# Patient Record
Sex: Female | Born: 1955
Health system: Southern US, Community
[De-identification: ages and names within clinical notes are randomized; demographics above are authoritative.]

## PROBLEM LIST (undated history)

## (undated) DIAGNOSIS — L409 Psoriasis, unspecified: Secondary | ICD-10-CM

## (undated) DIAGNOSIS — R319 Hematuria, unspecified: Secondary | ICD-10-CM

## (undated) DIAGNOSIS — I10 Essential (primary) hypertension: Secondary | ICD-10-CM

## (undated) DIAGNOSIS — I4891 Unspecified atrial fibrillation: Secondary | ICD-10-CM

## (undated) DIAGNOSIS — E669 Obesity, unspecified: Secondary | ICD-10-CM

## (undated) DIAGNOSIS — I499 Cardiac arrhythmia, unspecified: Secondary | ICD-10-CM

## (undated) DIAGNOSIS — C9590 Leukemia, unspecified not having achieved remission: Secondary | ICD-10-CM

## (undated) DIAGNOSIS — N649 Disorder of breast, unspecified: Secondary | ICD-10-CM

## (undated) DIAGNOSIS — E0590 Thyrotoxicosis, unspecified without thyrotoxic crisis or storm: Secondary | ICD-10-CM

## (undated) DIAGNOSIS — C50919 Malignant neoplasm of unspecified site of unspecified female breast: Secondary | ICD-10-CM

## (undated) DIAGNOSIS — R350 Frequency of micturition: Secondary | ICD-10-CM

## (undated) DIAGNOSIS — N950 Postmenopausal bleeding: Secondary | ICD-10-CM

## (undated) HISTORY — DX: Frequency of micturition: R35.0

## (undated) HISTORY — DX: Essential (primary) hypertension: I10

## (undated) HISTORY — DX: Disorder of breast, unspecified: N64.9

## (undated) HISTORY — DX: Malignant neoplasm of unspecified site of unspecified female breast: C50.919

## (undated) HISTORY — DX: Hematuria, unspecified: R31.9

## (undated) HISTORY — DX: Postmenopausal bleeding: N95.0

## (undated) HISTORY — DX: Obesity, unspecified: E66.9

---

## 2005-03-05 DIAGNOSIS — C50919 Malignant neoplasm of unspecified site of unspecified female breast: Secondary | ICD-10-CM

## 2005-03-05 HISTORY — DX: Malignant neoplasm of unspecified site of unspecified female breast: C50.919

## 2006-02-06 ENCOUNTER — Encounter: Admission: RE | Admit: 2006-02-06 | Discharge: 2006-02-06 | Payer: Self-pay | Admitting: General Surgery

## 2006-02-06 ENCOUNTER — Encounter (INDEPENDENT_AMBULATORY_CARE_PROVIDER_SITE_OTHER): Payer: Self-pay | Admitting: Diagnostic Radiology

## 2006-02-06 ENCOUNTER — Encounter (INDEPENDENT_AMBULATORY_CARE_PROVIDER_SITE_OTHER): Payer: Self-pay | Admitting: *Deleted

## 2006-02-13 ENCOUNTER — Ambulatory Visit (HOSPITAL_COMMUNITY): Admission: RE | Admit: 2006-02-13 | Discharge: 2006-02-13 | Payer: Self-pay | Admitting: General Surgery

## 2006-02-20 ENCOUNTER — Encounter (INDEPENDENT_AMBULATORY_CARE_PROVIDER_SITE_OTHER): Payer: Self-pay | Admitting: Specialist

## 2006-02-20 ENCOUNTER — Ambulatory Visit (HOSPITAL_COMMUNITY): Admission: RE | Admit: 2006-02-20 | Discharge: 2006-02-20 | Payer: Self-pay | Admitting: General Surgery

## 2006-02-20 HISTORY — PX: BREAST SURGERY: SHX581

## 2006-03-12 ENCOUNTER — Ambulatory Visit (HOSPITAL_COMMUNITY): Payer: Self-pay | Admitting: Oncology

## 2006-04-03 ENCOUNTER — Encounter: Admission: RE | Admit: 2006-04-03 | Discharge: 2006-04-03 | Payer: Self-pay | Admitting: Radiation Oncology

## 2006-04-05 ENCOUNTER — Ambulatory Visit: Admission: RE | Admit: 2006-04-05 | Discharge: 2006-07-04 | Payer: Self-pay | Admitting: Radiation Oncology

## 2006-06-18 ENCOUNTER — Ambulatory Visit (HOSPITAL_COMMUNITY): Payer: Self-pay | Admitting: Oncology

## 2006-11-27 ENCOUNTER — Other Ambulatory Visit: Admission: RE | Admit: 2006-11-27 | Discharge: 2006-11-27 | Payer: Self-pay | Admitting: Obstetrics and Gynecology

## 2007-01-13 ENCOUNTER — Encounter: Admission: RE | Admit: 2007-01-13 | Discharge: 2007-01-13 | Payer: Self-pay | Admitting: Family Medicine

## 2007-01-20 ENCOUNTER — Ambulatory Visit (HOSPITAL_COMMUNITY): Payer: Self-pay | Admitting: Oncology

## 2008-01-07 ENCOUNTER — Other Ambulatory Visit: Admission: RE | Admit: 2008-01-07 | Discharge: 2008-01-07 | Payer: Self-pay | Admitting: Obstetrics and Gynecology

## 2008-01-15 ENCOUNTER — Encounter: Admission: RE | Admit: 2008-01-15 | Discharge: 2008-01-15 | Payer: Self-pay | Admitting: Family Medicine

## 2008-02-09 ENCOUNTER — Ambulatory Visit (HOSPITAL_COMMUNITY): Payer: Self-pay | Admitting: Oncology

## 2009-01-21 ENCOUNTER — Encounter: Admission: RE | Admit: 2009-01-21 | Discharge: 2009-01-21 | Payer: Self-pay | Admitting: Family Medicine

## 2009-02-08 ENCOUNTER — Ambulatory Visit (HOSPITAL_COMMUNITY): Payer: Self-pay | Admitting: Oncology

## 2009-02-11 ENCOUNTER — Other Ambulatory Visit: Admission: RE | Admit: 2009-02-11 | Discharge: 2009-02-11 | Payer: Self-pay | Admitting: Obstetrics and Gynecology

## 2010-01-27 ENCOUNTER — Encounter: Admission: RE | Admit: 2010-01-27 | Discharge: 2010-01-27 | Payer: Self-pay | Admitting: Family Medicine

## 2010-02-17 ENCOUNTER — Encounter (HOSPITAL_COMMUNITY): Admission: RE | Admit: 2010-02-17 | Payer: Self-pay | Admitting: Oncology

## 2010-02-17 ENCOUNTER — Ambulatory Visit (HOSPITAL_COMMUNITY): Payer: Self-pay | Admitting: Oncology

## 2010-03-31 ENCOUNTER — Other Ambulatory Visit (HOSPITAL_COMMUNITY)
Admission: RE | Admit: 2010-03-31 | Payer: BC Managed Care – PPO | Source: Ambulatory Visit | Admitting: Obstetrics and Gynecology

## 2010-03-31 ENCOUNTER — Other Ambulatory Visit: Payer: Self-pay | Admitting: Adult Health

## 2010-04-07 ENCOUNTER — Other Ambulatory Visit (HOSPITAL_COMMUNITY)
Admission: RE | Admit: 2010-04-07 | Discharge: 2010-04-07 | Disposition: A | Discharge status: Home / Self Care | Payer: BC Managed Care – PPO | Source: Ambulatory Visit | Attending: Obstetrics and Gynecology | Admitting: Obstetrics and Gynecology

## 2010-04-07 DIAGNOSIS — Z01419 Encounter for gynecological examination (general) (routine) without abnormal findings: Secondary | ICD-10-CM | POA: Insufficient documentation

## 2010-07-21 NOTE — Op Note (Signed)
Kaitlyn Hull, Kaitlyn Hull                 ACCOUNT NO.:  [id number]   MEDICAL RECORD NO.:  [id number]          PATIENT TYPE:  AMB   LOCATION:  DAY                           FACILITY:  [facility]   PHYSICIAN:  Dalia Heading, M.D.  DATE OF BIRTH:  17-Jan-1956   DATE OF PROCEDURE:  02/20/2006  DATE OF DISCHARGE:                               OPERATIVE REPORT   PREOPERATIVE DIAGNOSIS:  Left breast carcinoma, ductal carcinoma in  situ.   POSTOPERATIVE DIAGNOSIS:  Left breast carcinoma, ductal carcinoma in  situ.   PROCEDURE:  Left partial mastectomy after needle localization, sentinel  lymph node biopsy, left breast injection.   SURGEON:  Dr. Franky Macho   ANESTHESIA:  General endotracheal.   INDICATIONS:  The patient is a 55 year old white female who has biopsy-  proven left breast carcinoma in the upper, inner quadrant left breast.  A single titanium clip was left to mark the site of the malignancy.  It  was not palpable on examination.  The patient comes to the operating  room for a left partial mastectomy, sentinel lymph node biopsy, possible  axillary dissection.  Risks and benefits of procedure including  bleeding, infection, pain, possibility of needing to re-excise the area  were fully explained to the patient, gave informed consent.   PROCEDURE NOTE:  The patient was placed in supine position.  After  induction of general endotracheal anesthesia, 5 mL of methylene blue was  instilled into the left breast around where the previous needle  localization had been performed.  It was massaged in place for  approximately 5 minutes.  The left breast, axilla were then prepped and  draped in the usual sterile technique with Betadine.  Surgical site  confirmation was performed.   A NeoProbe was used to locate the sentinel lymph node in the left  axilla.  Skin counts were at 450.  An incision was made over this  region.  The in vivo count was in the 400.  An ex vivo count was 1300  with a  blue node present.  Basin count was 8.  This lymph node was  inspected by the pathologist and touch prep was negative for malignancy.  Any bleeding was controlled using small clips.  The subcutaneous layer  was reapproximated using a 4-0 Vicryl interrupted suture.  Skin was  closed using a 4-0 Vicryl subcuticular suture.  0.5% Sensorcaine was  instilled in the surrounding wound.  Dermabond was then applied.   Next, a left partial mastectomy was performed.  An incision was made in  the area of the needle localization wire.  This was taken down to the  mass and a large portion of the upper, inner quadrant was excised.  The  specimen was sent for specimen radiography.  The titanium clip as well  as tumor was noted to be within the specimen removed.  This was then  sent to pathology for further examination.  Any bleeding was controlled  using Bovie electrocautery.  The subcutaneous layer was reapproximated  using a 4-0 Vicryl interrupted suture.  Skin was closed using a  4-0  Vicryl subcuticular suture.  0.5% Sensorcaine was instilled in the  surrounding wound.  Dermabond was then applied.   All tape and needle counts correct at the end of the procedures.  The  patient was extubated in the operating room and went back to recovery  room awake in stable condition.  Complications none.   SPECIMEN:  1. Sentinel lymph node.  2. Left breast tissue.   BLOOD LOSS:  Less than 50 mL      Dalia Heading, M.D.  Electronically Signed     MAJ/MEDQ  D:  02/20/2006  T:  02/20/2006  Job:  [id number]   cc:   Tilda Burrow, M.D.  Fax: [phone]   Wyvonnia Lora  Fax: [phone]

## 2010-07-21 NOTE — H&P (Signed)
NAME:  Kaitlyn Hull, Kaitlyn Hull                 ACCOUNT NO.:  [id number]   MEDICAL RECORD NO.:  [id number]          PATIENT TYPE:  AMB   LOCATION:  DAY                           FACILITY:  [facility]   PHYSICIAN:  Dalia Heading, M.D.  DATE OF BIRTH:  1955-11-05   DATE OF ADMISSION:  DATE OF DISCHARGE:  LH                              HISTORY & PHYSICAL   CHIEF COMPLAINT:  Ductal carcinoma in situ of left breast.   HISTORY OF PRESENT ILLNESS:  The patient is a 55 year old white female  status post multiple breast biopsies in the past who was found to have  microcalcifications on routine mammography.  A biopsy of this reveals  ductal carcinoma in situ.  MRI of the breast reveals a 1.6 x 2.4 x 3 cm  in the upper, inner quadrant.  There is no history of nipple discharge  or family history of breast carcinoma.   PAST MEDICAL HISTORY:  1. Hypertension.  2. Non-insulin-dependent diabetes mellitus.   PAST SURGICAL HISTORY:  1. C-section.  2. Multiple breast biopsies.   CURRENT MEDICATIONS:  1. Blood pressure pill.  2. Diabetic pill.   ALLERGIES:  NO KNOWN DRUG ALLERGIES.   REVIEW OF SYSTEMS:  Noncontributory.   PHYSICAL EXAMINATION:  GENERAL:  The patient is a well-developed, well-  nourished white female in no acute distress.  NECK:  Supple without lymphadenopathy.  LUNGS:  Clear to auscultation with equal breath sounds bilaterally.  HEART:  Examination reveals regular rate and rhythm without history, S4,  murmurs.  BREASTS:  Right breast examination reveals no dominant mass, nipple  discharge, dimpling.  The axilla is negative for palpable nodes.  Left  breast examination reveals no dominant mass, nipple discharge, dimpling.  The axilla is negative for palpable nodes.   IMPRESSION:  Ductal carcinoma in situ, left breast.   PLAN:  The patient is scheduled for a left partial mastectomy after  needle localization, sentinel lymph node biopsy, possible axillary  dissection on February 20, 2006.   Risks and benefits of the procedure  including bleeding, infection, arm swelling, and the possibility of  needing re-excision of the breast were fully explained to the patient,  gave informed consent.      Dalia Heading, M.D.  Electronically Signed     MAJ/MEDQ  D:  02/14/2006  T:  02/15/2006  Job:  [id number]   cc:   Short Stay at [facility]   Tilda Burrow, M.D.  Fax: [phone]   Wyvonnia Lora  Fax: [phone]

## 2011-01-18 ENCOUNTER — Other Ambulatory Visit: Payer: Self-pay | Admitting: Family Medicine

## 2011-01-18 DIAGNOSIS — Z9889 Other specified postprocedural states: Secondary | ICD-10-CM

## 2011-02-02 ENCOUNTER — Ambulatory Visit
Admission: RE | Admit: 2011-02-02 | Discharge: 2011-02-02 | Disposition: A | Discharge status: Home / Self Care | Payer: BC Managed Care – PPO | Source: Ambulatory Visit | Attending: Family Medicine | Admitting: Family Medicine

## 2011-02-02 DIAGNOSIS — Z9889 Other specified postprocedural states: Secondary | ICD-10-CM

## 2011-04-03 ENCOUNTER — Other Ambulatory Visit: Payer: Self-pay | Admitting: Adult Health

## 2011-04-03 ENCOUNTER — Other Ambulatory Visit (HOSPITAL_COMMUNITY)
Admission: RE | Admit: 2011-04-03 | Discharge: 2011-04-03 | Disposition: A | Discharge status: Home / Self Care | Payer: 59 | Source: Ambulatory Visit | Attending: Obstetrics and Gynecology | Admitting: Obstetrics and Gynecology

## 2011-04-03 DIAGNOSIS — Z01419 Encounter for gynecological examination (general) (routine) without abnormal findings: Secondary | ICD-10-CM | POA: Insufficient documentation

## 2011-10-02 ENCOUNTER — Emergency Department (HOSPITAL_COMMUNITY)
Admission: EM | Admit: 2011-10-02 | Discharge: 2011-10-02 | Disposition: A | Discharge status: Home / Self Care | Payer: 59 | Attending: Emergency Medicine | Admitting: Emergency Medicine

## 2011-10-02 ENCOUNTER — Emergency Department (HOSPITAL_COMMUNITY): Payer: 59

## 2011-10-02 ENCOUNTER — Encounter (HOSPITAL_COMMUNITY): Payer: Self-pay

## 2011-10-02 DIAGNOSIS — R10811 Right upper quadrant abdominal tenderness: Secondary | ICD-10-CM | POA: Insufficient documentation

## 2011-10-02 DIAGNOSIS — E119 Type 2 diabetes mellitus without complications: Secondary | ICD-10-CM | POA: Insufficient documentation

## 2011-10-02 DIAGNOSIS — R109 Unspecified abdominal pain: Secondary | ICD-10-CM | POA: Insufficient documentation

## 2011-10-02 LAB — URINALYSIS, ROUTINE W REFLEX MICROSCOPIC
Glucose, UA: NEGATIVE mg/dL
Leukocytes, UA: NEGATIVE
Protein, ur: NEGATIVE mg/dL
Specific Gravity, Urine: 1.01 (ref 1.005–1.030)
pH: 7 (ref 5.0–8.0)

## 2011-10-02 LAB — COMPREHENSIVE METABOLIC PANEL
ALT: 30 U/L (ref 0–35)
AST: 44 U/L — ABNORMAL HIGH (ref 0–37)
CO2: 26 mEq/L (ref 19–32)
Chloride: 102 mEq/L (ref 96–112)
Creatinine, Ser: 0.8 mg/dL (ref 0.50–1.10)
GFR calc non Af Amer: 82 mL/min — ABNORMAL LOW (ref >90)
Sodium: 139 mEq/L (ref 135–145)
Total Bilirubin: 0.9 mg/dL (ref 0.3–1.2)

## 2011-10-02 LAB — CBC WITH DIFFERENTIAL/PLATELET
Basophils Absolute: 0 10*3/uL (ref 0.0–0.1)
HCT: 38.6 % (ref 36.0–46.0)
Lymphocytes Relative: 20 % (ref 12–46)
Monocytes Absolute: 0.7 10*3/uL (ref 0.1–1.0)
Neutro Abs: 6.9 10*3/uL (ref 1.7–7.7)
RBC: 4.26 MIL/uL (ref 3.87–5.11)
RDW: 14 % (ref 11.5–15.5)
WBC: 9.7 10*3/uL (ref 4.0–10.5)

## 2011-10-02 LAB — PREGNANCY, URINE: Preg Test, Ur: NEGATIVE

## 2011-10-02 MED ORDER — ONDANSETRON HCL 4 MG/2ML IJ SOLN
4.0000 mg | Freq: Once | INTRAMUSCULAR | Status: AC
  Administered 2011-10-02: 4 mg via INTRAVENOUS
  Filled 2011-10-02: qty 2

## 2011-10-02 MED ORDER — HYDROMORPHONE HCL PF 1 MG/ML IJ SOLN
1.0000 mg | Freq: Once | INTRAMUSCULAR | Status: AC
  Administered 2011-10-02: 1 mg via INTRAVENOUS
  Filled 2011-10-02: qty 1

## 2011-10-02 MED ORDER — OXYCODONE-ACETAMINOPHEN 5-325 MG PO TABS: 1.0000 | ORAL_TABLET | ORAL | Status: AC | PRN

## 2011-10-02 NOTE — ED Notes (Signed)
Patient transported to Ultrasound 

## 2011-10-02 NOTE — ED Provider Notes (Signed)
History  This chart was scribed for Joya Gaskins, MD by Bennett Scrape. This patient was seen in room [facility]08/[facility]08 and the patient's care was started at 10:53AM.  CSN: [id number]  Arrival date & time 10/02/11  1034   First MD Initiated Contact with Patient 10/02/11 1053      Chief Complaint  Patient presents with  . Abdominal Pain     Patient is a 56 y.o. female presenting with abdominal pain. The history is provided by the patient. No language interpreter was used.  Abdominal Pain The primary symptoms of the illness include abdominal pain. The primary symptoms of the illness do not include fever, shortness of breath, nausea, vomiting, diarrhea, dysuria or vaginal bleeding. The current episode started 13 to 24 hours ago. The onset of the illness was sudden. The problem has been gradually worsening.  The abdominal pain is located in the RUQ. The abdominal pain does not radiate.  Symptoms associated with the illness do not include chills or back pain. Significant associated medical issues do not include GERD, inflammatory bowel disease or diverticulitis.     Kaitlyn Hull is a 56 y.o. female who presents to the Emergency Department complaining of 24 hours of sudden onset, gradually worsening, constant RUQ abdominal pain described as sharp. The pain is non-radiating and she denies having any modifying factors. Pt denies having prior episodes of similar symptoms.She denies difficulty urinating, nausea, emesis, fevers, vaginal bleeding, weakness and numbness as associated symptoms. She denies having menstrual cycles due to having completed menapause. She has a h/o DM. She denies smoking and alcohol use.   Past Medical History  Diagnosis Date  . Diabetes mellitus     Type II    Past Surgical History  Procedure Date  . Breast surgery-lumpectomy       left side     Family History  Problem Relation Age of Onset  . Heart failure Father   . Cancer Sister     History  Substance  Use Topics  . Smoking status: Never Smoker   . Smokeless tobacco: Never Used  . Alcohol Use: No      Review of Systems  Constitutional: Negative for fever and chills.  Respiratory: Negative for cough and shortness of breath.   Gastrointestinal: Positive for abdominal pain. Negative for nausea, vomiting and diarrhea.  Genitourinary: Negative for dysuria, vaginal bleeding and difficulty urinating.  Musculoskeletal: Negative for back pain.  Neurological: Negative for weakness and numbness.  All other systems reviewed and are negative.    Allergies  Review of patient's allergies indicates no known allergies.  Home Medications   Current Outpatient Rx  Name Route Sig Dispense Refill  . ASPIRIN EC 81 MG PO TBEC Oral Take 81 mg by mouth at bedtime.    Marland Kitchen METFORMIN HCL ER 500 MG PO TB24 Oral Take 500 mg by mouth 2 (two) times daily.    Marland Kitchen PRAVASTATIN SODIUM 40 MG PO TABS Oral Take 40 mg by mouth at bedtime.    . TRIAMCINOLONE ACETONIDE 0.1 % EX CREA Topical Apply 1 application topically 2 (two) times daily as needed. For Psoriasis    . TRIAMTERENE-HCTZ 37.5-25 MG PO TABS Oral Take 1 tablet by mouth every morning.      Triage Vitals: BP 130/73  Pulse 106  Temp 98.3 F (36.8 C) (Oral)  Resp 18  SpO2 94%  Physical Exam  Nursing note and vitals reviewed.  CONSTITUTIONAL: Well developed/well nourished HEAD AND FACE: Normocephalic/atraumatic EYES: EOMI/PERRL ENMT: Mucous  membranes moist NECK: supple no meningeal signs SPINE:entire spine nontender CV: S1/S2 noted, no murmurs/rubs/gallops noted LUNGS: Lungs are clear to auscultation bilaterally, no apparent distress ABDOMEN: soft, RUQ tenderness, no rebound or guarding.  She is obese GU:no cva tenderness NEURO: Pt is awake/alert, moves all extremitiesx4 EXTREMITIES: pulses normal, full ROM SKIN: warm, color normal PSYCH: no abnormalities of mood noted  ED Course  Procedures   DIAGNOSTIC STUDIES: Oxygen Saturation is 94% on  room air, adequate by my interpretation.    COORDINATION OF CARE: 11:13AM-Discussed treatment plan which includes urinalysis and blood work with pt at bedside and pt agreed to plan.   2:08 PM Pt with continued RUQ tenderness, US imaging ordered  3:18 PM Pt improved.  She is taking PO.  Vitals improved.   Her pain appeared only in RUQ.  At this time, suspicion for acute appendicitis is low.  I doubt acute gynecologic emergency.  However, I advised to return if her symptoms should worsen including:  The pain does not go away or becomes severe, particularly over the next 8-12 hours.  A temperature above 100.78F develops.  Repeated vomiting occurs (multiple episodes).  The pain becomes localized to portions of the abdomen. The right side could possibly be appendicitis.   Patient agreeable with this plan.  She is nontoxic/well appearing.   Advised need for f/u MRI/CT scan of the liver abnormality, pt agreeable with this plan and she has PCP who can monitor this as outpatient    MDM  Nursing notes including past medical history and social history reviewed and considered in documentation Labs/vital reviewed and considered     Date: 10/02/2011  Rate: 93  Rhythm: normal sinus rhythm  QRS Axis: normal  Intervals: normal  ST/T Wave abnormalities: nonspecific ST changes  Conduction Disutrbances:none      I personally performed the services described in this documentation, which was scribed in my presence. The recorded information has been reviewed and considered.       Joya Gaskins, MD 10/02/11 [phone]

## 2011-10-02 NOTE — ED Notes (Signed)
Discharge instructions reviewed.

## 2011-10-02 NOTE — ED Notes (Signed)
Pt c/o later right sided abd pain x 1 day. States pain is localized. Pt says she couldn't sleep well last night and that the pain is getting progressively worse. Pt denies difficulty urinating. Last BM this am normal.

## 2012-01-07 ENCOUNTER — Other Ambulatory Visit: Payer: Self-pay | Admitting: Family Medicine

## 2012-01-07 DIAGNOSIS — Z853 Personal history of malignant neoplasm of breast: Secondary | ICD-10-CM

## 2012-01-07 DIAGNOSIS — Z9889 Other specified postprocedural states: Secondary | ICD-10-CM

## 2012-02-08 ENCOUNTER — Ambulatory Visit
Admission: RE | Admit: 2012-02-08 | Discharge: 2012-02-08 | Disposition: A | Discharge status: Home / Self Care | Payer: 59 | Source: Ambulatory Visit | Attending: Family Medicine | Admitting: Family Medicine

## 2012-02-08 DIAGNOSIS — Z853 Personal history of malignant neoplasm of breast: Secondary | ICD-10-CM

## 2012-02-08 DIAGNOSIS — Z9889 Other specified postprocedural states: Secondary | ICD-10-CM

## 2012-07-22 ENCOUNTER — Telehealth: Payer: Self-pay | Admitting: Adult Health

## 2012-07-22 NOTE — Telephone Encounter (Signed)
Has had bleeding, will see Thursday as scheduled

## 2012-07-22 NOTE — Telephone Encounter (Signed)
Pt states stopped having periods x 7 years ago due to radiation for breast cancer, started bleeding heavy at times x 2 weeks ago. Pt has an appointment in July for physical. Appointment made for pt to see Cyril Mourning, NP on Thursday at 2:45.

## 2012-07-23 ENCOUNTER — Ambulatory Visit: Payer: Self-pay | Admitting: Adult Health

## 2012-07-23 ENCOUNTER — Encounter: Payer: Self-pay | Admitting: *Deleted

## 2012-07-24 ENCOUNTER — Encounter: Payer: Self-pay | Admitting: Obstetrics and Gynecology

## 2012-07-24 ENCOUNTER — Other Ambulatory Visit: Payer: Self-pay | Admitting: Adult Health

## 2012-07-24 ENCOUNTER — Ambulatory Visit (INDEPENDENT_AMBULATORY_CARE_PROVIDER_SITE_OTHER): Payer: 59

## 2012-07-24 ENCOUNTER — Encounter: Payer: Self-pay | Admitting: Obstetrics & Gynecology

## 2012-07-24 ENCOUNTER — Encounter: Payer: Self-pay | Admitting: Adult Health

## 2012-07-24 ENCOUNTER — Ambulatory Visit (INDEPENDENT_AMBULATORY_CARE_PROVIDER_SITE_OTHER): Payer: 59 | Admitting: Adult Health

## 2012-07-24 VITALS — BP 140/80 | Ht 62.0 in | Wt 324.0 lb

## 2012-07-24 DIAGNOSIS — Z853 Personal history of malignant neoplasm of breast: Secondary | ICD-10-CM

## 2012-07-24 DIAGNOSIS — N950 Postmenopausal bleeding: Secondary | ICD-10-CM

## 2012-07-24 DIAGNOSIS — E669 Obesity, unspecified: Secondary | ICD-10-CM

## 2012-07-24 DIAGNOSIS — E119 Type 2 diabetes mellitus without complications: Secondary | ICD-10-CM

## 2012-07-24 DIAGNOSIS — I10 Essential (primary) hypertension: Secondary | ICD-10-CM | POA: Insufficient documentation

## 2012-07-24 HISTORY — DX: Postmenopausal bleeding: N95.0

## 2012-07-24 NOTE — Patient Instructions (Addendum)
Review endo bx handout Return 5 days for endo bx

## 2012-07-24 NOTE — Progress Notes (Signed)
Subjective:     Patient ID: Kaitlyn Hull, female   DOB: 1955/10/01, 57 y.o.   MRN: [id number]  HPI Kaitlyn Hull is a 57 year old white female in complaining of vaginal bleeding x 2 weeks, heavier since Tuesday. She has a history of breast cancer.  Review of Systems Positives as in HPI Reviewed past medical,surgical, social and family history. Reviewed medications and allergies.     Objective:   Physical Exam Blood pressure 140/80, height 5\' 2"  (1.575 m), weight 324 lb (146.965 kg).   Skin warm and dry.Pelvic: external genitalia is normal in appearance, vagina: + blood, cervix:smooth and bulbous, uterus: normal size, shape and contour, non tender, no masses felt, adnexa: no masses or tenderness noted. Difficult exam secondary to abdominal girth. Will get pelvic US today, and it showed 16.102mm thickened endometrium and fibroids. Will get endo bx asap. Assessment:      PMB with thickened endometrium History of breast cancer Obesity Diabetes Hypertension     Plan:      Endometrial biopsy 5/27 at 4 pm with Dr. Despina Hidden Review endo bx handout and call with any questions

## 2012-07-29 ENCOUNTER — Other Ambulatory Visit: Payer: 59 | Admitting: Obstetrics & Gynecology

## 2012-07-30 ENCOUNTER — Other Ambulatory Visit: Payer: Self-pay | Admitting: Obstetrics & Gynecology

## 2012-07-30 ENCOUNTER — Ambulatory Visit (INDEPENDENT_AMBULATORY_CARE_PROVIDER_SITE_OTHER): Payer: 59 | Admitting: Obstetrics & Gynecology

## 2012-07-30 ENCOUNTER — Encounter: Payer: Self-pay | Admitting: Obstetrics & Gynecology

## 2012-07-30 VITALS — BP 110/80 | Ht 62.0 in | Wt 324.0 lb

## 2012-07-30 DIAGNOSIS — N950 Postmenopausal bleeding: Secondary | ICD-10-CM

## 2012-07-30 DIAGNOSIS — Z853 Personal history of malignant neoplasm of breast: Secondary | ICD-10-CM

## 2012-07-30 DIAGNOSIS — R9389 Abnormal findings on diagnostic imaging of other specified body structures: Secondary | ICD-10-CM

## 2012-07-30 DIAGNOSIS — D259 Leiomyoma of uterus, unspecified: Secondary | ICD-10-CM

## 2012-07-30 DIAGNOSIS — C541 Malignant neoplasm of endometrium: Secondary | ICD-10-CM

## 2012-07-30 NOTE — Progress Notes (Signed)
Patient ID: Kaitlyn Hull, female   DOB: [birth year], 56 y.o.   MRN: [id number] Patient with no vaginal bleeding for several years then began. Sonogram measure endometrium at 16.1 mm.  Here for endometrial biopsy tody and patient aware of the indications. If endometrial cancer is found no formal uterine curettage needed but if not will need hysteroscopy and uterine curettage for more thorough endometrial evluation given the endometrial measurement.  Patient aware of the clinical situation and indications as listed bove.  Endometrial biopsy performed using 3mm pipelle without difficulty.  Tissue sent to pathology.  Follow up 1 week.

## 2012-08-06 ENCOUNTER — Encounter: Payer: Self-pay | Admitting: Obstetrics & Gynecology

## 2012-08-06 ENCOUNTER — Ambulatory Visit (INDEPENDENT_AMBULATORY_CARE_PROVIDER_SITE_OTHER): Payer: 59 | Admitting: Obstetrics & Gynecology

## 2012-08-06 VITALS — BP 104/78 | Wt 323.0 lb

## 2012-08-06 DIAGNOSIS — N950 Postmenopausal bleeding: Secondary | ICD-10-CM

## 2012-08-06 DIAGNOSIS — D259 Leiomyoma of uterus, unspecified: Secondary | ICD-10-CM

## 2012-08-06 NOTE — Progress Notes (Signed)
Patient ID: Kaitlyn Hull, female   DOB: 11/27/1955, 58 y.o.   MRN: [id number] Endometrial biopsy is negative.  Her ES is 16 mm which will require a formal hysteroscopic evaluation with uterine curettage  Will plan for that on 6.11.2014  [name] H

## 2012-08-07 ENCOUNTER — Encounter (HOSPITAL_COMMUNITY): Payer: Self-pay | Admitting: Pharmacy Technician

## 2012-08-08 ENCOUNTER — Encounter (HOSPITAL_COMMUNITY): Payer: Self-pay

## 2012-08-08 ENCOUNTER — Ambulatory Visit: Payer: 59 | Admitting: Obstetrics & Gynecology

## 2012-08-08 ENCOUNTER — Encounter (HOSPITAL_COMMUNITY)
Admission: RE | Admit: 2012-08-08 | Discharge: 2012-08-08 | Disposition: A | Discharge status: Home / Self Care | Payer: 59 | Source: Ambulatory Visit | Attending: Obstetrics & Gynecology | Admitting: Obstetrics & Gynecology

## 2012-08-08 HISTORY — DX: Psoriasis, unspecified: L40.9

## 2012-08-08 LAB — COMPREHENSIVE METABOLIC PANEL
ALT: 25 U/L (ref 0–35)
BUN: 12 mg/dL (ref 6–23)
CO2: 26 mEq/L (ref 19–32)
Calcium: 9.6 mg/dL (ref 8.4–10.5)
Creatinine, Ser: 0.92 mg/dL (ref 0.50–1.10)
GFR calc Af Amer: 80 mL/min — ABNORMAL LOW (ref >90)
GFR calc non Af Amer: 69 mL/min — ABNORMAL LOW (ref >90)
Glucose, Bld: 126 mg/dL — ABNORMAL HIGH (ref 70–99)
Sodium: 140 mEq/L (ref 135–145)

## 2012-08-08 LAB — CBC
HCT: 39.1 % (ref 36.0–46.0)
Hemoglobin: 12.8 g/dL (ref 12.0–15.0)
MCH: 29.9 pg (ref 26.0–34.0)
MCHC: 32.7 g/dL (ref 30.0–36.0)
MCV: 91.4 fL (ref 78.0–100.0)
RBC: 4.28 MIL/uL (ref 3.87–5.11)

## 2012-08-08 NOTE — Patient Instructions (Addendum)
Kaitlyn Hull  08/08/2012   Your procedure is scheduled on:  Wednesday, 08/13/12  [facility] to [facility] [place] [facility].  Call this number if you have problems the morning of surgery: [phone]   Remember:   Do not eat food or drink liquids after midnight.   Take these medicines the morning of surgery with A SIP OF WATER: lisinopril   Do not wear jewelry, make-up or nail polish.  Do not wear lotions, powders, or perfumes. You may wear deodorant.  Do not shave 48 hours prior to surgery. Men may shave face and neck.  Do not bring valuables to the hospital.  [facility] is not responsible                   for any belongings or valuables.  Contacts, dentures or bridgework may not be worn into surgery.  Leave suitcase in the car. After surgery it may be brought to your room.  For patients admitted to the hospital, checkout time is 11:00 AM the day of  discharge.   Patients discharged the day of surgery will not be allowed to drive  home.  Name and phone number of your driver: family  Special Instructions: Shower using CHG 2 nights before surgery and the night before surgery.  If you shower the day of surgery use CHG.  Use special wash - you have one bottle of CHG for all showers.  You should use approximately 1/3 of the bottle for each shower.   Please read over the following fact sheets that you were given: Pain Booklet, MRSA Information, Surgical Site Infection Prevention, Anesthesia Post-op Instructions and Care and Recovery After Surgery   PATIENT INSTRUCTIONS POST-ANESTHESIA  IMMEDIATELY FOLLOWING SURGERY:  Do not drive or operate machinery for the first twenty four hours after surgery.  Do not make any important decisions for twenty four hours after surgery or while taking narcotic pain medications or sedatives.  If you develop intractable nausea and vomiting or a severe headache please notify your doctor immediately.  FOLLOW-UP:  Please make an appointment with your surgeon as instructed.  You do not need to follow up with anesthesia unless specifically instructed to do so.  WOUND CARE INSTRUCTIONS (if applicable):  Keep a dry clean dressing on the anesthesia/puncture wound site if there is drainage.  Once the wound has quit draining you may leave it open to air.  Generally you should leave the bandage intact for twenty four hours unless there is drainage.  If the epidural site drains for more than 36-48 hours please call the anesthesia department.  QUESTIONS?:  Please feel free to call your physician or the hospital operator if you have any questions, and they will be happy to assist you.      Dilation and Curettage or Vacuum Curettage Care After These instructions give you information on caring for yourself after your procedure. Your doctor may also give you more specific instructions. Call your doctor if you have any problems or questions after your procedure. HOME CARE  Do not drive for 24 hours.  Return to normal activities in 1 week.  Take your temperature 2 times a day for 4 days. Write it down. Tell your doctor if you have a fever.  Do not stand for a long time.  Do not lift, push, or pull anything over 10 pounds (4.5 kilograms).  Limit stair climbing to once or twice a day.  Rest often.  Continue with your usual diet.  Drink  enough fluids to keep your pee (urine) clear or pale yellow.  If you have dry, hard poop (constipation), you may:  Take a medicine to help you go poop (laxative) as told by your doctor.  Add fruit and bran to your diet.  Take showers, not baths, for as long as told by your doctor.  Do not swim or use a hot tub until your doctor says it is okay.  Have someone with you for 2 to 4 days after the procedure.  Do not douche, use tampons, or have sex (intercourse) until your doctor says it is okay.  Only take medicines as told by your doctor. Do not take aspirin. It can cause bleeding.  Keep all doctor visits. GET HELP RIGHT AWAY  IF:   You start to bleed more than a regular period.  You have a fever.  You have chest pain.  You have trouble breathing.  You feel dizzy or feel like passing out (fainting).  You pass out.  You have pain in the tops of your shoulders.  You have vaginal bleeding with or without clumps (clots) of blood.  You have cramps or pain not helped by medicine.  You have belly (abdominal) pain.  You have a bad smelling fluid coming from your vagina.  You get a rash.  You have problems with any medicine. MAKE SURE YOU:   Understand these instructions.  Will watch your condition.  Will get help right away if you are not doing well or get worse. Document Released: 11/29/2007 Document Revised: 05/14/2011 Document Reviewed: 11/29/2007 [facility] Patient Information 2014 [place], [place]. Dilation and Curettage or Vacuum Curettage Dilation and curettage (D&C) and vacuum curettage are minor procedures. A D&C involves stretching (dilation) the cervix and scraping (curettage) the inside lining of the womb (uterus). During a D&C, tissue is gently scraped from the inside lining of the uterus. During a vacuum curettage, the lining and tissue in the uterus are removed with the use of gentle suction. Curettage may be performed for diagnostic or therapeutic purposes. As a diagnostic procedure, curettage is performed for the purpose of examining tissues from the uterus. Tissue examination may help determine causes or treatment options for symptoms. A diagnostic curettage may be performed for the following symptoms:  Irregular bleeding in the uterus.  Bleeding with the development of clots.  Spotting between menstrual periods.  Prolonged menstrual periods.  Bleeding after menopause.  No menstrual period (amenorrhea).  A change in size and shape of the uterus. A therapeutic curettage is performed to remove tissue, blood, or a contraceptive device. Therapeutic curettage may be performed for the  following conditions:   Removal of an IUD (intrauterine device).  Removal of retained placenta after giving birth. Retained placenta can cause bleeding severe enough to require transfusions or an infection.  Abortion.  Miscarriage.  Removal of polyps inside the uterus.  Removal of uncommon types of fibroids (noncancerous lumps). LET YOUR CAREGIVER KNOW ABOUT:   Allergies to food or medicine.  Medicines taken, including vitamins, herbs, eyedrops, over-the-counter medicines, and creams.  Use of steroids (by mouth or creams).  Previous problems with anesthetics or numbing medicines.  History of bleeding problems or blood clots.  Previous surgery.  Other health problems, including diabetes and kidney problems.  Possibility of pregnancy, if this applies. RISKS AND COMPLICATIONS   Excessive bleeding.  Infection of the uterus.  Damage to the cervix.  Development of scar tissue (adhesions) inside the uterus, later causing abnormal amounts of menstrual bleeding.  Complications from the general anesthetic, if a general anesthetic is used.  Putting a hole (perforation) in the uterus. This is rare. BEFORE THE PROCEDURE   Eat and drink before the procedure only as directed by your caregiver.  Arrange for someone to take you home. PROCEDURE   This procedure may be done in a hospital, outpatient clinic, or caregiver's office.  You may be given a general anesthetic or a local anesthetic in and around the cervix.  You will lie on your back with your legs in stirrups.  There are two ways in which your cervix can be softened and dilated. These include:  Taking a medicine.  Having thin rods (laminaria) inserted into your cervix.  A curved tool (curette) will scrape cells from the inside lining of the uterus and will then be removed. This procedure usually takes about 15 to 30 minutes. AFTER THE PROCEDURE   You will rest in the recovery area until you are stable and are  ready to go home.  You will need to have someone take you home.  You may feel sick to your stomach (nauseous) or throw up (vomit) if you had general anesthesia.  You may have a sore throat if a tube was placed in your throat during general anesthesia.  You may have light cramping and bleeding for 2 days to 2 weeks after the procedure.  Your uterus needs to make a new lining after the procedure. This may make your next period late. Document Released: 02/19/2005 Document Revised: 05/14/2011 Document Reviewed: 09/17/2008 [facility] Patient Information 2014 [place], [place].

## 2012-08-08 NOTE — Progress Notes (Signed)
08/08/12 1351  OBSTRUCTIVE SLEEP APNEA  Have you ever been diagnosed with sleep apnea through a sleep study? No  Do you snore loudly (loud enough to be heard through closed doors)?  0  Do you often feel tired, fatigued, or sleepy during the daytime? 0  Has anyone observed you stop breathing during your sleep? 0  Do you have, or are you being treated for high blood pressure? 1  BMI more than 35 kg/m2? 1  Age over 56 years old? 1  Neck circumference greater than 40 cm/18 inches? 1  Gender: 0  Obstructive Sleep Apnea Score 4  Score 4 or greater  Results sent to PCP

## 2012-08-13 ENCOUNTER — Ambulatory Visit (HOSPITAL_COMMUNITY)
Admission: RE | Admit: 2012-08-13 | Discharge: 2012-08-13 | Disposition: A | Discharge status: Home / Self Care | Payer: 59 | Source: Ambulatory Visit | Attending: Obstetrics & Gynecology | Admitting: Obstetrics & Gynecology

## 2012-08-13 ENCOUNTER — Ambulatory Visit (HOSPITAL_COMMUNITY): Payer: 59 | Admitting: Anesthesiology

## 2012-08-13 ENCOUNTER — Encounter (HOSPITAL_COMMUNITY)
Admission: RE | Disposition: A | Discharge status: Home / Self Care | Payer: Self-pay | Source: Ambulatory Visit | Attending: Obstetrics & Gynecology

## 2012-08-13 ENCOUNTER — Encounter (HOSPITAL_COMMUNITY): Payer: Self-pay | Admitting: *Deleted

## 2012-08-13 ENCOUNTER — Encounter (HOSPITAL_COMMUNITY): Payer: Self-pay | Admitting: Anesthesiology

## 2012-08-13 DIAGNOSIS — R9389 Abnormal findings on diagnostic imaging of other specified body structures: Secondary | ICD-10-CM

## 2012-08-13 DIAGNOSIS — E119 Type 2 diabetes mellitus without complications: Secondary | ICD-10-CM | POA: Insufficient documentation

## 2012-08-13 DIAGNOSIS — I10 Essential (primary) hypertension: Secondary | ICD-10-CM | POA: Insufficient documentation

## 2012-08-13 DIAGNOSIS — Z6841 Body Mass Index (BMI) 40.0 and over, adult: Secondary | ICD-10-CM | POA: Insufficient documentation

## 2012-08-13 DIAGNOSIS — N950 Postmenopausal bleeding: Secondary | ICD-10-CM | POA: Insufficient documentation

## 2012-08-13 DIAGNOSIS — Z01812 Encounter for preprocedural laboratory examination: Secondary | ICD-10-CM | POA: Insufficient documentation

## 2012-08-13 HISTORY — PX: HYSTEROSCOPY WITH D & C: SHX1775

## 2012-08-13 LAB — GLUCOSE, CAPILLARY
Glucose-Capillary: 142 mg/dL — ABNORMAL HIGH (ref 70–99)
Glucose-Capillary: 127 mg/dL — ABNORMAL HIGH (ref 70–99)

## 2012-08-13 SURGERY — DILATATION AND CURETTAGE /HYSTEROSCOPY
Anesthesia: General | Site: Vagina | Wound class: Clean Contaminated

## 2012-08-13 MED ORDER — ROCURONIUM BROMIDE 100 MG/10ML IV SOLN
INTRAVENOUS | Status: DC | PRN
  Administered 2012-08-13: 20 mg via INTRAVENOUS

## 2012-08-13 MED ORDER — FENTANYL CITRATE 0.05 MG/ML IJ SOLN
INTRAMUSCULAR | Status: AC
  Filled 2012-08-13: qty 2

## 2012-08-13 MED ORDER — SUCCINYLCHOLINE CHLORIDE 20 MG/ML IJ SOLN
INTRAMUSCULAR | Status: AC
  Filled 2012-08-13: qty 1

## 2012-08-13 MED ORDER — CEFAZOLIN SODIUM 1-5 GM-% IV SOLN
INTRAVENOUS | Status: AC
  Filled 2012-08-13: qty 50

## 2012-08-13 MED ORDER — EPHEDRINE SULFATE 50 MG/ML IJ SOLN
INTRAMUSCULAR | Status: DC | PRN
  Administered 2012-08-13: 10 mg via INTRAVENOUS

## 2012-08-13 MED ORDER — GLYCOPYRROLATE 0.2 MG/ML IJ SOLN
INTRAMUSCULAR | Status: AC
  Filled 2012-08-13: qty 1

## 2012-08-13 MED ORDER — ONDANSETRON HCL 4 MG/2ML IJ SOLN: 4.0000 mg | Freq: Once | INTRAMUSCULAR | Status: DC | PRN

## 2012-08-13 MED ORDER — LIDOCAINE HCL 1 % IJ SOLN
INTRAMUSCULAR | Status: DC | PRN
  Administered 2012-08-13: 50 mg via INTRADERMAL

## 2012-08-13 MED ORDER — 0.9 % SODIUM CHLORIDE (POUR BTL) OPTIME
TOPICAL | Status: DC | PRN
  Administered 2012-08-13: 1000 mL

## 2012-08-13 MED ORDER — GLYCOPYRROLATE 0.2 MG/ML IJ SOLN
0.2000 mg | Freq: Once | INTRAMUSCULAR | Status: AC
  Administered 2012-08-13: 0.2 mg via INTRAVENOUS

## 2012-08-13 MED ORDER — PROPOFOL 10 MG/ML IV EMUL
INTRAVENOUS | Status: AC
  Filled 2012-08-13: qty 20

## 2012-08-13 MED ORDER — ONDANSETRON HCL 4 MG/2ML IJ SOLN
4.0000 mg | Freq: Once | INTRAMUSCULAR | Status: AC
  Administered 2012-08-13: 4 mg via INTRAVENOUS

## 2012-08-13 MED ORDER — ONDANSETRON HCL 8 MG PO TABS: 8.0000 mg | ORAL_TABLET | Freq: Three times a day (TID) | ORAL | Status: DC | PRN

## 2012-08-13 MED ORDER — CEFAZOLIN SODIUM-DEXTROSE 2-3 GM-% IV SOLR: 2.0000 g | Freq: Once | INTRAVENOUS | Status: DC

## 2012-08-13 MED ORDER — KETOROLAC TROMETHAMINE 30 MG/ML IJ SOLN
INTRAMUSCULAR | Status: AC
  Filled 2012-08-13: qty 1

## 2012-08-13 MED ORDER — MIDAZOLAM HCL 2 MG/2ML IJ SOLN
1.0000 mg | INTRAMUSCULAR | Status: DC | PRN
Start: 2012-08-13 — End: 2012-08-13
  Administered 2012-08-13: 2 mg via INTRAVENOUS

## 2012-08-13 MED ORDER — KETOROLAC TROMETHAMINE 30 MG/ML IJ SOLN
30.0000 mg | Freq: Once | INTRAMUSCULAR | Status: AC
  Administered 2012-08-13: 30 mg via INTRAVENOUS

## 2012-08-13 MED ORDER — MIDAZOLAM HCL 2 MG/2ML IJ SOLN
INTRAMUSCULAR | Status: AC
  Filled 2012-08-13: qty 2

## 2012-08-13 MED ORDER — MIDAZOLAM HCL 5 MG/5ML IJ SOLN
INTRAMUSCULAR | Status: DC | PRN
  Administered 2012-08-13: 2 mg via INTRAVENOUS

## 2012-08-13 MED ORDER — DEXTROSE 5 % IV SOLN
3.0000 g | INTRAVENOUS | Status: DC | PRN
  Administered 2012-08-13: 3 g via INTRAVENOUS

## 2012-08-13 MED ORDER — CEFAZOLIN SODIUM-DEXTROSE 2-3 GM-% IV SOLR
INTRAVENOUS | Status: AC
  Filled 2012-08-13: qty 50

## 2012-08-13 MED ORDER — CEFAZOLIN SODIUM 1-5 GM-% IV SOLN: 1.0000 g | Freq: Once | INTRAVENOUS | Status: DC

## 2012-08-13 MED ORDER — SUCCINYLCHOLINE CHLORIDE 20 MG/ML IJ SOLN
INTRAMUSCULAR | Status: DC | PRN
  Administered 2012-08-13: 180 mg via INTRAVENOUS

## 2012-08-13 MED ORDER — GLYCOPYRROLATE 0.2 MG/ML IJ SOLN
INTRAMUSCULAR | Status: DC | PRN
  Administered 2012-08-13: 0.4 mg via INTRAVENOUS

## 2012-08-13 MED ORDER — FENTANYL CITRATE 0.05 MG/ML IJ SOLN
INTRAMUSCULAR | Status: DC | PRN
  Administered 2012-08-13 (×2): 50 ug via INTRAVENOUS

## 2012-08-13 MED ORDER — NEOSTIGMINE METHYLSULFATE 1 MG/ML IJ SOLN
INTRAMUSCULAR | Status: DC | PRN
  Administered 2012-08-13: 4 mg via INTRAVENOUS

## 2012-08-13 MED ORDER — LIDOCAINE HCL (PF) 1 % IJ SOLN
INTRAMUSCULAR | Status: AC
  Filled 2012-08-13: qty 5

## 2012-08-13 MED ORDER — HYDROCODONE-ACETAMINOPHEN 5-325 MG PO TABS: 1.0000 | ORAL_TABLET | Freq: Four times a day (QID) | ORAL | Status: DC | PRN

## 2012-08-13 MED ORDER — SODIUM CHLORIDE 0.9 % IR SOLN
Status: DC | PRN
  Administered 2012-08-13: 3000 mL

## 2012-08-13 MED ORDER — CEFAZOLIN SODIUM-DEXTROSE 2-3 GM-% IV SOLR: 2.0000 g | INTRAVENOUS | Status: DC

## 2012-08-13 MED ORDER — ONDANSETRON 4 MG PO TBDP
ORAL_TABLET | ORAL | Status: AC
  Filled 2012-08-13: qty 1

## 2012-08-13 MED ORDER — ONDANSETRON HCL 4 MG/2ML IJ SOLN
INTRAMUSCULAR | Status: AC
  Filled 2012-08-13: qty 2

## 2012-08-13 MED ORDER — FENTANYL CITRATE 0.05 MG/ML IJ SOLN: 25.0000 ug | INTRAMUSCULAR | Status: DC | PRN

## 2012-08-13 MED ORDER — LACTATED RINGERS IV SOLN
INTRAVENOUS | Status: DC
  Administered 2012-08-13: 1000 mL via INTRAVENOUS

## 2012-08-13 MED ORDER — PROPOFOL 10 MG/ML IV BOLUS
INTRAVENOUS | Status: DC | PRN
  Administered 2012-08-13: 150 mg via INTRAVENOUS

## 2012-08-13 SURGICAL SUPPLY — 26 items
BAG HAMPER (MISCELLANEOUS) ×2 IMPLANT
CLOTH BEACON ORANGE TIMEOUT ST (SAFETY) ×2 IMPLANT
COVER LIGHT HANDLE STERIS (MISCELLANEOUS) ×4 IMPLANT
FORMALIN 10 PREFIL 120ML (MISCELLANEOUS) ×2 IMPLANT
GAUZE SPONGE 4X4 16PLY XRAY LF (GAUZE/BANDAGES/DRESSINGS) ×2 IMPLANT
GLOVE BIOGEL PI IND STRL 7.0 (GLOVE) ×1 IMPLANT
GLOVE BIOGEL PI IND STRL 8 (GLOVE) ×1 IMPLANT
GLOVE BIOGEL PI INDICATOR 7.0 (GLOVE) ×1
GLOVE BIOGEL PI INDICATOR 8 (GLOVE) ×1
GLOVE ECLIPSE 6.5 STRL STRAW (GLOVE) ×2 IMPLANT
GLOVE ECLIPSE 8.0 STRL XLNG CF (GLOVE) ×2 IMPLANT
GOWN STRL REIN XL XLG (GOWN DISPOSABLE) ×6 IMPLANT
INST SET HYSTEROSCOPY (KITS) ×2 IMPLANT
IV NS IRRIG 3000ML ARTHROMATIC (IV SOLUTION) ×2 IMPLANT
KIT ROOM TURNOVER APOR (KITS) ×2 IMPLANT
MANIFOLD NEPTUNE II (INSTRUMENTS) ×2 IMPLANT
MANIFOLD NEPTUNE WASTE (CANNULA) ×2 IMPLANT
NS IRRIG 1000ML POUR BTL (IV SOLUTION) ×2 IMPLANT
PACK BASIC III (CUSTOM PROCEDURE TRAY) ×1
PACK SRG BSC III STRL LF ECLPS (CUSTOM PROCEDURE TRAY) ×1 IMPLANT
PAD ARMBOARD 7.5X6 YLW CONV (MISCELLANEOUS) ×2 IMPLANT
PAD TELFA 3X4 1S STER (GAUZE/BANDAGES/DRESSINGS) ×2 IMPLANT
SET IRRIG Y TYPE TUR BLADDER L (SET/KITS/TRAYS/PACK) ×2 IMPLANT
SHEET LAVH (DRAPES) ×2 IMPLANT
TOWEL OR 17X26 4PK STRL BLUE (TOWEL DISPOSABLE) IMPLANT
YANKAUER SUCT BULB TIP 10FT TU (MISCELLANEOUS) ×2 IMPLANT

## 2012-08-13 NOTE — Anesthesia Preprocedure Evaluation (Signed)
Anesthesia Evaluation  Patient identified by MRN, date of birth, ID band Patient awake    Reviewed: Allergy & Precautions, H&P , NPO status , Patient's Chart, lab work & pertinent test results  History of Anesthesia Complications Negative for: history of anesthetic complications  Airway Mallampati: II TM Distance: >3 FB Neck ROM: Full    Dental  (+) Teeth Intact   Pulmonary shortness of breath and with exertion,  breath sounds clear to auscultation        Cardiovascular hypertension, Rhythm:Regular Rate:Normal     Neuro/Psych    GI/Hepatic   Endo/Other  diabetes, Well Controlled, Type 2, Oral Hypoglycemic AgentsMorbid obesity  Renal/GU      Musculoskeletal   Abdominal   Peds  Hematology   Anesthesia Other Findings   Reproductive/Obstetrics                           Anesthesia Physical Anesthesia Plan  ASA: III  Anesthesia Plan: General   Post-op Pain Management:    Induction: Intravenous, Rapid sequence and Cricoid pressure planned  Airway Management Planned: Oral ETT  Additional Equipment:   Intra-op Plan:   Post-operative Plan: Extubation in OR  Informed Consent: I have reviewed the patients History and Physical, chart, labs and discussed the procedure including the risks, benefits and alternatives for the proposed anesthesia with the patient or authorized representative who has indicated his/her understanding and acceptance.     Plan Discussed with:   Anesthesia Plan Comments:         Anesthesia Quick Evaluation

## 2012-08-13 NOTE — Transfer of Care (Signed)
Immediate Anesthesia Transfer of Care Note  Patient: Kaitlyn Hull  Procedure(s) Performed: Procedure(s): DILATATION AND CURETTAGE /HYSTEROSCOPY (N/A)  Patient Location: PACU  Anesthesia Type:General  Level of Consciousness: awake, alert  and patient cooperative  Airway & Oxygen Therapy: Patient Spontanous Breathing and Patient connected to face mask oxygen  Post-op Assessment: Report given to PACU RN, Post -op Vital signs reviewed and stable and Patient moving all extremities  Post vital signs: Reviewed and stable  Complications: No apparent anesthesia complications

## 2012-08-13 NOTE — Anesthesia Procedure Notes (Signed)
Procedure Name: Intubation Date/Time: 08/13/2012 10:15 AM Performed by: Despina Hidden Pre-anesthesia Checklist: Emergency Drugs available, Suction available, Patient being monitored and Patient identified Patient Re-evaluated:Patient Re-evaluated prior to inductionOxygen Delivery Method: Circle system utilized Preoxygenation: Pre-oxygenation with 100% oxygen Intubation Type: IV induction, Cricoid Pressure applied and Rapid sequence Ventilation: Mask ventilation without difficulty and Oral airway inserted - appropriate to patient size Laryngoscope Size: 3 and Mac Grade View: Grade II Tube type: Oral Number of attempts: 1 Airway Equipment and Method: Stylet Placement Confirmation: ETT inserted through vocal cords under direct vision,  positive ETCO2 and breath sounds checked- equal and bilateral Secured at: 22 cm Tube secured with: Tape Dental Injury: Teeth and Oropharynx as per pre-operative assessment

## 2012-08-13 NOTE — Anesthesia Postprocedure Evaluation (Signed)
  Anesthesia Post-op Note  Patient: Kaitlyn Hull  Procedure(s) Performed: Procedure(s): DILATATION AND CURETTAGE /HYSTEROSCOPY (N/A)  Patient Location: PACU  Anesthesia Type:General  Level of Consciousness: awake, alert , oriented and patient cooperative  Airway and Oxygen Therapy: Patient Spontanous Breathing  Post-op Pain: none  Post-op Assessment: Post-op Vital signs reviewed, Patient's Cardiovascular Status Stable, Respiratory Function Stable, Patent Airway, No signs of Nausea or vomiting and Pain level controlled  Post-op Vital Signs: Reviewed and stable  Complications: No apparent anesthesia complications

## 2012-08-13 NOTE — H&P (Signed)
Preoperative History and Physical  Kaitlyn Hull is a 57 y.o. G1P1 with No LMP recorded. Patient is postmenopausal. admitted for a hysteroscopy and uterine curettage for post menopausal bleeding and 16 mm endometrial stripe.  Office biopsy was benign but with such a thickness needs more thorough evaluation.  PMH:    Past Medical History  Diagnosis Date  . Diabetes mellitus     Type II  . Hypertension   . Obesity   . PMB (postmenopausal bleeding) 07/24/2012    Had 16.1 mm endometrium on [place] will get endo biopsy  . Psoriasis   . Breast cancer     left    PSH:     Past Surgical History  Procedure Laterality Date  . Cesarean section  1995  . Breast surgery  02/20/06    left side     POb/GynH:      OB History   Grav Para Term Preterm Abortions TAB SAB Ect Mult Living   1 1              SH:   History  Substance Use Topics  . Smoking status: Former Smoker    Types: Cigarettes  . Smokeless tobacco: Never Used  . Alcohol Use: No    FH:    Family History  Problem Relation Age of Onset  . Heart failure Father   . CAD Father   . Cancer Sister   . Diabetes Sister   . Hypertension Other   . Alzheimer's disease Mother   . Diabetes Sister      Allergies: No Known Allergies  Medications:      Current facility-administered medications:ceFAZolin (ANCEF) IVPB 1 g/50 mL premix, 1 g, Intravenous, Once, Lazaro Arms, MD;  ceFAZolin (ANCEF) IVPB 2 g/50 mL premix, 2 g, Intravenous, Once, Lazaro Arms, MD;  glycopyrrolate (ROBINUL) injection 0.2 mg, 0.2 mg, Intravenous, Once, Laurene Footman, MD;  ketorolac (TORADOL) 30 MG/ML injection 30 mg, 30 mg, Intravenous, Once, Lazaro Arms, MD lactated ringers infusion, , Intravenous, Continuous, Laurene Footman, MD;  midazolam (VERSED) injection 1-2 mg, 1-2 mg, Intravenous, Q5 Min x 3 PRN, Laurene Footman, MD;  ondansetron [facility]) injection 4 mg, 4 mg, Intravenous, Once, Laurene Footman, MD  Review of Systems:   Review of Systems   Constitutional: Negative for fever, chills, weight loss, malaise/fatigue and diaphoresis.  HENT: Negative for hearing loss, ear pain, nosebleeds, congestion, sore throat, neck pain, tinnitus and ear discharge.   Eyes: Negative for blurred vision, double vision, photophobia, pain, discharge and redness.  Respiratory: Negative for cough, hemoptysis, sputum production, shortness of breath, wheezing and stridor.   Cardiovascular: Negative for chest pain, palpitations, orthopnea, claudication, leg swelling and PND.  Gastrointestinal: Positive for abdominal pain. Negative for heartburn, nausea, vomiting, diarrhea, constipation, blood in stool and melena.  Genitourinary: Negative for dysuria, urgency, frequency, hematuria and flank pain.  Musculoskeletal: Negative for myalgias, back pain, joint pain and falls.  Skin: Negative for itching and rash.  Neurological: Negative for dizziness, tingling, tremors, sensory change, speech change, focal weakness, seizures, loss of consciousness, weakness and headaches.  Endo/Heme/Allergies: Negative for environmental allergies and polydipsia. Does not bruise/bleed easily.  Psychiatric/Behavioral: Negative for depression, suicidal ideas, hallucinations, memory loss and substance abuse. The patient is not nervous/anxious and does not have insomnia.      PHYSICAL EXAM:  Blood pressure 99/51, pulse 78, temperature 97.9 F (36.6 C), temperature source Oral, resp. rate 16, weight 323 lb (146.512 kg), SpO2 96.00%.    Vitals  reviewed. Constitutional: She is oriented to person, place, and time. She appears well-developed and well-nourished.  HENT:  Head: Normocephalic and atraumatic.  Right Ear: External ear normal.  Left Ear: External ear normal.  Nose: Nose normal.  Mouth/Throat: Oropharynx is clear and moist.  Eyes: Conjunctivae and EOM are normal. Pupils are equal, round, and reactive to light. Right eye exhibits no discharge. Left eye exhibits no discharge.  No scleral icterus.  Neck: Normal range of motion. Neck supple. No tracheal deviation present. No thyromegaly present.  Cardiovascular: Normal rate, regular rhythm, normal heart sounds and intact distal pulses.  Exam reveals no gallop and no friction rub.   No murmur heard. Respiratory: Effort normal and breath sounds normal. No respiratory distress. She has no wheezes. She has no rales. She exhibits no tenderness.  GI: Soft. Bowel sounds are normal. She exhibits no distension and no mass. There is tenderness. There is no rebound and no guarding.  Genitourinary:       Vulva is normal without lesions Vagina is pink moist without discharge Cervix normal in appearance and pap is normal Uterus is normal size with 16 mm stripe width Adnexa is negative with normal sized ovaries by sonogram  Musculoskeletal: Normal range of motion. She exhibits no edema and no tenderness.  Neurological: She is alert and oriented to person, place, and time. She has normal reflexes. She displays normal reflexes. No cranial nerve deficit. She exhibits normal muscle tone. Coordination normal.  Skin: Skin is warm and dry. No rash noted. No erythema. No pallor.  Psychiatric: She has a normal mood and affect. Her behavior is normal. Judgment and thought content normal.    Labs: Results for orders placed during the hospital encounter of 08/13/12 (from the past 336 hour(s))  GLUCOSE, CAPILLARY   Collection Time    08/13/12  8:56 AM      Result Value Range   Glucose-Capillary 142 (*) 70 - 99 mg/dL  Results for orders placed during the hospital encounter of 08/08/12 (from the past 336 hour(s))  SURGICAL PCR SCREEN   Collection Time    08/08/12  2:17 PM      Result Value Range   MRSA, PCR NEGATIVE  NEGATIVE   Staphylococcus aureus NEGATIVE  NEGATIVE  CBC   Collection Time    08/08/12  3:00 PM      Result Value Range   WBC 6.8  4.0 - 10.5 K/uL   RBC 4.28  3.87 - 5.11 MIL/uL   Hemoglobin 12.8  12.0 - 15.0 g/dL    HCT 16.1  09.6 - 04.5 %   MCV 91.4  78.0 - 100.0 fL   MCH 29.9  26.0 - 34.0 pg   MCHC 32.7  30.0 - 36.0 g/dL   RDW 40.9  81.1 - 91.4 %   Platelets 151  150 - 400 K/uL  COMPREHENSIVE METABOLIC PANEL   Collection Time    08/08/12  3:00 PM      Result Value Range   Sodium 140  135 - 145 mEq/L   Potassium 3.8  3.5 - 5.1 mEq/L   Chloride 102  96 - 112 mEq/L   CO2 26  19 - 32 mEq/L   Glucose, Bld 126 (*) 70 - 99 mg/dL   BUN 12  6 - 23 mg/dL   Creatinine, Ser 7.82  0.50 - 1.10 mg/dL   Calcium 9.6  8.4 - 95.6 mg/dL   Total Protein 7.4  6.0 - 8.3 g/dL   Albumin 3.4 (*)  3.5 - 5.2 g/dL   AST 40 (*) 0 - 37 U/L   ALT 25  0 - 35 U/L   Alkaline Phosphatase 121 (*) 39 - 117 U/L   Total Bilirubin 0.5  0.3 - 1.2 mg/dL   GFR calc non Af Amer 69 (*) >90 mL/min   GFR calc Af Amer 80 (*) >90 mL/min    EKG: Orders placed during the hospital encounter of 10/02/11  . ED EKG  . ED EKG  . EKG 12-LEAD  . EKG 12-LEAD  . EKG    Imaging Studies: US Transvaginal Non-ob  08/11/2012   Kaitlyn Hull is a 57 y.o. G1P1 No LMP recorded. Patient is postmenopausal. for a pelvic sonogram for post menopausal bleeding.  Uterus                      10.4 x 7.5 x 6.4 cm,   Endometrium          16.1 mm, symmetrical, homogenous  Right ovary             2,1 x 2.0 x 1.5 cm,   Left ovary                2.4 x 1.5 x 1.4 cm,   No free fluid or adnexal masses  Impression: Thickened endometrium in a post menopausal bleeding patient    EURE,LUTHER H 5.22.2014         Assessment: Post menopausal bleeding with pathologically thickened endometrial stripe Office biopsy benign Patient Active Problem List   Diagnosis Date Noted  . History of breast cancer 07/24/2012  . Diabetes 07/24/2012  . Hypertension 07/24/2012  . Obesity 07/24/2012  . PMB (postmenopausal bleeding) 07/24/2012    Plan: Hysteroscopy with uterine curettage for full endometrial evaluation   EURE,LUTHER H 08/13/2012 9:29 AM

## 2012-08-13 NOTE — Op Note (Signed)
Preoperative diagnosis: Post menopausal bleeding                                         Thickened endometrial stripe                                        Morbid obesity  Postoperative diagnoses: Same as above  Procedure: Hysteroscopy, uterine curettage   Surgeon: Despina Hidden MD   Anesthesia: general endotrachael   Findings: The endometrium was normal.   Description of operation: The patient was taken to the operating room and placed in the supine position. She underwent general anesthesia using the laryngeal mask airway. She was placed in the dorsal lithotomy position and prepped and draped in the usual sterile fashion. A Graves speculum was placed and the anterior cervical lip was grasped with a single-tooth tenaculum. The cervix was dilated serially to allow passage of the hysteroscope.  Diagnostic hysteroscopy was performed and was found to be normal. A vigorous uterine curettage was then performed and all tissue sent to pathology for evaluation.  The patient was awakened from anesthesia and taken to the recovery room in good stable condition all counts were correct. She received 3 g of Ancef and 30 mg of Toradol preoperatively. She will be discharged from the recovery room and followed up in the office in 1- 2 weeks.  Kaitlyn Hull H 08/13/2012 10:43 AM

## 2012-08-14 ENCOUNTER — Encounter (HOSPITAL_COMMUNITY): Payer: Self-pay | Admitting: Obstetrics & Gynecology

## 2012-08-20 ENCOUNTER — Ambulatory Visit (INDEPENDENT_AMBULATORY_CARE_PROVIDER_SITE_OTHER): Payer: 59 | Admitting: Obstetrics & Gynecology

## 2012-08-20 ENCOUNTER — Encounter: Payer: Self-pay | Admitting: Obstetrics & Gynecology

## 2012-08-20 VITALS — BP 110/80 | Wt 323.0 lb

## 2012-08-20 DIAGNOSIS — N950 Postmenopausal bleeding: Secondary | ICD-10-CM

## 2012-08-20 MED ORDER — PROGESTERONE MICRONIZED 200 MG PO CAPS: ORAL_CAPSULE | ORAL | Status: DC

## 2012-08-20 NOTE — Progress Notes (Signed)
Patient ID: Kaitlyn Hull, female   DOB: 1955-03-22, 57 y.o.   MRN: [id number] Kaitlyn Hull is an for a postoperative visit. She presented with postmenopausal bleeding, a history of breast cancer and a history of tamoxifen therapy although she No longer takes it, I performed a diagnostic hysteroscopy and uterine curettage and 08/13/2012. The surgery was uncomplicated and without problems Postoperatively she is done well without complaint she continues to have some bleeding  The pathology has returned as benign, proliferative and secretory changes consistent with ongoing exogenous estrogenic stimulation.  As a result I will place her room Prometrium 200 mg at night and followup ultrasound in 3 months and it that's normal due on a yearly basis

## 2012-09-26 ENCOUNTER — Other Ambulatory Visit: Payer: Self-pay | Admitting: Adult Health

## 2012-10-06 ENCOUNTER — Telehealth: Payer: Self-pay | Admitting: Obstetrics & Gynecology

## 2012-10-06 ENCOUNTER — Encounter: Payer: Self-pay | Admitting: Obstetrics & Gynecology

## 2012-10-06 ENCOUNTER — Ambulatory Visit (INDEPENDENT_AMBULATORY_CARE_PROVIDER_SITE_OTHER): Payer: 59 | Admitting: Obstetrics & Gynecology

## 2012-10-06 VITALS — BP 100/60 | Wt 321.0 lb

## 2012-10-06 DIAGNOSIS — N950 Postmenopausal bleeding: Secondary | ICD-10-CM

## 2012-10-06 DIAGNOSIS — N898 Other specified noninflammatory disorders of vagina: Secondary | ICD-10-CM

## 2012-10-06 DIAGNOSIS — N939 Abnormal uterine and vaginal bleeding, unspecified: Secondary | ICD-10-CM

## 2012-10-06 LAB — POCT HEMOGLOBIN: Hemoglobin: 12.2 g/dL (ref 12.2–16.2)

## 2012-10-06 MED ORDER — MEGESTROL ACETATE 40 MG PO TABS: ORAL_TABLET | ORAL | Status: DC

## 2012-10-06 NOTE — Patient Instructions (Signed)

## 2012-10-06 NOTE — Telephone Encounter (Signed)
Pt states had surgery D&C/hysteroscopy on 08/13/2012, now having bleeding with clots. Pt informed to be here today at 3:30 pm to see Dr. Despina Hidden.

## 2012-10-06 NOTE — Progress Notes (Signed)
Patient ID: Kaitlyn Hull, female   DOB: 08-14-1955, 57 y.o.   MRN: [id number] Unfortunately Loreda is not behaving Really her endometrium is not behaving Ooze and bleeding since July 11 taking the Prometrium We know from hysteroscopy and uterine curettage her endometrial pathology is negative, but does indeed show evidence of proliferative changes from exogenous estrogen production  As a result she needs endometrial suppression  Some switch her to Megace algorithm to try to suppress her bleeding Her hemoglobin today is 12.2 No pain  All see her back as scheduled in September

## 2012-11-20 ENCOUNTER — Ambulatory Visit (INDEPENDENT_AMBULATORY_CARE_PROVIDER_SITE_OTHER): Payer: 59

## 2012-11-20 ENCOUNTER — Encounter: Payer: Self-pay | Admitting: Obstetrics & Gynecology

## 2012-11-20 ENCOUNTER — Ambulatory Visit (INDEPENDENT_AMBULATORY_CARE_PROVIDER_SITE_OTHER): Payer: 59 | Admitting: Obstetrics & Gynecology

## 2012-11-20 VITALS — BP 114/80 | Ht 62.0 in | Wt 317.5 lb

## 2012-11-20 DIAGNOSIS — N950 Postmenopausal bleeding: Secondary | ICD-10-CM

## 2012-11-20 NOTE — Progress Notes (Signed)
Patient ID: Kaitlyn Hull, female   DOB: 1955-08-16, 57 y.o.   MRN: [id number] Kaitlyn Hull is in today after her sonogram  To recap, [name] presented the office in May complaining of vaginal bleeding She had a sonogram which showed a thickened endometrium of 16 mm Endometrial biopsy was benign so I proceeded with a hysteroscopy D&C Endometrial curettage was also benign with proliferative disorder endometrium  As a result conclusion issues making fashion from her peripheral fat which is causing endometrial stimulation Of note she has a history of breast cancer and is not on tamoxifen, she did take it for 3 years but stopped it approximately 2 years ago So there was not the problem that we sometimes it with tamoxifen  I placed her on Prometrium to provide endometrial suppression but she continued to bleed I then placed around megestrol which has stopped her bleeding completely I repeated a sonogram to just see what the reaction was to the Megace and indeed she has what appears to be decidual changes which is expected physiologic  As a result I would continue her on Megace but have her take it days 1 through 25 which should produce predictable bleeding and her off days She is instructed to expect this Approximately 6 months she now do another ultrasound after she has bled in order to evaluate her endometrial lining at that point  If she has any problems in the meantime she'll let in the

## 2013-01-14 ENCOUNTER — Other Ambulatory Visit: Payer: Self-pay | Admitting: Family Medicine

## 2013-01-14 DIAGNOSIS — Z9889 Other specified postprocedural states: Secondary | ICD-10-CM

## 2013-01-14 DIAGNOSIS — Z853 Personal history of malignant neoplasm of breast: Secondary | ICD-10-CM

## 2013-02-20 ENCOUNTER — Ambulatory Visit
Admission: RE | Admit: 2013-02-20 | Discharge: 2013-02-20 | Disposition: A | Discharge status: Home / Self Care | Payer: 59 | Source: Ambulatory Visit | Attending: Family Medicine | Admitting: Family Medicine

## 2013-02-20 DIAGNOSIS — Z853 Personal history of malignant neoplasm of breast: Secondary | ICD-10-CM

## 2013-02-20 DIAGNOSIS — Z9889 Other specified postprocedural states: Secondary | ICD-10-CM

## 2013-05-04 ENCOUNTER — Ambulatory Visit (INDEPENDENT_AMBULATORY_CARE_PROVIDER_SITE_OTHER): Payer: 59 | Admitting: Obstetrics & Gynecology

## 2013-05-04 ENCOUNTER — Encounter: Payer: Self-pay | Admitting: Obstetrics & Gynecology

## 2013-05-04 ENCOUNTER — Encounter (INDEPENDENT_AMBULATORY_CARE_PROVIDER_SITE_OTHER): Payer: Self-pay

## 2013-05-04 VITALS — BP 100/80 | Wt 302.0 lb

## 2013-05-04 DIAGNOSIS — N950 Postmenopausal bleeding: Secondary | ICD-10-CM

## 2013-05-10 NOTE — Progress Notes (Signed)
Patient ID: Kaitlyn Hull, female   DOB: Feb 04, 1956, 58 y.o.   MRN: [id number] Pt on chronic megestro therapy for endometrial suppression  Pathology benign but continued to bleed unless on suppression  As a result on chronic therapy  She is going to continue on megestrol but tak it cyclically and see if there is bleeding She will call if there is any problem with cyclical therapy  Past Medical History  Diagnosis Date  . Diabetes mellitus     Type II  . Hypertension   . Obesity   . PMB (postmenopausal bleeding) 07/24/2012    Had 16.1 mm endometrium on [place] will get endo biopsy  . Psoriasis   . Breast cancer     left    Past Surgical History  Procedure Laterality Date  . Cesarean section  1995  . Breast surgery  02/20/06    left side   . Hysteroscopy w/d&c N/A 08/13/2012    Procedure: DILATATION AND CURETTAGE /HYSTEROSCOPY;  Surgeon: Florian Buff, MD;  Location: [facility];  Service: Gynecology;  Laterality: N/A;    OB History   Grav Para Term Preterm Abortions TAB SAB Ect Mult Living   1 1              No Known Allergies  History   Social History  . Marital Status: Married    Spouse Name: N/A    Number of Children: N/A  . Years of Education: N/A   Social History Main Topics  . Smoking status: Former Smoker    Types: Cigarettes  . Smokeless tobacco: Never Used  . Alcohol Use: No  . Drug Use: No  . Sexual Activity: No   Other Topics Concern  . None   Social History Narrative  . None    Family History  Problem Relation Age of Onset  . Heart failure Father   . CAD Father   . Cancer Sister   . Diabetes Sister   . Hypertension Other   . Alzheimer's disease Mother   . Diabetes Sister

## 2013-05-28 ENCOUNTER — Telehealth: Payer: Self-pay | Admitting: Obstetrics & Gynecology

## 2013-05-28 NOTE — Telephone Encounter (Signed)
Pt here earlier in the month and was given a hormone pill to take a certain time in the month. Pt states that she has had bleeding for the 3-4 days and having cramping and clotting, today has been heavy and painful. Does she need to start her medication again?

## 2013-05-29 ENCOUNTER — Other Ambulatory Visit: Payer: Self-pay | Admitting: Obstetrics & Gynecology

## 2013-05-29 MED ORDER — MEGESTROL ACETATE 40 MG PO TABS: ORAL_TABLET | ORAL | Status: DC

## 2013-05-29 NOTE — Telephone Encounter (Signed)
Pt needs to go back unfortunately on chronic daily megace, cyclical is not seeming to work  I have e prescribed in her new megestrol regimen to follow

## 2013-06-01 NOTE — Telephone Encounter (Signed)
Pt aware that megace called into the pharmacy and should be ready for her to pick up.

## 2013-06-03 DIAGNOSIS — I4891 Unspecified atrial fibrillation: Secondary | ICD-10-CM

## 2013-06-03 DIAGNOSIS — E0590 Thyrotoxicosis, unspecified without thyrotoxic crisis or storm: Secondary | ICD-10-CM

## 2013-06-03 HISTORY — DX: Thyrotoxicosis, unspecified without thyrotoxic crisis or storm: E05.90

## 2013-06-03 HISTORY — DX: Unspecified atrial fibrillation: I48.91

## 2013-06-26 ENCOUNTER — Encounter (HOSPITAL_COMMUNITY): Payer: Self-pay | Admitting: Emergency Medicine

## 2013-06-26 ENCOUNTER — Inpatient Hospital Stay (HOSPITAL_COMMUNITY)
Admission: EM | Admit: 2013-06-26 | Discharge: 2013-07-02 | DRG: 309 | Disposition: A | Discharge status: Home / Self Care | Payer: 59 | Attending: Family Medicine | Admitting: Family Medicine

## 2013-06-26 ENCOUNTER — Emergency Department (HOSPITAL_COMMUNITY): Payer: 59

## 2013-06-26 DIAGNOSIS — Z23 Encounter for immunization: Secondary | ICD-10-CM

## 2013-06-26 DIAGNOSIS — I482 Chronic atrial fibrillation, unspecified: Secondary | ICD-10-CM | POA: Diagnosis present

## 2013-06-26 DIAGNOSIS — Z820 Family history of epilepsy and other diseases of the nervous system: Secondary | ICD-10-CM

## 2013-06-26 DIAGNOSIS — D649 Anemia, unspecified: Secondary | ICD-10-CM | POA: Diagnosis present

## 2013-06-26 DIAGNOSIS — Z6841 Body Mass Index (BMI) 40.0 and over, adult: Secondary | ICD-10-CM

## 2013-06-26 DIAGNOSIS — I959 Hypotension, unspecified: Secondary | ICD-10-CM

## 2013-06-26 DIAGNOSIS — I4891 Unspecified atrial fibrillation: Principal | ICD-10-CM

## 2013-06-26 DIAGNOSIS — E872 Acidosis, unspecified: Secondary | ICD-10-CM | POA: Diagnosis present

## 2013-06-26 DIAGNOSIS — Z853 Personal history of malignant neoplasm of breast: Secondary | ICD-10-CM

## 2013-06-26 DIAGNOSIS — E669 Obesity, unspecified: Secondary | ICD-10-CM

## 2013-06-26 DIAGNOSIS — E119 Type 2 diabetes mellitus without complications: Secondary | ICD-10-CM

## 2013-06-26 DIAGNOSIS — I10 Essential (primary) hypertension: Secondary | ICD-10-CM

## 2013-06-26 DIAGNOSIS — I498 Other specified cardiac arrhythmias: Secondary | ICD-10-CM | POA: Diagnosis present

## 2013-06-26 DIAGNOSIS — Z87891 Personal history of nicotine dependence: Secondary | ICD-10-CM

## 2013-06-26 DIAGNOSIS — Z833 Family history of diabetes mellitus: Secondary | ICD-10-CM

## 2013-06-26 DIAGNOSIS — Z8249 Family history of ischemic heart disease and other diseases of the circulatory system: Secondary | ICD-10-CM

## 2013-06-26 DIAGNOSIS — E0590 Thyrotoxicosis, unspecified without thyrotoxic crisis or storm: Secondary | ICD-10-CM

## 2013-06-26 DIAGNOSIS — E7800 Pure hypercholesterolemia, unspecified: Secondary | ICD-10-CM | POA: Diagnosis present

## 2013-06-26 HISTORY — DX: Thyrotoxicosis, unspecified without thyrotoxic crisis or storm: E05.90

## 2013-06-26 HISTORY — DX: Unspecified atrial fibrillation: I48.91

## 2013-06-26 LAB — CBC WITH DIFFERENTIAL/PLATELET
Basophils Absolute: 0 10*3/uL (ref 0.0–0.1)
Basophils Relative: 0 % (ref 0–1)
EOS PCT: 1 % (ref 0–5)
Eosinophils Absolute: 0.1 10*3/uL (ref 0.0–0.7)
HCT: 32.7 % — ABNORMAL LOW (ref 36.0–46.0)
HEMOGLOBIN: 11 g/dL — AB (ref 12.0–15.0)
LYMPHS ABS: 1.8 10*3/uL (ref 0.7–4.0)
LYMPHS PCT: 21 % (ref 12–46)
MCH: 30 pg (ref 26.0–34.0)
MCHC: 33.6 g/dL (ref 30.0–36.0)
MCV: 89.1 fL (ref 78.0–100.0)
MONOS PCT: 11 % (ref 3–12)
Monocytes Absolute: 1 10*3/uL (ref 0.1–1.0)
Neutro Abs: 6 10*3/uL (ref 1.7–7.7)
Neutrophils Relative %: 67 % (ref 43–77)
Platelets: 196 10*3/uL (ref 150–400)
RBC: 3.67 MIL/uL — AB (ref 3.87–5.11)
RDW: 12.9 % (ref 11.5–15.5)
WBC: 8.9 10*3/uL (ref 4.0–10.5)

## 2013-06-26 LAB — BASIC METABOLIC PANEL
BUN: 45 mg/dL — ABNORMAL HIGH (ref 6–23)
CALCIUM: 10.9 mg/dL — AB (ref 8.4–10.5)
CO2: 17 meq/L — AB (ref 19–32)
Chloride: 106 mEq/L (ref 96–112)
Creatinine, Ser: 1.06 mg/dL (ref 0.50–1.10)
GFR calc Af Amer: 67 mL/min — ABNORMAL LOW (ref >90)
GFR calc non Af Amer: 58 mL/min — ABNORMAL LOW (ref >90)
GLUCOSE: 153 mg/dL — AB (ref 70–99)
Potassium: 4.5 mEq/L (ref 3.7–5.3)
Sodium: 141 mEq/L (ref 137–147)

## 2013-06-26 LAB — GLUCOSE, CAPILLARY
GLUCOSE-CAPILLARY: 128 mg/dL — AB (ref 70–99)
Glucose-Capillary: 149 mg/dL — ABNORMAL HIGH (ref 70–99)

## 2013-06-26 LAB — TROPONIN I
Troponin I: <0.3 ng/mL (ref <0.30)
Troponin I: <0.3 ng/mL (ref <0.30)

## 2013-06-26 LAB — MAGNESIUM: Magnesium: 1.6 mg/dL (ref 1.5–2.5)

## 2013-06-26 LAB — MRSA PCR SCREENING: MRSA by PCR: NEGATIVE

## 2013-06-26 MED ORDER — DIGOXIN 125 MCG PO TABS
0.1250 mg | ORAL_TABLET | Freq: Every day | ORAL | Status: DC
  Administered 2013-06-27 – 2013-06-29 (×3): 0.125 mg via ORAL
  Filled 2013-06-26 (×3): qty 1

## 2013-06-26 MED ORDER — MAGNESIUM SULFATE 40 MG/ML IJ SOLN
2.0000 g | Freq: Once | INTRAMUSCULAR | Status: AC
  Administered 2013-06-26: 2 g via INTRAVENOUS
  Filled 2013-06-26: qty 50

## 2013-06-26 MED ORDER — MORPHINE SULFATE 2 MG/ML IJ SOLN: 1.0000 mg | INTRAMUSCULAR | Status: DC | PRN

## 2013-06-26 MED ORDER — SODIUM CHLORIDE 0.9 % IV SOLN
INTRAVENOUS | Status: DC
  Administered 2013-06-26: 12:00:00 via INTRAVENOUS

## 2013-06-26 MED ORDER — BISACODYL 10 MG RE SUPP: 10.0000 mg | Freq: Every day | RECTAL | Status: DC | PRN

## 2013-06-26 MED ORDER — ACETAMINOPHEN 325 MG PO TABS
650.0000 mg | ORAL_TABLET | Freq: Four times a day (QID) | ORAL | Status: DC | PRN
  Administered 2013-06-26 – 2013-06-28 (×2): 650 mg via ORAL
  Filled 2013-06-26 (×2): qty 2

## 2013-06-26 MED ORDER — HYDROCODONE-ACETAMINOPHEN 5-325 MG PO TABS: 1.0000 | ORAL_TABLET | ORAL | Status: DC | PRN

## 2013-06-26 MED ORDER — DIGOXIN 0.25 MG/ML IJ SOLN
0.5000 mg | Freq: Once | INTRAMUSCULAR | Status: AC
  Administered 2013-06-26: 0.5 mg via INTRAVENOUS
  Filled 2013-06-26: qty 2

## 2013-06-26 MED ORDER — SODIUM CHLORIDE 0.9 % IV BOLUS (SEPSIS)
1000.0000 mL | Freq: Once | INTRAVENOUS | Status: AC
  Administered 2013-06-26: 1000 mL via INTRAVENOUS

## 2013-06-26 MED ORDER — SODIUM CHLORIDE 0.9 % IV BOLUS (SEPSIS)
250.0000 mL | Freq: Once | INTRAVENOUS | Status: AC
  Administered 2013-06-26: 250 mL via INTRAVENOUS

## 2013-06-26 MED ORDER — DILTIAZEM HCL 100 MG IV SOLR
5.0000 mg/h | INTRAVENOUS | Status: DC
  Administered 2013-06-26: 5 mg/h via INTRAVENOUS
  Filled 2013-06-26: qty 100

## 2013-06-26 MED ORDER — DILTIAZEM LOAD VIA INFUSION
15.0000 mg | Freq: Once | INTRAVENOUS | Status: AC
  Administered 2013-06-26: 15 mg via INTRAVENOUS
  Filled 2013-06-26: qty 15

## 2013-06-26 MED ORDER — DIGOXIN 0.25 MG/ML IJ SOLN: 0.2500 mg | Freq: Four times a day (QID) | INTRAMUSCULAR | Status: DC

## 2013-06-26 MED ORDER — SODIUM CHLORIDE 0.9 % IV SOLN
INTRAVENOUS | Status: DC
Start: 2013-06-26 — End: 2013-07-01
  Administered 2013-06-29 (×2): via INTRAVENOUS

## 2013-06-26 MED ORDER — ONDANSETRON HCL 4 MG PO TABS: 4.0000 mg | ORAL_TABLET | Freq: Four times a day (QID) | ORAL | Status: DC | PRN

## 2013-06-26 MED ORDER — ONDANSETRON HCL 4 MG/2ML IJ SOLN: 4.0000 mg | Freq: Four times a day (QID) | INTRAMUSCULAR | Status: DC | PRN

## 2013-06-26 MED ORDER — DILTIAZEM HCL 25 MG/5ML IV SOLN
20.0000 mg | Freq: Once | INTRAVENOUS | Status: AC
  Administered 2013-06-26: 20 mg via INTRAVENOUS

## 2013-06-26 MED ORDER — DILTIAZEM HCL 100 MG IV SOLR
5.0000 mg/h | INTRAVENOUS | Status: DC
  Filled 2013-06-26: qty 100

## 2013-06-26 MED ORDER — SIMVASTATIN 10 MG PO TABS
5.0000 mg | ORAL_TABLET | Freq: Every day | ORAL | Status: DC
  Administered 2013-06-26 – 2013-07-01 (×6): 5 mg via ORAL
  Filled 2013-06-26: qty 1
  Filled 2013-06-26: qty 0.5
  Filled 2013-06-26: qty 1
  Filled 2013-06-26 (×7): qty 0.5
  Filled 2013-06-26: qty 1

## 2013-06-26 MED ORDER — APIXABAN 5 MG PO TABS
5.0000 mg | ORAL_TABLET | Freq: Two times a day (BID) | ORAL | Status: DC
Start: 2013-06-26 — End: 2013-07-02
  Administered 2013-06-26 – 2013-07-02 (×13): 5 mg via ORAL
  Filled 2013-06-26 (×17): qty 1

## 2013-06-26 MED ORDER — DIGOXIN 0.25 MG/ML IJ SOLN
0.2500 mg | INTRAMUSCULAR | Status: AC
  Administered 2013-06-26 (×2): 0.25 mg via INTRAVENOUS
  Filled 2013-06-26 (×2): qty 2

## 2013-06-26 MED ORDER — INSULIN ASPART 100 UNIT/ML ~~LOC~~ SOLN
0.0000 [IU] | Freq: Three times a day (TID) | SUBCUTANEOUS | Status: DC
  Administered 2013-06-26 – 2013-06-27 (×3): 2 [IU] via SUBCUTANEOUS
  Administered 2013-06-27: 3 [IU] via SUBCUTANEOUS
  Administered 2013-06-28 – 2013-06-29 (×2): 2 [IU] via SUBCUTANEOUS
  Administered 2013-06-29: 3 [IU] via SUBCUTANEOUS
  Administered 2013-06-30 – 2013-07-02 (×4): 2 [IU] via SUBCUTANEOUS

## 2013-06-26 MED ORDER — ALUM & MAG HYDROXIDE-SIMETH 200-200-20 MG/5ML PO SUSP
30.0000 mL | Freq: Four times a day (QID) | ORAL | Status: DC | PRN
Start: 2013-06-26 — End: 2013-07-02

## 2013-06-26 MED ORDER — DILTIAZEM HCL 25 MG/5ML IV SOLN
10.0000 mg | Freq: Once | INTRAVENOUS | Status: AC
  Administered 2013-06-26: 10 mg via INTRAVENOUS

## 2013-06-26 MED ORDER — AMIODARONE HCL IN DEXTROSE 360-4.14 MG/200ML-% IV SOLN
30.0000 mg/h | INTRAVENOUS | Status: DC
  Administered 2013-06-27 – 2013-06-29 (×6): 30 mg/h via INTRAVENOUS
  Filled 2013-06-26 (×6): qty 200

## 2013-06-26 MED ORDER — INSULIN ASPART 100 UNIT/ML ~~LOC~~ SOLN: 0.0000 [IU] | Freq: Every day | SUBCUTANEOUS | Status: DC

## 2013-06-26 MED ORDER — SENNA 8.6 MG PO TABS
1.0000 | ORAL_TABLET | Freq: Two times a day (BID) | ORAL | Status: DC
  Administered 2013-06-26 – 2013-07-02 (×12): 8.6 mg via ORAL
  Filled 2013-06-26 (×12): qty 1

## 2013-06-26 MED ORDER — PNEUMOCOCCAL VAC POLYVALENT 25 MCG/0.5ML IJ INJ
0.5000 mL | INJECTION | INTRAMUSCULAR | Status: AC
  Administered 2013-06-27: 0.5 mL via INTRAMUSCULAR
  Filled 2013-06-26: qty 0.5

## 2013-06-26 MED ORDER — DILTIAZEM HCL 100 MG IV SOLR
10.0000 mg/h | Freq: Once | INTRAVENOUS | Status: AC
  Filled 2013-06-26: qty 100

## 2013-06-26 MED ORDER — ACETAMINOPHEN 650 MG RE SUPP: 650.0000 mg | Freq: Four times a day (QID) | RECTAL | Status: DC | PRN

## 2013-06-26 MED ORDER — AMIODARONE LOAD VIA INFUSION
150.0000 mg | Freq: Once | INTRAVENOUS | Status: AC
  Administered 2013-06-26: 150 mg via INTRAVENOUS
  Filled 2013-06-26: qty 83.34

## 2013-06-26 MED ORDER — AMIODARONE HCL IN DEXTROSE 360-4.14 MG/200ML-% IV SOLN
60.0000 mg/h | INTRAVENOUS | Status: AC
  Administered 2013-06-26 (×2): 60 mg/h via INTRAVENOUS
  Filled 2013-06-26 (×2): qty 200

## 2013-06-26 MED ORDER — ENOXAPARIN SODIUM 40 MG/0.4ML ~~LOC~~ SOLN
40.0000 mg | SUBCUTANEOUS | Status: DC
  Administered 2013-06-26: 40 mg via SUBCUTANEOUS
  Filled 2013-06-26: qty 0.4

## 2013-06-26 NOTE — Consult Note (Signed)
Consulting cardiologist: Dr. Carlyle Dolly MD  Clinical Summary Ms. Biddy is a 58 y.o.female hx of DM, HTN, obesity admitted with chest pain. Describes a sharp pleuritic chest pain in mid chest that comes and goes since Wednesday, worst with deep breathing and position. No other associated symptoms. Denies any palpitations, no lightheadedness or dizziness.      In ER p 176 bp 120/69, found to be in afib. She has been bolused diltiazem and started on dilt gtt, heart rates remain elevated in the 150s.  Mg 1.6, K 4.5, trop neg. CXR no acute process.        No Known Allergies  Medications Scheduled Medications:     Infusions: . sodium chloride 75 mL/hr at 06/26/13 1151  . diltiazem (CARDIZEM) infusion 15 mg/hr (06/26/13 1254)  . diltiazem    . sodium chloride       PRN Medications:     Past Medical History  Diagnosis Date  . Diabetes mellitus     Type II  . Hypertension   . Obesity   . PMB (postmenopausal bleeding) 07/24/2012    Had 16.1 mm endometrium on [place] will get endo biopsy  . Psoriasis   . Breast cancer 2007    left    Past Surgical History  Procedure Laterality Date  . Cesarean section  1995  . Breast surgery  02/20/06    left side   . Hysteroscopy w/d&c N/A 08/13/2012    Procedure: DILATATION AND CURETTAGE /HYSTEROSCOPY;  Surgeon: Florian Buff, MD;  Location: [facility];  Service: Gynecology;  Laterality: N/A;    Family History  Problem Relation Age of Onset  . Heart failure Father   . CAD Father   . Cancer Sister   . Diabetes Sister   . Hypertension Other   . Alzheimer's disease Mother   . Diabetes Sister     Social History Ms. Rooks reports that she quit smoking about 10 years ago. Her smoking use included Cigarettes. She has a .01 pack-year smoking history. She has never used smokeless tobacco. Ms. Rief reports that she does not drink alcohol.  Review of Systems CONSTITUTIONAL: No weight loss, fever, chills, weakness or fatigue.    HEENT: Eyes: No visual loss, blurred vision, double vision or yellow sclerae. No hearing loss, sneezing, congestion, runny nose or sore throat.  SKIN: No rash or itching.  CARDIOVASCULAR: per HPI RESPIRATORY: No shortness of breath, cough or sputum.  GASTROINTESTINAL: No anorexia, nausea, vomiting or diarrhea. No abdominal pain or blood.  GENITOURINARY: no polyuria, no dysuria NEUROLOGICAL: No headache, dizziness, syncope, paralysis, ataxia, numbness or tingling in the extremities. No change in bowel or bladder control.  MUSCULOSKELETAL: No muscle, back pain, joint pain or stiffness.  HEMATOLOGIC: No anemia, bleeding or bruising.  LYMPHATICS: No enlarged nodes. No history of splenectomy.  PSYCHIATRIC: No history of depression or anxiety.      Physical Examination Blood pressure 97/56, pulse 161, temperature 97.8 F (36.6 C), temperature source Oral, resp. rate 31, height 5\' 2"  (1.575 m), weight 280 lb (127.007 kg), last menstrual period 09/02/2012, SpO2 98.00%. No intake or output data in the 24 hours ending 06/26/13 1318  HEENT: sclera clear, throat clear, no palpable adenoapthy  Cardiovascular: irreg, tachy 150, no m/r/g, no JVD  Respiratory: CTAB  GI: abdomen soft, NT, ND  MSK: trace bilateral edema  Neuro: no focal deficits  Psych: appropriate affect   Lab Results  Basic Metabolic Panel:  Recent Labs Lab 06/26/13 1144  NA 141  K 4.5  CL 106  CO2 17*  GLUCOSE 153*  BUN 45*  CREATININE 1.06  CALCIUM 10.9*  MG 1.6    Liver Function Tests: No results found for this basename: AST, ALT, ALKPHOS, BILITOT, PROT, ALBUMIN,  in the last 168 hours  CBC:  Recent Labs Lab 06/26/13 1144  WBC 8.9  NEUTROABS 6.0  HGB 11.0*  HCT 32.7*  MCV 89.1  PLT 196    Cardiac Enzymes:  Recent Labs Lab 06/26/13 1144  TROPONINI <0.30    BNP: No components found with this basename: POCBNP,    ECG Afib rate 180  Impression/Recommendations 1. New onset afib -  inadequate rate control despite dilt boluses and drip at 15, blood pressure remains stable - will load with IV digoxin, 0.5mg  IV x 1 now then 0.25mg  IV q 6 hrs x 2 more doses, then start oral maintenance 0.125 daily - if digoxin load does not work next step would be amiodarone IV load - her CHADS2Vasc score is 3 (HTN, DM, female) putting her at higher risk of stroke, discussed the risks and benefits of anticoag. Will start eliquis 5mg  bid.  - Mg is 1.6, will give 2g magnesium sulfate IV - added TSH to AM labs. Check echo tomorrow once rates better controlled.     Carlyle Dolly, M.D., F.A.C.C.

## 2013-06-26 NOTE — ED Notes (Signed)
Patient c/o mid-sternal chest pain that started 2 days ago. Per patient was sitting at desk at work when it started. Denies radiating pain, shortness of breath, n/v, or dizziness. Patient reports pain worse with deep breath.

## 2013-06-26 NOTE — Progress Notes (Signed)
UR chart review completed.  

## 2013-06-26 NOTE — ED Provider Notes (Signed)
CSN: [id number]     Arrival date & time 06/26/13  1108 History   First MD Initiated Contact with Patient 06/26/13 1135     Chief Complaint  Patient presents with  . Chest Pain      HPI Pt was seen at 1135. Per pt, c/o gradual onset and persistence of constant chest "pain" for the past 2 days. Pt describes the CP as "indigestion," and "sharp at times." Denies hx of same. Pt denies palpitations, no SOB/cough, no abd pain, no N/V/D, no back pain, no fevers, no calf/LE pain or unilateral swelling, no recent long travel.    Past Medical History  Diagnosis Date  . Diabetes mellitus     Type II  . Hypertension   . Obesity   . PMB (postmenopausal bleeding) 07/24/2012    Had 16.1 mm endometrium on [place] will get endo biopsy  . Psoriasis   . Breast cancer 2007    left   Past Surgical History  Procedure Laterality Date  . Cesarean section  1995  . Breast surgery  02/20/06    left side   . Hysteroscopy w/d&c N/A 08/13/2012    Procedure: DILATATION AND CURETTAGE /HYSTEROSCOPY;  Surgeon: Florian Buff, MD;  Location: [facility];  Service: Gynecology;  Laterality: N/A;   Family History  Problem Relation Age of Onset  . Heart failure Father   . CAD Father   . Cancer Sister   . Diabetes Sister   . Hypertension Other   . Alzheimer's disease Mother   . Diabetes Sister    History  Substance Use Topics  . Smoking status: Former Smoker -- 0.01 packs/day for 1 years    Types: Cigarettes    Quit date: 03/06/2003  . Smokeless tobacco: Never Used  . Alcohol Use: No   OB History   Grav Para Term Preterm Abortions TAB SAB Ect Mult Living   2 2 1       1      Review of Systems ROS: Statement: All systems negative except as marked or noted in the HPI; Constitutional: Negative for fever and chills. ; ; Eyes: Negative for eye pain, redness and discharge. ; ; ENMT: Negative for ear pain, hoarseness, nasal congestion, sinus pressure and sore throat. ; ; Cardiovascular: +CP. Negative for palpitations,  diaphoresis, dyspnea and peripheral edema. ; ; Respiratory: Negative for cough, wheezing and stridor. ; ; Gastrointestinal: Negative for nausea, vomiting, diarrhea, abdominal pain, blood in stool, hematemesis, jaundice and rectal bleeding. . ; ; Genitourinary: Negative for dysuria, flank pain and hematuria. ; ; Musculoskeletal: Negative for back pain and neck pain. Negative for swelling and trauma.; ; Skin: Negative for pruritus, rash, abrasions, blisters, bruising and skin lesion.; ; Neuro: Negative for headache, lightheadedness and neck stiffness. Negative for weakness, altered level of consciousness , altered mental status, extremity weakness, paresthesias, involuntary movement, seizure and syncope.      Allergies  Review of patient's allergies indicates no known allergies.  Home Medications   Prior to Admission medications   Medication Sig Start Date End Date Taking? Authorizing Provider  aspirin EC 81 MG tablet Take 81 mg by mouth daily.    Yes Historical Provider, MD  ibuprofen (ADVIL,MOTRIN) 200 MG tablet Take 600 mg by mouth every 6 (six) hours as needed for moderate pain.   Yes Historical Provider, MD  lisinopril-hydrochlorothiazide (PRINZIDE,ZESTORETIC) 20-12.5 MG per tablet Take 1 tablet by mouth daily.   Yes Historical Provider, MD  megestrol (MEGACE) 40 MG tablet Take 40  mg by mouth daily.   Yes Historical Provider, MD  metFORMIN (GLUCOPHAGE-XR) 500 MG 24 hr tablet Take 500 mg by mouth 2 (two) times daily.   Yes Historical Provider, MD  Multiple Vitamin (MULTIVITAMIN) tablet Take 1 tablet by mouth daily.   Yes Historical Provider, MD  pravastatin (PRAVACHOL) 40 MG tablet Take 40 mg by mouth at bedtime.   Yes Historical Provider, MD   BP 120/69  Pulse 176  Temp(Src) 97.8 F (36.6 C) (Oral)  Resp 23  Ht 5\' 2"  (1.575 m)  Wt 280 lb (127.007 kg)  BMI 51.20 kg/m2  SpO2 98%  LMP 09/02/2012 Physical Exam 1140: Physical examination:  Nursing notes reviewed; Vital signs and O2 SAT  reviewed;  Constitutional: Well developed, Well nourished, Well hydrated, In no acute distress; Head:  Normocephalic, atraumatic; Eyes: EOMI, PERRL, No scleral icterus; ENMT: Mouth and pharynx normal, Mucous membranes moist; Neck: Supple, Full range of motion, No lymphadenopathy; Cardiovascular: Irregular irregular rhythm and tachycardic rate, No gallop; Respiratory: Breath sounds clear & equal bilaterally, No rales, rhonchi, wheezes.  Speaking full sentences with ease, Normal respiratory effort/excursion; Chest: Nontender, Movement normal; Abdomen: Soft, Nontender, Nondistended, Normal bowel sounds; Genitourinary: No CVA tenderness; Extremities: Pulses normal, No tenderness, No edema, No calf edema or asymmetry.; Neuro: AA&Ox3, Major CN grossly intact.  Speech clear. No gross focal motor or sensory deficits in extremities.; Skin: Color normal, Warm, Dry.   ED Course  Procedures   1145:  Monitor with new afib/RVR, rates 170-190's. Will start IV cardizem bolus and gtt.  1254:  IV cardizem gtt being titrated up. Monitor continues afib/RVR, rates 140-150's. Will give 2nd bolus dose of IV cardizem. Pt continues to deny palpitations/SOB. States she is "ok." Mentating per baseline with her family at bedside.   1315:  IV cardizem titrated up to 15mg /hr. SBP will drop into high 90's/low 100's with increase back to 100-110's after judicious IVF bolus.  Will give a 3rd bolus dose of IV cardizem. Dx and testing d/w pt and family.  Questions answered.  Verb understanding, agreeable to admit.  T/C to [facility] Cards Dr. Harl Bowie, case discussed, including:  HPI, pertinent PM/SHx, VS/PE, dx testing, ED course and treatment:  Agrees with ED workup/treatment, ok with 3rd bolus of IV cardizem and recheck; if does not further decrease HR will likely need IV digoxin; Cards will consult. T/C to [facility] Dr. Jerilee Hoh, case discussed, including:  HPI, pertinent PM/SHx, VS/PE, dx testing, ED course and treatment, as well as d/w Cards MD:   Agreeable to admit, requests to write temporary orders, obtain stepdown bed to team 2.    EKG Interpretation None      MDM  MDM Reviewed: previous chart, nursing note and vitals Reviewed previous: labs and ECG Interpretation: labs, ECG and x-ray Total time providing critical care: 30-74 minutes. This excludes time spent performing separately reportable procedures and services. Consults: admitting MD    CRITICAL CARE Performed by: Alfonzo Feller Total critical care time: 33 Critical care time was exclusive of separately billable procedures and treating other patients. Critical care was necessary to treat or prevent imminent or life-threatening deterioration. Critical care was time spent personally by me on the following activities: development of treatment plan with patient and/or surrogate as well as nursing, discussions with consultants, evaluation of patient's response to treatment, examination of patient, obtaining history from patient or surrogate, ordering and performing treatments and interventions, ordering and review of laboratory studies, ordering and review of radiographic studies, pulse oximetry and re-evaluation  of patient's condition.    Date: 06/26/2013 on arrival  Rate: 182  Rhythm: atrial fibrillation  QRS Axis: normal  Intervals: normal  ST/T Wave abnormalities: nonspecific ST/T changes  Conduction Disutrbances:none  Narrative Interpretation:   Old EKG Reviewed: changes noted; new afib compared to previous EKG dated 10/03/2011.    Date: 06/26/2013 after IV cardizem bolus/gtt  Rate: 146  Rhythm: atrial fibrillation  QRS Axis: normal  Intervals: normal  ST/T Wave abnormalities: normal  Conduction Disutrbances:none  Narrative Interpretation:   Old EKG Reviewed: changes noted, rate slower than previous EKG completed today.        Results for orders placed during the hospital encounter of 06/26/13  CBC WITH DIFFERENTIAL      Result Value Ref Range    WBC 8.9  4.0 - 10.5 K/uL   RBC 3.67 (*) 3.87 - 5.11 MIL/uL   Hemoglobin 11.0 (*) 12.0 - 15.0 g/dL   HCT 32.7 (*) 36.0 - 46.0 %   MCV 89.1  78.0 - 100.0 fL   MCH 30.0  26.0 - 34.0 pg   MCHC 33.6  30.0 - 36.0 g/dL   RDW 12.9  11.5 - 15.5 %   Platelets 196  150 - 400 K/uL   Neutrophils Relative % 67  43 - 77 %   Neutro Abs 6.0  1.7 - 7.7 K/uL   Lymphocytes Relative 21  12 - 46 %   Lymphs Abs 1.8  0.7 - 4.0 K/uL   Monocytes Relative 11  3 - 12 %   Monocytes Absolute 1.0  0.1 - 1.0 K/uL   Eosinophils Relative 1  0 - 5 %   Eosinophils Absolute 0.1  0.0 - 0.7 K/uL   Basophils Relative 0  0 - 1 %   Basophils Absolute 0.0  0.0 - 0.1 K/uL  BASIC METABOLIC PANEL      Result Value Ref Range   Sodium 141  137 - 147 mEq/L   Potassium 4.5  3.7 - 5.3 mEq/L   Chloride 106  96 - 112 mEq/L   CO2 17 (*) 19 - 32 mEq/L   Glucose, Bld 153 (*) 70 - 99 mg/dL   BUN 45 (*) 6 - 23 mg/dL   Creatinine, Ser 1.06  0.50 - 1.10 mg/dL   Calcium 10.9 (*) 8.4 - 10.5 mg/dL   GFR calc non Af Amer 58 (*) >90 mL/min   GFR calc Af Amer 67 (*) >90 mL/min  TROPONIN I      Result Value Ref Range   Troponin I <0.30  <0.30 ng/mL  MAGNESIUM      Result Value Ref Range   Magnesium 1.6  1.5 - 2.5 mg/dL   Dg Chest Port 1 View 06/26/2013   CLINICAL DATA:  Chest pain  EXAM: PORTABLE CHEST - 1 VIEW  COMPARISON:  02/15/06  FINDINGS: The heart size and mediastinal contours are within normal limits. Both lungs are clear. The visualized skeletal structures are unremarkable.  IMPRESSION: No active disease.   Electronically Signed   By: Inez Catalina M.D.   On: 06/26/2013 11:52      Alfonzo Feller, DO 06/26/13 1649

## 2013-06-26 NOTE — Progress Notes (Signed)
Pt on cardizem gtt @ 30mL/ hr. HR continues to be in 120's to 140's. BP 80's/40's-50's. Jonette Eva, NP paged. New orders received to begin amiodarone gtt and dc cardizem. Will continue to monitor.

## 2013-06-26 NOTE — H&P (Signed)
[facility] History and Physical  Kaitlyn Hull XLK:[id number] DOB: 1955/08/02 DOA: 06/26/2013  Referring physician: Thurnell Hull PCP: Kaitlyn Lair, MD   Chief Complaint:   HPI: Kaitlyn Hull is a 58 y.o. female with a past medical history that includes diabetes, hypertension, obesity, breast cancer status post lumpectomy 2007 presents to the emergency department with the chief complaint 2 day history of chest pain. She reports that 2 days ago she developed sudden substernal chest pain while she was sitting at her desk at work. She describes the pain as sharp and somewhat worse with inspiration. She does indigestion so she took some times and believes that she got some relief. She denies palpitations shortness of breath diaphoresis nausea vomiting abdominal pain diarrhea. She denies dysuria hematuria frequency or urgency. She denies fever chills cough headache syncope or near-syncope. She denies orthopnea. He indicates that sitting up straight makes the pain better. This morning when it was obvious the pain was not getting better she came to the emergency department. Initial workup in the emergency department reveals atrial fibrillation with rapid ventricular response. Other vital signs are stable to slightly soft blood pressure, she is afebrile and not hypoxic. Lab work significant for a serum glucose of 153, BUN of 45, hemoglobin 11 otherwise unremarkable. Initial troponin negative. Chest x-ray reveals no cardiopulmonary process. In the emergency department she is given Cardizem bolus x5 with no improvement and a Cardizem continuous infusion was initiated. She was evaluated by cardiology in the emergency department and digoxin was ordered as well.    Review of Systems:  10 point review of systems completed all systems are negative except as indicated in the history of present illness  Past Medical History  Diagnosis Date  . Diabetes mellitus     Type II  . Hypertension   . Obesity   . PMB  (postmenopausal bleeding) 07/24/2012    Had 16.1 mm endometrium on [place] will get endo biopsy  . Psoriasis   . Breast cancer 2007    left   Past Surgical History  Procedure Laterality Date  . Cesarean section  1995  . Breast surgery  02/20/06    left side   . Hysteroscopy w/d&c N/A 08/13/2012    Procedure: DILATATION AND CURETTAGE /HYSTEROSCOPY;  Surgeon: Kaitlyn Buff, MD;  Location: [facility];  Service: Gynecology;  Laterality: N/A;   Social History:  reports that she quit smoking about 10 years ago. Her smoking use included Cigarettes. She has a .01 pack-year smoking history. She has never used smokeless tobacco. She reports that she does not drink alcohol or use illicit drugs.  No Known Allergies  Family History  Problem Relation Age of Onset  . Heart failure Father   . CAD Father   . Cancer Sister   . Diabetes Sister   . Hypertension Other   . Alzheimer's disease Mother   . Diabetes Sister      Prior to Admission medications   Medication Sig Start Date End Date Taking? Authorizing Provider  aspirin EC 81 MG tablet Take 81 mg by mouth daily.    Yes Historical Provider, MD  ibuprofen (ADVIL,MOTRIN) 200 MG tablet Take 600 mg by mouth every 6 (six) hours as needed for moderate pain.   Yes Historical Provider, MD  lisinopril-hydrochlorothiazide (PRINZIDE,ZESTORETIC) 20-12.5 MG per tablet Take 1 tablet by mouth daily.   Yes Historical Provider, MD  megestrol (MEGACE) 40 MG tablet Take 40 mg by mouth daily.   Yes Historical Provider, MD  metFORMIN (GLUCOPHAGE-XR) 500 MG 24 hr tablet Take 500 mg by mouth 2 (two) times daily.   Yes Historical Provider, MD  Multiple Vitamin (MULTIVITAMIN) tablet Take 1 tablet by mouth daily.   Yes Historical Provider, MD  pravastatin (PRAVACHOL) 40 MG tablet Take 40 mg by mouth at bedtime.   Yes Historical Provider, MD   Physical Exam: Filed Vitals:   06/26/13 1330  BP: 94/47  Pulse: 142  Temp:   Resp: 33    BP 94/47  Pulse 142  Temp(Src) 97.8 F  (36.6 C) (Oral)  Resp 33  Ht 5\' 2"  (1.575 m)  Wt 127.007 kg (280 lb)  BMI 51.20 kg/m2  SpO2 98%  LMP 09/02/2012  General:  Appears calm and comfortable Eyes: PERRL, normal lids, irises & conjunctiva ENT: grossly normal hearing, lips & tongue Neck: no LAD, masses or thyromegaly Cardiovascular: Irregularly irregular and tachycardic, I hear no murmur no gallop no rub. There is trace lower extremity edema bilaterally. Pedal pulses are present and palpable Respiratory: Slightly tachypneic, breath sounds are clear bilaterally I hear no wheeze no rhonchi no crackles Abdomen: Obese nondistended nontender to palpation. No mass organomegaly noted. Positive bowel sounds throughout Skin: no rash or induration seen on limited exam Musculoskeletal: grossly normal tone BUE/BLE Psychiatric: grossly normal mood and affect, speech fluent and appropriate Neurologic: grossly non-focal. Speech is clear facial symmetry           Labs on Admission:  Basic Metabolic Panel:  Recent Labs Lab 06/26/13 1144  NA 141  K 4.5  CL 106  CO2 17*  GLUCOSE 153*  BUN 45*  CREATININE 1.06  CALCIUM 10.9*  MG 1.6   Liver Function Tests: No results found for this basename: AST, ALT, ALKPHOS, BILITOT, PROT, ALBUMIN,  in the last 168 hours No results found for this basename: LIPASE, AMYLASE,  in the last 168 hours No results found for this basename: AMMONIA,  in the last 168 hours CBC:  Recent Labs Lab 06/26/13 1144  WBC 8.9  NEUTROABS 6.0  HGB 11.0*  HCT 32.7*  MCV 89.1  PLT 196   Cardiac Enzymes:  Recent Labs Lab 06/26/13 1144  TROPONINI <0.30    BNP (last 3 results) No results found for this basename: PROBNP,  in the last 8760 hours CBG: No results found for this basename: GLUCAP,  in the last 168 hours  Radiological Exams on Admission: Dg Chest Port 1 View  06/26/2013   CLINICAL DATA:  Chest pain  EXAM: PORTABLE CHEST - 1 VIEW  COMPARISON:  02/15/06  FINDINGS: The heart size and  mediastinal contours are within normal limits. Both lungs are clear. The visualized skeletal structures are unremarkable.  IMPRESSION: No active disease.   Electronically Signed   By: Kaitlyn Hull M.D.   On: 06/26/2013 11:52    EKG: Independently reviewed atrial fibrillation with rapid ventricular response  Assessment/Plan Principal Problem:   Atrial fibrillation with rapid ventricular response: New-onset. Will admit to ICU. Will continue Cardizem drip. Patient is evaluated by cardiology who ordered digoxin as well. CHAD@ score 3 per cards. maglevel 1.6. Magnesium supplement per cards.  Will obtain a TSH and a 2-D echo. We'll cycle cardiac enzymes. Will provide pain medicine as needed.  Active Problems: Diabetes: Patient on metformin only at home. Will provide a car modified diet. Will monitor CBGs and use sliding scale insulin for optimal control. Will obtain a hemoglobin A 1C.    Hypertension: Slightly soft in the emergency department. Home medications  include lisinopril and  HCTZ. Will hold these for now.    Obesity: BMI 51.3 nutritional consult    History of breast cancer: Lumpectomy 2007. She was on tamoxifen for 3 years but stopped it approximately 3 years ago. Of note chart review indicates that she has been on prometrium in the past 2 provide endometrial suppression do to bleeding.     Dr Harl Bowie cardiolgoy    Code Status: full Family Communication: daughter at bedside Disposition Plan: home when ready  Time spent: 71 minutes  [facility]  Patient seen and examined, database reviewed. Discussed with multiple family members at bedside. Agree with above note by Kaitlyn Carrel, NP. She presents with CP and was found to be in new-onset atrial fibrillation with RVR with rates in the 180s. Is currently on a cardizem drip. Has been evaluated by cardiology who has initiated a digoxin load as well. HR down to 130-140s currently. Has been started on eliquis given CHADS  of 3. Agree with ICU admission overnight. Cycle troponins, check TSH and 2D ECHO. Will continue to follow.  Domingo Mend, MD [facility] Pager: [phone]

## 2013-06-26 NOTE — Progress Notes (Signed)
Pt's BP consistently low. Paged M. Lynch; received order for fluid bolus. Continuing to monitor.

## 2013-06-27 LAB — BASIC METABOLIC PANEL
BUN: 41 mg/dL — ABNORMAL HIGH (ref 6–23)
CO2: 17 mEq/L — ABNORMAL LOW (ref 19–32)
Calcium: 9.1 mg/dL (ref 8.4–10.5)
Chloride: 110 mEq/L (ref 96–112)
Creatinine, Ser: 1.12 mg/dL — ABNORMAL HIGH (ref 0.50–1.10)
GFR calc Af Amer: 62 mL/min — ABNORMAL LOW (ref >90)
GFR, EST NON AFRICAN AMERICAN: 54 mL/min — AB (ref >90)
GLUCOSE: 137 mg/dL — AB (ref 70–99)
Potassium: 4.3 mEq/L (ref 3.7–5.3)
SODIUM: 141 meq/L (ref 137–147)

## 2013-06-27 LAB — GLUCOSE, CAPILLARY
GLUCOSE-CAPILLARY: 134 mg/dL — AB (ref 70–99)
GLUCOSE-CAPILLARY: 116 mg/dL — AB (ref 70–99)
Glucose-Capillary: 121 mg/dL — ABNORMAL HIGH (ref 70–99)
Glucose-Capillary: 152 mg/dL — ABNORMAL HIGH (ref 70–99)

## 2013-06-27 LAB — TSH: TSH: 0.011 u[IU]/mL — ABNORMAL LOW (ref 0.350–4.500)

## 2013-06-27 LAB — HEMOGLOBIN A1C
Hgb A1c MFr Bld: 6.3 % — ABNORMAL HIGH (ref <5.7)
MEAN PLASMA GLUCOSE: 134 mg/dL — AB (ref <117)

## 2013-06-27 LAB — CBC
HCT: 27.5 % — ABNORMAL LOW (ref 36.0–46.0)
Hemoglobin: 9.3 g/dL — ABNORMAL LOW (ref 12.0–15.0)
MCH: 30.3 pg (ref 26.0–34.0)
MCHC: 33.8 g/dL (ref 30.0–36.0)
MCV: 89.6 fL (ref 78.0–100.0)
PLATELETS: 144 10*3/uL — AB (ref 150–400)
RBC: 3.07 MIL/uL — ABNORMAL LOW (ref 3.87–5.11)
RDW: 13.2 % (ref 11.5–15.5)
WBC: 4.7 10*3/uL (ref 4.0–10.5)

## 2013-06-27 LAB — TROPONIN I: Troponin I: <0.3 ng/mL (ref <0.30)

## 2013-06-27 MED ORDER — METOPROLOL TARTRATE 25 MG PO TABS
12.5000 mg | ORAL_TABLET | Freq: Two times a day (BID) | ORAL | Status: DC
  Administered 2013-06-27 – 2013-06-28 (×3): 12.5 mg via ORAL
  Filled 2013-06-27 (×3): qty 1

## 2013-06-27 MED ORDER — SODIUM CHLORIDE 0.9 % IV BOLUS (SEPSIS)
1000.0000 mL | Freq: Once | INTRAVENOUS | Status: AC
  Administered 2013-06-27: 1000 mL via INTRAVENOUS

## 2013-06-27 NOTE — Progress Notes (Signed)
2D Echocardiogram has been performed.  Doyle Askew 06/27/2013, 8:55 AM

## 2013-06-27 NOTE — Progress Notes (Addendum)
[facility] PROGRESS NOTE  Kaitlyn Hull VWU:[id number] DOB: 14-Jan-1956 DOA: 06/26/2013 PCP: Deloria Lair, MD  Assessment/Plan: New Onset A Fib with RVR -Rate still elevated at 120-140. -Cardizem drip was discontinued given hypotension. -Amio drip started. -Also loaded on digoxin by cards 4/24. -BP now in the 110s. -Will start low dose PO metoprolol to assist with rate control, mindful of BP. -ECHO pending. -Troponins negative. -TSH pending. -On eliquis for stroke prevention.  DM II -Well controlled. -Continue current management.  Breast Cancer -Continue OP follow up with oncology as scheduled.  Code Status: Full Code Family Communication: Patient only  Disposition Plan: Home when ready. Keep in ICU today.   Consultants:  Cardiology   Antibiotics:  None   Subjective: Feels much better today. No CP/SOB/palpitations.  Objective: Filed Vitals:   06/27/13 0700 06/27/13 0730 06/27/13 0800 06/27/13 0900  BP: 96/51  81/56   Pulse: 100  108 145  Temp:  98 F (36.7 C)    TempSrc:  Oral    Resp: 21  15   Height:      Weight:      SpO2: 100%  100%     Intake/Output Summary (Last 24 hours) at 06/27/13 0920 Last data filed at 06/27/13 0800  Gross per 24 hour  Intake 1645.78 ml  Output      0 ml  Net 1645.78 ml   Filed Weights   06/26/13 1125 06/26/13 1453  Weight: 127.007 kg (280 lb) 129.2 kg (284 lb 13.4 oz)    Exam:   General:  AA Ox3  Cardiovascular: tachy, irregular  Respiratory: CTA B  Abdomen: S/NT/ND/+BS  Extremities: trace bilateral edema.   Neurologic:  Intact and non-focal.  Data Reviewed: Basic Metabolic Panel:  Recent Labs Lab 06/26/13 1144 06/27/13 0532  NA 141 141  K 4.5 4.3  CL 106 110  CO2 17* 17*  GLUCOSE 153* 137*  BUN 45* 41*  CREATININE 1.06 1.12*  CALCIUM 10.9* 9.1  MG 1.6  --    Liver Function Tests: No results found for this basename: AST, ALT, ALKPHOS, BILITOT, PROT, ALBUMIN,  in the last 168  hours No results found for this basename: LIPASE, AMYLASE,  in the last 168 hours No results found for this basename: AMMONIA,  in the last 168 hours CBC:  Recent Labs Lab 06/26/13 1144 06/27/13 0532  WBC 8.9 4.7  NEUTROABS 6.0  --   HGB 11.0* 9.3*  HCT 32.7* 27.5*  MCV 89.1 89.6  PLT 196 144*   Cardiac Enzymes:  Recent Labs Lab 06/26/13 1144 06/26/13 1755 06/26/13 2330 06/27/13 0532  TROPONINI <0.30 <0.30 <0.30 <0.30   BNP (last 3 results) No results found for this basename: PROBNP,  in the last 8760 hours CBG:  Recent Labs Lab 06/26/13 1632 06/26/13 2055 06/27/13 0732  GLUCAP 128* 149* 121*    Recent Results (from the past 240 hour(s))  MRSA PCR SCREENING     Status: None   Collection Time    06/26/13  3:00 PM      Result Value Ref Range Status   MRSA by PCR NEGATIVE  NEGATIVE Final   Comment:            The GeneXpert MRSA Assay (FDA     approved for NASAL specimens     only), is one component of a     comprehensive MRSA colonization     surveillance program. It is not     intended to diagnose MRSA  infection nor to guide or     monitor treatment for     MRSA infections.     Studies: Dg Chest Port 1 View  06/26/2013   CLINICAL DATA:  Chest pain  EXAM: PORTABLE CHEST - 1 VIEW  COMPARISON:  02/15/06  FINDINGS: The heart size and mediastinal contours are within normal limits. Both lungs are clear. The visualized skeletal structures are unremarkable.  IMPRESSION: No active disease.   Electronically Signed   By: Inez Catalina M.D.   On: 06/26/2013 11:52    Scheduled Meds: . apixaban  5 mg Oral BID  . digoxin  0.125 mg Oral Daily  . insulin aspart  0-15 Units Subcutaneous TID WC  . insulin aspart  0-5 Units Subcutaneous QHS  . senna  1 tablet Oral BID  . simvastatin  5 mg Oral q1800   Continuous Infusions: . sodium chloride 75 mL/hr at 06/26/13 1151  . sodium chloride 50 mL/hr at 06/26/13 2000  . amiodarone (NEXTERONE PREMIX) 360 mg/200 mL  dextrose 30 mg/hr (06/27/13 0859)  . diltiazem (CARDIZEM) infusion Stopped (06/26/13 2101)    Principal Problem:   Atrial fibrillation with rapid ventricular response Active Problems:   History of breast cancer   Diabetes   Hypertension   Obesity   New onset a-fib    Time spent: 35 minutes. Greater than 50% of this time was spent in direct contact with the patient coordinating care.    [facility] Pager [phone]  If 7PM-7AM, please contact night-coverage at [email or web address], password [facility] 06/27/2013, 9:20 AM  LOS: 1 day

## 2013-06-28 LAB — GLUCOSE, CAPILLARY
GLUCOSE-CAPILLARY: 115 mg/dL — AB (ref 70–99)
GLUCOSE-CAPILLARY: 111 mg/dL — AB (ref 70–99)
Glucose-Capillary: 150 mg/dL — ABNORMAL HIGH (ref 70–99)

## 2013-06-28 LAB — BASIC METABOLIC PANEL
BUN: 28 mg/dL — ABNORMAL HIGH (ref 6–23)
CALCIUM: 9.5 mg/dL (ref 8.4–10.5)
CO2: 18 mEq/L — ABNORMAL LOW (ref 19–32)
Chloride: 112 mEq/L (ref 96–112)
Creatinine, Ser: 0.82 mg/dL (ref 0.50–1.10)
GFR calc Af Amer: >90 mL/min (ref >90)
GFR, EST NON AFRICAN AMERICAN: 79 mL/min — AB (ref >90)
Glucose, Bld: 129 mg/dL — ABNORMAL HIGH (ref 70–99)
Potassium: 4.5 mEq/L (ref 3.7–5.3)
SODIUM: 143 meq/L (ref 137–147)

## 2013-06-28 LAB — CBC
HCT: 29.1 % — ABNORMAL LOW (ref 36.0–46.0)
HEMOGLOBIN: 9.7 g/dL — AB (ref 12.0–15.0)
MCH: 29.7 pg (ref 26.0–34.0)
MCHC: 33.3 g/dL (ref 30.0–36.0)
MCV: 89 fL (ref 78.0–100.0)
PLATELETS: 160 10*3/uL (ref 150–400)
RBC: 3.27 MIL/uL — ABNORMAL LOW (ref 3.87–5.11)
RDW: 13.1 % (ref 11.5–15.5)
WBC: 5.9 10*3/uL (ref 4.0–10.5)

## 2013-06-28 LAB — T4, FREE: Free T4: 4.18 ng/dL — ABNORMAL HIGH (ref 0.80–1.80)

## 2013-06-28 MED ORDER — METOPROLOL TARTRATE 25 MG PO TABS
25.0000 mg | ORAL_TABLET | Freq: Two times a day (BID) | ORAL | Status: DC
  Administered 2013-06-28 – 2013-06-30 (×4): 25 mg via ORAL
  Filled 2013-06-28 (×4): qty 1

## 2013-06-28 NOTE — Progress Notes (Signed)
[facility] PROGRESS NOTE  Kaitlyn Hull:[id number] DOB: 1955-04-25 DOA: 06/26/2013 PCP: Deloria Lair, MD  Assessment/Plan: New Onset A Fib with RVR -Rate still elevated at 110's-120's. -Cardizem drip was discontinued given hypotension. -Currently on amio gtt. -Also loaded on digoxin by cards 4/24. -BP stable in the 110s. -Started low dose PO metoprolol to assist with rate control. Will increase to 25mg  as tolerated -ECHO with mild MR, otherwise unremarkable -Troponins serially negative. -TSH 0.011 - will check free T4 -On eliquis for stroke prevention.  DM II -Well controlled. -Continue current management.  Breast Cancer -Continue OP follow up with oncology as scheduled.  Code Status: Full Code Family Communication: Patient only  Disposition Plan: Home when ready. Keep in ICU today.   Consultants:  Cardiology  Antibiotics:  None   Subjective: No complaints. No acute events overnight  Objective: Filed Vitals:   06/28/13 0400 06/28/13 0500 06/28/13 0700 06/28/13 0800  BP:   105/70 112/75  Pulse:   102 126  Temp: 97.8 F (36.6 C)     TempSrc: Oral     Resp:   22 20  Height:      Weight:  130.3 kg (287 lb 4.2 oz)    SpO2:   100% 100%    Intake/Output Summary (Last 24 hours) at 06/28/13 0831 Last data filed at 06/28/13 0700  Gross per 24 hour  Intake 1584.1 ml  Output      0 ml  Net 1584.1 ml   Filed Weights   06/26/13 1125 06/26/13 1453 06/28/13 0500  Weight: 127.007 kg (280 lb) 129.2 kg (284 lb 13.4 oz) 130.3 kg (287 lb 4.2 oz)    Exam:   General:  AA Ox3  Cardiovascular: tachy, irregular  Respiratory: CTA B  Abdomen: S/NT/ND/+BS  Extremities: trace bilateral edema.   Neurologic:  Intact and non-focal.  Data Reviewed: Basic Metabolic Panel:  Recent Labs Lab 06/26/13 1144 06/27/13 0532 06/28/13 0458  NA 141 141 143  K 4.5 4.3 4.5  CL 106 110 112  CO2 17* 17* 18*  GLUCOSE 153* 137* 129*  BUN 45* 41* 28*    CREATININE 1.06 1.12* 0.82  CALCIUM 10.9* 9.1 9.5  MG 1.6  --   --    Liver Function Tests: No results found for this basename: AST, ALT, ALKPHOS, BILITOT, PROT, ALBUMIN,  in the last 168 hours No results found for this basename: LIPASE, AMYLASE,  in the last 168 hours No results found for this basename: AMMONIA,  in the last 168 hours CBC:  Recent Labs Lab 06/26/13 1144 06/27/13 0532 06/28/13 0458  WBC 8.9 4.7 5.9  NEUTROABS 6.0  --   --   HGB 11.0* 9.3* 9.7*  HCT 32.7* 27.5* 29.1*  MCV 89.1 89.6 89.0  PLT 196 144* 160   Cardiac Enzymes:  Recent Labs Lab 06/26/13 1144 06/26/13 1755 06/26/13 2330 06/27/13 0532 06/27/13 1133  TROPONINI <0.30 <0.30 <0.30 <0.30 <0.30   BNP (last 3 results) No results found for this basename: PROBNP,  in the last 8760 hours CBG:  Recent Labs Lab 06/27/13 0732 06/27/13 1154 06/27/13 1642 06/27/13 2132 06/28/13 0734  GLUCAP 121* 152* 134* 116* 115*    Recent Results (from the past 240 hour(s))  MRSA PCR SCREENING     Status: None   Collection Time    06/26/13  3:00 PM      Result Value Ref Range Status   MRSA by PCR NEGATIVE  NEGATIVE Final   Comment:  The GeneXpert MRSA Assay (FDA     approved for NASAL specimens     only), is one component of a     comprehensive MRSA colonization     surveillance program. It is not     intended to diagnose MRSA     infection nor to guide or     monitor treatment for     MRSA infections.     Studies: Dg Chest Port 1 View  06/26/2013   CLINICAL DATA:  Chest pain  EXAM: PORTABLE CHEST - 1 VIEW  COMPARISON:  02/15/06  FINDINGS: The heart size and mediastinal contours are within normal limits. Both lungs are clear. The visualized skeletal structures are unremarkable.  IMPRESSION: No active disease.   Electronically Signed   By: Inez Catalina M.D.   On: 06/26/2013 11:52    Scheduled Meds: . apixaban  5 mg Oral BID  . digoxin  0.125 mg Oral Daily  . insulin aspart  0-15 Units  Subcutaneous TID WC  . insulin aspart  0-5 Units Subcutaneous QHS  . metoprolol tartrate  12.5 mg Oral BID  . senna  1 tablet Oral BID  . simvastatin  5 mg Oral q1800   Continuous Infusions: . sodium chloride 75 mL/hr at 06/26/13 1151  . sodium chloride 50 mL/hr at 06/26/13 2000  . amiodarone (NEXTERONE PREMIX) 360 mg/200 mL dextrose 30 mg/hr (06/27/13 2201)  . diltiazem (CARDIZEM) infusion Stopped (06/26/13 2101)    Principal Problem:   Atrial fibrillation with rapid ventricular response Active Problems:   History of breast cancer   Diabetes   Hypertension   Obesity   New onset a-fib    Time spent: 35 minutes. Greater than 50% of this time was spent in direct contact with the patient coordinating care.    [facility] Pager [phone]  If 7PM-7AM, please contact night-coverage at [email or web address], password [facility] 06/28/2013, 8:31 AM  LOS: 2 days

## 2013-06-29 DIAGNOSIS — E0590 Thyrotoxicosis, unspecified without thyrotoxic crisis or storm: Secondary | ICD-10-CM

## 2013-06-29 DIAGNOSIS — E669 Obesity, unspecified: Secondary | ICD-10-CM

## 2013-06-29 DIAGNOSIS — I959 Hypotension, unspecified: Secondary | ICD-10-CM

## 2013-06-29 LAB — GLUCOSE, CAPILLARY
GLUCOSE-CAPILLARY: 111 mg/dL — AB (ref 70–99)
GLUCOSE-CAPILLARY: 156 mg/dL — AB (ref 70–99)
GLUCOSE-CAPILLARY: 130 mg/dL — AB (ref 70–99)
Glucose-Capillary: 114 mg/dL — ABNORMAL HIGH (ref 70–99)
Glucose-Capillary: 126 mg/dL — ABNORMAL HIGH (ref 70–99)

## 2013-06-29 LAB — BASIC METABOLIC PANEL
BUN: 22 mg/dL (ref 6–23)
CHLORIDE: 111 meq/L (ref 96–112)
CO2: 16 mEq/L — ABNORMAL LOW (ref 19–32)
CREATININE: 0.81 mg/dL (ref 0.50–1.10)
Calcium: 9.4 mg/dL (ref 8.4–10.5)
GFR calc Af Amer: >90 mL/min (ref >90)
GFR calc non Af Amer: 80 mL/min — ABNORMAL LOW (ref >90)
GLUCOSE: 124 mg/dL — AB (ref 70–99)
Potassium: 4.3 mEq/L (ref 3.7–5.3)
Sodium: 143 mEq/L (ref 137–147)

## 2013-06-29 MED ORDER — METHIMAZOLE 5 MG PO TABS
5.0000 mg | ORAL_TABLET | Freq: Two times a day (BID) | ORAL | Status: DC
  Administered 2013-06-29 – 2013-07-02 (×7): 5 mg via ORAL
  Filled 2013-06-29 (×12): qty 1

## 2013-06-29 MED ORDER — DIGOXIN 125 MCG PO TABS
0.1250 mg | ORAL_TABLET | Freq: Once | ORAL | Status: AC
  Administered 2013-06-29: 0.125 mg via ORAL
  Filled 2013-06-29: qty 1

## 2013-06-29 MED ORDER — DIGOXIN 250 MCG PO TABS
0.2500 mg | ORAL_TABLET | Freq: Every day | ORAL | Status: DC
  Administered 2013-06-30 – 2013-07-02 (×3): 0.25 mg via ORAL
  Filled 2013-06-29 (×3): qty 1

## 2013-06-29 NOTE — Progress Notes (Signed)
[facility] PROGRESS NOTE  Kaitlyn Hull EXB:[id number] DOB: 21-Feb-1956 DOA: 06/26/2013 PCP: Deloria Lair, MD  Assessment/Plan: New Onset A Fib with RVR -Rate still elevated at 110's-120's. -Cardizem drip was discontinued given hypotension. -Currently on amio gtt. -Also loaded on digoxin by cards 4/24. Dig dose increased 4/27. -BP stable in the 110s. -Metoprolol was increased to 25 mg BID on 4/28. -ECHO with mild MR, otherwise unremarkable -Troponins serially negative. -TSH/free T4 c/w hyperthyroidism: see below for details. -On eliquis for stroke prevention. -Cards considering TEE/DCCV later this week if no improvement with rate controlling meds.  Hyperthyroidism -Low TSH with high free T4. -Will start methimazole 5 mg BID. -Will need eventual OP endocrine follow up.  DM II -Well controlled. -Continue current management.  Breast Cancer -Continue OP follow up with oncology as scheduled.  Code Status: Full Code Family Communication: Patient only  Disposition Plan: Home when ready. Keep in ICU today.   Consultants:  Cardiology  Antibiotics:  None   Subjective: No complaints. No acute events overnight  Objective: Filed Vitals:   06/29/13 0700 06/29/13 0731 06/29/13 0800 06/29/13 0900  BP: 128/50  130/54 104/44  Pulse:      Temp:  98.3 F (36.8 C)    TempSrc:  Oral    Resp: 23  25 28   Height:      Weight:      SpO2:        Intake/Output Summary (Last 24 hours) at 06/29/13 1002 Last data filed at 06/29/13 0947  Gross per 24 hour  Intake 1273.7 ml  Output      0 ml  Net 1273.7 ml   Filed Weights   06/26/13 1453 06/28/13 0500 06/29/13 0500  Weight: 129.2 kg (284 lb 13.4 oz) 130.3 kg (287 lb 4.2 oz) 131.1 kg (289 lb 0.4 oz)    Exam:   General:  AA Ox3  Cardiovascular: tachy, irregular  Respiratory: CTA B  Abdomen: S/NT/ND/+BS  Extremities: trace bilateral edema.   Neurologic:  Intact and non-focal.  Data Reviewed: Basic  Metabolic Panel:  Recent Labs Lab 06/26/13 1144 06/27/13 0532 06/28/13 0458 06/29/13 0437  NA 141 141 143 143  K 4.5 4.3 4.5 4.3  CL 106 110 112 111  CO2 17* 17* 18* 16*  GLUCOSE 153* 137* 129* 124*  BUN 45* 41* 28* 22  CREATININE 1.06 1.12* 0.82 0.81  CALCIUM 10.9* 9.1 9.5 9.4  MG 1.6  --   --   --    Liver Function Tests: No results found for this basename: AST, ALT, ALKPHOS, BILITOT, PROT, ALBUMIN,  in the last 168 hours No results found for this basename: LIPASE, AMYLASE,  in the last 168 hours No results found for this basename: AMMONIA,  in the last 168 hours CBC:  Recent Labs Lab 06/26/13 1144 06/27/13 0532 06/28/13 0458  WBC 8.9 4.7 5.9  NEUTROABS 6.0  --   --   HGB 11.0* 9.3* 9.7*  HCT 32.7* 27.5* 29.1*  MCV 89.1 89.6 89.0  PLT 196 144* 160   Cardiac Enzymes:  Recent Labs Lab 06/26/13 1144 06/26/13 1755 06/26/13 2330 06/27/13 0532 06/27/13 1133  TROPONINI <0.30 <0.30 <0.30 <0.30 <0.30   BNP (last 3 results) No results found for this basename: PROBNP,  in the last 8760 hours CBG:  Recent Labs Lab 06/28/13 0734 06/28/13 1145 06/28/13 1655 06/28/13 2213 06/29/13 0730  GLUCAP 115* 150* 111* 114* 111*    Recent Results (from the past 240 hour(s))  MRSA  PCR SCREENING     Status: None   Collection Time    06/26/13  3:00 PM      Result Value Ref Range Status   MRSA by PCR NEGATIVE  NEGATIVE Final   Comment:            The GeneXpert MRSA Assay (FDA     approved for NASAL specimens     only), is one component of a     comprehensive MRSA colonization     surveillance program. It is not     intended to diagnose MRSA     infection nor to guide or     monitor treatment for     MRSA infections.     Studies: No results found.  Scheduled Meds: . apixaban  5 mg Oral BID  . digoxin  0.125 mg Oral Once  . [START ON 06/30/2013] digoxin  0.25 mg Oral Daily  . insulin aspart  0-15 Units Subcutaneous TID WC  . insulin aspart  0-5 Units  Subcutaneous QHS  . methimazole  5 mg Oral BID  . metoprolol tartrate  25 mg Oral BID  . senna  1 tablet Oral BID  . simvastatin  5 mg Oral q1800   Continuous Infusions: . sodium chloride 75 mL/hr at 06/26/13 1151  . sodium chloride 50 mL/hr at 06/29/13 0331  . amiodarone (NEXTERONE PREMIX) 360 mg/200 mL dextrose 30 mg/hr (06/29/13 0942)    Principal Problem:   Atrial fibrillation with rapid ventricular response Active Problems:   History of breast cancer   Diabetes   Hypertension   Obesity   New onset a-fib   Hyperthyroidism    Time spent: 35 minutes. Greater than 50% of this time was spent in direct contact with the patient coordinating care.    [facility] Pager [phone]  If 7PM-7AM, please contact night-coverage at [email or web address], password [facility] 06/29/2013, 10:02 AM  LOS: 3 days

## 2013-06-29 NOTE — Progress Notes (Signed)
SUBJECTIVE: Pt denies any further pleuritic chest pain, and also denies shortness of breath and leg swelling.     Intake/Output Summary (Last 24 hours) at 06/29/13 0943 Last data filed at 06/28/13 1836  Gross per 24 hour  Intake  973.7 ml  Output      0 ml  Net  973.7 ml    Current Facility-Administered Medications  Medication Dose Route Frequency Provider Last Rate Last Dose  . 0.9 %  sodium chloride infusion   Intravenous Continuous Alfonzo Feller, DO 75 mL/hr at 06/26/13 1151    . 0.9 %  sodium chloride infusion   Intravenous Continuous Radene Gunning, NP 50 mL/hr at 06/29/13 0331    . acetaminophen (TYLENOL) tablet 650 mg  650 mg Oral Q6H PRN Radene Gunning, NP   650 mg at 06/28/13 0502   Or  . acetaminophen (TYLENOL) suppository 650 mg  650 mg Rectal Q6H PRN Radene Gunning, NP      . alum & mag hydroxide-simeth (MAALOX/MYLANTA) 200-200-20 MG/5ML suspension 30 mL  30 mL Oral Q6H PRN Radene Gunning, NP      . amiodarone (NEXTERONE PREMIX) 360 mg/200 mL dextrose IV infusion  30 mg/hr Intravenous Continuous Ritta Slot, NP 16.7 mL/hr at 06/29/13 0942 30 mg/hr at 06/29/13 0942  . apixaban (ELIQUIS) tablet 5 mg  5 mg Oral BID Arnoldo Lenis, MD   5 mg at 06/29/13 0900  . bisacodyl (DULCOLAX) suppository 10 mg  10 mg Rectal Daily PRN Radene Gunning, NP      . digoxin (LANOXIN) tablet 0.125 mg  0.125 mg Oral Daily Arnoldo Lenis, MD   0.125 mg at 06/29/13 0900  . diltiazem (CARDIZEM) 100 mg in dextrose 5 % 100 mL infusion  5-15 mg/hr Intravenous Titrated Radene Gunning, NP   15 mg/hr at 06/26/13 2000  . HYDROcodone-acetaminophen (NORCO/VICODIN) 5-325 MG per tablet 1-2 tablet  1-2 tablet Oral Q4H PRN Radene Gunning, NP      . insulin aspart (novoLOG) injection 0-15 Units  0-15 Units Subcutaneous TID WC Radene Gunning, NP   2 Units at 06/28/13 1149  . insulin aspart (novoLOG) injection 0-5 Units  0-5 Units Subcutaneous QHS Lezlie Octave Black, NP      . metoprolol tartrate  (LOPRESSOR) tablet 25 mg  25 mg Oral BID Donne Hazel, MD   25 mg at 06/29/13 0900  . morphine 2 MG/ML injection 1 mg  1 mg Intravenous Q2H PRN Radene Gunning, NP      . ondansetron [facility]) tablet 4 mg  4 mg Oral Q6H PRN Radene Gunning, NP       Or  . ondansetron [facility]) injection 4 mg  4 mg Intravenous Q6H PRN Radene Gunning, NP      . senna (SENOKOT) tablet 8.6 mg  1 tablet Oral BID Lezlie Octave Black, NP   8.6 mg at 06/29/13 0900  . simvastatin (ZOCOR) tablet 5 mg  5 mg Oral q1800 Lezlie Octave Black, NP   5 mg at 06/28/13 1749    Filed Vitals:   06/29/13 0700 06/29/13 0731 06/29/13 0800 06/29/13 0900  BP: 128/50  130/54 104/44  Pulse:      Temp:  98.3 F (36.8 C)    TempSrc:  Oral    Resp: 23  25 28   Height:      Weight:      SpO2:  PHYSICAL EXAM General: NAD, obese Neck: No JVD, no thyromegaly.  Lungs: Clear to auscultation bilaterally with normal respiratory effort. CV: Nondisplaced PMI.  Irregular rhythm, tachycardic, normal S1/S2, no S3, no murmur.  No pretibial edema.  No carotid bruit.  Normal pedal pulses.  Abdomen: Soft, nontender, obese.  Neurologic: Alert and oriented x 3.  Psych: Normal affect. Extremities: No clubbing or cyanosis.   TELEMETRY: Reviewed telemetry pt in rapid atrial fibrillation.  LABS: Basic Metabolic Panel:  Recent Labs  06/26/13 1144  06/28/13 0458 06/29/13 0437  NA 141  < > 143 143  K 4.5  < > 4.5 4.3  CL 106  < > 112 111  CO2 17*  < > 18* 16*  GLUCOSE 153*  < > 129* 124*  BUN 45*  < > 28* 22  CREATININE 1.06  < > 0.82 0.81  CALCIUM 10.9*  < > 9.5 9.4  MG 1.6  --   --   --   < > = values in this interval not displayed. Liver Function Tests: No results found for this basename: AST, ALT, ALKPHOS, BILITOT, PROT, ALBUMIN,  in the last 72 hours No results found for this basename: LIPASE, AMYLASE,  in the last 72 hours CBC:  Recent Labs  06/26/13 1144 06/27/13 0532 06/28/13 0458  WBC 8.9 4.7 5.9  NEUTROABS 6.0  --   --   HGB  11.0* 9.3* 9.7*  HCT 32.7* 27.5* 29.1*  MCV 89.1 89.6 89.0  PLT 196 144* 160   Cardiac Enzymes:  Recent Labs  06/26/13 2330 06/27/13 0532 06/27/13 1133  TROPONINI <0.30 <0.30 <0.30   BNP: No components found with this basename: POCBNP,  D-Dimer: No results found for this basename: DDIMER,  in the last 72 hours Hemoglobin A1C:  Recent Labs  06/26/13 1432  HGBA1C 6.3*   Fasting Lipid Panel: No results found for this basename: CHOL, HDL, LDLCALC, TRIG, CHOLHDL, LDLDIRECT,  in the last 72 hours Thyroid Function Tests:  Recent Labs  06/26/13 1433  TSH 0.011*   Anemia Panel: No results found for this basename: VITAMINB12, FOLATE, FERRITIN, TIBC, IRON, RETICCTPCT,  in the last 72 hours  RADIOLOGY: Dg Chest Port 1 View  06/26/2013   CLINICAL DATA:  Chest pain  EXAM: PORTABLE CHEST - 1 VIEW  COMPARISON:  02/15/06  FINDINGS: The heart size and mediastinal contours are within normal limits. Both lungs are clear. The visualized skeletal structures are unremarkable.  IMPRESSION: No active disease.   Electronically Signed   By: Inez Catalina M.D.   On: 06/26/2013 11:52      ASSESSMENT AND PLAN: 1. Atrial fibrillation with rapid ventricular response: Currently asymptomatic. SBP's have been fluctuating from low 80's to 130 mmHg. Normal LV systolic function. Hyperthyroidism likely contributing. HR still elevated but better controlled. Metoprolol recently increased to 25 mg bid. Currently on amiodarone infusion. Diltiazem infusion previously discontinued due to low BP. Tolerating Eliquis for anticoagulation. Will increase digoxin to 0.25 mg daily. If additional medication titration fails to adequately control HR, may need TEE/DCCV later this week. 2. Hyperthyroidism: May eventually need treatment, as this will serve as a continual precipitating factor for rapid atrial fibrillation.   Kate Sable, M.D., F.A.C.C.

## 2013-06-30 ENCOUNTER — Encounter (HOSPITAL_COMMUNITY): Payer: Self-pay | Admitting: Adult Health

## 2013-06-30 LAB — GLUCOSE, CAPILLARY
GLUCOSE-CAPILLARY: 122 mg/dL — AB (ref 70–99)
Glucose-Capillary: 127 mg/dL — ABNORMAL HIGH (ref 70–99)
Glucose-Capillary: 148 mg/dL — ABNORMAL HIGH (ref 70–99)
Glucose-Capillary: 89 mg/dL (ref 70–99)

## 2013-06-30 MED ORDER — DILTIAZEM HCL 30 MG PO TABS
30.0000 mg | ORAL_TABLET | Freq: Four times a day (QID) | ORAL | Status: DC
  Administered 2013-06-30 – 2013-07-01 (×4): 30 mg via ORAL
  Filled 2013-06-30 (×4): qty 1

## 2013-06-30 MED ORDER — METOPROLOL TARTRATE 50 MG PO TABS
50.0000 mg | ORAL_TABLET | Freq: Two times a day (BID) | ORAL | Status: DC
  Administered 2013-06-30 – 2013-07-02 (×4): 50 mg via ORAL
  Filled 2013-06-30 (×4): qty 1

## 2013-06-30 MED ORDER — METOPROLOL TARTRATE 25 MG PO TABS
25.0000 mg | ORAL_TABLET | Freq: Once | ORAL | Status: AC
  Administered 2013-06-30: 25 mg via ORAL
  Filled 2013-06-30: qty 1

## 2013-06-30 NOTE — Discharge Instructions (Signed)
Information on my medicine - ELIQUIS (apixaban)  This medication education was reviewed with me or my healthcare representative as part of my discharge preparation.  The pharmacist that spoke with me during my hospital stay was:  Lavonia Drafts Adalynn Corne, RPH  Why was Eliquis prescribed for you? Eliquis was prescribed for you to reduce the risk of a blood clot forming that can cause a stroke if you have a medical condition called atrial fibrillation (a type of irregular heartbeat).  What do You need to know about Eliquis ? Take your Eliquis TWICE DAILY - one tablet in the morning and one tablet in the evening with or without food. If you have difficulty swallowing the tablet whole please discuss with your pharmacist how to take the medication safely.  Take Eliquis exactly as prescribed by your doctor and DO NOT stop taking Eliquis without talking to the doctor who prescribed the medication.  Stopping may increase your risk of developing a stroke.  Refill your prescription before you run out.  After discharge, you should have regular check-up appointments with your healthcare provider that is prescribing your Eliquis.  In the future your dose may need to be changed if your kidney function or weight changes by a significant amount or as you get older.  What do you do if you miss a dose? If you miss a dose, take it as soon as you remember on the same day and resume taking twice daily.  Do not take more than one dose of ELIQUIS at the same time to make up a missed dose.  Important Safety Information A possible side effect of Eliquis is bleeding. You should call your healthcare provider right away if you experience any of the following:   Bleeding from an injury or your nose that does not stop.   Unusual colored urine (red or dark brown) or unusual colored stools (red or black).   Unusual bruising for unknown reasons.   A serious fall or if you hit your head (even if there is no  bleeding).  Some medicines may interact with Eliquis and might increase your risk of bleeding or clotting while on Eliquis. To help avoid this, consult your healthcare provider or pharmacist prior to using any new prescription or non-prescription medications, including herbals, vitamins, non-steroidal anti-inflammatory drugs (NSAIDs) and supplements.  This website has more information on Eliquis (apixaban): [email or web address].

## 2013-06-30 NOTE — Care Management Note (Unsigned)
    Page 1 of 1   07/02/2013     5:35:10 PM CARE MANAGEMENT NOTE 07/02/2013  Patient:  RENDI, MAPEL   Account Number:  [id number]  Date Initiated:  06/30/2013  Documentation initiated by:  Vladimir Creeks  Subjective/Objective Assessment:   Admitted with A Fib. Pt is from home with spouse, and will return home at D/C     Action/Plan:   Will follow for needs  Checked with  [place] for coverage for Eliquis, and cost is $200. Pt feels she can handle this, and  knows coumadin is cheaper, but  prefers not to go on coumadin- Eliquis 30 day supply coupon given   Anticipated DC Date:  07/02/2013   Anticipated DC Plan:  [place]  CM consult  Medication Assistance      Choice offered to / List presented to:             Status of service:  Completed, signed off Medicare Important Message given?   (If response is "NO", the following Medicare IM given date fields will be blank) Date Medicare IM given:   Date Additional Medicare IM given:    Discharge Disposition:  HOME/SELF CARE  Per UR Regulation:  Reviewed for med. necessity/level of care/duration of stay  If discussed at [place] of Stay Meetings, dates discussed:    Comments:  07/02/13 [place] RN/CM 06/30/13 1700 Vladimir Creeks RN/CM

## 2013-06-30 NOTE — Progress Notes (Addendum)
Consulting cardiologist: Bronson Ing MD Primary Cardiologist:   Subjective:    Tired but no complaints of pain or dyspnea. HR is still rapid and she can feel it.   Objective:   Temp:  [97.9 F (36.6 C)-98.4 F (36.9 C)] 98.2 F (36.8 C) (04/28 0400) Pulse Rate:  [37-163] 99 (04/28 0715) Resp:  [16-38] 25 (04/28 0200) BP: (79-145)/(44-130) 125/78 mmHg (04/28 0715) SpO2:  [98 %-100 %] 100 % (04/28 0715) Weight:  [293 lb 14 oz (133.3 kg)] 293 lb 14 oz (133.3 kg) (04/28 0500) Last BM Date: 06/29/13  Filed Weights   06/28/13 0500 06/29/13 0500 06/30/13 0500  Weight: 287 lb 4.2 oz (130.3 kg) 289 lb 0.4 oz (131.1 kg) 293 lb 14 oz (133.3 kg)    Intake/Output Summary (Last 24 hours) at 06/30/13 0816 Last data filed at 06/30/13 0600  Gross per 24 hour  Intake 1121.2 ml  Output      5 ml  Net 1116.2 ml    Telemetry: Atrial fibrillation with rates between 120-140 bpm.  Exam:  General: No acute distress.  HEENT: Conjunctiva and lids normal, oropharynx clear.  Lungs: Clear to auscultation, nonlabored.  Cardiac: No elevated JVP or bruits. IRRR-fast, no gallop or rub.   Abdomen: Normoactive bowel sounds, nontender, nondistended.  Extremities: No pitting edema, distal pulses full.  Neuropsychiatric: Alert and oriented x3, affect appropriate.   Lab Results:  Basic Metabolic Panel:  Recent Labs Lab 06/26/13 1144 06/27/13 0532 06/28/13 0458 06/29/13 0437  NA 141 141 143 143  K 4.5 4.3 4.5 4.3  CL 106 110 112 111  CO2 17* 17* 18* 16*  GLUCOSE 153* 137* 129* 124*  BUN 45* 41* 28* 22  CREATININE 1.06 1.12* 0.82 0.81  CALCIUM 10.9* 9.1 9.5 9.4  MG 1.6  --   --   --     CBC:  Recent Labs Lab 06/26/13 1144 06/27/13 0532 06/28/13 0458  WBC 8.9 4.7 5.9  HGB 11.0* 9.3* 9.7*  HCT 32.7* 27.5* 29.1*  MCV 89.1 89.6 89.0  PLT 196 144* 160    Cardiac Enzymes:  Recent Labs Lab 06/26/13 2330 06/27/13 0532 06/27/13 1133  TROPONINI <0.30 <0.30 <0.30    Echocardiogram: 06/27/2013 Left ventricle: The cavity size was normal. Systolic function was normal. The estimated ejection fraction was in the range of 55% to 60%. Wall motion was normal; there were no regional wall motion abnormalities. - Aortic valve: Valve area: 2.18cm^2 (Vmax). - Mitral valve: Mild regurgitation. Left atrium: The atrium was normal in size.   Medications:   Scheduled Medications: . apixaban  5 mg Oral BID  . digoxin  0.25 mg Oral Daily  . insulin aspart  0-15 Units Subcutaneous TID WC  . insulin aspart  0-5 Units Subcutaneous QHS  . methimazole  5 mg Oral BID  . metoprolol tartrate  25 mg Oral BID  . senna  1 tablet Oral BID  . simvastatin  5 mg Oral q1800    Infusions: . sodium chloride 75 mL/hr at 06/26/13 1151  . sodium chloride 50 mL/hr at 06/29/13 1554  . amiodarone (NEXTERONE PREMIX) 360 mg/200 mL dextrose 30 mg/hr (06/30/13 0600)    PRN Medications: acetaminophen, acetaminophen, alum & mag hydroxide-simeth, bisacodyl, HYDROcodone-acetaminophen, morphine injection, ondansetron (ZOFRAN) IV, ondansetron   Assessment and Plan:   1.Atrial fibrillation with RVR: Heart rate is not controlled. Continues on amiodarone at 30 mg/hr, digoxin was increased to 0.25 mg daily, metoprolol 25 mg BID. Echocardiogram demonstrates normal EF and  normal LA size. Consideration for DCCV is being discussed. Flecainide may also be a possibility. Tolerating apixaban without bleeding complications.   As OP, consideration for OSA evaluation  due to body habitus may be considered as this also contributes to atrial fib recurrence.  2. Hyperthyroidism: Now on tapazole po. Contributing to atrial fib.   3. Hypercholesterolemia: On statin  4. Diabetes: Followed by PTH   Phill Myron. Purcell Nails NP [facility] 06/30/2013, 8:16 AM  1. Atrial fibrillation with rapid ventricular response: Remains asymptomatic. SBP's low normal to normal. Normal LV systolic function.  Hyperthyroidism likely contributing, and has been started on methimazole. HR still elevated. Will increase metoprolol to 50 mg bid. Will d/c amiodarone infusion. As BP tolerates, will also give short-acting diltiazem 30 mg q 6 hrs. Tolerating Eliquis for anticoagulation. Continue digoxin to 0.25 mg daily. If additional medication titration fails to adequately control HR, may need TEE/DCCV, possibly 4/29. Will keep npo at midnight in the event this is required.  2. Hyperthyroidism: Now on methimazole. This is no doubt contributing to her rapid atrial fibrillation.  Kate Sable, M.D., F.A.C.C.

## 2013-06-30 NOTE — Progress Notes (Signed)
[facility] PROGRESS NOTE  Kaitlyn Hull NFA:[id number] DOB: 04-Aug-1955 DOA: 06/26/2013 PCP: Deloria Lair, MD  Assessment/Plan: New Onset A Fib with RVR -Rate still elevated at 120's-130's. -Appreciate cards recommendations: increase metoprolol to 50 mg BID, start cardizem PO, DC amio drip. -If meds fail to control rate, may require TEE/DCCV later this week. -ECHO with mild MR, otherwise unremarkable -Troponins serially negative. -TSH/free T4 c/w hyperthyroidism: see below for details. -On eliquis for stroke prevention.  Hyperthyroidism -Low TSH with high free T4. -Will start methimazole 5 mg BID. -Will need eventual OP endocrine follow up.  DM II -Well controlled. -Continue current management.  Breast Cancer -Continue OP follow up with oncology as scheduled.  Code Status: Full Code Family Communication: Patient only  Disposition Plan: Home when ready. Keep in SDU today.   Consultants:  Cardiology  Antibiotics:  None   Subjective: No complaints. No acute events overnight  Objective: Filed Vitals:   06/30/13 1100 06/30/13 1200 06/30/13 1300 06/30/13 1400  BP: 103/75 112/81 121/92 122/72  Pulse: 101  101 57  Temp:  97.7 F (36.5 C)    TempSrc:  Oral    Resp: 32 23 22 28   Height:      Weight:      SpO2: 100%  100% 100%    Intake/Output Summary (Last 24 hours) at 06/30/13 1448 Last data filed at 06/30/13 0600  Gross per 24 hour  Intake  821.2 ml  Output      4 ml  Net  817.2 ml   Filed Weights   06/28/13 0500 06/29/13 0500 06/30/13 0500  Weight: 130.3 kg (287 lb 4.2 oz) 131.1 kg (289 lb 0.4 oz) 133.3 kg (293 lb 14 oz)    Exam:   General:  AA Ox3  Cardiovascular: tachy, irregular  Respiratory: CTA B  Abdomen: S/NT/ND/+BS  Extremities: trace bilateral edema.   Neurologic:  Intact and non-focal.  Data Reviewed: Basic Metabolic Panel:  Recent Labs Lab 06/26/13 1144 06/27/13 0532 06/28/13 0458 06/29/13 0437  NA 141 141  143 143  K 4.5 4.3 4.5 4.3  CL 106 110 112 111  CO2 17* 17* 18* 16*  GLUCOSE 153* 137* 129* 124*  BUN 45* 41* 28* 22  CREATININE 1.06 1.12* 0.82 0.81  CALCIUM 10.9* 9.1 9.5 9.4  MG 1.6  --   --   --    Liver Function Tests: No results found for this basename: AST, ALT, ALKPHOS, BILITOT, PROT, ALBUMIN,  in the last 168 hours No results found for this basename: LIPASE, AMYLASE,  in the last 168 hours No results found for this basename: AMMONIA,  in the last 168 hours CBC:  Recent Labs Lab 06/26/13 1144 06/27/13 0532 06/28/13 0458  WBC 8.9 4.7 5.9  NEUTROABS 6.0  --   --   HGB 11.0* 9.3* 9.7*  HCT 32.7* 27.5* 29.1*  MCV 89.1 89.6 89.0  PLT 196 144* 160   Cardiac Enzymes:  Recent Labs Lab 06/26/13 1144 06/26/13 1755 06/26/13 2330 06/27/13 0532 06/27/13 1133  TROPONINI <0.30 <0.30 <0.30 <0.30 <0.30   BNP (last 3 results) No results found for this basename: PROBNP,  in the last 8760 hours CBG:  Recent Labs Lab 06/29/13 1131 06/29/13 1707 06/29/13 2148 06/30/13 0754 06/30/13 1143  GLUCAP 156* 126* 130* 122* 127*    Recent Results (from the past 240 hour(s))  MRSA PCR SCREENING     Status: None   Collection Time    06/26/13  3:00 PM  Result Value Ref Range Status   MRSA by PCR NEGATIVE  NEGATIVE Final   Comment:            The GeneXpert MRSA Assay (FDA     approved for NASAL specimens     only), is one component of a     comprehensive MRSA colonization     surveillance program. It is not     intended to diagnose MRSA     infection nor to guide or     monitor treatment for     MRSA infections.     Studies: No results found.  Scheduled Meds: . apixaban  5 mg Oral BID  . digoxin  0.25 mg Oral Daily  . diltiazem  30 mg Oral 4 times per day  . insulin aspart  0-15 Units Subcutaneous TID WC  . insulin aspart  0-5 Units Subcutaneous QHS  . methimazole  5 mg Oral BID  . metoprolol tartrate  50 mg Oral BID  . senna  1 tablet Oral BID  .  simvastatin  5 mg Oral q1800   Continuous Infusions: . sodium chloride 75 mL/hr at 06/26/13 1151  . sodium chloride 50 mL/hr at 06/29/13 1554    Principal Problem:   Atrial fibrillation with rapid ventricular response Active Problems:   History of breast cancer   Diabetes   Hypertension   Obesity   New onset a-fib   Hyperthyroidism    Time spent: 25 minutes. Greater than 50% of this time was spent in direct contact with the patient coordinating care.    [facility] Pager [phone]  If 7PM-7AM, please contact night-coverage at [email or web address], password [facility] 06/30/2013, 2:48 PM  LOS: 4 days

## 2013-07-01 LAB — GLUCOSE, CAPILLARY
GLUCOSE-CAPILLARY: 100 mg/dL — AB (ref 70–99)
GLUCOSE-CAPILLARY: 121 mg/dL — AB (ref 70–99)
Glucose-Capillary: 101 mg/dL — ABNORMAL HIGH (ref 70–99)
Glucose-Capillary: 111 mg/dL — ABNORMAL HIGH (ref 70–99)

## 2013-07-01 MED ORDER — DILTIAZEM HCL 60 MG PO TABS
60.0000 mg | ORAL_TABLET | Freq: Three times a day (TID) | ORAL | Status: DC
  Administered 2013-07-01 (×2): 60 mg via ORAL
  Filled 2013-07-01 (×3): qty 1

## 2013-07-01 NOTE — Plan of Care (Signed)
Problem: Food- and Nutrition-Related Knowledge Deficit (NB-1.1) Goal: Nutrition education Formal process to instruct or train a patient/client in a skill or to impart knowledge to help patients/clients voluntarily manage or modify food choices and eating behavior to maintain or improve health. Outcome: Adequate for Discharge  RD consulted for nutrition education regarding diabetes.     Lab Results  Component Value Date    HGBA1C 6.3* 06/26/2013   Pt reports she has type 2/ "borderline" diabetes. Hx of diabetes postitive in family with older sister; pt is unsure if parents or grandparents had DM. Hgb A1c ranges from 6.0-6.3. She reports she started making diet changes a few months ago after having a medical scare with her vision. She has lost approximately 30# intentionally over the past 6 months via eating grilled meats, increasing vegetables, and eating whole grains. She reports paying more attention to what she eats and is reading food labels in the store. She has stopped eating sugary snacks. She admits to being physically inactive and is not contemplating exercise any time soon. She is interested in learning more about meal planning.   RD provided "Carbohydrate Counting for People with Diabetes" and "Label Reading Tips for Diabetes" handout from the Academy of [employer] Dietetics. Discussed different food groups and their effects on blood sugar, emphasizing carbohydrate-containing foods. Provided list of carbohydrates and recommended serving sizes of common foods. Also discussed reading food labels and interpretation of data to assess nutritional quality of foods.   Discussed importance of controlled and consistent carbohydrate intake throughout the day. Provided examples of ways to balance meals/snacks and encouraged intake of high-fiber, whole grain complex carbohydrates. Teach back method used.  Expect good compliance.  Body mass index is 53.37 kg/(m^2). Pt meets criteria for extreme  obesity, class III based on current BMI.  Current diet order is carb modified, patient is consuming approximately 50-80% of meals at this time. Labs and medications reviewed. No further nutrition interventions warranted at this time. RD contact information provided. If additional nutrition issues arise, please re-consult RD.  Kaitlyn Hull A. Jimmye Norman, RD, LDN Pager: [phone]

## 2013-07-01 NOTE — Progress Notes (Signed)
SUBJECTIVE: Pt in good spirits this morning, and denies any complaints.     Intake/Output Summary (Last 24 hours) at 07/01/13 0910 Last data filed at 07/01/13 0600  Gross per 24 hour  Intake   3240 ml  Output      0 ml  Net   3240 ml    Current Facility-Administered Medications  Medication Dose Route Frequency Provider Last Rate Last Dose  . 0.9 %  sodium chloride infusion   Intravenous Continuous Alfonzo Feller, DO 75 mL/hr at 06/26/13 1151    . 0.9 %  sodium chloride infusion   Intravenous Continuous Radene Gunning, NP 50 mL/hr at 07/01/13 0600    . acetaminophen (TYLENOL) tablet 650 mg  650 mg Oral Q6H PRN Radene Gunning, NP   650 mg at 06/28/13 0502   Or  . acetaminophen (TYLENOL) suppository 650 mg  650 mg Rectal Q6H PRN Radene Gunning, NP      . alum & mag hydroxide-simeth (MAALOX/MYLANTA) 200-200-20 MG/5ML suspension 30 mL  30 mL Oral Q6H PRN Radene Gunning, NP      . apixaban (ELIQUIS) tablet 5 mg  5 mg Oral BID Arnoldo Lenis, MD   5 mg at 07/01/13 0900  . bisacodyl (DULCOLAX) suppository 10 mg  10 mg Rectal Daily PRN Radene Gunning, NP      . digoxin (LANOXIN) tablet 0.25 mg  0.25 mg Oral Daily Herminio Commons, MD   0.25 mg at 07/01/13 0900  . diltiazem (CARDIZEM) tablet 30 mg  30 mg Oral 4 times per day Herminio Commons, MD   30 mg at 07/01/13 0544  . HYDROcodone-acetaminophen (NORCO/VICODIN) 5-325 MG per tablet 1-2 tablet  1-2 tablet Oral Q4H PRN Radene Gunning, NP      . insulin aspart (novoLOG) injection 0-15 Units  0-15 Units Subcutaneous TID WC Radene Gunning, NP   2 Units at 06/30/13 1727  . insulin aspart (novoLOG) injection 0-5 Units  0-5 Units Subcutaneous QHS Lezlie Octave Black, NP      . methimazole (TAPAZOLE) tablet 5 mg  5 mg Oral BID Erline Hau, MD   5 mg at 06/30/13 2148  . metoprolol (LOPRESSOR) tablet 50 mg  50 mg Oral BID Herminio Commons, MD   50 mg at 07/01/13 0900  . morphine 2 MG/ML injection 1 mg  1 mg Intravenous Q2H PRN  Radene Gunning, NP      . ondansetron [facility]) tablet 4 mg  4 mg Oral Q6H PRN Radene Gunning, NP       Or  . ondansetron [facility]) injection 4 mg  4 mg Intravenous Q6H PRN Radene Gunning, NP      . senna (SENOKOT) tablet 8.6 mg  1 tablet Oral BID Lezlie Octave Black, NP   8.6 mg at 07/01/13 0900  . simvastatin (ZOCOR) tablet 5 mg  5 mg Oral q1800 Lezlie Octave Black, NP   5 mg at 06/30/13 1740    Filed Vitals:   07/01/13 0600 07/01/13 0700 07/01/13 0730 07/01/13 0800  BP: 93/64 109/59  97/56  Pulse: 93 81  80  Temp:   98.2 F (36.8 C)   TempSrc:   Oral   Resp: 21 23  22   Height:      Weight:      SpO2: 96% 98%  99%    PHYSICAL EXAM General: NAD Neck: No JVD, no thyromegaly.  Lungs:  Clear to auscultation bilaterally with normal respiratory effort. CV: Nondisplaced PMI.  Regular rate and rhythm, normal S1/S2, no S3/S4, no murmur.  No pretibial edema.  No carotid bruit.  Normal pedal pulses.  Abdomen: Soft, nontender, no hepatosplenomegaly, no distention.  Neurologic: Alert and oriented x 3.  Psych: Normal affect. Extremities: No clubbing or cyanosis.   TELEMETRY: Reviewed telemetry pt in sinus rhythm with PAC's.  LABS: Basic Metabolic Panel:  Recent Labs  06/29/13 0437  NA 143  K 4.3  CL 111  CO2 16*  GLUCOSE 124*  BUN 22  CREATININE 0.81  CALCIUM 9.4   Liver Function Tests: No results found for this basename: AST, ALT, ALKPHOS, BILITOT, PROT, ALBUMIN,  in the last 72 hours No results found for this basename: LIPASE, AMYLASE,  in the last 72 hours CBC: No results found for this basename: WBC, NEUTROABS, HGB, HCT, MCV, PLT,  in the last 72 hours Cardiac Enzymes: No results found for this basename: CKTOTAL, CKMB, CKMBINDEX, TROPONINI,  in the last 72 hours BNP: No components found with this basename: POCBNP,  D-Dimer: No results found for this basename: DDIMER,  in the last 72 hours Hemoglobin A1C: No results found for this basename: HGBA1C,  in the last 72 hours Fasting  Lipid Panel: No results found for this basename: CHOL, HDL, LDLCALC, TRIG, CHOLHDL, LDLDIRECT,  in the last 72 hours Thyroid Function Tests: No results found for this basename: TSH, T4TOTAL, FREET3, T3FREE, THYROIDAB,  in the last 72 hours Anemia Panel: No results found for this basename: VITAMINB12, FOLATE, FERRITIN, TIBC, IRON, RETICCTPCT,  in the last 72 hours  RADIOLOGY: Dg Chest Port 1 View  06/26/2013   CLINICAL DATA:  Chest pain  EXAM: PORTABLE CHEST - 1 VIEW  COMPARISON:  02/15/06  FINDINGS: The heart size and mediastinal contours are within normal limits. Both lungs are clear. The visualized skeletal structures are unremarkable.  IMPRESSION: No active disease.   Electronically Signed   By: Inez Catalina M.D.   On: 06/26/2013 11:52      ASSESSMENT AND PLAN: 1. Atrial fibrillation: Will continue current doses of digoxin and metoprolol. I will change dose and frequency of short-acting diltiazem to 60 mg tid, with the hopes of suppressing ectopy which could potentiate atrial fibrillation. Continue Eliquis for anticoagulation. 2. Hyperthyroidism: Currently being treated with methimazole.  Dispo: If pt remains in sinus rhythm and/or reverts to controlled atrial fibrillation, could potentially be discharged.   Kate Sable, M.D., F.A.C.C.

## 2013-07-01 NOTE — Progress Notes (Signed)
PROGRESS NOTE  Kaitlyn Hull GYI:[id number] DOB: March 21, 1955 DOA: 06/26/2013 PCP: Deloria Lair, MD  Summary: 58 year old woman with history of diabetes and hypertension presented with 2 day history of chest pain, found to have atrial fibrillation with rapid ventricular response (new diagnosis), treated with Cardizem infusion and then digoxin load when rate control could not be achieved. Cardizem discontinued secondary to hypotension and amiodarone infusion required. Subsequently diagnosed with hyperthyroidism.  Assessment/Plan: 1. Atrial fibrillation with RVR. Now in sinus rhythm/sinus bradycardia. Maintained on short acting diltiazem, digoxin, metoprolol. CHADS2Vasc score is 3 (HTN, DM, female), started on Eliquis. 2. New diagnosis hyperthyroidism. Low TSH with high T4. Started on methimazole 5 mg by mouth twice a day.  3. Anion gap metabolic acidosis. Likely related to acute illness, respiratory. 4. Possible acute renal failure, appears resolved. BUN has normalized. Creatinine likely at baseline. 5. Normocytic anemia. Stable. No evidence of bleeding. Likely secondary to hyperthyroidism. Outpatient followup recommended.  6. DM type 2. Well controlled. Hold metformin. Hemoglobin A1c 6.3. 7. HTN 8. H/o breast cancer s/p lumpectomy 2007 9. H/o tobacco dependence in remission 10. Consider outpatient assessment for sleep apnea due to body habitus.   Now in sinus rhythm/sinus bradycardia. Discussed with cardiology, continue metoprolol, digoxin with adjustment to diltiazem. Continue Eliquis.  Ambulate. Can likely be discharged in the next 24 hours.  Check anemia panel.  BMP in AM.  Nutrition consult.  F/u with Dr. Dorris Fetch as an outpatient.  Reviewed the above with patient and family members at bedside.  Code Status: full DVT prophylaxis: Eliquis Family Communication:  Disposition Plan:   Murray Hodgkins, MD  [facility]  Pager [phone] If 7PM-7AM, please contact  night-coverage at [email or web address], password [facility] 07/01/2013, 8:58 AM  LOS: 5 days   Consultants:  Cardiology   Procedures:  2-D echocardiogram. LVEF 55-60%. Normal wall motion.  Antibiotics:    HPI/Subjective: No issues overnight. She feels well, no complaints. No vomiting. No abdominal pain. No chest pain or shortness of breath.  Objective: Filed Vitals:   07/01/13 0600 07/01/13 0700 07/01/13 0730 07/01/13 0800  BP: 93/64 109/59  97/56  Pulse: 93 81  80  Temp:   98.2 F (36.8 C)   TempSrc:   Oral   Resp: 21 23  22   Height:      Weight:      SpO2: 96% 98%  99%    Intake/Output Summary (Last 24 hours) at 07/01/13 0858 Last data filed at 07/01/13 0600  Gross per 24 hour  Intake   3240 ml  Output      0 ml  Net   3240 ml     Filed Weights   06/29/13 0500 06/30/13 0500 07/01/13 0500  Weight: 131.1 kg (289 lb 0.4 oz) 133.3 kg (293 lb 14 oz) 132.4 kg (291 lb 14.2 oz)    Exam:   Afebrile. Vitals are stable with mild borderline hypotension intermittently.  Gen. Appears calm and comfortable. Speech fluent and clear.  Cardiovascular regular rhythm, bradycardic. No murmur, rub or gallop. No lower extremity edema.  Respiratory clear to auscultation bilaterally. No wheezes, rales or rhonchi. Normal respiratory effort.   Psychiatric. Grossly normal mood and affect. Speech fluent and appropriate.  Data Reviewed:  Capillary blood sugars stable.  Scheduled Meds: . apixaban  5 mg Oral BID  . digoxin  0.25 mg Oral Daily  . diltiazem  30 mg Oral 4 times per day  . insulin aspart  0-15 Units Subcutaneous TID WC  . insulin aspart  0-5 Units Subcutaneous QHS  . methimazole  5 mg Oral BID  . metoprolol tartrate  50 mg Oral BID  . senna  1 tablet Oral BID  . simvastatin  5 mg Oral q1800   Continuous Infusions: . sodium chloride 75 mL/hr at 06/26/13 1151  . sodium chloride 50 mL/hr at 07/01/13 0600    Principal Problem:   Atrial fibrillation with rapid ventricular  response Active Problems:   History of breast cancer   Diabetes   Hypertension   Obesity   New onset a-fib   Hyperthyroidism   Time spent 25  minutes

## 2013-07-02 LAB — GLUCOSE, CAPILLARY
GLUCOSE-CAPILLARY: 101 mg/dL — AB (ref 70–99)
Glucose-Capillary: 121 mg/dL — ABNORMAL HIGH (ref 70–99)

## 2013-07-02 LAB — RETICULOCYTES
RBC.: 3.23 MIL/uL — ABNORMAL LOW (ref 3.87–5.11)
Retic Count, Absolute: 48.5 10*3/uL (ref 19.0–186.0)
Retic Ct Pct: 1.5 % (ref 0.4–3.1)

## 2013-07-02 LAB — BASIC METABOLIC PANEL
BUN: 17 mg/dL (ref 6–23)
CHLORIDE: 110 meq/L (ref 96–112)
CO2: 19 mEq/L (ref 19–32)
Calcium: 9.3 mg/dL (ref 8.4–10.5)
Creatinine, Ser: 0.85 mg/dL (ref 0.50–1.10)
GFR calc non Af Amer: 75 mL/min — ABNORMAL LOW (ref >90)
GFR, EST AFRICAN AMERICAN: 87 mL/min — AB (ref >90)
Glucose, Bld: 112 mg/dL — ABNORMAL HIGH (ref 70–99)
POTASSIUM: 3.9 meq/L (ref 3.7–5.3)
Sodium: 142 mEq/L (ref 137–147)

## 2013-07-02 LAB — IRON AND TIBC
IRON: 58 ug/dL (ref 42–135)
Saturation Ratios: 27 % (ref 20–55)
TIBC: 211 ug/dL — AB (ref 250–470)
UIBC: 153 ug/dL (ref 125–400)

## 2013-07-02 LAB — VITAMIN B12: VITAMIN B 12: 602 pg/mL (ref 211–911)

## 2013-07-02 LAB — FOLATE: FOLATE: 15.8 ng/mL

## 2013-07-02 LAB — FERRITIN: Ferritin: 272 ng/mL (ref 10–291)

## 2013-07-02 MED ORDER — DIGOXIN 250 MCG PO TABS: 0.2500 mg | ORAL_TABLET | Freq: Every day | ORAL | Status: DC

## 2013-07-02 MED ORDER — APIXABAN 5 MG PO TABS: 5.0000 mg | ORAL_TABLET | Freq: Two times a day (BID) | ORAL | Status: DC

## 2013-07-02 MED ORDER — METHIMAZOLE 5 MG PO TABS: 5.0000 mg | ORAL_TABLET | Freq: Two times a day (BID) | ORAL | Status: DC

## 2013-07-02 MED ORDER — DILTIAZEM HCL 60 MG PO TABS: 60.0000 mg | ORAL_TABLET | Freq: Three times a day (TID) | ORAL | Status: DC

## 2013-07-02 MED ORDER — METOPROLOL TARTRATE 50 MG PO TABS: 50.0000 mg | ORAL_TABLET | Freq: Two times a day (BID) | ORAL | Status: DC

## 2013-07-02 NOTE — Discharge Summary (Signed)
Physician Discharge Summary  Kaitlyn Hull:[id number] DOB: 03-06-1955 DOA: 06/26/2013  PCP: Deloria Lair, MD Cardiologist: Dr. Bronson Ing Endocrinologist: Will establish with Dr. Dorris Fetch  Admit date: 06/26/2013 Discharge date: 07/02/2013  Recommendations for Outpatient Follow-up:  1. New diagnosis of atrial fibrillation with rapid ventricular response, complicated by new diagnosis of hyperthyroidism. Started on Eliquis, metoprolol, digoxin, diltiazem. May need to adjust medications as the patient becomes euthyroid over the next several weeks. 2. New diagnosis of hyperthyroidism. Started on methimazole. 3. Normocytic anemia, suspect related to hyperthyroidism. Consider outpatient evaluation. 4. Outpatient assessment for sleep apnea could be considered given body habitus. 5. Recommend weight loss.  Follow-up Information   Follow up with NIDA,GEBRESELASSIE, MD On 07/13/2013. (1:30 pm)    Specialty:  Endocrinology   Contact information:   [place] [address] [phone]       Follow up with TAPPER,DAVID B, MD In 1 week.   Specialty:  Family Medicine   Contact information:   [address] [place] [address] [phone]       Follow up with Jory Sims, NP On 07/16/2013. (1:30 PM)    Specialty:  Nurse Practitioner   Contact information:   [address] [place] [address] [phone]      Discharge Diagnoses:  1. Atrial fibrillation with rapid ventricular response, new diagnosis 2. Hyperthyroidism, new diagnosis 3. Anion gap metabolic acidosis 4. Possible acute renal failure 5. Normocytic anemia 6. Diabetes mellitus type 2 7. Extreme obesity class III, BMI 53.37 8. History of tobacco dependence in remission  Discharge Condition: Improved Disposition: Home  Diet recommendation: Diabetic diet  Filed Weights   06/30/13 0500 07/01/13 0500 07/02/13 0500  Weight: 133.3 kg (293 lb 14 oz) 132.4 kg (291 lb 14.2 oz) 131.4 kg (289 lb 11  oz)    History of present illness:  58 year old woman with history of diabetes and hypertension presented with 2 day history of chest pain, found to have atrial fibrillation with rapid ventricular response (new diagnosis) and admitted.  Hospital Course:  Patient was treated with Cardizem infusion and then digoxin load when rate control could not be achieved. She was seen by cardiology. Eventually required amiodarone infusion. Subsequently converted to sinus rhythm, currently maintained on Cardizem, digoxin, metoprolol per cardiology, started on Eliquis for stroke prophylaxis. Subsequently diagnosed with hyperthyroidism and started on methimazole. Remains in sinus rhythm, clinically stable for discharge. Individual issues as below. Plan:  1. Atrial fibrillation with RVR. Remains in sinus rhythm/. Continue diltiazem, digoxin, metoprolol. CHADS2Vasc score is 3 (HTN, DM, female), started on Eliquis. 2. New diagnosis hyperthyroidism. Low TSH with high T4. Started on methimazole 5 mg by mouth twice a day.  3. Anion gap metabolic acidosis. Resolved. Likely related to acute illness, respiratory. 4. Possible acute renal failure, resolved.  5. Normocytic anemia. Stable. No evidence of bleeding. Likely secondary to hyperthyroidism. Outpatient followup recommended.  6. DM type 2. Well controlled. Resume metformin. Hemoglobin A1c 6.3. 7. H/o tobacco dependence in remission 8. Consider outpatient assessment for sleep apnea due to body habitus. 9. Extreme obesity class III, BMI 53.37                                            Continue metoprolol, digoxin, diltiazem.   Continue Eliquis. I discussed risk/benefit with patient. She is aware there is no reversal agent.  F/u with Dr. Dorris Fetch and cardiology as an outpatient has been arranged.   Consultants:  Cardiology  Procedures:  2-D echocardiogram. LVEF 55-60%. Normal wall motion.  Discharge Instructions  Discharge Orders   Future Appointments  Provider Department Dept Phone   07/16/2013 1:30 PM Lendon Colonel, NP [facility] [place] [facility]na Hoff [phone]   Future Orders Complete By Expires   Activity as tolerated - No restrictions  As directed    Diet Carb Modified  As directed    Discharge instructions  As directed        Medication List    STOP taking these medications       aspirin EC 81 MG tablet     ibuprofen 200 MG tablet  Commonly known as:  ADVIL,MOTRIN     lisinopril-hydrochlorothiazide 20-12.5 MG per tablet  Commonly known as:  PRINZIDE,ZESTORETIC      TAKE these medications       apixaban 5 MG Tabs tablet  Commonly known as:  ELIQUIS  Take 1 tablet (5 mg total) by mouth 2 (two) times daily.     digoxin 0.25 MG tablet  Commonly known as:  LANOXIN  Take 1 tablet (0.25 mg total) by mouth daily.     diltiazem 60 MG tablet  Commonly known as:  CARDIZEM  Take 1 tablet (60 mg total) by mouth every 8 (eight) hours.     megestrol 40 MG tablet  Commonly known as:  MEGACE  Take 40 mg by mouth daily.     metFORMIN 500 MG 24 hr tablet  Commonly known as:  GLUCOPHAGE-XR  Take 500 mg by mouth 2 (two) times daily.     methimazole 5 MG tablet  Commonly known as:  TAPAZOLE  Take 1 tablet (5 mg total) by mouth 2 (two) times daily.     metoprolol 50 MG tablet  Commonly known as:  LOPRESSOR  Take 1 tablet (50 mg total) by mouth 2 (two) times daily.     multivitamin tablet  Take 1 tablet by mouth daily.     pravastatin 40 MG tablet  Commonly known as:  PRAVACHOL  Take 40 mg by mouth at bedtime.       No Known Allergies  The results of significant diagnostics from this hospitalization (including imaging, microbiology, ancillary and laboratory) are listed below for reference.    Significant Diagnostic Studies: Dg Chest Port 1 View  06/26/2013   CLINICAL DATA:  Chest pain  EXAM: PORTABLE CHEST - 1 VIEW  COMPARISON:  02/15/06  FINDINGS: The heart size and mediastinal contours are within normal limits.  Both lungs are clear. The visualized skeletal structures are unremarkable.  IMPRESSION: No active disease.   Electronically Signed   By: Inez Catalina M.D.   On: 06/26/2013 11:52    Microbiology: Recent Results (from the past 240 hour(s))  MRSA PCR SCREENING     Status: None   Collection Time    06/26/13  3:00 PM      Result Value Ref Range Status   MRSA by PCR NEGATIVE  NEGATIVE Final   Comment:            The GeneXpert MRSA Assay (FDA     approved for NASAL specimens     only), is one component of a     comprehensive MRSA colonization     surveillance program. It is not     intended to diagnose MRSA     infection nor to guide or  monitor treatment for     MRSA infections.     Labs: Basic Metabolic Panel:  Recent Labs Lab 06/26/13 1144 06/27/13 0532 06/28/13 0458 06/29/13 0437 07/02/13 0525  NA 141 141 143 143 142  K 4.5 4.3 4.5 4.3 3.9  CL 106 110 112 111 110  CO2 17* 17* 18* 16* 19  GLUCOSE 153* 137* 129* 124* 112*  BUN 45* 41* 28* 22 17  CREATININE 1.06 1.12* 0.82 0.81 0.85  CALCIUM 10.9* 9.1 9.5 9.4 9.3  MG 1.6  --   --   --   --    CBC:  Recent Labs Lab 06/26/13 1144 06/27/13 0532 06/28/13 0458  WBC 8.9 4.7 5.9  NEUTROABS 6.0  --   --   HGB 11.0* 9.3* 9.7*  HCT 32.7* 27.5* 29.1*  MCV 89.1 89.6 89.0  PLT 196 144* 160   Cardiac Enzymes:  Recent Labs Lab 06/26/13 1144 06/26/13 1755 06/26/13 2330 06/27/13 0532 06/27/13 1133  TROPONINI <0.30 <0.30 <0.30 <0.30 <0.30   CBG:  Recent Labs Lab 07/01/13 0730 07/01/13 1133 07/01/13 1628 07/01/13 2201 07/02/13 0746  GLUCAP 101* 111* 100* 121* 101*    Principal Problem:   Atrial fibrillation with rapid ventricular response Active Problems:   History of breast cancer   Diabetes   Hypertension   Obesity   New onset a-fib   Hyperthyroidism   Time coordinating discharge: 35 minutes  Signed:  Murray Hodgkins, MD [facility] 07/02/2013, 11:08 AM

## 2013-07-02 NOTE — Progress Notes (Signed)
SUBJECTIVE: Pt is asymptomatic. Denies palpitations, lightheadedness, and dizziness. Thinks BP readings may be inaccurate.     Intake/Output Summary (Last 24 hours) at 07/02/13 0854 Last data filed at 07/02/13 0400  Gross per 24 hour  Intake    150 ml  Output      0 ml  Net    150 ml    Current Facility-Administered Medications  Medication Dose Route Frequency Provider Last Rate Last Dose  . acetaminophen (TYLENOL) tablet 650 mg  650 mg Oral Q6H PRN Radene Gunning, NP   650 mg at 06/28/13 0502   Or  . acetaminophen (TYLENOL) suppository 650 mg  650 mg Rectal Q6H PRN Radene Gunning, NP      . alum & mag hydroxide-simeth (MAALOX/MYLANTA) 200-200-20 MG/5ML suspension 30 mL  30 mL Oral Q6H PRN Radene Gunning, NP      . apixaban (ELIQUIS) tablet 5 mg  5 mg Oral BID Arnoldo Lenis, MD   5 mg at 07/02/13 0900  . bisacodyl (DULCOLAX) suppository 10 mg  10 mg Rectal Daily PRN Radene Gunning, NP      . digoxin (LANOXIN) tablet 0.25 mg  0.25 mg Oral Daily Herminio Commons, MD   0.25 mg at 07/02/13 0900  . diltiazem (CARDIZEM) tablet 60 mg  60 mg Oral 3 times per day Herminio Commons, MD   60 mg at 07/01/13 2126  . HYDROcodone-acetaminophen (NORCO/VICODIN) 5-325 MG per tablet 1-2 tablet  1-2 tablet Oral Q4H PRN Radene Gunning, NP      . insulin aspart (novoLOG) injection 0-15 Units  0-15 Units Subcutaneous TID WC Radene Gunning, NP   2 Units at 06/30/13 1727  . insulin aspart (novoLOG) injection 0-5 Units  0-5 Units Subcutaneous QHS Lezlie Octave Black, NP      . methimazole (TAPAZOLE) tablet 5 mg  5 mg Oral BID Erline Hau, MD   5 mg at 07/02/13 0900  . metoprolol (LOPRESSOR) tablet 50 mg  50 mg Oral BID Herminio Commons, MD   50 mg at 07/02/13 0900  . ondansetron (ZOFRAN) tablet 4 mg  4 mg Oral Q6H PRN Radene Gunning, NP       Or  . ondansetron [facility]) injection 4 mg  4 mg Intravenous Q6H PRN Radene Gunning, NP      . senna (SENOKOT) tablet 8.6 mg  1 tablet Oral BID  Lezlie Octave Black, NP   8.6 mg at 07/02/13 0900  . simvastatin (ZOCOR) tablet 5 mg  5 mg Oral q1800 Radene Gunning, NP   5 mg at 07/01/13 1721    Filed Vitals:   07/02/13 0500 07/02/13 0600 07/02/13 0700 07/02/13 0800  BP: 95/56 97/45 108/49 119/61  Pulse: 82 80 86 98  Temp:      TempSrc:      Resp: 23     Height:      Weight: 289 lb 11 oz (131.4 kg)     SpO2: 97% 98% 99% 99%    PHYSICAL EXAM General: NAD Neck: No JVD, no thyromegaly.  Lungs: Clear to auscultation bilaterally with normal respiratory effort. CV: Nondisplaced PMI.  Regular rate and rhythm, normal S1/S2, no S3/S4, no murmur.  No pretibial edema.  No carotid bruit.  Normal pedal pulses.  Abdomen: Soft, nontender, no hepatosplenomegaly, no distention.  Neurologic: Alert and oriented x 3.  Psych: Normal affect. Extremities: No clubbing or cyanosis.  TELEMETRY: Reviewed telemetry pt in sinus rhythm.  LABS: Basic Metabolic Panel:  Recent Labs  07/02/13 0525  NA 142  K 3.9  CL 110  CO2 19  GLUCOSE 112*  BUN 17  CREATININE 0.85  CALCIUM 9.3   Liver Function Tests: No results found for this basename: AST, ALT, ALKPHOS, BILITOT, PROT, ALBUMIN,  in the last 72 hours No results found for this basename: LIPASE, AMYLASE,  in the last 72 hours CBC: No results found for this basename: WBC, NEUTROABS, HGB, HCT, MCV, PLT,  in the last 72 hours Cardiac Enzymes: No results found for this basename: CKTOTAL, CKMB, CKMBINDEX, TROPONINI,  in the last 72 hours BNP: No components found with this basename: POCBNP,  D-Dimer: No results found for this basename: DDIMER,  in the last 72 hours Hemoglobin A1C: No results found for this basename: HGBA1C,  in the last 72 hours Fasting Lipid Panel: No results found for this basename: CHOL, HDL, LDLCALC, TRIG, CHOLHDL, LDLDIRECT,  in the last 72 hours Thyroid Function Tests: No results found for this basename: TSH, T4TOTAL, FREET3, T3FREE, THYROIDAB,  in the last 72 hours Anemia  Panel:  Recent Labs  07/02/13 0525  RETICCTPCT 1.5    RADIOLOGY: Dg Chest Port 1 View  06/26/2013   CLINICAL DATA:  Chest pain  EXAM: PORTABLE CHEST - 1 VIEW  COMPARISON:  02/15/06  FINDINGS: The heart size and mediastinal contours are within normal limits. Both lungs are clear. The visualized skeletal structures are unremarkable.  IMPRESSION: No active disease.   Electronically Signed   By: Inez Catalina M.D.   On: 06/26/2013 11:52      ASSESSMENT AND PLAN: 1. Atrial fibrillation: Will continue current doses of digoxin, metoprolol, and diltiazem. Although some BP recordings are low, I suspect it may related to cuff size. She is entirely asymptomatic. Continue Eliquis for anticoagulation.  2. Hyperthyroidism: Currently being treated with methimazole.   Dispo: Pt can be discharged. Will sign off.    Kate Sable, M.D., F.A.C.C.

## 2013-07-02 NOTE — Progress Notes (Signed)
Pt a/o.  Vss. Up ad lib. No complaints of any distress. Discharge instructions given. Prescriptions given. Patient   Verbalized understanding of instructions. Patient left floor Via wheelchair with nursing staff and family members.

## 2013-07-02 NOTE — Progress Notes (Signed)
PROGRESS NOTE  Kaitlyn Hull JIR:[id number] DOB: January 22, 1956 DOA: 06/26/2013 PCP: Deloria Lair, MD  Summary: 58 year old woman with history of diabetes and hypertension presented with 2 day history of chest pain, found to have atrial fibrillation with rapid ventricular response (new diagnosis), treated with Cardizem infusion and then digoxin load when rate control could not be achieved. Cardizem discontinued secondary to hypotension and amiodarone infusion required. Subsequently diagnosed with hyperthyroidism.  Assessment/Plan: 1. Atrial fibrillation with RVR. Remains in sinus rhythm/. Continue diltiazem, digoxin, metoprolol. CHADS2Vasc score is 3 (HTN, DM, female), started on Eliquis. 2. New diagnosis hyperthyroidism. Low TSH with high T4. Started on methimazole 5 mg by mouth twice a day.  3. Anion gap metabolic acidosis. Resolved. Likely related to acute illness, respiratory. 4. Possible acute renal failure, resolved.  5. Normocytic anemia. Stable. No evidence of bleeding. Likely secondary to hyperthyroidism. Outpatient followup recommended.  6. DM type 2. Well controlled. Resume metformin. Hemoglobin A1c 6.3. 7. H/o tobacco dependence in remission 8. Consider outpatient assessment for sleep apnea due to body habitus. 9. Extreme obesity class III, BMI 53.37   Discussed with cardiology again 4/30, stable for discharge. Continue metoprolol, digoxin, diltiazem. Continue Eliquis.  F/u with Dr. Dorris Fetch and cardiology as an outpatient has been arranged.  Discharge home today.  Murray Hodgkins, MD  [facility]  Pager [phone] If 7PM-7AM, please contact night-coverage at [email or web address], password [facility] 07/02/2013, 10:51 AM  LOS: 6 days   Consultants:  Cardiology   Procedures:  2-D echocardiogram. LVEF 55-60%. Normal wall motion.  Antibiotics:    HPI/Subjective: No new issues. Feels well. No complaints. Breathing well.  Objective: Filed Vitals:   07/02/13 0730 07/02/13 0800  07/02/13 0900 07/02/13 1000  BP:  119/61 109/63 118/65  Pulse:  98  75  Temp: 97.9 F (36.6 C)     TempSrc: Oral     Resp:      Height:      Weight:      SpO2:  99%  99%    Intake/Output Summary (Last 24 hours) at 07/02/13 1051 Last data filed at 07/02/13 0400  Gross per 24 hour  Intake    150 ml  Output      0 ml  Net    150 ml     Filed Weights   06/30/13 0500 07/01/13 0500 07/02/13 0500  Weight: 133.3 kg (293 lb 14 oz) 132.4 kg (291 lb 14.2 oz) 131.4 kg (289 lb 11 oz)    Exam:   Afebrile. Vitals are stable with mild borderline hypotension intermittently (likely artifactual).  Gen. Appears calm and comfortable, sitting.  Cardiovascular. Regular rate and rhythm. No murmur, rub or gallop. Telemetry sinus rhythm.   Respiratory clear to auscultation bilaterally. No wheezes, rales or rhonchi. Normal respiratory effort.  Data Reviewed:  Capillary blood sugars stable.  Basic metabolic panel unremarkable.  Scheduled Meds: . apixaban  5 mg Oral BID  . digoxin  0.25 mg Oral Daily  . diltiazem  60 mg Oral 3 times per day  . insulin aspart  0-15 Units Subcutaneous TID WC  . insulin aspart  0-5 Units Subcutaneous QHS  . methimazole  5 mg Oral BID  . metoprolol tartrate  50 mg Oral BID  . senna  1 tablet Oral BID  . simvastatin  5 mg Oral q1800   Continuous Infusions:    Principal Problem:   Atrial fibrillation with rapid ventricular response Active Problems:   History of breast cancer   Diabetes   Hypertension  Obesity   New onset a-fib   Hyperthyroidism   Time spent 25  minutes

## 2013-07-14 ENCOUNTER — Other Ambulatory Visit (HOSPITAL_COMMUNITY): Payer: Self-pay | Admitting: "Endocrinology

## 2013-07-14 DIAGNOSIS — E0590 Thyrotoxicosis, unspecified without thyrotoxic crisis or storm: Secondary | ICD-10-CM

## 2013-07-16 ENCOUNTER — Encounter: Payer: Self-pay | Admitting: Adult Health

## 2013-07-16 ENCOUNTER — Ambulatory Visit (INDEPENDENT_AMBULATORY_CARE_PROVIDER_SITE_OTHER): Payer: 59 | Admitting: Adult Health

## 2013-07-16 VITALS — BP 122/68 | HR 93 | Ht 62.0 in | Wt 294.0 lb

## 2013-07-16 DIAGNOSIS — E0590 Thyrotoxicosis, unspecified without thyrotoxic crisis or storm: Secondary | ICD-10-CM

## 2013-07-16 DIAGNOSIS — I4891 Unspecified atrial fibrillation: Secondary | ICD-10-CM

## 2013-07-16 DIAGNOSIS — I10 Essential (primary) hypertension: Secondary | ICD-10-CM

## 2013-07-16 MED ORDER — APIXABAN 5 MG PO TABS: 5.0000 mg | ORAL_TABLET | Freq: Two times a day (BID) | ORAL | Status: DC

## 2013-07-16 MED ORDER — DILTIAZEM HCL ER COATED BEADS 180 MG PO CP24: 180.0000 mg | ORAL_CAPSULE | Freq: Every day | ORAL | Status: DC

## 2013-07-16 MED ORDER — METOPROLOL TARTRATE 50 MG PO TABS
50.0000 mg | ORAL_TABLET | Freq: Two times a day (BID) | ORAL | Status: DC
Start: 2013-07-16 — End: 2014-03-20

## 2013-07-16 MED ORDER — DIGOXIN 250 MCG PO TABS: 0.2500 mg | ORAL_TABLET | Freq: Every day | ORAL | Status: DC

## 2013-07-16 NOTE — Assessment & Plan Note (Signed)
Followed by Dr. Dorris Fetch, endocrinologist. Thyroid study is pending.

## 2013-07-16 NOTE — Progress Notes (Deleted)
Name: Kaitlyn Hull    DOB: February 07, 1956  Age: 58 y.o.  MR#: [id number]       PCP:  Deloria Lair, MD      Insurance: Payor: Theme park manager / Plan: Theme park manager / Product Type: *No Product type* /   CC:    Chief Complaint  Patient presents with  . Atrial Fibrillation    VS Filed Vitals:   07/16/13 1336  BP: 122/68  Pulse: 93  Height: 5\' 2"  (1.575 m)  Weight: 294 lb (133.358 kg)    Weights Current Weight  07/16/13 294 lb (133.358 kg)  07/02/13 289 lb 11 oz (131.4 kg)  05/04/13 302 lb (136.986 kg)    Blood Pressure  BP Readings from Last 3 Encounters:  07/16/13 122/68  07/02/13 118/65  05/04/13 100/80     Admit date:  (Not on file) Last encounter with RMR:  Visit date not found   Allergy Review of patient's allergies indicates no known allergies.  Current Outpatient Prescriptions  Medication Sig Dispense Refill  . apixaban (ELIQUIS) 5 MG TABS tablet Take 1 tablet (5 mg total) by mouth 2 (two) times daily.  60 tablet  0  . digoxin (LANOXIN) 0.25 MG tablet Take 1 tablet (0.25 mg total) by mouth daily.  30 tablet  0  . diltiazem (CARDIZEM) 60 MG tablet Take 1 tablet (60 mg total) by mouth every 8 (eight) hours.  90 tablet  0  . megestrol (MEGACE) 40 MG tablet Take 40 mg by mouth daily.      . metFORMIN (GLUCOPHAGE-XR) 500 MG 24 hr tablet Take 500 mg by mouth 2 (two) times daily.      . methimazole (TAPAZOLE) 5 MG tablet Take 1 tablet (5 mg total) by mouth 2 (two) times daily.  60 tablet  0  . metoprolol (LOPRESSOR) 50 MG tablet Take 1 tablet (50 mg total) by mouth 2 (two) times daily.  60 tablet  0  . Multiple Vitamin (MULTIVITAMIN) tablet Take 1 tablet by mouth daily.      . pravastatin (PRAVACHOL) 40 MG tablet Take 40 mg by mouth at bedtime.       No current facility-administered medications for this visit.    Discontinued Meds:   There are no discontinued medications.  Patient Active Problem List   Diagnosis Date Noted  . Hyperthyroidism 06/29/2013  .  Atrial fibrillation with rapid ventricular response 06/26/2013  . New onset a-fib 06/26/2013  . Postmenopausal bleeding 08/20/2012  . History of breast cancer 07/24/2012  . Diabetes 07/24/2012  . Hypertension 07/24/2012  . Obesity 07/24/2012  . PMB (postmenopausal bleeding) 07/24/2012    LABS    Component Value Date/Time   NA 142 07/02/2013 0525   NA 143 06/29/2013 0437   NA 143 06/28/2013 0458   K 3.9 07/02/2013 0525   K 4.3 06/29/2013 0437   K 4.5 06/28/2013 0458   CL 110 07/02/2013 0525   CL 111 06/29/2013 0437   CL 112 06/28/2013 0458   CO2 19 07/02/2013 0525   CO2 16* 06/29/2013 0437   CO2 18* 06/28/2013 0458   GLUCOSE 112* 07/02/2013 0525   GLUCOSE 124* 06/29/2013 0437   GLUCOSE 129* 06/28/2013 0458   BUN 17 07/02/2013 0525   BUN 22 06/29/2013 0437   BUN 28* 06/28/2013 0458   CREATININE 0.85 07/02/2013 0525   CREATININE 0.81 06/29/2013 0437   CREATININE 0.82 06/28/2013 0458   CALCIUM 9.3 07/02/2013 0525   CALCIUM 9.4 06/29/2013 0437   CALCIUM  9.5 06/28/2013 0458   GFRNONAA 75* 07/02/2013 0525   GFRNONAA 80* 06/29/2013 0437   GFRNONAA 79* 06/28/2013 0458   GFRAA 87* 07/02/2013 0525   GFRAA >90 06/29/2013 0437   GFRAA >90 06/28/2013 0458   CMP     Component Value Date/Time   NA 142 07/02/2013 0525   K 3.9 07/02/2013 0525   CL 110 07/02/2013 0525   CO2 19 07/02/2013 0525   GLUCOSE 112* 07/02/2013 0525   BUN 17 07/02/2013 0525   CREATININE 0.85 07/02/2013 0525   CALCIUM 9.3 07/02/2013 0525   PROT 7.4 08/08/2012 1500   ALBUMIN 3.4* 08/08/2012 1500   AST 40* 08/08/2012 1500   ALT 25 08/08/2012 1500   ALKPHOS 121* 08/08/2012 1500   BILITOT 0.5 08/08/2012 1500   GFRNONAA 75* 07/02/2013 0525   GFRAA 87* 07/02/2013 0525       Component Value Date/Time   WBC 5.9 06/28/2013 0458   WBC 4.7 06/27/2013 0532   WBC 8.9 06/26/2013 1144   HGB 9.7* 06/28/2013 0458   HGB 9.3* 06/27/2013 0532   HGB 11.0* 06/26/2013 1144   HGB 12.2 10/06/2012 1629   HCT 29.1* 06/28/2013 0458   HCT 27.5* 06/27/2013 0532   HCT 32.7*  06/26/2013 1144   MCV 89.0 06/28/2013 0458   MCV 89.6 06/27/2013 0532   MCV 89.1 06/26/2013 1144    Lipid Panel  No results found for this basename: chol, trig, hdl, cholhdl, vldl, ldlcalc    ABG No results found for this basename: phart, pco2, pco2art, po2, po2art, hco3, tco2, acidbasedef, o2sat     Lab Results  Component Value Date   TSH 0.011* 06/26/2013   BNP (last 3 results) No results found for this basename: PROBNP,  in the last 8760 hours Cardiac Panel (last 3 results) No results found for this basename: CKTOTAL, CKMB, TROPONINI, RELINDX,  in the last 72 hours  Iron/TIBC/Ferritin    Component Value Date/Time   IRON 58 07/02/2013 0525   TIBC 211* 07/02/2013 0525   FERRITIN 272 07/02/2013 0525     EKG Orders placed during the hospital encounter of 06/26/13  . EKG 12-LEAD  . EKG 12-LEAD  . EKG 12-LEAD  . EKG 12-LEAD  . EKG     Prior Assessment and Plan Problem List as of 07/16/2013     Cardiovascular and Mediastinum   Hypertension   Atrial fibrillation with rapid ventricular response   New onset a-fib     Endocrine   Diabetes   Hyperthyroidism     Other   History of breast cancer   Obesity   PMB (postmenopausal bleeding)   Postmenopausal bleeding       Imaging: Dg Chest Port 1 View  06/26/2013   CLINICAL DATA:  Chest pain  EXAM: PORTABLE CHEST - 1 VIEW  COMPARISON:  02/15/06  FINDINGS: The heart size and mediastinal contours are within normal limits. Both lungs are clear. The visualized skeletal structures are unremarkable.  IMPRESSION: No active disease.   Electronically Signed   By: Inez Catalina M.D.   On: 06/26/2013 11:52

## 2013-07-16 NOTE — Assessment & Plan Note (Addendum)
She is on 3 rate control medications, metoprolol, digoxin and diltiazem. I will change her dilt to long acting 240mg  from 60 mg QID. She will continue on Eliquiis as directed. No complaints of bleeding or bruising. She will be seen again in 3 months with follow up BMET in one month

## 2013-07-16 NOTE — Progress Notes (Signed)
HPI: Kaitlyn Hull is a 58 year old patient of Dr. Pennelope Bracken worsening post hospitalization after admission for newly diagnosed atrial fibrillation with RVR, also newly diagnosed hyperthyroidism. The patient was started on Eliquis, metoprolol, digoxin, and diltiazem. The patient was started on methimazole. She also was found to have normocytic anemia related to hyperthyroidism. Questionable sleep apnea secondary to morbid obesity. The patient's CHADS VASc Score is 3. On discharge the patient remained in normal sinus rhythm.    Today she is feeling tired, but has no complaints of rapid HR or DOE. She is compliant with her medications. She is to have follow up nuclear study concerning her thyroid next week. She has not had sleep study scheduled per her PCP yet    No Known Allergies  Current Outpatient Prescriptions  Medication Sig Dispense Refill  . apixaban (ELIQUIS) 5 MG TABS tablet Take 1 tablet (5 mg total) by mouth 2 (two) times daily.  60 tablet  0  . digoxin (LANOXIN) 0.25 MG tablet Take 1 tablet (0.25 mg total) by mouth daily.  30 tablet  0  . diltiazem (CARDIZEM) 60 MG tablet Take 1 tablet (60 mg total) by mouth every 8 (eight) hours.  90 tablet  0  . megestrol (MEGACE) 40 MG tablet Take 40 mg by mouth daily.      . metFORMIN (GLUCOPHAGE-XR) 500 MG 24 hr tablet Take 500 mg by mouth 2 (two) times daily.      . methimazole (TAPAZOLE) 5 MG tablet Take 1 tablet (5 mg total) by mouth 2 (two) times daily.  60 tablet  0  . metoprolol (LOPRESSOR) 50 MG tablet Take 1 tablet (50 mg total) by mouth 2 (two) times daily.  60 tablet  0  . Multiple Vitamin (MULTIVITAMIN) tablet Take 1 tablet by mouth daily.      . pravastatin (PRAVACHOL) 40 MG tablet Take 40 mg by mouth at bedtime.       No current facility-administered medications for this visit.    Past Medical History  Diagnosis Date  . Diabetes mellitus     Type II  . Hypertension   . Obesity   . PMB (postmenopausal bleeding) 07/24/2012   Had 16.1 mm endometrium on [place] will get endo biopsy  . Psoriasis   . Breast cancer 2007    left  . Hyperthyroidism 06/2013  . Atrial fibrillation with RVR 06/2013    Past Surgical History  Procedure Laterality Date  . Cesarean section  1995  . Breast surgery  02/20/06    left side   . Hysteroscopy w/d&c N/A 08/13/2012    Procedure: DILATATION AND CURETTAGE /HYSTEROSCOPY;  Surgeon: Florian Buff, MD;  Location: [facility];  Service: Gynecology;  Laterality: N/A;    ROS:  Review of systems complete and found to be negative unless listed above  PHYSICAL EXAM BP 122/68  Pulse 93  Ht 5\' 2"  (1.575 m)  Wt 294 lb (133.358 kg)  BMI 53.76 kg/m2  LMP 09/02/2012 General: Well developed, well nourished, in no acute distress Head: Eyes PERRLA, No xanthomas.   Normal cephalic and atramatic  Lungs: Clear bilaterally to auscultation and percussion. Heart: HRIR S1 S2, without MRG.  Pulses are 2+ & equal.            No carotid bruit. No JVD.  No abdominal bruits. No femoral bruits. Abdomen: Bowel sounds are positive, abdomen soft and non-tender without masses or  Hernia's noted. Msk:  Back normal, normal gait. Normal strength and tone for age. Extremities: No clubbing, cyanosis, non-pitting edema.  DP +1 Neuro: Alert and oriented X 3. Psych:  Good affect, responds appropriately   EKG: Atrial fib 94 bpm with non-specific T-wave abnormalities in the inferior.lateral leads.  ASSESSMENT AND PLAN

## 2013-07-16 NOTE — Assessment & Plan Note (Signed)
Blood pressure is currently well controlled. She is advised to talk with her PCP about sleep study as OSA will greatly affect her blood pressure control and atrial fib.

## 2013-07-16 NOTE — Patient Instructions (Signed)
Your physician recommends that you schedule a follow-up appointment in: 3 months with Dr Virgina Jock will receive a reminder letter two months in advance reminding you to call and schedule your appointment. If you don't receive this letter, please contact our office.  Your physician has recommended you make the following change in your medication:   STOP Diltiazem 60 mg   Start Diltiazem ER 180 mg daily  Your physician recommends that you return for lab work in 1 month. BMET

## 2013-07-17 NOTE — Addendum Note (Signed)
Addended by: Truett Mainland on: 07/17/2013 08:53 AM   Modules accepted: Orders

## 2013-07-28 ENCOUNTER — Encounter (HOSPITAL_COMMUNITY): Payer: 59

## 2013-07-29 ENCOUNTER — Encounter (HOSPITAL_COMMUNITY): Payer: 59

## 2013-07-30 ENCOUNTER — Encounter (HOSPITAL_COMMUNITY)
Admission: RE | Admit: 2013-07-30 | Discharge: 2013-07-30 | Disposition: A | Discharge status: Home / Self Care | Payer: 59 | Source: Ambulatory Visit | Attending: "Endocrinology | Admitting: "Endocrinology

## 2013-07-30 ENCOUNTER — Encounter (HOSPITAL_COMMUNITY): Payer: Self-pay

## 2013-07-30 ENCOUNTER — Encounter (HOSPITAL_COMMUNITY): Payer: 59

## 2013-07-30 DIAGNOSIS — E0590 Thyrotoxicosis, unspecified without thyrotoxic crisis or storm: Secondary | ICD-10-CM

## 2013-07-30 MED ORDER — SODIUM IODIDE I 131 CAPSULE
9.0000 | Freq: Once | INTRAVENOUS | Status: AC | PRN
  Administered 2013-07-30: 9 via ORAL

## 2013-07-31 ENCOUNTER — Encounter (HOSPITAL_COMMUNITY)
Admission: RE | Admit: 2013-07-31 | Discharge: 2013-07-31 | Disposition: A | Discharge status: Home / Self Care | Payer: 59 | Source: Ambulatory Visit | Attending: "Endocrinology | Admitting: "Endocrinology

## 2013-07-31 ENCOUNTER — Encounter (HOSPITAL_COMMUNITY): Payer: Self-pay

## 2013-07-31 DIAGNOSIS — E0590 Thyrotoxicosis, unspecified without thyrotoxic crisis or storm: Secondary | ICD-10-CM | POA: Insufficient documentation

## 2013-07-31 MED ORDER — SODIUM PERTECHNETATE TC 99M INJECTION
10.0000 | Freq: Once | INTRAVENOUS | Status: AC | PRN
Start: 2013-07-31 — End: 2013-07-31
  Administered 2013-07-31: 10 via INTRAVENOUS

## 2013-11-02 ENCOUNTER — Encounter: Payer: Self-pay | Admitting: Cardiovascular Disease

## 2013-11-02 ENCOUNTER — Ambulatory Visit (INDEPENDENT_AMBULATORY_CARE_PROVIDER_SITE_OTHER): Payer: 59 | Admitting: Cardiovascular Disease

## 2013-11-02 VITALS — BP 102/80 | HR 93 | Ht 62.0 in | Wt 274.0 lb

## 2013-11-02 DIAGNOSIS — I4891 Unspecified atrial fibrillation: Secondary | ICD-10-CM

## 2013-11-02 DIAGNOSIS — I480 Paroxysmal atrial fibrillation: Secondary | ICD-10-CM

## 2013-11-02 DIAGNOSIS — E785 Hyperlipidemia, unspecified: Secondary | ICD-10-CM

## 2013-11-02 NOTE — Patient Instructions (Signed)
Your physician wants you to follow-up in: 1 year You will receive a reminder letter in the mail two months in advance. If you don't receive a letter, please call our office to schedule the follow-up appointment.    Your physician recommends that you continue on your current medications as directed. Please refer to the Current Medication list given to you today.     Thank you for choosing [facility] !  

## 2013-11-02 NOTE — Progress Notes (Signed)
Patient ID: Kaitlyn Hull, female   DOB: 15-Dec-1955, 58 y.o.   MRN: [id number]      SUBJECTIVE: The patient is here to followup for atrial fibrillation, for which she takes long-acting diltiazem, metoprolol and apixaban. She also has a history of hyperthyroidism and morbid obesity. She has been doing well and denies chest pain and shortness of breath. Her husband was hospitalized last week for a lumbar compression fracture which caused her to be stressed which led to some occasional palpitations, but overall she has been doing well. She denies leg swelling, orthopnea, and PND.  Review of Systems: As per "subjective", otherwise negative.  No Known Allergies  Current Outpatient Prescriptions  Medication Sig Dispense Refill  . apixaban (ELIQUIS) 5 MG TABS tablet Take 1 tablet (5 mg total) by mouth 2 (two) times daily.  60 tablet  6  . digoxin (LANOXIN) 0.25 MG tablet Take 1 tablet (0.25 mg total) by mouth daily.  30 tablet  6  . diltiazem (CARDIZEM CD) 180 MG 24 hr capsule Take 1 capsule (180 mg total) by mouth daily.  30 capsule  6  . megestrol (MEGACE) 40 MG tablet Take 40 mg by mouth daily.      . metFORMIN (GLUCOPHAGE-XR) 500 MG 24 hr tablet Take 500 mg by mouth 2 (two) times daily.      . methimazole (TAPAZOLE) 5 MG tablet Take 1 tablet (5 mg total) by mouth 2 (two) times daily.  60 tablet  0  . metoprolol (LOPRESSOR) 50 MG tablet Take 1 tablet (50 mg total) by mouth 2 (two) times daily.  60 tablet  6  . Multiple Vitamin (MULTIVITAMIN) tablet Take 1 tablet by mouth daily.      . pravastatin (PRAVACHOL) 40 MG tablet Take 40 mg by mouth at bedtime.       No current facility-administered medications for this visit.    Past Medical History  Diagnosis Date  . Diabetes mellitus     Type II  . Hypertension   . Obesity   . PMB (postmenopausal bleeding) 07/24/2012    Had 16.1 mm endometrium on [place] will get endo biopsy  . Psoriasis   . Breast cancer 2007    left  . Hyperthyroidism  06/2013  . Atrial fibrillation with RVR 06/2013    Past Surgical History  Procedure Laterality Date  . Cesarean section  1995  . Breast surgery  02/20/06    left side   . Hysteroscopy w/d&c N/A 08/13/2012    Procedure: DILATATION AND CURETTAGE /HYSTEROSCOPY;  Surgeon: Florian Buff, MD;  Location: [facility];  Service: Gynecology;  Laterality: N/A;    History   Social History  . Marital Status: Married    Spouse Name: N/A    Number of Children: N/A  . Years of Education: N/A   Occupational History  . Not on file.   Social History Main Topics  . Smoking status: Former Smoker -- 0.01 packs/day for 1 years    Types: Cigarettes    Quit date: 03/06/2003  . Smokeless tobacco: Never Used  . Alcohol Use: No  . Drug Use: No  . Sexual Activity: No   Other Topics Concern  . Not on file   Social History Narrative  . No narrative on file     Filed Vitals:   11/02/13 0844  BP: 102/80  Pulse: 93  Height: 5\' 2"  (1.575 m)  Weight: 274 lb (124.286 kg)  SpO2: 99%   HR manual recheck: 80  bpm  PHYSICAL EXAM General: NAD, obese. HEENT: Normal. Neck: No JVD, no thyromegaly. Lungs: Clear to auscultation bilaterally with normal respiratory effort. CV: Nondisplaced PMI.  Mostly regular rhythm with occasional premature contractions, normal rate, normal S1/S2, no S3/S4, no murmur. No pretibial or periankle edema.  No carotid bruit.  Normal pedal pulses.  Abdomen: Soft, obese. Neurologic: Alert and oriented x 3.  Psych: Normal affect. Skin: Normal. Musculoskeletal: Normal range of motion, no gross deformities. Extremities: No clubbing or cyanosis.   ECG: Most recent ECG reviewed.      ASSESSMENT AND PLAN: 1. Atrial fibrillation: Continue maintenance therapy with digoxin 0.25 mg daily, long-acting diltiazem 180 mg daily and metoprolol 50 mg twice daily. Continue Eliquis for anticoagulation. She reports no bleeding problems.  2. Hyperlipidemia: Lipids most recently checked on  08/18/2013 which demonstrated total cholesterol 127, glycerides 157, HDL 36, LDL 60. Continue moderate intensity statin therapy with pravastatin 40 mg daily.  Dispo: f/u 1 year.  Kate Sable, M.D., F.A.C.C.

## 2013-12-29 ENCOUNTER — Telehealth: Payer: Self-pay | Admitting: Cardiovascular Disease

## 2013-12-29 NOTE — Telephone Encounter (Signed)
Pt waiting to schedule 2 teeth extractions wanting to know if she needs to come off of eliquis before procedure. Will forward to Dr. Bronson Ing

## 2013-12-29 NOTE — Telephone Encounter (Signed)
Please call patient back regarding instructions for Eliquis prior to dental procedure / tgs

## 2013-12-31 ENCOUNTER — Telehealth: Payer: Self-pay

## 2013-12-31 NOTE — Telephone Encounter (Signed)
Message copied by Bernita Raisin on Thu Dec 31, 2013 12:36 PM ------      Message from: Kate Sable A      Created: Thu Dec 31, 2013 12:12 PM      Regarding: RE: when to come off eliquis for dental extraction       Can hold for 2 days prior and resume afterwards.      ----- Message -----         From: Bernita Raisin, RN         Sent: 12/31/2013  11:57 AM           To: Herminio Commons, MD      Subject: when to come off eliquis for dental extracti#             Call Documentation           Desma Mcgregor, CMA at 12/29/2013 12:47 PM           Status: Signed                 Pt waiting to schedule 2 teeth extractions wanting to know if she needs to come off of eliquis before procedure. Will forward to Dr. Delbert Harness at 12/29/2013 12:04 PM           Status: Signed                 Please call patient back regarding instructions for Eliquis prior to dental procedure / tgs                                                 ------

## 2013-12-31 NOTE — Telephone Encounter (Signed)
Spoke with pt and informed her of Dr.Koneswarans advise

## 2014-01-04 ENCOUNTER — Encounter: Payer: Self-pay | Admitting: Cardiovascular Disease

## 2014-01-20 ENCOUNTER — Ambulatory Visit (INDEPENDENT_AMBULATORY_CARE_PROVIDER_SITE_OTHER): Payer: 59 | Admitting: Adult Health

## 2014-01-20 ENCOUNTER — Encounter: Payer: Self-pay | Admitting: Adult Health

## 2014-01-20 ENCOUNTER — Telehealth: Payer: Self-pay | Admitting: Adult Health

## 2014-01-20 VITALS — BP 140/90 | Ht 62.0 in | Wt 272.0 lb

## 2014-01-20 DIAGNOSIS — R350 Frequency of micturition: Secondary | ICD-10-CM

## 2014-01-20 DIAGNOSIS — R319 Hematuria, unspecified: Secondary | ICD-10-CM | POA: Insufficient documentation

## 2014-01-20 HISTORY — DX: Frequency of micturition: R35.0

## 2014-01-20 HISTORY — DX: Hematuria, unspecified: R31.9

## 2014-01-20 LAB — POCT URINALYSIS DIPSTICK
GLUCOSE UA: NEGATIVE
NITRITE UA: NEGATIVE
Protein, UA: NEGATIVE

## 2014-01-20 MED ORDER — SULFAMETHOXAZOLE-TRIMETHOPRIM 800-160 MG PO TABS: 1.0000 | ORAL_TABLET | Freq: Two times a day (BID) | ORAL | Status: DC

## 2014-01-20 NOTE — Telephone Encounter (Signed)
Complains of urinary frequency and pressure to come in at 3 pm

## 2014-01-20 NOTE — Patient Instructions (Signed)
Urinary Tract Infection Urinary tract infections (UTIs) can develop anywhere along your urinary tract. Your urinary tract is your body's drainage system for removing wastes and extra water. Your urinary tract includes two kidneys, two ureters, a bladder, and a urethra. Your kidneys are a pair of bean-shaped organs. Each kidney is about the size of your fist. They are located below your ribs, one on each side of your spine. CAUSES Infections are caused by microbes, which are microscopic organisms, including fungi, viruses, and bacteria. These organisms are so small that they can only be seen through a microscope. Bacteria are the microbes that most commonly cause UTIs. SYMPTOMS  Symptoms of UTIs may vary by age and gender of the patient and by the location of the infection. Symptoms in young women typically include a frequent and intense urge to urinate and a painful, burning feeling in the bladder or urethra during urination. Older women and men are more likely to be tired, shaky, and weak and have muscle aches and abdominal pain. A fever may mean the infection is in your kidneys. Other symptoms of a kidney infection include pain in your back or sides below the ribs, nausea, and vomiting. DIAGNOSIS To diagnose a UTI, your caregiver will ask you about your symptoms. Your caregiver also will ask to provide a urine sample. The urine sample will be tested for bacteria and white blood cells. White blood cells are made by your body to help fight infection. TREATMENT  Typically, UTIs can be treated with medication. Because most UTIs are caused by a bacterial infection, they usually can be treated with the use of antibiotics. The choice of antibiotic and length of treatment depend on your symptoms and the type of bacteria causing your infection. HOME CARE INSTRUCTIONS  If you were prescribed antibiotics, take them exactly as your caregiver instructs you. Finish the medication even if you feel better after you  have only taken some of the medication.  Drink enough water and fluids to keep your urine clear or pale yellow.  Avoid caffeine, tea, and carbonated beverages. They tend to irritate your bladder.  Empty your bladder often. Avoid holding urine for long periods of time.  Empty your bladder before and after sexual intercourse.  After a bowel movement, women should cleanse from front to back. Use each tissue only once. SEEK MEDICAL CARE IF:   You have back pain.  You develop a fever.  Your symptoms do not begin to resolve within 3 days. SEEK IMMEDIATE MEDICAL CARE IF:   You have severe back pain or lower abdominal pain.  You develop chills.  You have nausea or vomiting.  You have continued burning or discomfort with urination. MAKE SURE YOU:   Understand these instructions.  Will watch your condition.  Will get help right away if you are not doing well or get worse. Document Released: 11/29/2004 Document Revised: 08/21/2011 Document Reviewed: 03/30/2011 [facility] Patient Information 2015 [place], [place]. This information is not intended to replace advice given to you by your health care provider. Make sure you discuss any questions you have with your health care provider. Increase fluids Take septra ds 1 bid Follow up prn

## 2014-01-20 NOTE — Progress Notes (Signed)
Subjective:     Patient ID: Kaitlyn Hull, female   DOB: December 21, 1955, 58 y.o.   MRN: [id number]  HPI Kaitlyn Hull is a 58 year old white female in complaining of urinary frequency and pressure.Has decreased volume.No back pain or fever.  Review of Systems See HPI Reviewed past medical,surgical, social and family history. Reviewed medications and allergies.     Objective:   Physical Exam BP 140/90 mmHg  Ht 5\' 2"  (1.575 m)  Wt 272 lb (123.378 kg)  BMI 49.74 kg/m2  LMP 07/01/2014urine 2+ leuks,2+blood, Skin warm and dry.  Lungs: clear to ausculation bilaterally. Cardiovascular: irregular rhythm(has a-fib) Pelvic: external genitalia is normal in appearance, no lesions, vagina: atrophic, cervix: atrophic, uterus: normal size, shape and contour, non tender, no masses felt, adnexa: no masses or tenderness noted. No CVAT.    Assessment:     Urinary frequency Hematuria     Plan:     UA C&S sent Rx septra ds 1 bid x 7 days  Push fluids review handout on UTI

## 2014-01-20 NOTE — Addendum Note (Signed)
Addended by: Linton Rump on: 01/20/2014 04:20 PM   Modules accepted: Orders

## 2014-01-21 LAB — URINALYSIS
Bilirubin Urine: NEGATIVE
GLUCOSE, UA: NEGATIVE mg/dL
KETONES UR: NEGATIVE mg/dL
NITRITE: NEGATIVE
PH: 6 (ref 5.0–8.0)
PROTEIN: NEGATIVE mg/dL
Specific Gravity, Urine: 1.008 (ref 1.005–1.030)
Urobilinogen, UA: 1 mg/dL (ref 0.0–1.0)

## 2014-01-23 LAB — URINE CULTURE: Colony Count: >=100000

## 2014-01-25 ENCOUNTER — Telehealth: Payer: Self-pay | Admitting: Adult Health

## 2014-01-25 MED ORDER — NITROFURANTOIN MONOHYD MACRO 100 MG PO CAPS: 100.0000 mg | ORAL_CAPSULE | Freq: Two times a day (BID) | ORAL | Status: DC

## 2014-01-25 NOTE — Telephone Encounter (Signed)
Pt aware to stop septra ds and take macrobid.Rx macrobid to [facility]

## 2014-03-07 ENCOUNTER — Encounter (HOSPITAL_COMMUNITY): Payer: Self-pay | Admitting: Emergency Medicine

## 2014-03-07 ENCOUNTER — Emergency Department (HOSPITAL_COMMUNITY): Payer: 59

## 2014-03-07 ENCOUNTER — Emergency Department (HOSPITAL_COMMUNITY)
Admission: EM | Admit: 2014-03-07 | Discharge: 2014-03-07 | Disposition: A | Discharge status: Home / Self Care | Payer: 59 | Attending: Emergency Medicine | Admitting: Emergency Medicine

## 2014-03-07 DIAGNOSIS — I4891 Unspecified atrial fibrillation: Secondary | ICD-10-CM | POA: Insufficient documentation

## 2014-03-07 DIAGNOSIS — R079 Chest pain, unspecified: Secondary | ICD-10-CM | POA: Insufficient documentation

## 2014-03-07 DIAGNOSIS — I10 Essential (primary) hypertension: Secondary | ICD-10-CM | POA: Diagnosis not present

## 2014-03-07 DIAGNOSIS — Z79899 Other long term (current) drug therapy: Secondary | ICD-10-CM | POA: Diagnosis not present

## 2014-03-07 DIAGNOSIS — J189 Pneumonia, unspecified organism: Secondary | ICD-10-CM

## 2014-03-07 DIAGNOSIS — E669 Obesity, unspecified: Secondary | ICD-10-CM | POA: Diagnosis not present

## 2014-03-07 DIAGNOSIS — Z87891 Personal history of nicotine dependence: Secondary | ICD-10-CM | POA: Insufficient documentation

## 2014-03-07 DIAGNOSIS — Z7901 Long term (current) use of anticoagulants: Secondary | ICD-10-CM | POA: Diagnosis not present

## 2014-03-07 DIAGNOSIS — Z87448 Personal history of other diseases of urinary system: Secondary | ICD-10-CM | POA: Insufficient documentation

## 2014-03-07 DIAGNOSIS — E119 Type 2 diabetes mellitus without complications: Secondary | ICD-10-CM | POA: Insufficient documentation

## 2014-03-07 DIAGNOSIS — J159 Unspecified bacterial pneumonia: Secondary | ICD-10-CM | POA: Diagnosis not present

## 2014-03-07 DIAGNOSIS — Z853 Personal history of malignant neoplasm of breast: Secondary | ICD-10-CM | POA: Insufficient documentation

## 2014-03-07 LAB — CBC WITH DIFFERENTIAL/PLATELET
BASOS ABS: 0 10*3/uL (ref 0.0–0.1)
Basophils Relative: 0 % (ref 0–1)
Eosinophils Absolute: 0.3 10*3/uL (ref 0.0–0.7)
Eosinophils Relative: 3 % (ref 0–5)
HEMATOCRIT: 39 % (ref 36.0–46.0)
HEMOGLOBIN: 12.7 g/dL (ref 12.0–15.0)
LYMPHS ABS: 2.9 10*3/uL (ref 0.7–4.0)
Lymphocytes Relative: 24 % (ref 12–46)
MCH: 28.6 pg (ref 26.0–34.0)
MCHC: 32.6 g/dL (ref 30.0–36.0)
MCV: 87.8 fL (ref 78.0–100.0)
Monocytes Absolute: 1.1 10*3/uL — ABNORMAL HIGH (ref 0.1–1.0)
Monocytes Relative: 9 % (ref 3–12)
NEUTROS ABS: 7.4 10*3/uL (ref 1.7–7.7)
Neutrophils Relative %: 64 % (ref 43–77)
PLATELETS: 240 10*3/uL (ref 150–400)
RBC: 4.44 MIL/uL (ref 3.87–5.11)
RDW: 13.7 % (ref 11.5–15.5)
WBC: 11.7 10*3/uL — AB (ref 4.0–10.5)

## 2014-03-07 LAB — BASIC METABOLIC PANEL
Anion gap: 7 (ref 5–15)
BUN: 11 mg/dL (ref 6–23)
CALCIUM: 9.1 mg/dL (ref 8.4–10.5)
CHLORIDE: 109 meq/L (ref 96–112)
CO2: 21 mmol/L (ref 19–32)
CREATININE: 0.87 mg/dL (ref 0.50–1.10)
GFR calc Af Amer: 84 mL/min — ABNORMAL LOW (ref >90)
GFR, EST NON AFRICAN AMERICAN: 73 mL/min — AB (ref >90)
GLUCOSE: 175 mg/dL — AB (ref 70–99)
POTASSIUM: 3.7 mmol/L (ref 3.5–5.1)
SODIUM: 137 mmol/L (ref 135–145)

## 2014-03-07 LAB — TROPONIN I: Troponin I: <0.03 ng/mL (ref <0.031)

## 2014-03-07 MED ORDER — AMOXICILLIN 250 MG PO CAPS
1000.0000 mg | ORAL_CAPSULE | Freq: Once | ORAL | Status: AC
  Administered 2014-03-07: 1000 mg via ORAL
  Filled 2014-03-07: qty 4

## 2014-03-07 MED ORDER — AMOXICILLIN 500 MG PO CAPS: 1000.0000 mg | ORAL_CAPSULE | Freq: Two times a day (BID) | ORAL | Status: DC

## 2014-03-07 MED ORDER — NITROGLYCERIN 0.4 MG SL SUBL
0.4000 mg | SUBLINGUAL_TABLET | SUBLINGUAL | Status: DC | PRN
  Administered 2014-03-07 (×2): 0.4 mg via SUBLINGUAL
  Filled 2014-03-07: qty 1

## 2014-03-07 MED ORDER — IOHEXOL 350 MG/ML SOLN
100.0000 mL | Freq: Once | INTRAVENOUS | Status: AC | PRN
  Administered 2014-03-07: 100 mL via INTRAVENOUS

## 2014-03-07 NOTE — ED Notes (Signed)
Pt c/o central chest pain starting earlier today. States she has hx of a. Fib and it feels the same as an episode she had previously.

## 2014-03-07 NOTE — ED Notes (Signed)
Last dose of Nitroglycerin not given due to patient stating she is pain free except for taking deep breaths.

## 2014-03-07 NOTE — Discharge Instructions (Signed)
Pneumonia °Pneumonia is an infection of the lungs.  °CAUSES °Pneumonia may be caused by bacteria or a virus. Usually, these infections are caused by breathing infectious particles into the lungs (respiratory tract). °SIGNS AND SYMPTOMS  °· Cough. °· Fever. °· Chest pain. °· Increased rate of breathing. °· Wheezing. °· Mucus production. °DIAGNOSIS  °If you have the common symptoms of pneumonia, your health care provider will typically confirm the diagnosis with a chest X-ray. The X-ray will show an abnormality in the lung (pulmonary infiltrate) if you have pneumonia. Other tests of your blood, urine, or sputum may be done to find the specific cause of your pneumonia. Your health care provider may also do tests (blood gases or pulse oximetry) to see how well your lungs are working. °TREATMENT  °Some forms of pneumonia may be spread to other people when you cough or sneeze. You may be asked to wear a mask before and during your exam. Pneumonia that is caused by bacteria is treated with antibiotic medicine. Pneumonia that is caused by the influenza virus may be treated with an antiviral medicine. Most other viral infections must run their course. These infections will not respond to antibiotics.  °HOME CARE INSTRUCTIONS  °· Cough suppressants may be used if you are losing too much rest. However, coughing protects you by clearing your lungs. You should avoid using cough suppressants if you can. °· Your health care provider may have prescribed medicine if he or she thinks your pneumonia is caused by bacteria or influenza. Finish your medicine even if you start to feel better. °· Your health care provider may also prescribe an expectorant. This loosens the mucus to be coughed up. °· Take medicines only as directed by your health care provider. °· Do not smoke. Smoking is a common cause of bronchitis and can contribute to pneumonia. If you are a smoker and continue to smoke, your cough may last several weeks after your  pneumonia has cleared. °· A cold steam vaporizer or humidifier in your room or home may help loosen mucus. °· Coughing is often worse at night. Sleeping in a semi-upright position in a recliner or using a couple pillows under your head will help with this. °· Get rest as you feel it is needed. Your body will usually let you know when you need to rest. °PREVENTION °A pneumococcal shot (vaccine) is available to prevent a common bacterial cause of pneumonia. This is usually suggested for: °· People over 65 years old. °· Patients on chemotherapy. °· People with chronic lung problems, such as bronchitis or emphysema. °· People with immune system problems. °If you are over 65 or have a high risk condition, you may receive the pneumococcal vaccine if you have not received it before. In some countries, a routine influenza vaccine is also recommended. This vaccine can help prevent some cases of pneumonia. You may be offered the influenza vaccine as part of your care. °If you smoke, it is time to quit. You may receive instructions on how to stop smoking. Your health care provider can provide medicines and counseling to help you quit. °SEEK MEDICAL CARE IF: °You have a fever. °SEEK IMMEDIATE MEDICAL CARE IF:  °· Your illness becomes worse. This is especially true if you are elderly or weakened from any other disease. °· You cannot control your cough with suppressants and are losing sleep. °· You begin coughing up blood. °· You develop pain which is getting worse or is uncontrolled with medicines. °· Any of the symptoms   which initially brought you in for treatment are getting worse rather than better. °· You develop shortness of breath or chest pain. °MAKE SURE YOU:  °· Understand these instructions. °· Will watch your condition. °· Will get help right away if you are not doing well or get worse. °Document Released: 02/19/2005 Document Revised: 07/06/2013 Document Reviewed: 05/11/2010 °ExitCare® Patient Information ©2015  [employer]. This information is not intended to replace advice given to you by your health care provider. Make sure you discuss any questions you have with your health care provider. ° °Amoxicillin capsules or tablets °What is this medicine? °AMOXICILLIN (a mox i SIL in) is a penicillin antibiotic. It is used to treat certain kinds of bacterial infections. It will not work for colds, flu, or other viral infections. °This medicine may be used for other purposes; ask your health care provider or pharmacist if you have questions. °COMMON BRAND NAME(S): Amoxil, Moxilin, Sumox, Trimox °What should I tell my health care provider before I take this medicine? °They need to know if you have any of these conditions: °-asthma °-kidney disease °-an unusual or allergic reaction to amoxicillin, other penicillins, cephalosporin antibiotics, other medicines, foods, dyes, or preservatives °-pregnant or trying to get pregnant °-breast-feeding °How should I use this medicine? °Take this medicine by mouth with a glass of water. Follow the directions on your prescription label. You may take this medicine with food or on an empty stomach. Take your medicine at regular intervals. Do not take your medicine more often than directed. Take all of your medicine as directed even if you think your are better. Do not skip doses or stop your medicine early. °Talk to your pediatrician regarding the use of this medicine in children. While this drug may be prescribed for selected conditions, precautions do apply. °Overdosage: If you think you have taken too much of this medicine contact a poison control center or emergency room at once. °NOTE: This medicine is only for you. Do not share this medicine with others. °What if I miss a dose? °If you miss a dose, take it as soon as you can. If it is almost time for your next dose, take only that dose. Do not take double or extra doses. °What may interact with this medicine? °-amiloride °-birth control  pills °-chloramphenicol °-macrolides °-probenecid °-sulfonamides °-tetracyclines °This list may not describe all possible interactions. Give your health care provider a list of all the medicines, herbs, non-prescription drugs, or dietary supplements you use. Also tell them if you smoke, drink alcohol, or use illegal drugs. Some items may interact with your medicine. °What should I watch for while using this medicine? °Tell your doctor or health care professional if your symptoms do not improve in 2 or 3 days. Take all of the doses of your medicine as directed. Do not skip doses or stop your medicine early. °If you are diabetic, you may get a false positive result for sugar in your urine with certain brands of urine tests. Check with your doctor. °Do not treat diarrhea with over-the-counter products. Contact your doctor if you have diarrhea that lasts more than 2 days or if the diarrhea is severe and watery. °What side effects may I notice from receiving this medicine? °Side effects that you should report to your doctor or health care professional as soon as possible: °-allergic reactions like skin rash, itching or hives, swelling of the face, lips, or tongue °-breathing problems °-dark urine °-redness, blistering, peeling or loosening of the skin, including   inside the mouth °-seizures °-severe or watery diarrhea °-trouble passing urine or change in the amount of urine °-unusual bleeding or bruising °-unusually weak or tired °-yellowing of the eyes or skin °Side effects that usually do not require medical attention (report to your doctor or health care professional if they continue or are bothersome): °-dizziness °-headache °-stomach upset °-trouble sleeping °This list may not describe all possible side effects. Call your doctor for medical advice about side effects. You may report side effects to FDA at 1-800-FDA-1088. °Where should I keep my medicine? °Keep out of the reach of children. °Store between 68 and 77  degrees F (20 and 25 degrees C). Keep bottle closed tightly. Throw away any unused medicine after the expiration date. °NOTE: This sheet is a summary. It may not cover all possible information. If you have questions about this medicine, talk to your doctor, pharmacist, or health care provider. °© 2015, Elsevier/Gold Standard. (2007-05-13 14:10:59) ° °

## 2014-03-07 NOTE — ED Provider Notes (Signed)
CSN: [id number]     Arrival date & time 03/07/14  0143 History   None    Chief Complaint  Patient presents with  . Chest Pain     (Consider location/radiation/quality/duration/timing/severity/associated sxs/prior Treatment) Patient is a 59 y.o. female presenting with chest pain. The history is provided by the patient.  Chest Pain She has a history of atrial fibrillation. She started having chest pain at about 9 PM. She describes a sharp pain in her central chest and also a dull pain. Pain was 10/10 at its worst but it has subsided to 7/10. There is associated dyspnea, nausea, diaphoresis. She was not aware of any palpitations but states that she has followed this when she has been in atrial fibrillation in the past. She also has cardiac risk factors of diabetes, hyperlipidemia, and hypertension. She is anticoagulated for her atrial fibrillation and takes apixaban, but admits to missing at least 3 doses in the last week. She has noted that her chest pain is worse if she takes a deep breath, worse if she lays on her left side or on her back but seems to be a little better if she lays on her right side.  Past Medical History  Diagnosis Date  . Diabetes mellitus     Type II  . Hypertension   . Obesity   . PMB (postmenopausal bleeding) 07/24/2012    Had 16.1 mm endometrium on [place] will get endo biopsy  . Psoriasis   . Breast cancer 2007    left  . Hyperthyroidism 06/2013  . Atrial fibrillation with RVR 06/2013  . Urinary frequency 01/20/2014  . Hematuria 01/20/2014   Past Surgical History  Procedure Laterality Date  . Cesarean section  1995  . Breast surgery  02/20/06    left side   . Hysteroscopy w/d&c N/A 08/13/2012    Procedure: DILATATION AND CURETTAGE /HYSTEROSCOPY;  Surgeon: Florian Buff, MD;  Location: [facility];  Service: Gynecology;  Laterality: N/A;   Family History  Problem Relation Age of Onset  . Heart failure Father   . CAD Father   . Cancer Sister   . Diabetes Sister   .  Other Sister     blood clots  . Alzheimer's disease Mother   . Diabetes Sister   . Hypertension Maternal Grandmother    History  Substance Use Topics  . Smoking status: Former Smoker -- 0.01 packs/day for 1 years    Types: Cigarettes    Quit date: 03/06/2003  . Smokeless tobacco: Never Used  . Alcohol Use: No   OB History    Gravida Para Term Preterm AB TAB SAB Ectopic Multiple Living   2 2 1       1      Review of Systems  Cardiovascular: Positive for chest pain.  All other systems reviewed and are negative.     Allergies  Review of patient's allergies indicates no known allergies.  Home Medications   Prior to Admission medications   Medication Sig Start Date End Date Taking? Authorizing Provider  apixaban (ELIQUIS) 5 MG TABS tablet Take 1 tablet (5 mg total) by mouth 2 (two) times daily. 07/16/13   Lendon Colonel, NP  digoxin (LANOXIN) 0.25 MG tablet Take 1 tablet (0.25 mg total) by mouth daily. 07/16/13   Lendon Colonel, NP  diltiazem (CARDIZEM CD) 180 MG 24 hr capsule Take 1 capsule (180 mg total) by mouth daily. 07/16/13   Lendon Colonel, NP  lisinopril-hydrochlorothiazide (PRINZIDE,ZESTORETIC) 20-12.5 MG  per tablet Take 1 tablet by mouth daily.    Historical Provider, MD  megestrol (MEGACE) 40 MG tablet Take 40 mg by mouth daily.    Historical Provider, MD  metFORMIN (GLUCOPHAGE-XR) 500 MG 24 hr tablet Take 500 mg by mouth 2 (two) times daily.    Historical Provider, MD  methimazole (TAPAZOLE) 5 MG tablet Take 1 tablet (5 mg total) by mouth 2 (two) times daily. 07/02/13   Samuella Cota, MD  metoprolol (LOPRESSOR) 50 MG tablet Take 1 tablet (50 mg total) by mouth 2 (two) times daily. 07/16/13   Lendon Colonel, NP  nitrofurantoin, macrocrystal-monohydrate, (MACROBID) 100 MG capsule Take 1 capsule (100 mg total) by mouth 2 (two) times daily. 01/25/14   Estill Dooms, NP  pravastatin (PRAVACHOL) 40 MG tablet Take 40 mg by mouth at bedtime.    Historical  Provider, MD   BP 105/76 mmHg  Pulse 91  Temp(Src) 98.8 F (37.1 C) (Oral)  Resp 27  Ht 5\' 3"  (1.6 m)  Wt 272 lb (123.378 kg)  BMI 48.19 kg/m2  SpO2 99%  LMP 09/02/2012 Physical Exam  Nursing note and vitals reviewed.  Morbidly obese 59 year old female, resting comfortably and in no acute distress. Vital signs are significant for tachypnea. Oxygen saturation is 99%, which is normal. Head is normocephalic and atraumatic. PERRLA, EOMI. Oropharynx is clear. Neck is nontender and supple without adenopathy or JVD. Back is nontender and there is no CVA tenderness. Lungs are clear without rales, wheezes, or rhonchi. Chest has mild tenderness in the left parasternal area. Heart has regular rate and rhythm without murmur. Abdomen is soft, flat, nontender without masses or hepatosplenomegaly and peristalsis is normoactive. Extremities have 1+ edema, full range of motion is present. Skin is warm and dry without rash. Neurologic: Mental status is normal, cranial nerves are intact, there are no motor or sensory deficits.   ED Course  Procedures (including critical care time) Labs Review Results for orders placed or performed during the hospital encounter of 03/07/14  CBC with Differential  Result Value Ref Range   WBC 11.7 (H) 4.0 - 10.5 K/uL   RBC 4.44 3.87 - 5.11 MIL/uL   Hemoglobin 12.7 12.0 - 15.0 g/dL   HCT 39.0 36.0 - 46.0 %   MCV 87.8 78.0 - 100.0 fL   MCH 28.6 26.0 - 34.0 pg   MCHC 32.6 30.0 - 36.0 g/dL   RDW 13.7 11.5 - 15.5 %   Platelets 240 150 - 400 K/uL   Neutrophils Relative % 64 43 - 77 %   Neutro Abs 7.4 1.7 - 7.7 K/uL   Lymphocytes Relative 24 12 - 46 %   Lymphs Abs 2.9 0.7 - 4.0 K/uL   Monocytes Relative 9 3 - 12 %   Monocytes Absolute 1.1 (H) 0.1 - 1.0 K/uL   Eosinophils Relative 3 0 - 5 %   Eosinophils Absolute 0.3 0.0 - 0.7 K/uL   Basophils Relative 0 0 - 1 %   Basophils Absolute 0.0 0.0 - 0.1 K/uL  Basic metabolic panel  Result Value Ref Range   Sodium  137 135 - 145 mmol/L   Potassium 3.7 3.5 - 5.1 mmol/L   Chloride 109 96 - 112 mEq/L   CO2 21 19 - 32 mmol/L   Glucose, Bld 175 (H) 70 - 99 mg/dL   BUN 11 6 - 23 mg/dL   Creatinine, Ser 0.87 0.50 - 1.10 mg/dL   Calcium 9.1 8.4 - 10.5 mg/dL  GFR calc non Af Amer 73 (L) >90 mL/min   GFR calc Af Amer 84 (L) >90 mL/min   Anion gap 7 5 - 15  Troponin I  Result Value Ref Range   Troponin I <0.03 <0.031 ng/mL   Imaging Review Dg Chest Portable 1 View  03/07/2014   CLINICAL DATA:  Central sharp chest pain starting today. History of atrial fibrillation.  EXAM: PORTABLE CHEST - 1 VIEW  COMPARISON:  06/26/2013  FINDINGS: The heart size and mediastinal contours are within normal limits. Both lungs are clear. The visualized skeletal structures are unremarkable.  IMPRESSION: No active disease.   Electronically Signed   By: Lucienne Capers M.D.   On: 03/07/2014 02:34     EKG Interpretation   Date/Time:  Sunday March 07 2014 01:57:24 EST Ventricular Rate:  91 PR Interval:    QRS Duration: 86 QT Interval:  345 QTC Calculation: 424 R Axis:   48 Text Interpretation:  Atrial fibrillation Low voltage, precordial leads  Borderline repolarization abnormality When compared with ECG of 06/26/2013,  Nonspecific T wave abnormality is now Present HEART RATE has decreased  Confirmed by [facility]  MD, Keller Bounds ([id number]) on 03/07/2014 2:39:59 AM      MDM   Final diagnoses:  Chest pain, unspecified chest pain type  Community acquired pneumonia    Chest pain of uncertain cause. Atrial fibrillation which does appear to be chronic. All ECGs on record going back through April have demonstrated atrial fibrillation. Because of her noncompliance this past week with anticoagulation, she will be sent for CT angiogram to rule out pulmonary embolism. She'll begin a trial of nitroglycerin for pain and will be given morphine if that fails. Old records are reviewed confirming that she is managed by cardiology for paroxysmal  atrial fibrillation.  Patient is feeling much better. CT angiogram showed a focal area of pneumonia. At this point, she still has slight pain with deep breath. It seems that the pneumonia is the cause of her pain. She is discharged with prescription for amoxicillin and is to follow-up with her PCP in 3 days for reevaluation.  Delora Fuel, MD [id number]

## 2014-03-07 NOTE — ED Notes (Signed)
Discharge instructions and prescription given and reviewed with patient.  Patient verbalized understanding to complete all antibiotic and to follow up with PMD as needed.  Patient ambulatory with steady gait; discharged home in good condition.

## 2014-03-07 NOTE — ED Notes (Signed)
MD at bedside. 

## 2014-03-20 ENCOUNTER — Other Ambulatory Visit: Payer: Self-pay | Admitting: Adult Health

## 2014-03-22 NOTE — Telephone Encounter (Signed)
E-cribed refill complete

## 2014-07-20 ENCOUNTER — Other Ambulatory Visit: Payer: Self-pay | Admitting: Obstetrics & Gynecology

## 2014-08-06 ENCOUNTER — Telehealth: Payer: Self-pay | Admitting: Cardiovascular Disease

## 2014-08-06 MED ORDER — DIGOXIN 250 MCG PO TABS: 250.0000 ug | ORAL_TABLET | Freq: Every day | ORAL | Status: DC

## 2014-08-06 MED ORDER — APIXABAN 5 MG PO TABS: 5.0000 mg | ORAL_TABLET | Freq: Two times a day (BID) | ORAL | Status: DC

## 2014-08-06 MED ORDER — DILTIAZEM HCL ER COATED BEADS 180 MG PO CP24: 180.0000 mg | ORAL_CAPSULE | Freq: Every day | ORAL | Status: DC

## 2014-08-06 MED ORDER — METOPROLOL TARTRATE 50 MG PO TABS: 50.0000 mg | ORAL_TABLET | Freq: Two times a day (BID) | ORAL | Status: DC

## 2014-08-06 NOTE — Telephone Encounter (Signed)
Pt is needs refill on meds

## 2014-10-14 ENCOUNTER — Other Ambulatory Visit: Payer: Self-pay

## 2014-10-14 DIAGNOSIS — Z1231 Encounter for screening mammogram for malignant neoplasm of breast: Secondary | ICD-10-CM

## 2014-10-27 ENCOUNTER — Other Ambulatory Visit (HOSPITAL_COMMUNITY): Payer: Self-pay

## 2014-10-27 DIAGNOSIS — Z1231 Encounter for screening mammogram for malignant neoplasm of breast: Secondary | ICD-10-CM

## 2014-10-29 ENCOUNTER — Ambulatory Visit: Payer: 59

## 2014-11-01 ENCOUNTER — Ambulatory Visit (HOSPITAL_COMMUNITY): Payer: 59

## 2014-11-01 ENCOUNTER — Ambulatory Visit (HOSPITAL_COMMUNITY)
Admission: RE | Admit: 2014-11-01 | Discharge: 2014-11-01 | Disposition: A | Discharge status: Home / Self Care | Payer: 59 | Source: Ambulatory Visit | Attending: Family Medicine | Admitting: Family Medicine

## 2014-11-01 DIAGNOSIS — Z1231 Encounter for screening mammogram for malignant neoplasm of breast: Secondary | ICD-10-CM

## 2014-11-02 ENCOUNTER — Ambulatory Visit (INDEPENDENT_AMBULATORY_CARE_PROVIDER_SITE_OTHER): Payer: 59 | Admitting: Cardiovascular Disease

## 2014-11-02 ENCOUNTER — Encounter: Payer: Self-pay | Admitting: Cardiovascular Disease

## 2014-11-02 VITALS — BP 124/73 | HR 89 | Ht 62.0 in | Wt 270.0 lb

## 2014-11-02 DIAGNOSIS — I4891 Unspecified atrial fibrillation: Secondary | ICD-10-CM | POA: Diagnosis not present

## 2014-11-02 DIAGNOSIS — R5383 Other fatigue: Secondary | ICD-10-CM | POA: Diagnosis not present

## 2014-11-02 DIAGNOSIS — I10 Essential (primary) hypertension: Secondary | ICD-10-CM | POA: Diagnosis not present

## 2014-11-02 DIAGNOSIS — E785 Hyperlipidemia, unspecified: Secondary | ICD-10-CM

## 2014-11-02 MED ORDER — DILTIAZEM HCL ER COATED BEADS 120 MG PO CP24: 120.0000 mg | ORAL_CAPSULE | Freq: Two times a day (BID) | ORAL | Status: DC

## 2014-11-02 NOTE — Patient Instructions (Signed)
Your physician has recommended you make the following change in your medication:  Increase your diltiazem to 120 mg twice daily. Continue all other medications the same. Your physician recommends that you schedule a follow-up appointment in: 1 year. You will receive a reminder letter in the mail in about 10 months reminding you to call and schedule your appointment. If you don't receive this letter, please contact our office.

## 2014-11-02 NOTE — Progress Notes (Signed)
Patient ID: Kaitlyn Hull, female   DOB: 08/30/1955, 59 y.o.   MRN: [id number]      SUBJECTIVE: The patient is here to followup for atrial fibrillation, for which she takes long-acting diltiazem, metoprolol and apixaban. She also has a history of hyperthyroidism and morbid obesity. She has been doing well and denies chest pain and shortness of breath. Sometimes feels fatigued at the end of the work day, but denies leg swelling, orthopnea, PND, dizziness, and syncope. Echo in 06/2013 showed normal LV systolic function, EF 11-94%.   Review of Systems: As per "subjective", otherwise negative.  No Known Allergies  Current Outpatient Prescriptions  Medication Sig Dispense Refill  . apixaban (ELIQUIS) 5 MG TABS tablet Take 1 tablet (5 mg total) by mouth 2 (two) times daily. 60 tablet 6  . digoxin (LANOXIN) 0.25 MG tablet Take 1 tablet (250 mcg total) by mouth daily. (Patient taking differently: Take 250 mcg by mouth daily. TAKE ONE TABLET ONE DAY AND 1/2 TABLET ON ALTERNATING DAYS) 30 tablet 6  . diltiazem (CARDIZEM CD) 180 MG 24 hr capsule Take 1 capsule (180 mg total) by mouth daily. 30 capsule 6  . lisinopril-hydrochlorothiazide (PRINZIDE,ZESTORETIC) 20-12.5 MG per tablet Take 1 tablet by mouth daily.    . megestrol (MEGACE) 40 MG tablet Take 40 mg by mouth daily.    . metFORMIN (GLUCOPHAGE-XR) 500 MG 24 hr tablet Take 500 mg by mouth 2 (two) times daily.    . methimazole (TAPAZOLE) 5 MG tablet Take 1 tablet (5 mg total) by mouth 2 (two) times daily. 60 tablet 0  . metoprolol (LOPRESSOR) 50 MG tablet Take 1 tablet (50 mg total) by mouth 2 (two) times daily. 60 tablet 6  . nitrofurantoin, macrocrystal-monohydrate, (MACROBID) 100 MG capsule Take 1 capsule (100 mg total) by mouth 2 (two) times daily. 14 capsule 0  . pravastatin (PRAVACHOL) 40 MG tablet Take 40 mg by mouth at bedtime.     No current facility-administered medications for this visit.    Past Medical History  Diagnosis Date  .  Diabetes mellitus     Type II  . Hypertension   . Obesity   . PMB (postmenopausal bleeding) 07/24/2012    Had 16.1 mm endometrium on [place] will get endo biopsy  . Psoriasis   . Breast cancer 2007    left  . Hyperthyroidism 06/2013  . Atrial fibrillation with RVR 06/2013  . Urinary frequency 01/20/2014  . Hematuria 01/20/2014    Past Surgical History  Procedure Laterality Date  . Cesarean section  1995  . Breast surgery  02/20/06    left side   . Hysteroscopy w/d&c N/A 08/13/2012    Procedure: DILATATION AND CURETTAGE /HYSTEROSCOPY;  Surgeon: Florian Buff, MD;  Location: [facility];  Service: Gynecology;  Laterality: N/A;    Social History   Social History  . Marital Status: Married    Spouse Name: N/A  . Number of Children: N/A  . Years of Education: N/A   Occupational History  . Not on file.   Social History Main Topics  . Smoking status: Former Smoker -- 0.01 packs/day for 1 years    Types: Cigarettes    Start date: 03/05/1976    Quit date: 03/06/2003  . Smokeless tobacco: Never Used  . Alcohol Use: No  . Drug Use: No  . Sexual Activity: No   Other Topics Concern  . Not on file   Social History Narrative     Filed Vitals:  11/02/14 0815  BP: 124/73  Pulse: 89  Height: 5\' 2"  (1.575 m)  Weight: 270 lb (122.471 kg)  SpO2: 97%    PHYSICAL EXAM General: NAD, obese. HEENT: Normal. Neck: No JVD, no thyromegaly. Lungs: Clear to auscultation bilaterally with normal respiratory effort. CV: Upper normal rate, irregular rhythm, normal S1/S2, no S3, no murmur. No pretibial or periankle edema.  Abdomen: Soft, obese. Neurologic: Alert and oriented x 3.  Psych: Normal affect. Skin: Normal. Musculoskeletal: Normal range of motion, no gross deformities. Extremities: No clubbing or cyanosis.   ECG: Most recent ECG reviewed.      ASSESSMENT AND PLAN: 1. Fatigue in context of atrial fibrillation: I wonder if fatigue may be related to higher heart rates with  exertion. Will switch Cardizem CD to 120 mg bid. Continue maintenance therapy with digoxin 0.25 mg daily and metoprolol 50 mg twice daily. Continue Eliquis for anticoagulation. She reports no bleeding problems.  2. Hyperlipidemia: Obtain copy of lipids from PCP. Continue moderate intensity statin therapy with pravastatin 40 mg daily.  3. Essential HTN: Controlled. Monitor given increased dose of diltiazem.  Dispo: f/u 1 year.   Kate Sable, M.D., F.A.C.C.

## 2014-11-22 ENCOUNTER — Telehealth: Payer: Self-pay | Admitting: Adult Health

## 2014-11-22 NOTE — Telephone Encounter (Signed)
Has noticed some vaginal bleeding to make appt, will probably need [place] to assess

## 2014-11-24 ENCOUNTER — Ambulatory Visit (INDEPENDENT_AMBULATORY_CARE_PROVIDER_SITE_OTHER): Payer: 59 | Admitting: Adult Health

## 2014-11-24 ENCOUNTER — Encounter: Payer: Self-pay | Admitting: Adult Health

## 2014-11-24 VITALS — BP 122/84 | HR 76 | Ht 62.0 in | Wt 274.0 lb

## 2014-11-24 DIAGNOSIS — Z853 Personal history of malignant neoplasm of breast: Secondary | ICD-10-CM

## 2014-11-24 DIAGNOSIS — N950 Postmenopausal bleeding: Secondary | ICD-10-CM

## 2014-11-24 NOTE — Patient Instructions (Signed)
Postmenopausal Bleeding Postmenopausal bleeding is any bleeding a woman has after she has entered into menopause. Menopause is the end of a woman's fertile years. After menopause, a woman no longer ovulates or has menstrual periods.  Postmenopausal bleeding can be caused by various things. Any type of postmenopausal bleeding, even if it appears to be a typical menstrual period, is concerning. This should be evaluated by your health care provider. Any treatment will depend on the cause of the bleeding. HOME CARE INSTRUCTIONS Monitor your condition for any changes. The following actions may help to alleviate any discomfort you are experiencing:  Avoid the use of tampons and douches as directed by your health care provider.  Change your pads frequently.  Get regular pelvic exams and Pap tests.  Keep all follow-up appointments for diagnostic tests as directed by your health care provider. SEEK MEDICAL CARE IF:   Your bleeding lasts more than 1 week.  You have abdominal pain.  You have bleeding with sexual intercourse. SEEK IMMEDIATE MEDICAL CARE IF:   You have a fever, chills, headache, dizziness, muscle aches, and bleeding.  You have severe pain with bleeding.  You are passing blood clots.  You have bleeding and need more than 1 pad an hour.  You feel faint. MAKE SURE YOU:  Understand these instructions.  Will watch your condition.  Will get help right away if you are not doing well or get worse. Document Released: 05/30/2005 Document Revised: 12/10/2012 Document Reviewed: 09/18/2012 [facility] Patient Information 2015 [place], [place]. This information is not intended to replace advice given to you by your health care provider. Make sure you discuss any questions you have with your health care provider. Return in 2 days for [place]

## 2014-11-24 NOTE — Progress Notes (Signed)
Subjective:     Patient ID: KATERI BALCH, female   DOB: [birth year], 59 y.o.   MRN: [id number]  HPI Deatrice is a 59 year old white female, married having vaginal bleeding since 9/16, she is PM and has had history of breast cancer and has had bleeding once before, is taking megace.  Review of Systems Patient denies any headaches, hearing loss, fatigue, blurred vision, shortness of breath, chest pain, problems with bowel movements, urination, or intercourse(not having sex). No joint pain or mood swings.+ vaginal bleeding and some cramps  Reviewed past medical,surgical, social and family history. Reviewed medications and allergies.     Objective:   Physical Exam BP 122/84 mmHg  Pulse 76  Ht 5\' 2"  (1.575 m)  Wt 274 lb (124.286 kg)  BMI 50.10 kg/m2  LMP 11/17/2014 Skin warm and dry.Pelvic: external genitalia is normal in appearance no lesions, vagina: +period like blood,,urethra has no lesions or masses noted, cervix:smooth, uterus: normal size, shape and contour, non tender, no masses felt, adnexa: no masses or tenderness noted. Bladder is non tender and no masses felt    Assessment:     PMB History of breast cancer    Plan:     Return in 2 days for gyn [place] Review handout on PMB

## 2014-11-26 ENCOUNTER — Telehealth: Payer: Self-pay | Admitting: Adult Health

## 2014-11-26 ENCOUNTER — Ambulatory Visit (INDEPENDENT_AMBULATORY_CARE_PROVIDER_SITE_OTHER): Payer: 59

## 2014-11-26 DIAGNOSIS — N950 Postmenopausal bleeding: Secondary | ICD-10-CM | POA: Diagnosis not present

## 2014-11-26 NOTE — Progress Notes (Signed)
PELVIC US TA/TV: heterogenous anteverted uterus w/ a 3.5 x 2.9 x 2.5 cm post intramural fibroid,complex thick EEC 36.31mm (difficult to see the boarders of the endometrium) normal ov's bilat(limited  view of ov's) no free fluid seen,no pain during ultrasound.

## 2014-11-26 NOTE — Telephone Encounter (Signed)
Pt had [place] today has 36.8 mm complex endometrium,needs endo biopsy with Dr Elonda Husky

## 2014-12-06 ENCOUNTER — Ambulatory Visit (INDEPENDENT_AMBULATORY_CARE_PROVIDER_SITE_OTHER): Payer: 59 | Admitting: Obstetrics & Gynecology

## 2014-12-06 ENCOUNTER — Encounter: Payer: Self-pay | Admitting: Obstetrics & Gynecology

## 2014-12-06 ENCOUNTER — Other Ambulatory Visit: Payer: Self-pay | Admitting: Obstetrics & Gynecology

## 2014-12-06 VITALS — BP 110/80 | HR 80 | Wt 271.0 lb

## 2014-12-06 DIAGNOSIS — N950 Postmenopausal bleeding: Secondary | ICD-10-CM

## 2014-12-06 NOTE — Progress Notes (Signed)
Patient ID: Kaitlyn Hull, female   DOB: 10/03/1955, 59 y.o.   MRN: [id number] Endometrial Biopsy Procedure Note  Pre-operative Diagnosis: PMB, recurrent on megestrol  Post-operative Diagnosis: same  Indications: postmenopausal bleeding  Procedure Details   Urine pregnancy test was not done.  The risks (including infection, bleeding, pain, and uterine perforation) and benefits of the procedure were explained to the patient and Written informed consent was obtained.  Antibiotic prophylaxis against endocarditis was not indicated.   The patient was placed in the dorsal lithotomy position.  Bimanual exam showed the uterus to be in the neutral position.  A Graves' speculum inserted in the vagina, and the cervix prepped with povidone iodine.  Endocervical curettage with a Kevorkian curette was not performed.   A sharp tenaculum was applied to the anterior lip of the cervix for stabilization.  A sterile uterine sound was used to sound the uterus to a depth of 7.5cm.  A Mylex 69mm curette was used to sample the endometrium.  Sample was sent for pathologic examination.  Condition: Stable  Complications: None  Plan:  The patient was advised to call for any fever or for prolonged or severe pain or bleeding. She was advised to use OTC ibuprofen as needed for mild to moderate pain. She was advised to avoid vaginal intercourse for 48 hours or until the bleeding has completely stopped.  Attending Physician Documentation: I was present for or performed the following: endometrial biopsy

## 2014-12-06 NOTE — Addendum Note (Signed)
Addended by: Diona Fanti A on: 12/06/2014 12:37 PM   Modules accepted: Orders

## 2014-12-13 ENCOUNTER — Ambulatory Visit (INDEPENDENT_AMBULATORY_CARE_PROVIDER_SITE_OTHER): Payer: 59 | Admitting: Obstetrics & Gynecology

## 2014-12-13 ENCOUNTER — Encounter: Payer: Self-pay | Admitting: Obstetrics & Gynecology

## 2014-12-13 VITALS — BP 110/80 | HR 80 | Wt 273.0 lb

## 2014-12-13 DIAGNOSIS — N950 Postmenopausal bleeding: Secondary | ICD-10-CM

## 2014-12-13 MED ORDER — PROGESTERONE MICRONIZED 200 MG PO CAPS: ORAL_CAPSULE | ORAL | Status: DC

## 2014-12-13 NOTE — Progress Notes (Signed)
Patient ID: Kaitlyn Hull, female   DOB: 1955/06/20, 59 y.o.   MRN: [id number] Chief Complaint  Patient presents with  . Follow-up    result biopsy.    Blood pressure 110/80, pulse 80, weight 273 lb (123.832 kg), last menstrual period 11/17/2014.  59 y.o. G2P1001 Patient's last menstrual period was 11/17/2014. The current method of family planning is post menopausal status.  Subjective Pt here for biopsy report and on going management of post menopausal bleeding  Objective biospy is benign, no atypia hyperplasia or malignancy, progestational effect secondary to megestrol  Pertinent ROS   Labs or studies     Impression Diagnoses this Encounter::   ICD-9-CM ICD-10-CM   1. Postmenopausal bleeding 627.1 N95.0     Established relevant diagnosis(es): Breast cancer history pathologicl evidence of endogenous estrogen production requiring progestational suppression  Plan/Recommendations: Meds ordered this encounter  Medications  . progesterone (PROMETRIUM) 200 MG capsule    Sig: Take 1 tablet nightly    Dispense:  30 capsule    Refill:  11    Labs or Scans Ordered: No orders of the defined types were placed in this encounter.    Will stop megace and switch to prometrium 200 for 10 days per month  Follow up Return in about 1 year (around 12/13/2015).      Face to face time:  15 minutes  Greater than 50% of the visit time was spent in counseling and coordination of care with the patient.  The summary and outline of the counseling and care coordination is summarized in the note above.   All questions were answered.

## 2015-03-21 ENCOUNTER — Other Ambulatory Visit: Payer: Self-pay | Admitting: Cardiovascular Disease

## 2015-04-23 ENCOUNTER — Other Ambulatory Visit: Payer: Self-pay | Admitting: Cardiovascular Disease

## 2015-04-25 ENCOUNTER — Other Ambulatory Visit: Payer: Self-pay

## 2015-04-25 MED ORDER — DIGOXIN 250 MCG PO TABS: 0.2500 mg | ORAL_TABLET | Freq: Every day | ORAL | Status: DC

## 2015-04-25 NOTE — Telephone Encounter (Signed)
Refill- digoxin °

## 2015-06-23 ENCOUNTER — Other Ambulatory Visit: Payer: Self-pay

## 2015-06-23 MED ORDER — DILTIAZEM HCL ER COATED BEADS 120 MG PO CP24: 120.0000 mg | ORAL_CAPSULE | Freq: Two times a day (BID) | ORAL | Status: DC

## 2015-06-23 NOTE — Telephone Encounter (Signed)
Refill complete diltiazem

## 2015-10-07 ENCOUNTER — Ambulatory Visit: Payer: 59 | Admitting: Nutrition

## 2015-10-11 ENCOUNTER — Encounter (HOSPITAL_COMMUNITY): Payer: Self-pay

## 2015-10-11 ENCOUNTER — Observation Stay (HOSPITAL_COMMUNITY)
Admission: EM | Admit: 2015-10-11 | Discharge: 2015-10-13 | Disposition: A | Discharge status: Home / Self Care | Payer: 59 | Attending: Internal Medicine | Admitting: Internal Medicine

## 2015-10-11 ENCOUNTER — Emergency Department (HOSPITAL_COMMUNITY): Payer: 59

## 2015-10-11 DIAGNOSIS — Z87891 Personal history of nicotine dependence: Secondary | ICD-10-CM | POA: Insufficient documentation

## 2015-10-11 DIAGNOSIS — Z7984 Long term (current) use of oral hypoglycemic drugs: Secondary | ICD-10-CM | POA: Insufficient documentation

## 2015-10-11 DIAGNOSIS — Z853 Personal history of malignant neoplasm of breast: Secondary | ICD-10-CM | POA: Diagnosis not present

## 2015-10-11 DIAGNOSIS — I10 Essential (primary) hypertension: Secondary | ICD-10-CM | POA: Diagnosis present

## 2015-10-11 DIAGNOSIS — R079 Chest pain, unspecified: Secondary | ICD-10-CM | POA: Diagnosis present

## 2015-10-11 DIAGNOSIS — Z79899 Other long term (current) drug therapy: Secondary | ICD-10-CM | POA: Insufficient documentation

## 2015-10-11 DIAGNOSIS — E119 Type 2 diabetes mellitus without complications: Secondary | ICD-10-CM | POA: Diagnosis not present

## 2015-10-11 DIAGNOSIS — K219 Gastro-esophageal reflux disease without esophagitis: Secondary | ICD-10-CM

## 2015-10-11 DIAGNOSIS — E0590 Thyrotoxicosis, unspecified without thyrotoxic crisis or storm: Secondary | ICD-10-CM | POA: Diagnosis present

## 2015-10-11 DIAGNOSIS — I482 Chronic atrial fibrillation, unspecified: Secondary | ICD-10-CM | POA: Diagnosis present

## 2015-10-11 LAB — BASIC METABOLIC PANEL
ANION GAP: 10 (ref 5–15)
BUN: 13 mg/dL (ref 6–20)
CALCIUM: 9.6 mg/dL (ref 8.9–10.3)
CO2: 23 mmol/L (ref 22–32)
CREATININE: 0.99 mg/dL (ref 0.44–1.00)
Chloride: 104 mmol/L (ref 101–111)
GFR calc non Af Amer: >60 mL/min (ref >60)
Glucose, Bld: 149 mg/dL — ABNORMAL HIGH (ref 65–99)
Potassium: 4 mmol/L (ref 3.5–5.1)
SODIUM: 137 mmol/L (ref 135–145)

## 2015-10-11 LAB — CBC
HCT: 38.6 % (ref 36.0–46.0)
HEMOGLOBIN: 12.2 g/dL (ref 12.0–15.0)
MCH: 28.4 pg (ref 26.0–34.0)
MCHC: 31.6 g/dL (ref 30.0–36.0)
MCV: 90 fL (ref 78.0–100.0)
PLATELETS: 202 10*3/uL (ref 150–400)
RBC: 4.29 MIL/uL (ref 3.87–5.11)
RDW: 14.2 % (ref 11.5–15.5)
WBC: 10.2 10*3/uL (ref 4.0–10.5)

## 2015-10-11 LAB — TROPONIN I

## 2015-10-11 MED ORDER — ASPIRIN 325 MG PO TABS
325.0000 mg | ORAL_TABLET | Freq: Once | ORAL | Status: AC
  Administered 2015-10-11: 325 mg via ORAL
  Filled 2015-10-11: qty 1

## 2015-10-11 MED ORDER — NITROGLYCERIN 0.4 MG SL SUBL
0.4000 mg | SUBLINGUAL_TABLET | SUBLINGUAL | Status: DC | PRN
  Administered 2015-10-11 (×3): 0.4 mg via SUBLINGUAL
  Filled 2015-10-11: qty 1

## 2015-10-11 MED ORDER — METOPROLOL TARTRATE 5 MG/5ML IV SOLN
5.0000 mg | Freq: Once | INTRAVENOUS | Status: AC
Start: 2015-10-11 — End: 2015-10-11
  Administered 2015-10-11: 5 mg via INTRAVENOUS
  Filled 2015-10-11: qty 5

## 2015-10-11 NOTE — ED Triage Notes (Signed)
Chest pain with SOB started at noon today. History of AFib

## 2015-10-11 NOTE — ED Provider Notes (Signed)
[facility] DEPT Provider Note   CSN: [id number] Arrival date & time: 10/11/15  1947  First Provider Contact:  First MD Initiated Contact with Patient 10/11/15 1955        History   Chief Complaint Chief Complaint  Patient presents with  . Chest Pain    HPI Kaitlyn Hull is a 60 y.o. female.  Pt noticed some chest pain with sob that started at noon today.  Pt said that it would not go away, so she finally came into the ED.  Pt has a hx of a.fib, but has never had CAD.  She has never had a stress or a cath.      Past Medical History:  Diagnosis Date  . Atrial fibrillation with RVR ([place]) 06/2013  . Breast cancer ([place]) 2007   left  . Diabetes mellitus    Type II  . Hematuria 01/20/2014  . Hypertension   . Hyperthyroidism 06/2013  . Obesity   . PMB (postmenopausal bleeding) 07/24/2012   Had 16.1 mm endometrium on [place] will get endo biopsy  . Psoriasis   . Urinary frequency 01/20/2014    Patient Active Problem List   Diagnosis Date Noted  . Urinary frequency 01/20/2014  . Hematuria 01/20/2014  . Hyperthyroidism 06/29/2013  . New onset a-fib ([place]) 06/26/2013  . Postmenopausal bleeding 08/20/2012  . History of breast cancer 07/24/2012  . Diabetes ([place]) 07/24/2012  . Hypertension 07/24/2012  . Obesity 07/24/2012  . PMB (postmenopausal bleeding) 07/24/2012    Past Surgical History:  Procedure Laterality Date  . BREAST SURGERY  02/20/06   left side   . CESAREAN SECTION  1995  . HYSTEROSCOPY W/D&C N/A 08/13/2012   Procedure: DILATATION AND CURETTAGE /HYSTEROSCOPY;  Surgeon: Florian Buff, MD;  Location: [facility];  Service: Gynecology;  Laterality: N/A;    OB History    Gravida Para Term Preterm AB Living   2 2 1     1    SAB TAB Ectopic Multiple Live Births                   Home Medications    Prior to Admission medications   Medication Sig Start Date End Date Taking? Authorizing Provider  digoxin (LANOXIN) 0.25 MG tablet Take 1 tablet (0.25 mg total) by  mouth daily. 04/25/15  Yes Herminio Commons, MD  diltiazem (CARDIZEM CD) 120 MG 24 hr capsule Take 1 capsule (120 mg total) by mouth 2 (two) times daily. 06/23/15  Yes Herminio Commons, MD  ELIQUIS 5 MG TABS tablet TAKE ONE TABLET BY MOUTH TWICE DAILY 03/22/15  Yes Herminio Commons, MD  metFORMIN (GLUCOPHAGE-XR) 500 MG 24 hr tablet Take 500-1,000 mg by mouth 2 (two) times daily. 2 in the morning and 1 in the evening   Yes Historical Provider, MD  metoprolol (LOPRESSOR) 50 MG tablet TAKE ONE TABLET BY MOUTH TWICE DAILY 03/22/15  Yes Herminio Commons, MD  pravastatin (PRAVACHOL) 40 MG tablet Take 40 mg by mouth at bedtime.   Yes Historical Provider, MD  progesterone (PROMETRIUM) 200 MG capsule Take 1 tablet nightly Patient taking differently: Take 200 mg by mouth at bedtime. Take 1 tablet nightly for the 1st 10 days of each month 12/13/14  Yes Florian Buff, MD    Family History Family History  Problem Relation Age of Onset  . Heart failure Father   . CAD Father   . Cancer Sister   . Diabetes Sister   . Other  Sister     blood clots  . Alzheimer's disease Mother   . Diabetes Sister   . Hypertension Maternal Grandmother     Social History Social History  Substance Use Topics  . Smoking status: Former Smoker    Packs/day: 0.01    Years: 1.00    Types: Cigarettes    Start date: 03/05/1976    Quit date: 03/06/2003  . Smokeless tobacco: Never Used  . Alcohol use No     Allergies   Review of patient's allergies indicates no known allergies.   Review of Systems Review of Systems  Respiratory: Positive for shortness of breath.   Cardiovascular: Positive for chest pain.  All other systems reviewed and are negative.    Physical Exam Updated Vital Signs BP 133/100   Pulse 118   Temp 99.3 F (37.4 C) (Oral)   Resp 23   Ht 5\' 3"  (1.6 m)   Wt 275 lb (124.7 kg)   LMP 11/17/2014   SpO2 96%   BMI 48.71 kg/m   Physical Exam  Constitutional: She is oriented to person,  place, and time. She appears well-developed and well-nourished.  HENT:  Head: Normocephalic and atraumatic.  Right Ear: External ear normal.  Left Ear: External ear normal.  Nose: Nose normal.  Mouth/Throat: Oropharynx is clear and moist.  Eyes: Conjunctivae and EOM are normal. Pupils are equal, round, and reactive to light.  Neck: Normal range of motion. Neck supple.  Cardiovascular: Normal heart sounds and intact distal pulses.  An irregular rhythm present. Tachycardia present.   Pulmonary/Chest: Effort normal and breath sounds normal.  Abdominal: Soft. Bowel sounds are normal.  Musculoskeletal: Normal range of motion.  Neurological: She is alert and oriented to person, place, and time.  Skin: Skin is warm and dry.  Psychiatric: She has a normal mood and affect. Her behavior is normal. Judgment and thought content normal.  Nursing note and vitals reviewed.    ED Treatments / Results  Labs (all labs ordered are listed, but only abnormal results are displayed) Labs Reviewed  BASIC METABOLIC PANEL - Abnormal; Notable for the following:       Result Value   Glucose, Bld 149 (*)    All other components within normal limits  CBC  TROPONIN I    EKG  EKG Interpretation  Date/Time:  Tuesday October 11 2015 19:59:19 EDT Ventricular Rate:  104 PR Interval:    QRS Duration: 71 QT Interval:  406 QTC Calculation: 535 R Axis:   83 Text Interpretation:  Atrial fibrillation Low voltage, precordial leads Nonspecific repol abnormality, diffuse leads Prolonged QT interval Confirmed by Gilford Raid MD, Zakye Baby ([id number]) on 10/11/2015 8:06:12 PM       Radiology Dg Chest 2 View  Result Date: 10/11/2015 CLINICAL DATA:  Chest pain EXAM: CHEST  2 VIEW COMPARISON:  None. FINDINGS: The heart size and mediastinal contours are within normal limits. Both lungs are clear. The visualized skeletal structures are unremarkable. IMPRESSION: No active cardiopulmonary disease. Electronically Signed   By: Dorise Bullion III M.D   On: 10/11/2015 20:49    Procedures Procedures (including critical care time)  Medications Ordered in ED Medications  nitroGLYCERIN (NITROSTAT) SL tablet 0.4 mg (0.4 mg Sublingual Given 10/11/15 2015)  metoprolol (LOPRESSOR) injection 5 mg (not administered)  aspirin tablet 325 mg (325 mg Oral Given 10/11/15 2004)     Initial Impression / Assessment and Plan / ED Course  I have reviewed the triage vital signs and the nursing notes.  Pertinent labs & imaging results that were available during my care of the patient were reviewed by me and considered in my medical decision making (see chart for details).  Clinical Course   Pt's CP is resolved after nitro and pt looks better.  She has never had a stress test and has multiple risk factors. She has a heart score of 4, so I will speak to the hospitalist for admission.  Dr. Shanon Brow will admit for observation.  Final Clinical Impressions(s) / ED Diagnoses   Final diagnoses:  Chest pain, unspecified chest pain type  Chronic atrial fibrillation [facility])    New Prescriptions New Prescriptions   No medications on file     Isla Pence, MD 10/11/15 2223

## 2015-10-12 ENCOUNTER — Observation Stay (HOSPITAL_BASED_OUTPATIENT_CLINIC_OR_DEPARTMENT_OTHER): Payer: 59

## 2015-10-12 ENCOUNTER — Encounter (HOSPITAL_COMMUNITY): Payer: Self-pay

## 2015-10-12 DIAGNOSIS — R079 Chest pain, unspecified: Secondary | ICD-10-CM

## 2015-10-12 DIAGNOSIS — R0789 Other chest pain: Secondary | ICD-10-CM | POA: Diagnosis not present

## 2015-10-12 DIAGNOSIS — I482 Chronic atrial fibrillation: Secondary | ICD-10-CM | POA: Diagnosis not present

## 2015-10-12 DIAGNOSIS — E0590 Thyrotoxicosis, unspecified without thyrotoxic crisis or storm: Secondary | ICD-10-CM

## 2015-10-12 LAB — LIPID PANEL
Cholesterol: 171 mg/dL (ref 0–200)
HDL: 46 mg/dL (ref >40)
LDL CALC: 103 mg/dL — AB (ref 0–99)
Total CHOL/HDL Ratio: 3.7 RATIO
Triglycerides: 109 mg/dL (ref <150)
VLDL: 22 mg/dL (ref 0–40)

## 2015-10-12 LAB — GLUCOSE, CAPILLARY
GLUCOSE-CAPILLARY: 173 mg/dL — AB (ref 65–99)
Glucose-Capillary: 162 mg/dL — ABNORMAL HIGH (ref 65–99)

## 2015-10-12 LAB — TSH: TSH: 2.226 u[IU]/mL (ref 0.350–4.500)

## 2015-10-12 LAB — TROPONIN I: Troponin I: <0.03 ng/mL (ref <0.03)

## 2015-10-12 LAB — DIGOXIN LEVEL: DIGOXIN LVL: 0.9 ng/mL (ref 0.8–2.0)

## 2015-10-12 LAB — CK TOTAL AND CKMB (NOT AT ARMC)
CK TOTAL: 36 U/L — AB (ref 38–234)
CK TOTAL: 35 U/L — AB (ref 38–234)
CK, MB: 0.6 ng/mL (ref 0.5–5.0)
CK, MB: 0.8 ng/mL (ref 0.5–5.0)
RELATIVE INDEX: INVALID (ref 0.0–2.5)
Relative Index: INVALID (ref 0.0–2.5)

## 2015-10-12 LAB — ECHOCARDIOGRAM COMPLETE
Height: 63 in
Weight: 4367.22 oz

## 2015-10-12 MED ORDER — METOPROLOL TARTRATE 50 MG PO TABS
50.0000 mg | ORAL_TABLET | Freq: Two times a day (BID) | ORAL | Status: DC
  Administered 2015-10-12 – 2015-10-13 (×3): 50 mg via ORAL
  Filled 2015-10-12 (×3): qty 1

## 2015-10-12 MED ORDER — DIGOXIN 125 MCG PO TABS
0.2500 mg | ORAL_TABLET | Freq: Every day | ORAL | Status: DC
  Administered 2015-10-12 – 2015-10-13 (×2): 0.25 mg via ORAL
  Filled 2015-10-12 (×2): qty 2

## 2015-10-12 MED ORDER — DILTIAZEM HCL ER COATED BEADS 120 MG PO CP24
120.0000 mg | ORAL_CAPSULE | Freq: Two times a day (BID) | ORAL | Status: DC
  Administered 2015-10-12 – 2015-10-13 (×3): 120 mg via ORAL
  Filled 2015-10-12 (×3): qty 1

## 2015-10-12 MED ORDER — INSULIN ASPART 100 UNIT/ML ~~LOC~~ SOLN
0.0000 [IU] | Freq: Three times a day (TID) | SUBCUTANEOUS | Status: DC
  Administered 2015-10-12: 1 [IU] via SUBCUTANEOUS
  Administered 2015-10-12: 2 [IU] via SUBCUTANEOUS
  Administered 2015-10-13: 1 [IU] via SUBCUTANEOUS
  Administered 2015-10-13: 5 [IU] via SUBCUTANEOUS

## 2015-10-12 MED ORDER — METOPROLOL TARTRATE 50 MG PO TABS
50.0000 mg | ORAL_TABLET | Freq: Once | ORAL | Status: AC
  Administered 2015-10-12: 50 mg via ORAL
  Filled 2015-10-12: qty 1

## 2015-10-12 MED ORDER — APIXABAN 5 MG PO TABS
5.0000 mg | ORAL_TABLET | Freq: Two times a day (BID) | ORAL | Status: DC
  Administered 2015-10-12 – 2015-10-13 (×3): 5 mg via ORAL
  Filled 2015-10-12 (×3): qty 1

## 2015-10-12 MED ORDER — ASPIRIN EC 81 MG PO TBEC
81.0000 mg | DELAYED_RELEASE_TABLET | Freq: Every day | ORAL | Status: DC
  Administered 2015-10-12 – 2015-10-13 (×2): 81 mg via ORAL
  Filled 2015-10-12 (×2): qty 1

## 2015-10-12 MED ORDER — PRAVASTATIN SODIUM 40 MG PO TABS
40.0000 mg | ORAL_TABLET | Freq: Every day | ORAL | Status: DC
  Administered 2015-10-12: 40 mg via ORAL
  Filled 2015-10-12: qty 1

## 2015-10-12 MED ORDER — INSULIN ASPART 100 UNIT/ML ~~LOC~~ SOLN: 0.0000 [IU] | Freq: Every day | SUBCUTANEOUS | Status: DC

## 2015-10-12 MED ORDER — PANTOPRAZOLE SODIUM 40 MG PO TBEC
40.0000 mg | DELAYED_RELEASE_TABLET | Freq: Every day | ORAL | Status: DC
  Administered 2015-10-12 – 2015-10-13 (×2): 40 mg via ORAL
  Filled 2015-10-12 (×2): qty 1

## 2015-10-12 MED ORDER — ACETAMINOPHEN 325 MG PO TABS
650.0000 mg | ORAL_TABLET | Freq: Four times a day (QID) | ORAL | Status: DC | PRN
  Administered 2015-10-12 – 2015-10-13 (×2): 650 mg via ORAL
  Filled 2015-10-12 (×3): qty 2

## 2015-10-12 NOTE — Progress Notes (Signed)
Patient seen and examined. Admitted after midnight secondary to CP. Atypical and with pleuritic component in nature. Patient had heart score of 4. No fever, no SOB, no nausea, no vomiting. CP is essentially resolved now. Normal vital signs Please refer to H&P written by Dr. Shanon Brow for further info/details on admission.  Plan: -follow 2-D echo -troponin neg X 3 so far -most likely home in am -will add PPI -CT angio neg for dissection and/or PE   Barton Dubois [id number]

## 2015-10-12 NOTE — Progress Notes (Signed)
*  PRELIMINARY RESULTS* Echocardiogram 2D Echocardiogram has been performed.  Kaitlyn Hull 10/12/2015, 4:59 PM

## 2015-10-12 NOTE — H&P (Signed)
History and Physical    EVALETTE COULTON [id number] DOB: 1955/07/06 DOA: 10/11/2015  PCP: Deloria Lair, MD  Patient coming from: home  Chief Complaint:   Chest pain  HPI: MARKEESHA URBAEZ is a 60 y.o. female with medical history significant of chronic afib, obesity, htn comes in with sscp with palpiatiions earlier this evening.  Pt reports she can tell when her heart rate is high as she gets chest discomfort.  Denies any fevers.  No recent illnesses.  No n/v/d.  No sob or swelling in her legs.  Pain does not radiate.  Pain was relieved on arrival with ntg sublingual.  Pt referred for admission for possible ACS.   Review of Systems: As per HPI otherwise 10 point review of systems negative.   Past Medical History:  Diagnosis Date  . Atrial fibrillation with RVR ([place]) 06/2013  . Breast cancer ([place]) 2007   left  . Diabetes mellitus    Type II  . Hematuria 01/20/2014  . Hypertension   . Hyperthyroidism 06/2013  . Obesity   . PMB (postmenopausal bleeding) 07/24/2012   Had 16.1 mm endometrium on [place] will get endo biopsy  . Psoriasis   . Urinary frequency 01/20/2014    Past Surgical History:  Procedure Laterality Date  . BREAST SURGERY  02/20/06   left side   . CESAREAN SECTION  1995  . HYSTEROSCOPY W/D&C N/A 08/13/2012   Procedure: DILATATION AND CURETTAGE /HYSTEROSCOPY;  Surgeon: Florian Buff, MD;  Location: [facility];  Service: Gynecology;  Laterality: N/A;     reports that she quit smoking about 12 years ago. Her smoking use included Cigarettes. She started smoking about 39 years ago. She has a 0.01 pack-year smoking history. She has never used smokeless tobacco. She reports that she does not drink alcohol or use drugs.  No Known Allergies  Family History  Problem Relation Age of Onset  . Heart failure Father   . CAD Father   . Cancer Sister   . Diabetes Sister   . Other Sister     blood clots  . Alzheimer's disease Mother   . Diabetes Sister   . Hypertension Maternal  Grandmother     Prior to Admission medications   Medication Sig Start Date End Date Taking? Authorizing Provider  digoxin (LANOXIN) 0.25 MG tablet Take 1 tablet (0.25 mg total) by mouth daily. 04/25/15  Yes Herminio Commons, MD  diltiazem (CARDIZEM CD) 120 MG 24 hr capsule Take 1 capsule (120 mg total) by mouth 2 (two) times daily. 06/23/15  Yes Herminio Commons, MD  ELIQUIS 5 MG TABS tablet TAKE ONE TABLET BY MOUTH TWICE DAILY 03/22/15  Yes Herminio Commons, MD  metFORMIN (GLUCOPHAGE-XR) 500 MG 24 hr tablet Take 500-1,000 mg by mouth 2 (two) times daily. 2 in the morning and 1 in the evening   Yes Historical Provider, MD  metoprolol (LOPRESSOR) 50 MG tablet TAKE ONE TABLET BY MOUTH TWICE DAILY 03/22/15  Yes Herminio Commons, MD  pravastatin (PRAVACHOL) 40 MG tablet Take 40 mg by mouth at bedtime.   Yes Historical Provider, MD  progesterone (PROMETRIUM) 200 MG capsule Take 1 tablet nightly Patient taking differently: Take 200 mg by mouth at bedtime. Take 1 tablet nightly for the 1st 10 days of each month 12/13/14  Yes Florian Buff, MD    Physical Exam: Vitals:   10/11/15 2100 10/11/15 2130 10/11/15 2200 10/11/15 2230  BP: 133/100 112/74 108/55 120/66  Pulse: 118  97 100 96  Resp: 23 20 (!) 27 26  Temp:      TempSrc:      SpO2: 96% 98% 96% 98%  Weight:      Height:          Constitutional: NAD, calm, comfortable Vitals:   10/11/15 2100 10/11/15 2130 10/11/15 2200 10/11/15 2230  BP: 133/100 112/74 108/55 120/66  Pulse: 118 97 100 96  Resp: 23 20 (!) 27 26  Temp:      TempSrc:      SpO2: 96% 98% 96% 98%  Weight:      Height:       Eyes: PERRL, lids and conjunctivae normal ENMT: Mucous membranes are moist. Posterior pharynx clear of any exudate or lesions.Normal dentition.  Neck: normal, supple, no masses, no thyromegaly Respiratory: clear to auscultation bilaterally, no wheezing, no crackles. Normal respiratory effort. No accessory muscle use.  Cardiovascular:  Regular rate and rhythm, no murmurs / rubs / gallops. No extremity edema. 2+ pedal pulses. No carotid bruits.  Abdomen: no tenderness, no masses palpated. No hepatosplenomegaly. Bowel sounds positive.  Musculoskeletal: no clubbing / cyanosis. No joint deformity upper and lower extremities. Good ROM, no contractures. Normal muscle tone.  Skin: no rashes, lesions, ulcers. No induration Neurologic: CN 2-12 grossly intact. Sensation intact, DTR normal. Strength 5/5 in all 4.  Psychiatric: Normal judgment and insight. Alert and oriented x 3. Normal mood.    Labs on Admission: I have personally reviewed following labs and imaging studies  CBC:  Recent Labs Lab 10/11/15 2029  WBC 10.2  HGB 12.2  HCT 38.6  MCV 90.0  PLT 123XX123   Basic Metabolic Panel:  Recent Labs Lab 10/11/15 2029  NA 137  K 4.0  CL 104  CO2 23  GLUCOSE 149*  BUN 13  CREATININE 0.99  CALCIUM 9.6   GFR: Estimated Creatinine Clearance: 79.5 mL/min (by C-G formula based on SCr of 0.99 mg/dL).  Cardiac Enzymes:  Recent Labs Lab 10/11/15 2029  TROPONINI <0.03   Urine analysis:    Component Value Date/Time   COLORURINE YELLOW 01/20/2014 1620   APPEARANCEUR CLEAR 01/20/2014 1620   LABSPEC 1.008 01/20/2014 1620   PHURINE 6.0 01/20/2014 1620   GLUCOSEU NEG 01/20/2014 1620   HGBUR MOD (A) 01/20/2014 1620   BILIRUBINUR NEG 01/20/2014 1620   KETONESUR NEG 01/20/2014 1620   PROTEINUR NEG 01/20/2014 1620   UROBILINOGEN 1 01/20/2014 1620   NITRITE NEG 01/20/2014 1620   LEUKOCYTESUR LARGE (A) 01/20/2014 1620    Radiological Exams on Admission: Dg Chest 2 View  Result Date: 10/11/2015 CLINICAL DATA:  Chest pain EXAM: CHEST  2 VIEW COMPARISON:  None. FINDINGS: The heart size and mediastinal contours are within normal limits. Both lungs are clear. The visualized skeletal structures are unremarkable. IMPRESSION: No active cardiopulmonary disease. Electronically Signed   By: Dorise Bullion III M.D   On: 10/11/2015  20:49    EKG: Independently reviewed. afib no acute issues  Assessment/Plan 60 yo female with chronic afib on eloquis with atypical chest pain  Principal Problem:   Chest pain- romi, serial troponin.  On eloquis.  On aspirin.  cardaic echo in the am, arrange for outpatient stress testing if all normal here.    Active Problems:   Diabetes ([place])- ssi   Hypertension- noted   Chronic a-fib ([place])- give an extra dose of metoprolol po tonight,  HR around 100   Hyperthyroidism- check tsh   obs on tele.  Orders written due  to epic downtime.   DVT prophylaxis:  eloquis Code Status:   Full code  Wayde Gopaul A MD [facility]  If 7PM-7AM, please contact night-coverage [email or web address] Password [email or web address]  10/12/2015, 1:34 AM

## 2015-10-13 ENCOUNTER — Ambulatory Visit: Payer: 59

## 2015-10-13 DIAGNOSIS — I482 Chronic atrial fibrillation: Secondary | ICD-10-CM | POA: Diagnosis not present

## 2015-10-13 DIAGNOSIS — E119 Type 2 diabetes mellitus without complications: Secondary | ICD-10-CM | POA: Diagnosis not present

## 2015-10-13 DIAGNOSIS — R0789 Other chest pain: Secondary | ICD-10-CM | POA: Diagnosis not present

## 2015-10-13 DIAGNOSIS — I10 Essential (primary) hypertension: Secondary | ICD-10-CM

## 2015-10-13 DIAGNOSIS — K219 Gastro-esophageal reflux disease without esophagitis: Secondary | ICD-10-CM

## 2015-10-13 LAB — CBC
HCT: 36.5 % (ref 36.0–46.0)
Hemoglobin: 11.6 g/dL — ABNORMAL LOW (ref 12.0–15.0)
MCH: 28.4 pg (ref 26.0–34.0)
MCHC: 31.8 g/dL (ref 30.0–36.0)
MCV: 89.5 fL (ref 78.0–100.0)
PLATELETS: 209 10*3/uL (ref 150–400)
RBC: 4.08 MIL/uL (ref 3.87–5.11)
RDW: 14.1 % (ref 11.5–15.5)
WBC: 10.4 10*3/uL (ref 4.0–10.5)

## 2015-10-13 LAB — BASIC METABOLIC PANEL
Anion gap: 10 (ref 5–15)
BUN: 11 mg/dL (ref 6–20)
CALCIUM: 9.1 mg/dL (ref 8.9–10.3)
CO2: 28 mmol/L (ref 22–32)
CREATININE: 0.93 mg/dL (ref 0.44–1.00)
Chloride: 99 mmol/L — ABNORMAL LOW (ref 101–111)
GFR calc non Af Amer: >60 mL/min (ref >60)
GLUCOSE: 164 mg/dL — AB (ref 65–99)
Potassium: 3.9 mmol/L (ref 3.5–5.1)
Sodium: 137 mmol/L (ref 135–145)

## 2015-10-13 LAB — HEMOGLOBIN A1C
HEMOGLOBIN A1C: 7.7 % — AB (ref 4.8–5.6)
Mean Plasma Glucose: 174 mg/dL

## 2015-10-13 LAB — GLUCOSE, CAPILLARY
GLUCOSE-CAPILLARY: 150 mg/dL — AB (ref 65–99)
Glucose-Capillary: 265 mg/dL — ABNORMAL HIGH (ref 65–99)

## 2015-10-13 MED ORDER — PANTOPRAZOLE SODIUM 40 MG PO TBEC: 40.0000 mg | DELAYED_RELEASE_TABLET | Freq: Every day | ORAL | 1 refills | Status: DC

## 2015-10-13 NOTE — Progress Notes (Signed)
Patient with orders to be discharge home. Discharge instructions given, patient verbalized understanding. Patient stable. Patient left in private vehicle with family.  

## 2015-10-13 NOTE — Discharge Summary (Signed)
Physician Discharge Summary  Kaitlyn Hull [id number] DOB: Dec 16, 1955 DOA: 10/11/2015  PCP: Deloria Lair, MD  Admit date: 10/11/2015 Discharge date: 10/13/2015  Time spent: 35 minutes  Recommendations for Outpatient Follow-up:  1. Repeat BMET to follow electrolytes and renal function    Discharge Diagnoses:  Principal Problem:   Pain in the chest Active Problems:   Diabetes ([place])   Hypertension   Chronic atrial fibrillation ([place])   Hyperthyroidism   Esophageal reflux   Discharge Condition: stable and improved. No CP or SOB at discharge. Discharge home with instructions to follow up with PCP in 10 days and with her cardiologist as previously scheduled.   Diet recommendation: heart healthy and low carb diet   Filed Weights   10/12/15 0000 10/12/15 0548 10/13/15 0452  Weight: 123.8 kg (272 lb 15.6 oz) 123.8 kg (272 lb 15.2 oz) 119.8 kg (264 lb 1.8 oz)    History of present illness:  As per H&P written by Dr. Shanon Brow on 10/11/15 60 y.o. female with medical history significant of chronic afib, obesity, htn comes in with sscp with palpiatiions earlier this evening.  Pt reports she can tell when her heart rate is high as she gets chest discomfort.  Denies any fevers.  No recent illnesses.  No n/v/d.  No sob or swelling in her legs.  Pain does not radiate.  Pain was relieved on arrival with ntg sublingual.  Pt referred for admission for possible ACS.  Hospital Course:  1-chest pain: atypical and presumed to be secondary to GERD -troponin neg X3 -telemetry and EKG w/o acute ischemic changes -patient with reassuring Echo showing preserved EF and no wall motion abnormalities  -discharge on PPI -continue B-blocker and statins -outpatient follow up with her cardiologist   2-atrial fibrillation: chronic -rate controlled -will continue B-blocker, cardizem and digoxin -anticoagulation using eliquis -CHADsVASC score 3  3-HTN: -stable and well controlled -will continue current  antihypertensive regimen   4-HLD: -will continue statins  5-diabetes type 2 -stable and with controlled CBG's -will continue oral hypoglycemic regimen  -advise to follow low carb diet   6-obesity -Body mass index is 46.79 kg/m. -low calorie diet and exercise discussed with patient   7-GERD -discharge on PPI   Procedures:  2-D echo - Left ventricle: The cavity size was normal. Wall thickness was   increased in a pattern of moderate LVH. Systolic function was   vigorous. The estimated ejection fraction was in the range of 65%   to 70%. Wall motion was normal; there were no regional wall   motion abnormalities. The study was not technically sufficient to   allow evaluation of LV diastolic dysfunction due to atrial   fibrillation. - Aortic valve: Mildly calcified annulus. Trileaflet; mildly   thickened leaflets. Valve area (VTI): 1.95 cm^2. Valve area   (Vmax): 1.85 cm^2. - Left atrium: The atrium was mildly dilated. - Right ventricle: The cavity size was mildly dilated. - Technically adequate study.   Consultations:  None   Discharge Exam: Vitals:   10/12/15 2110 10/13/15 0452  BP: (!) 123/54 117/77  Pulse: (!) 108 74  Resp: (!) 24 (!) 22  Temp: 98.5 F (36.9 C) 98 F (36.7 C)    General: afebrile, denies CP, SOB and diaphoresis.   Cardiovascular: rate controlled, irregular, no rubs , no gallops Respiratory: CTA bilaterally Abd: soft, NT, ND, positive BS Extremities: trace edema bilaterally Neurologic exam: non focal  Discharge Instructions   Discharge Instructions    Diet - low sodium  heart healthy    Complete by:  As directed   Discharge instructions    Complete by:  As directed   Keep yourself well hydrated Take medications as prescribed Follow up with PCP in 10 days Outpatient follow up with your cardiologist as previously scheduled Follow heart healthy and modified carbohydrates diet     Current Discharge Medication List    START taking  these medications   Details  pantoprazole (PROTONIX) 40 MG tablet Take 1 tablet (40 mg total) by mouth daily. Qty: 30 tablet, Refills: 1      CONTINUE these medications which have NOT CHANGED   Details  digoxin (LANOXIN) 0.25 MG tablet Take 1 tablet (0.25 mg total) by mouth daily. Qty: 90 tablet, Refills: 3    diltiazem (CARDIZEM CD) 120 MG 24 hr capsule Take 1 capsule (120 mg total) by mouth 2 (two) times daily. Qty: 60 capsule, Refills: 4    ELIQUIS 5 MG TABS tablet TAKE ONE TABLET BY MOUTH TWICE DAILY Qty: 60 tablet, Refills: 6    metFORMIN (GLUCOPHAGE-XR) 500 MG 24 hr tablet Take 500-1,000 mg by mouth 2 (two) times daily. 2 in the morning and 1 in the evening    metoprolol (LOPRESSOR) 50 MG tablet TAKE ONE TABLET BY MOUTH TWICE DAILY Qty: 60 tablet, Refills: 6    pravastatin (PRAVACHOL) 40 MG tablet Take 40 mg by mouth at bedtime.    progesterone (PROMETRIUM) 200 MG capsule Take 1 tablet nightly Qty: 30 capsule, Refills: 11       No Known Allergies Follow-up Information    TAPPER,DAVID B, MD. Schedule an appointment as soon as possible for a visit in 10 day(s).   Specialty:  Family Medicine Contact information: [place] [id number] [phone]            The results of significant diagnostics from this hospitalization (including imaging, microbiology, ancillary and laboratory) are listed below for reference.    Significant Diagnostic Studies: Dg Chest 2 View  Result Date: 10/11/2015 CLINICAL DATA:  Chest pain EXAM: CHEST  2 VIEW COMPARISON:  None. FINDINGS: The heart size and mediastinal contours are within normal limits. Both lungs are clear. The visualized skeletal structures are unremarkable. IMPRESSION: No active cardiopulmonary disease. Electronically Signed   By: Dorise Bullion III M.D   On: 10/11/2015 20:49    Microbiology: No results found for this or any previous visit (from the past 240 hour(s)).   Labs: Basic Metabolic  Panel:  Recent Labs Lab 10/11/15 2029 10/13/15 0539  NA 137 137  K 4.0 3.9  CL 104 99*  CO2 23 28  GLUCOSE 149* 164*  BUN 13 11  CREATININE 0.99 0.93  CALCIUM 9.6 9.1   CBC:  Recent Labs Lab 10/11/15 2029 10/13/15 0539  WBC 10.2 10.4  HGB 12.2 11.6*  HCT 38.6 36.5  MCV 90.0 89.5  PLT 202 209   Cardiac Enzymes:  Recent Labs Lab 10/11/15 2029 10/12/15 0244 10/12/15 0805 10/12/15 1407  CKTOTAL  --  36* 35*  --   CKMB  --  0.6 0.8  --   TROPONINI <0.03 <0.03 <0.03 <0.03   CBG:  Recent Labs Lab 10/12/15 1619 10/12/15 2115 10/13/15 0754 10/13/15 1133  GLUCAP 162* 173* 150* 265*    Signed:  Barton Dubois MD.  [facility] 10/13/2015, 12:23 PM

## 2015-10-13 NOTE — Care Management Note (Signed)
Case Management Note  Patient Details  Name: Kaitlyn Hull MRN: [id number] Date of Birth: [birth year]  Subjective/Objective:                  Pt reviewed for CM needs. Pt from home with spouse. Pt has PCP and insurance with drug coverage. Pt in ind with ADL's.   Action/Plan: Anticipate return home with self care. No CM needs anticipated.   Expected Discharge Date:     11/13/2015             Expected Discharge Plan:  Home/Self Care  In-House Referral:  NA  Discharge planning Services  CM Consult  Post Acute Care Choice:  NA Choice offered to:  NA  DME Arranged:    DME Agency:     HH Arranged:    HH Agency:     Status of Service:  Completed, signed off  If discussed at [employer] of Stay Meetings, dates discussed:    Additional Comments:  Sherald Barge, RN 10/13/2015, 9:29 AM

## 2015-10-14 ENCOUNTER — Encounter: Payer: 59 | Attending: Family Medicine | Admitting: Nutrition

## 2015-10-14 ENCOUNTER — Encounter: Payer: Self-pay | Admitting: Nutrition

## 2015-10-14 VITALS — Ht 63.0 in | Wt 253.0 lb

## 2015-10-14 DIAGNOSIS — Z713 Dietary counseling and surveillance: Secondary | ICD-10-CM | POA: Insufficient documentation

## 2015-10-14 DIAGNOSIS — R079 Chest pain, unspecified: Secondary | ICD-10-CM | POA: Diagnosis not present

## 2015-10-14 DIAGNOSIS — E119 Type 2 diabetes mellitus without complications: Secondary | ICD-10-CM | POA: Insufficient documentation

## 2015-10-14 DIAGNOSIS — E669 Obesity, unspecified: Secondary | ICD-10-CM

## 2015-10-14 DIAGNOSIS — E118 Type 2 diabetes mellitus with unspecified complications: Secondary | ICD-10-CM

## 2015-10-14 DIAGNOSIS — E1165 Type 2 diabetes mellitus with hyperglycemia: Secondary | ICD-10-CM

## 2015-10-14 DIAGNOSIS — IMO0002 Reserved for concepts with insufficient information to code with codable children: Secondary | ICD-10-CM

## 2015-10-14 NOTE — Progress Notes (Signed)
  Medical Nutrition Therapy:  Appt start time: 1330 end time:  1430.  Assessment:  Primary concerns today: Diabetes Type 2. A1C 7.7%. Just got out of the hospital for chest pain. Willing to make changes with diet to improve weight and BS.  Metformin 500 BID.  Preferred Learning Style:    No preference indicated   Learning Readiness:     Ready  Change in progress   MEDICATIONS:see list   DIETARY INTAKE:  Eating 2 meals per day. Misc snacks.    Usual physical activity: limited  Estimated energy needs: 1500 calories 170 g carbohydrates 112 g protein 42 g fat  Progress Towards Goal(s):  In progress.   Nutritional Diagnosis:  NB-1.1 Food and nutrition-related knowledge deficit As related to DM.  As evidenced by A1C 7.7%..    Intervention:  Nutrition and Diabetes education provided on My Plate, CHO counting, meal planning, portion sizes, timing of meals, avoiding snacks between meals unless having a low blood sugar, target ranges for A1C and blood sugars, signs/symptoms and treatment of hyper/hypoglycemia, monitoring blood sugars, taking medications as prescribed, benefits of exercising 30 minutes per day and prevention of complications of DM. Marland KitchenGoals 1. Follow Plate Method 2. Eat better balanced breakfast 3. Eat 2-3 carb choices per meal 4. Increase lose carb vegetables. 5. Drink more water 6. Test blood sugars in am daily. 7. Get A1C less than 7% in three months  Teaching Method Utilized:  Visual Auditory Hands on  Handouts given during visit include:  The Plate Method   Meal Plan Card  DM  Barriers to learning/adherence to lifestyle change: None  Demonstrated degree of understanding via:  Teach Back   Monitoring/Evaluation:  Dietary intake, exercise, meal planning, SBG, and body weight in 1 month(s).

## 2015-10-14 NOTE — Patient Instructions (Signed)
Goals 1. Follow Plate Method 2. Eat better balanced breakfast 3. Eat 2-3 carb choices per meal 4. Increase lose carb vegetables. 5. Drink more water 6. Test blood sugars in am daily. 7. Get A1C less than 7% in three months

## 2015-10-28 ENCOUNTER — Other Ambulatory Visit: Payer: Self-pay | Admitting: Cardiovascular Disease

## 2015-10-31 ENCOUNTER — Other Ambulatory Visit (HOSPITAL_COMMUNITY): Payer: Self-pay | Admitting: Family Medicine

## 2015-10-31 DIAGNOSIS — Z1231 Encounter for screening mammogram for malignant neoplasm of breast: Secondary | ICD-10-CM

## 2015-11-04 ENCOUNTER — Ambulatory Visit (HOSPITAL_COMMUNITY)
Admission: RE | Admit: 2015-11-04 | Discharge: 2015-11-04 | Disposition: A | Discharge status: Home / Self Care | Payer: 59 | Source: Ambulatory Visit | Attending: Family Medicine | Admitting: Family Medicine

## 2015-11-04 DIAGNOSIS — Z1231 Encounter for screening mammogram for malignant neoplasm of breast: Secondary | ICD-10-CM

## 2015-11-18 ENCOUNTER — Encounter: Payer: 59 | Attending: Family Medicine | Admitting: Nutrition

## 2015-11-18 DIAGNOSIS — R079 Chest pain, unspecified: Secondary | ICD-10-CM | POA: Insufficient documentation

## 2015-11-18 DIAGNOSIS — Z713 Dietary counseling and surveillance: Secondary | ICD-10-CM | POA: Diagnosis present

## 2015-11-18 DIAGNOSIS — E119 Type 2 diabetes mellitus without complications: Secondary | ICD-10-CM | POA: Diagnosis not present

## 2015-11-18 DIAGNOSIS — E118 Type 2 diabetes mellitus with unspecified complications: Secondary | ICD-10-CM

## 2015-11-18 NOTE — Progress Notes (Signed)
  Medical Nutrition Therapy:  Appt start time: 1330 end time:  1430.  Assessment:  Primary concerns today: Diabetes Type 2. BS log brought in. FBS 151-165 mg/dl. Her BS are coming down from in the 180's fasting in last 2 weeks. Changes made: Eating breakfast daily most of the time. Still eating sugared cereals.   Eating more fruits and nuts. Avoiding before 7 pm. Eating more grilled chicken.  1500 mg of Metformin now.  She lost 1 lb since last visit. Making slow progress. Admits she needs to increase physical activity. Diet remains excessive to meet her needs and  Limiting her weigh  Preferred Learning Style:    No preference indicated   Learning Readiness:     Ready  Change in progress   MEDICATIONS:see list   DIETARY INTAKE:  Breakfast:   Cinnamon flosted flakes, with milk skim.  Lunch: Grilled Chicken salad from [place], [place], water Snack: grapes Dinner: Cinnamon frosted flakes with skim, or special,   Usual physical activity: little watking  Estimated energy needs: 1500 calories 170 g carbohydrates 112 g protein 42 g fat  Progress Towards Goal(s):  In progress.   Nutritional Diagnosis:  NB-1.1 Food and nutrition-related knowledge deficit As related to DM.  As evidenced by A1C 7.7%..    Intervention:  Nutrition and Diabetes education provided on My Plate, CHO counting, meal planning, portion sizes, timing of meals, avoiding snacks between meals unless having a low blood sugar, target ranges for A1C and blood sugars, signs/symptoms and treatment of hyper/hypoglycemia, monitoring blood sugars, taking medications as prescribed, benefits of exercising 30 minutes per day and prevention of complications of DM. Marland KitchenGoals 1. Follow Plate Method 2. Eat better balanced breakfast 3. Eat 2-3 carb choices per meal 4. Increase lose carb vegetables. 5. Drink more water 6. Test blood sugars in am daily. 7. Get A1C less than 7% in three months  Talk to MD about going up to 2000 mg  per day of Metformin to get FBS less than 130.  Teaching Method Utilized:  Visual Auditory Hands on  Handouts given during visit include:  The Plate Method   Meal Plan Card  DM  Barriers to learning/adherence to lifestyle change: None  Demonstrated degree of understanding via:  Teach Back   Monitoring/Evaluation:  Dietary intake, exercise, meal planning, SBG, and body weight in 3 month(s).    Recommend to increase Metformin to 2000 mg /d to get FBS less than 130 mg/dl.

## 2015-11-18 NOTE — Patient Instructions (Addendum)
Goals 1. Cut out sugared cereals. 2. Plan meals using grocery store flyers 3. Drink water 6 bottles 4. Increase low carb vegetables 2 per meals 5. Eat fruit with meal and not between meals. Continue 15 minutes 4 times per week. Lose 1-2 lbs per week Talk to Dr. Scotty Court office ask to increase Meformin to 2000 mg per day; 2 pills in am and 2 in pm. Get A1C down  to 6%.

## 2015-11-23 ENCOUNTER — Encounter: Payer: Self-pay | Admitting: Nutrition

## 2015-12-01 ENCOUNTER — Encounter: Payer: Self-pay | Admitting: Cardiovascular Disease

## 2015-12-01 ENCOUNTER — Ambulatory Visit (INDEPENDENT_AMBULATORY_CARE_PROVIDER_SITE_OTHER): Payer: 59 | Admitting: Cardiovascular Disease

## 2015-12-01 VITALS — BP 104/64 | HR 77 | Ht 62.0 in | Wt 251.0 lb

## 2015-12-01 DIAGNOSIS — Z87898 Personal history of other specified conditions: Secondary | ICD-10-CM

## 2015-12-01 DIAGNOSIS — I4891 Unspecified atrial fibrillation: Secondary | ICD-10-CM

## 2015-12-01 DIAGNOSIS — E785 Hyperlipidemia, unspecified: Secondary | ICD-10-CM

## 2015-12-01 DIAGNOSIS — Z9289 Personal history of other medical treatment: Secondary | ICD-10-CM

## 2015-12-01 DIAGNOSIS — I10 Essential (primary) hypertension: Secondary | ICD-10-CM

## 2015-12-01 NOTE — Patient Instructions (Signed)
Your physician wants you to follow-up in: 1 year Dr Koneswaran You will receive a reminder letter in the mail two months in advance. If you don't receive a letter, please call our office to schedule the follow-up appointment.     Your physician recommends that you continue on your current medications as directed. Please refer to the Current Medication list given to you today.    If you need a refill on your cardiac medications before your next appointment, please call your pharmacy.      Thank you for choosing [facility] !         

## 2015-12-01 NOTE — Progress Notes (Signed)
SUBJECTIVE: The patient is here to followup for atrial fibrillation, for which she takes long-acting diltiazem, metoprolol and apixaban. She also has a history of hyperthyroidism and morbid obesity. She has been doing well and denies chest pain and shortness of breath.  Echocardiogram 10/12/15 showed vigorous left ventricular systolic function, LVEF [id number], mild left atrial dilatation.  Hospitalized for atypical chest pain in August 2017 with normal troponins.  Since then she has been doing well and denies chest pain, palpitations, shortness of breath, and dizziness.  Review of Systems: As per "subjective", otherwise negative.  No Known Allergies  Current Outpatient Prescriptions  Medication Sig Dispense Refill  . digoxin (LANOXIN) 0.25 MG tablet Take 1 tablet (0.25 mg total) by mouth daily. 90 tablet 3  . diltiazem (CARDIZEM CD) 120 MG 24 hr capsule Take 1 capsule (120 mg total) by mouth 2 (two) times daily. 60 capsule 4  . ELIQUIS 5 MG TABS tablet TAKE ONE TABLET BY MOUTH TWICE DAILY 60 tablet 0  . metFORMIN (GLUCOPHAGE) 500 MG tablet Take 1,000 mg by mouth 2 (two) times daily with a meal.    . metoprolol (LOPRESSOR) 50 MG tablet TAKE ONE TABLET BY MOUTH TWICE DAILY 60 tablet 0  . pravastatin (PRAVACHOL) 40 MG tablet Take 40 mg by mouth at bedtime.    . progesterone (PROMETRIUM) 200 MG capsule Take 1 tablet nightly (Patient taking differently: Take 200 mg by mouth at bedtime. Take 1 tablet nightly for the 1st 10 days of each month) 30 capsule 11   No current facility-administered medications for this visit.     Past Medical History:  Diagnosis Date  . Atrial fibrillation with RVR ([place]) 06/2013  . Breast cancer ([place]) 2007   left  . Diabetes mellitus    Type II  . Hematuria 01/20/2014  . Hypertension   . Hyperthyroidism 06/2013  . Obesity   . PMB (postmenopausal bleeding) 07/24/2012   Had 16.1 mm endometrium on [place] will get endo biopsy  . Psoriasis   . Urinary frequency  01/20/2014    Past Surgical History:  Procedure Laterality Date  . BREAST SURGERY  02/20/06   left side   . CESAREAN SECTION  1995  . HYSTEROSCOPY W/D&C N/A 08/13/2012   Procedure: DILATATION AND CURETTAGE /HYSTEROSCOPY;  Surgeon: Florian Buff, MD;  Location: [facility];  Service: Gynecology;  Laterality: N/A;    Social History   Social History  . Marital status: Married    Spouse name: N/A  . Number of children: N/A  . Years of education: N/A   Occupational History  . Not on file.   Social History Main Topics  . Smoking status: Former Smoker    Packs/day: 0.01    Years: 1.00    Types: Cigarettes    Start date: 03/05/1976    Quit date: 03/06/2003  . Smokeless tobacco: Never Used  . Alcohol use No  . Drug use: No  . Sexual activity: No   Other Topics Concern  . Not on file   Social History Narrative  . No narrative on file     Vitals:   12/01/15 1518  BP: 104/64  Pulse: 77  SpO2: 99%  Weight: 251 lb (113.9 kg)  Height: 5\' 2"  (1.575 m)    PHYSICAL EXAM General: NAD, obese. HEENT: Normal. Neck: No JVD, no thyromegaly. Lungs: Clear to auscultation bilaterally with normal respiratory effort. CV: Normal rate, irregular rhythm, normal S1/S2, no S3, no murmur. No pretibial or periankle  edema.No carotid bruits. Abdomen: Soft, obese. Neurologic: Alert and oriented x 3.  Psych: Normal affect. Skin: Normal. Musculoskeletal: No gross deformities. Extremities: No clubbing or cyanosis.     ECG: Most recent ECG reviewed.      ASSESSMENT AND PLAN: 1. Atrial fibrillation: Stable on digoxin, diltiazem, metoprolol, and Eliquis. No changes.  2. HTN: Controlled. No changes.  3. Hyperlipidemia: Continue statin.  Dispo: fu 1 year.   Kate Sable, M.D., F.A.C.C.

## 2015-12-02 ENCOUNTER — Other Ambulatory Visit: Payer: Self-pay | Admitting: Cardiovascular Disease

## 2016-01-09 ENCOUNTER — Ambulatory Visit: Payer: 59 | Admitting: Nutrition

## 2016-05-14 ENCOUNTER — Other Ambulatory Visit: Payer: Self-pay | Admitting: Cardiovascular Disease

## 2016-09-24 ENCOUNTER — Other Ambulatory Visit (HOSPITAL_COMMUNITY): Payer: Self-pay | Admitting: Family Medicine

## 2016-09-24 DIAGNOSIS — Z1231 Encounter for screening mammogram for malignant neoplasm of breast: Secondary | ICD-10-CM

## 2016-11-07 ENCOUNTER — Ambulatory Visit (HOSPITAL_COMMUNITY)
Admission: RE | Admit: 2016-11-07 | Discharge: 2016-11-07 | Disposition: A | Discharge status: Home / Self Care | Payer: 59 | Source: Ambulatory Visit | Attending: Family Medicine | Admitting: Family Medicine

## 2016-11-07 DIAGNOSIS — Z1231 Encounter for screening mammogram for malignant neoplasm of breast: Secondary | ICD-10-CM | POA: Insufficient documentation

## 2016-11-30 ENCOUNTER — Encounter: Payer: Self-pay | Admitting: Cardiovascular Disease

## 2016-11-30 ENCOUNTER — Ambulatory Visit (INDEPENDENT_AMBULATORY_CARE_PROVIDER_SITE_OTHER): Payer: 59 | Admitting: Cardiovascular Disease

## 2016-11-30 VITALS — BP 98/60 | HR 60 | Ht 62.0 in | Wt 267.8 lb

## 2016-11-30 DIAGNOSIS — E7800 Pure hypercholesterolemia, unspecified: Secondary | ICD-10-CM | POA: Diagnosis not present

## 2016-11-30 DIAGNOSIS — I482 Chronic atrial fibrillation, unspecified: Secondary | ICD-10-CM

## 2016-11-30 DIAGNOSIS — I10 Essential (primary) hypertension: Secondary | ICD-10-CM | POA: Diagnosis not present

## 2016-11-30 MED ORDER — DIGOXIN 125 MCG PO TABS: 0.1250 mg | ORAL_TABLET | Freq: Every day | ORAL | 3 refills | Status: DC

## 2016-11-30 NOTE — Progress Notes (Signed)
SUBJECTIVE: The patient is here to followup for atrial fibrillation, for which she takes long-acting diltiazem, metoprolol and apixaban. She also has a history of hyperthyroidism and morbid obesity.  Echocardiogram 10/12/15 showed vigorous left ventricular systolic function, LVEF 62-83%, mild left atrial dilatation.  It has been a difficult year for her. Her husband passed away this past Jun 04, 2022. She does have a 22-month-old grandson who keeps her active.  She denies palpitations and shortness of breath. She had one episode of chest pain about a month ago which resolved on its own.  She apparently had labs including a digoxin level checked by her PCP within the past few weeks. I will try and obtain a copy of these for personal review.  ECG performed in the office today which I ordered and personally interpreted demonstrated slow atrial fibrillation, 56 bpm, with late R-wave transition.   Review of Systems: As per "subjective", otherwise negative.  No Known Allergies  Current Outpatient Prescriptions  Medication Sig Dispense Refill  . CARTIA XT 120 MG 24 hr capsule TAKE ONE CAPSULE BY MOUTH TWICE DAILY 180 capsule 3  . digoxin (LANOXIN) 0.25 MG tablet TAKE ONE TABLET BY MOUTH ONCE DAILY 30 tablet 11  . ELIQUIS 5 MG TABS tablet TAKE ONE TABLET BY MOUTH TWICE DAILY 180 tablet 3  . metFORMIN (GLUCOPHAGE) 500 MG tablet Take 1,000 mg by mouth 2 (two) times daily with a meal.    . metoprolol (LOPRESSOR) 50 MG tablet TAKE ONE TABLET BY MOUTH TWICE DAILY **NEEDS APPOINTMENT FOR FURTHER REFILLS** 180 tablet 3  . pravastatin (PRAVACHOL) 40 MG tablet Take 40 mg by mouth at bedtime.     No current facility-administered medications for this visit.     Past Medical History:  Diagnosis Date  . Atrial fibrillation with RVR ([place]) 06/2013  . Breast cancer ([place]) 06/03/2005   left  . Diabetes mellitus    Type II  . Hematuria 01/20/2014  . Hypertension   . Hyperthyroidism 06/2013  . Obesity   . PMB  (postmenopausal bleeding) 07/24/2012   Had 16.1 mm endometrium on [place] will get endo biopsy  . Psoriasis   . Urinary frequency 01/20/2014    Past Surgical History:  Procedure Laterality Date  . BREAST SURGERY  02/20/06   left side   . CESAREAN SECTION  1995  . HYSTEROSCOPY W/D&C N/A 08/13/2012   Procedure: DILATATION AND CURETTAGE /HYSTEROSCOPY;  Surgeon: Florian Buff, MD;  Location: [facility];  Service: Gynecology;  Laterality: N/A;    Social History   Social History  . Marital status: Married    Spouse name: N/A  . Number of children: N/A  . Years of education: N/A   Occupational History  . Not on file.   Social History Main Topics  . Smoking status: Former Smoker    Packs/day: 0.01    Years: 1.00    Types: Cigarettes    Start date: 03/05/1976    Quit date: 03/06/2003  . Smokeless tobacco: Never Used  . Alcohol use No  . Drug use: No  . Sexual activity: No   Other Topics Concern  . Not on file   Social History Narrative  . No narrative on file     Vitals:   11/30/16 0814  BP: 98/60  Pulse: 60  SpO2: 97%  Weight: 267 lb 12.8 oz (121.5 kg)  Height: 5\' 2"  (1.575 m)    Wt Readings from Last 3 Encounters:  11/30/16 267 lb 12.8 oz (  121.5 kg)  12/01/15 251 lb (113.9 kg)  11/18/15 263 lb (119.3 kg)     PHYSICAL EXAM General: NAD HEENT: Normal. Neck: No JVD, no thyromegaly. Lungs: Clear to auscultation bilaterally with normal respiratory effort. CV: Nondisplaced PMI.  Bradycardic, irregular rhythm, normal S1/S2, no S3, no murmur. Trivial periankle edema.  No carotid bruit.   Abdomen: Soft, nontender, protuberant. Neurologic: Alert and oriented.  Psych: Normal affect. Skin: Normal. Musculoskeletal: No gross deformities.    ECG: Most recent ECG reviewed.   Labs: Lab Results  Component Value Date/Time   K 3.9 10/13/2015 05:39 AM   BUN 11 10/13/2015 05:39 AM   CREATININE 0.93 10/13/2015 05:39 AM   ALT 25 08/08/2012 03:00 PM   TSH 2.226 10/12/2015 02:44  AM   TSH 0.011 (L) 06/26/2013 02:33 PM   HGB 11.6 (L) 10/13/2015 05:39 AM     Lipids: Lab Results  Component Value Date/Time   LDLCALC 103 (H) 10/12/2015 02:44 AM   CHOL 171 10/12/2015 02:44 AM   TRIG 109 10/12/2015 02:44 AM   HDL 46 10/12/2015 02:44 AM       ASSESSMENT AND PLAN: 1. Atrial fibrillation: Symptomatically stable. I will reduce digoxin to 0.125 mg daily. I will obtain copies of most recent labs including digoxin level. Otherwise, continue diltiazem, metoprolol, and Eliquis.  2. HTN: Controlled. No changes.  3. Hyperlipidemia: Continue pravastatin.      Disposition: Follow up 1 year   Kate Sable, M.D., F.A.C.C.

## 2016-11-30 NOTE — Patient Instructions (Signed)
Medication Instructions:  Decrease Digoxin to 0.125 mg daily   Labwork: I will request a copy of labs from Dr. Scotty Court  Testing/Procedures: none  Follow-Up: Your physician wants you to follow-up in: 1 year.  You will receive a reminder letter in the mail two months in advance. If you don't receive a letter, please call our office to schedule the follow-up appointment. c  Any Other Special Instructions Will Be Listed Below (If Applicable).     If you need a refill on your cardiac medications before your next appointment, please call your pharmacy.

## 2016-12-24 ENCOUNTER — Other Ambulatory Visit: Payer: Self-pay | Admitting: Cardiovascular Disease

## 2017-03-13 IMAGING — MG 2D DIGITAL SCREENING BILATERAL MAMMOGRAM WITH CAD AND ADJUNCT TO
6 of 9 series · 6 of 25 positions shown · non-contrast
Comparison: Previous exam(s).

CLINICAL DATA: Screening.

EXAM:
2D DIGITAL SCREENING BILATERAL MAMMOGRAM WITH CAD AND ADJUNCT TOMO

[L MLO (1 of 2)]
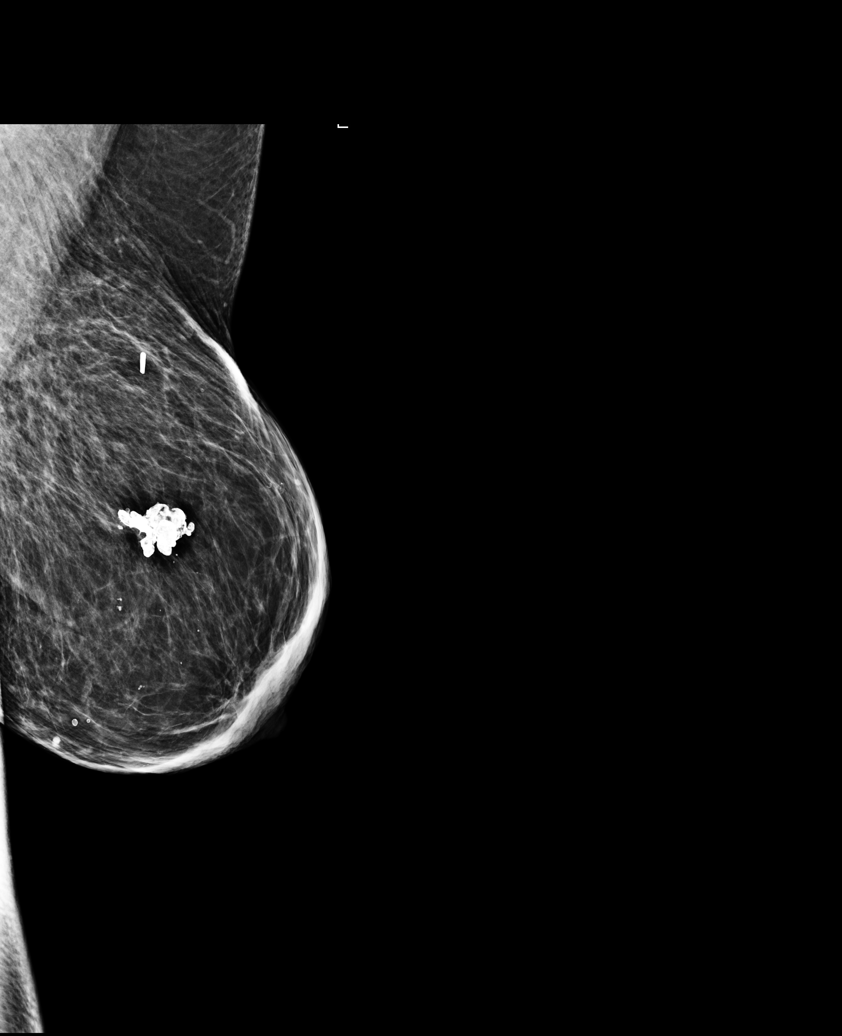

[L CC]
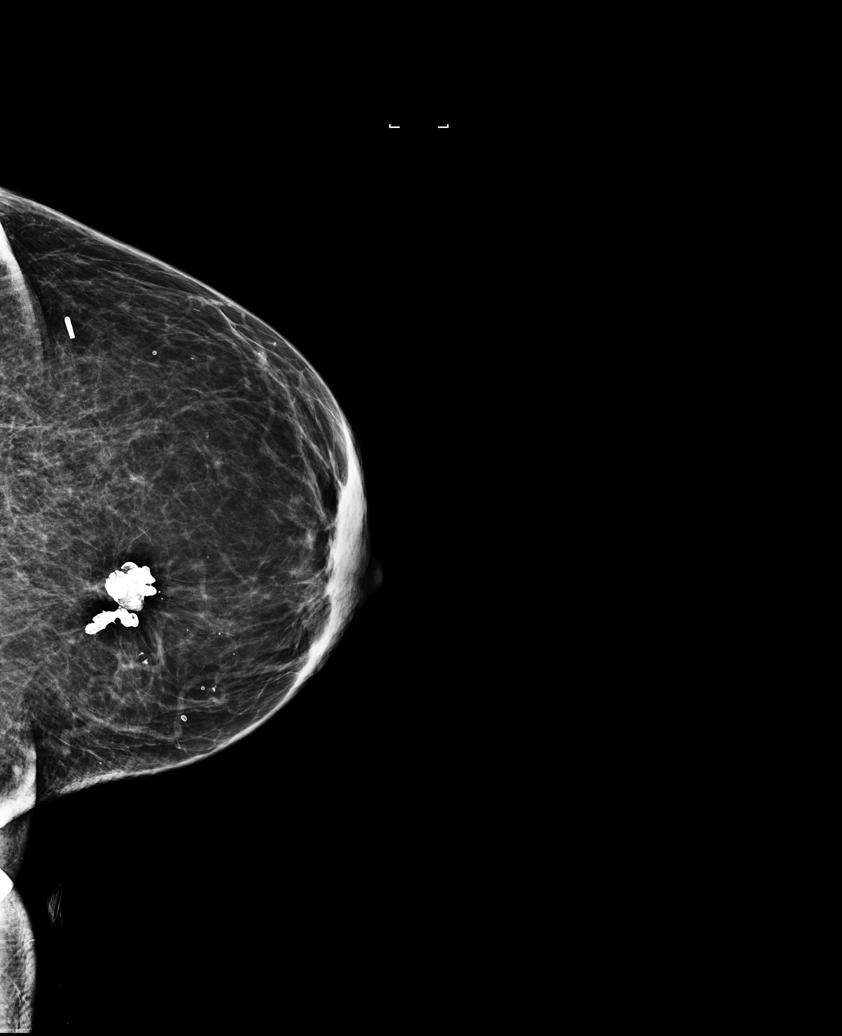

[L MLO (2 of 2)]
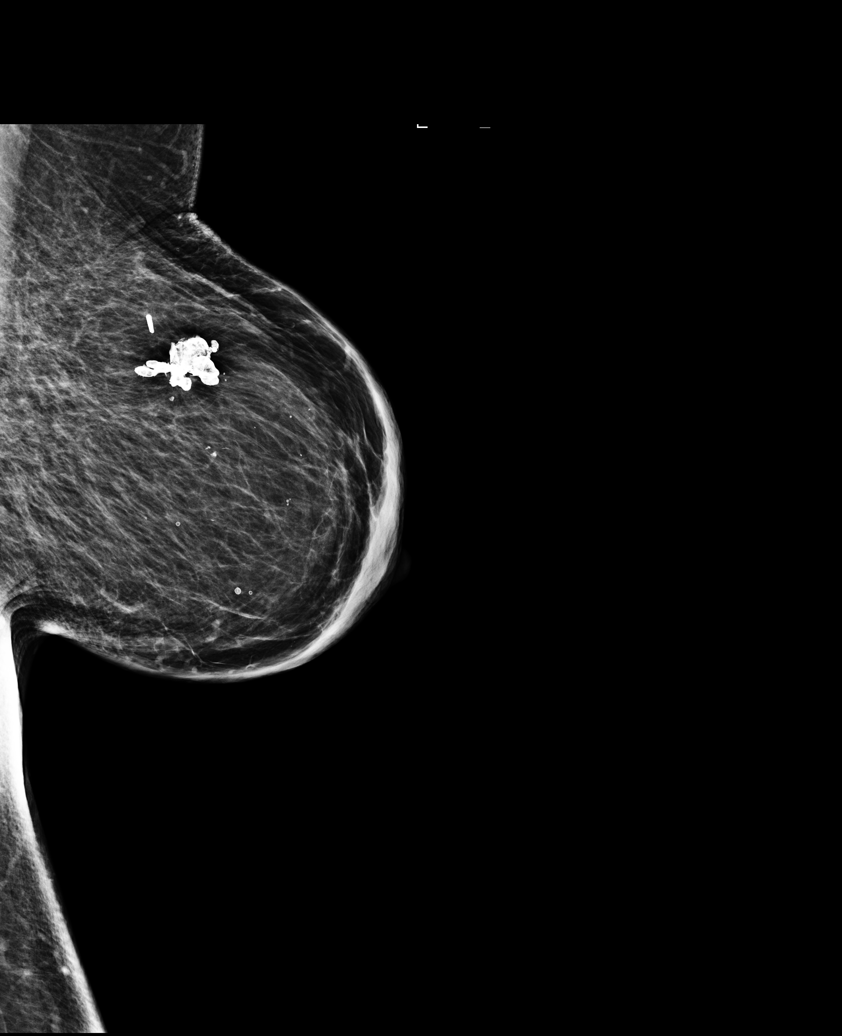

[R MLO]
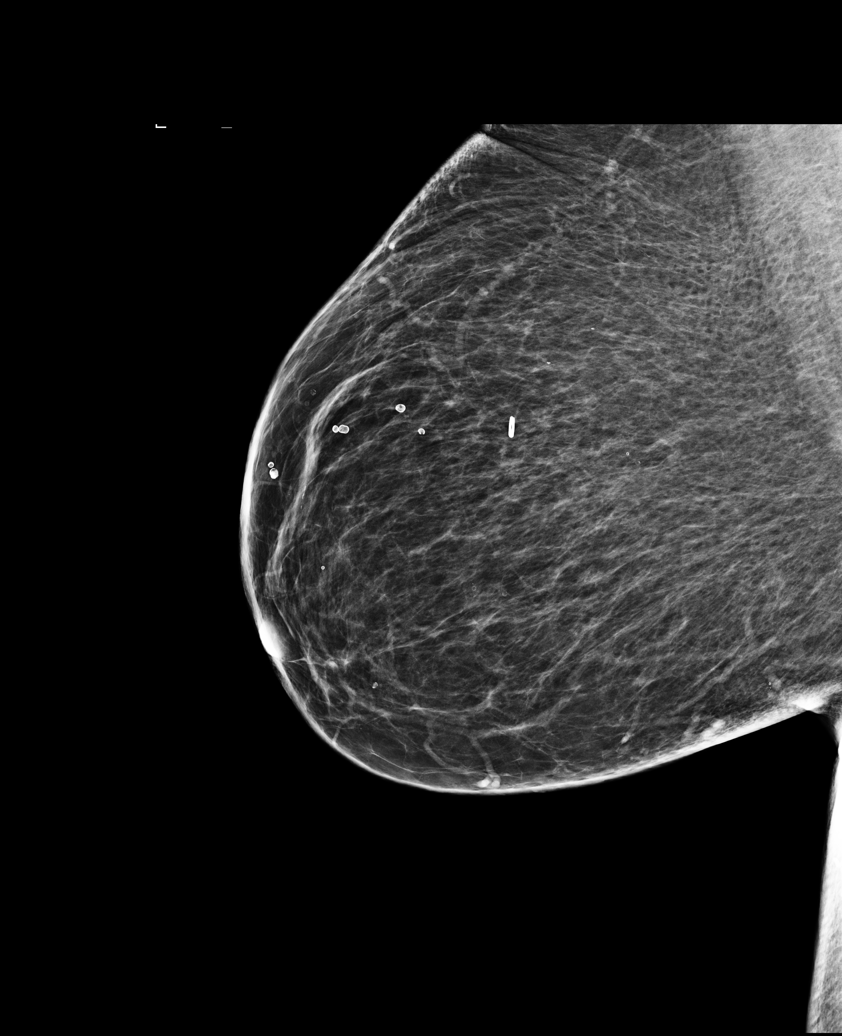

[R CC]
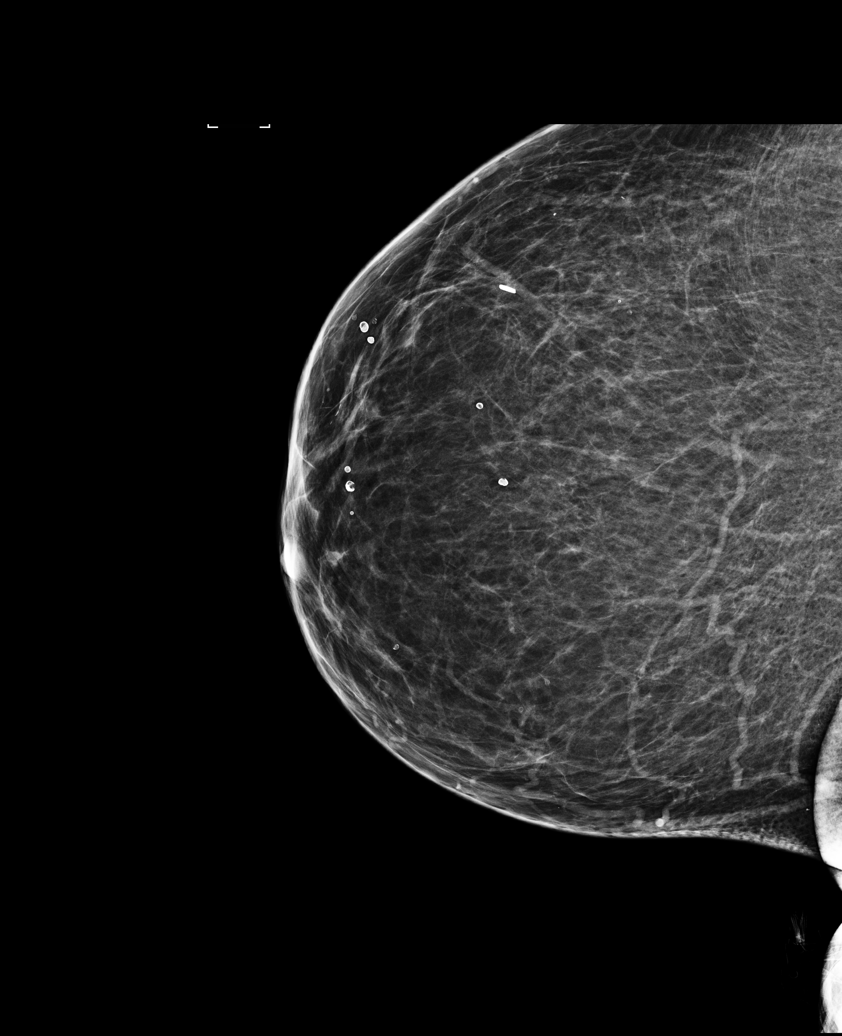

[R CC tomo · tomo slice 33/65.0]
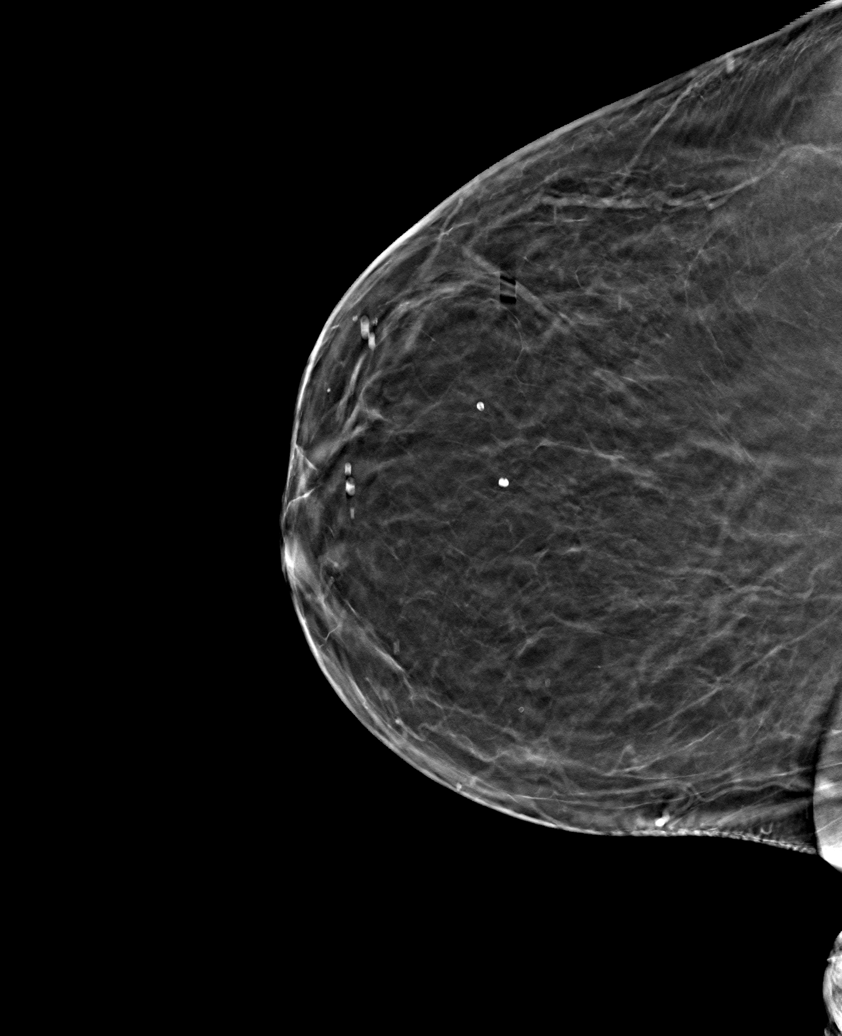

[6 of 25 positions shown; findings below may reference images not displayed]

ACR Breast Density Category b: There are scattered areas of
fibroglandular density.
FINDINGS: There are no findings suspicious for malignancy. Images were
processed with CAD.
IMPRESSION: No mammographic evidence of malignancy. A result letter of this
screening mammogram will be mailed directly to the patient.

RECOMMENDATION:
Screening mammogram in one year. (Code:97-6-RS4)

BI-RADS CATEGORY  1: Negative.

## 2017-05-25 ENCOUNTER — Other Ambulatory Visit: Payer: Self-pay | Admitting: Cardiovascular Disease

## 2017-05-27 ENCOUNTER — Other Ambulatory Visit: Payer: Self-pay

## 2017-05-27 MED ORDER — DIGOXIN 125 MCG PO TABS: 0.1250 mg | ORAL_TABLET | Freq: Every day | ORAL | 3 refills | Status: DC

## 2017-05-27 NOTE — Telephone Encounter (Signed)
Refilled lanoxin 0.125 mg daily to [facility]

## 2017-10-09 ENCOUNTER — Other Ambulatory Visit (HOSPITAL_COMMUNITY): Payer: Self-pay | Admitting: Family Medicine

## 2017-10-09 DIAGNOSIS — Z1231 Encounter for screening mammogram for malignant neoplasm of breast: Secondary | ICD-10-CM

## 2017-11-14 ENCOUNTER — Ambulatory Visit (HOSPITAL_COMMUNITY)
Admission: RE | Admit: 2017-11-14 | Discharge: 2017-11-14 | Disposition: A | Discharge status: Home / Self Care | Payer: BLUE CROSS/BLUE SHIELD | Source: Ambulatory Visit | Attending: Family Medicine | Admitting: Family Medicine

## 2017-11-14 DIAGNOSIS — Z1231 Encounter for screening mammogram for malignant neoplasm of breast: Secondary | ICD-10-CM | POA: Diagnosis present

## 2017-12-17 ENCOUNTER — Ambulatory Visit: Payer: BLUE CROSS/BLUE SHIELD | Admitting: Adult Health

## 2017-12-17 ENCOUNTER — Other Ambulatory Visit (HOSPITAL_COMMUNITY)
Admission: RE | Admit: 2017-12-17 | Discharge: 2017-12-17 | Disposition: A | Discharge status: Home / Self Care | Payer: BLUE CROSS/BLUE SHIELD | Source: Ambulatory Visit | Attending: Adult Health | Admitting: Adult Health

## 2017-12-17 ENCOUNTER — Encounter: Payer: Self-pay | Admitting: Adult Health

## 2017-12-17 VITALS — BP 117/74 | HR 60 | Ht 62.0 in | Wt 203.0 lb

## 2017-12-17 DIAGNOSIS — Z01419 Encounter for gynecological examination (general) (routine) without abnormal findings: Secondary | ICD-10-CM | POA: Diagnosis present

## 2017-12-17 DIAGNOSIS — Z1211 Encounter for screening for malignant neoplasm of colon: Secondary | ICD-10-CM | POA: Diagnosis not present

## 2017-12-17 DIAGNOSIS — Z1212 Encounter for screening for malignant neoplasm of rectum: Secondary | ICD-10-CM | POA: Diagnosis not present

## 2017-12-17 DIAGNOSIS — Z853 Personal history of malignant neoplasm of breast: Secondary | ICD-10-CM

## 2017-12-17 DIAGNOSIS — R195 Other fecal abnormalities: Secondary | ICD-10-CM | POA: Diagnosis not present

## 2017-12-17 LAB — HEMOCCULT GUIAC POC 1CARD (OFFICE): FECAL OCCULT BLD: POSITIVE — AB

## 2017-12-17 NOTE — Progress Notes (Signed)
Patient ID: Kaitlyn Hull, female   DOB: [birth year], 62 y.o.   MRN: [id number] History of Present Illness: Kaitlyn Hull is a 62 year old white female, widowed, PM in for well woman gyn exam and pap. PCP is Delight Ovens, FNP.   Current Medications, Allergies, Past Medical History, Past Surgical History, Family History and Social History were reviewed in [employer] record.     Review of Systems: Patient denies any headaches, hearing loss, fatigue, blurred vision, shortness of breath, chest pain, abdominal pain, problems with bowel movements, urination, or intercourse(not sexually active). No joint pain or mood swings.    Physical Exam:BP 117/74 (BP Location: Left Arm, Patient Position: Sitting, Cuff Size: Large)   Pulse 60   Ht 5\' 2"  (1.575 m)   Wt 203 lb (92.1 kg)   LMP 11/17/2014   BMI 37.13 kg/m Has lost 71 lbs since January 2019.  General:  Well developed, well nourished, no acute distress Skin:  Warm and dry Neck:  Midline trachea, normal thyroid, good ROM, no lymphadenopathy Lungs; Clear to auscultation bilaterally Breast:  No dominant palpable mass, retraction, or nipple discharge,L<R from radiation and sugery Cardiovascular: Regular rate and rhythm Abdomen:  Soft, non tender, no hepatosplenomegaly Pelvic:  External genitalia is normal in appearance, no lesions.  The vagina is normal in appearance. Urethra has no lesions or masses. The cervix is smooth, pap with HPV performed.  Uterus is felt to be normal size, shape, and contour.  No adnexal masses or tenderness noted.Bladder is non tender, no masses felt. Rectal: Good sphincter tone, no polyps, or hemorrhoids felt.  Hemoccult positive. . Extremities/musculoskeletal:  No swelling or varicosities noted, no clubbing or cyanosis Psych:  No mood changes, alert and cooperative,seems happy PHQ 2 score 0.    Impression: 1. Encounter for gynecological examination with Papanicolaou smear of cervix   2. Screening  for colorectal cancer   3. Fecal occult blood test positive   4. History of breast cancer       Plan: 3 hemoccult cards home Physical in 1 year Pap in 3 years if normal Referred to Dr Gala Romney Mammogram yearly Labs with PCP

## 2017-12-19 ENCOUNTER — Encounter: Payer: Self-pay | Admitting: Internal Medicine

## 2017-12-20 LAB — CYTOLOGY - PAP
Diagnosis: NEGATIVE
HPV: NOT DETECTED

## 2017-12-23 ENCOUNTER — Other Ambulatory Visit: Payer: Self-pay | Admitting: Cardiovascular Disease

## 2017-12-23 ENCOUNTER — Other Ambulatory Visit (INDEPENDENT_AMBULATORY_CARE_PROVIDER_SITE_OTHER): Payer: BLUE CROSS/BLUE SHIELD

## 2017-12-23 DIAGNOSIS — Z1211 Encounter for screening for malignant neoplasm of colon: Secondary | ICD-10-CM

## 2017-12-23 DIAGNOSIS — Z1212 Encounter for screening for malignant neoplasm of rectum: Secondary | ICD-10-CM | POA: Diagnosis not present

## 2017-12-23 LAB — HEMOCCULT GUIAC POC 1CARD (OFFICE)
FECAL OCCULT BLD: NEGATIVE
FECAL OCCULT BLD: NEGATIVE
Fecal Occult Blood, POC: NEGATIVE

## 2017-12-23 NOTE — Progress Notes (Signed)
Pt brought hemocult cards by office. All 3 cards were normal per JAG. Pt aware. [place]

## 2018-01-20 ENCOUNTER — Ambulatory Visit: Payer: BLUE CROSS/BLUE SHIELD | Admitting: Cardiovascular Disease

## 2018-01-26 ENCOUNTER — Other Ambulatory Visit: Payer: Self-pay | Admitting: Cardiology

## 2018-02-24 ENCOUNTER — Other Ambulatory Visit: Payer: Self-pay | Admitting: Cardiology

## 2018-03-10 ENCOUNTER — Other Ambulatory Visit: Payer: Self-pay

## 2018-03-10 ENCOUNTER — Ambulatory Visit: Payer: BLUE CROSS/BLUE SHIELD | Admitting: Nurse Practitioner

## 2018-03-10 ENCOUNTER — Encounter: Payer: Self-pay | Admitting: Nurse Practitioner

## 2018-03-10 ENCOUNTER — Telehealth: Payer: Self-pay

## 2018-03-10 VITALS — BP 116/77 | HR 60 | Temp 97.0°F | Ht 63.0 in | Wt 199.4 lb

## 2018-03-10 DIAGNOSIS — R195 Other fecal abnormalities: Secondary | ICD-10-CM

## 2018-03-10 MED ORDER — NA SULFATE-K SULFATE-MG SULF 17.5-3.13-1.6 GM/177ML PO SOLN: 1.0000 | ORAL | 0 refills | Status: DC

## 2018-03-10 NOTE — Progress Notes (Signed)
CC'D TO PCP °

## 2018-03-10 NOTE — Patient Instructions (Signed)
1. We will schedule your colonoscopy for you. 2. We will make recommendations for holding your Eliquis after talking to your cardiologist. 3. We will make changes to your diabetes medicines for the day before your procedure in the morning of your procedure. 4. Further recommendations will be made after your colonoscopy. 5. Return for follow-up as needed for any GI problems. 6. Call us if you have any questions or concerns.  At [facility] we value your feedback. You may receive a survey about your visit today. Please share your experience as we strive to create trusting relationships with our patients to provide genuine, compassionate, quality care.  We appreciate your understanding and patience as we review any laboratory studies, imaging, and other diagnostic tests that are ordered as we care for you. Our office policy is 5 business days for review of these results, and any emergent or urgent results are addressed in a timely manner for your best interest. If you do not hear from our office in 1 week, please contact us.   We also encourage the use of MyChart, which contains your medical information for your review as well. If you are not enrolled in this feature, an access code is on this after visit summary for your convenience. Thank you for allowing [place] to be involved in your care.  It was great to see you today!  I hope you have a great start to the new year!!

## 2018-03-10 NOTE — Telephone Encounter (Signed)
FYI to Tretha Sciara and Walden Field, NP.

## 2018-03-10 NOTE — Telephone Encounter (Signed)
That would be fine, Doris.

## 2018-03-10 NOTE — Telephone Encounter (Signed)
Called and informed pt.  

## 2018-03-10 NOTE — Assessment & Plan Note (Signed)
The patient had positive fecal occult blood testing during her routine exam with gynecology.  Home cards were negative.  However, she is 63 years old and is never had a colonoscopy.  At this point we will proceed with colonoscopy.  Of note, she is on Eliquis due to atrial fibrillation currently managed by cardiology.  We will contact her cardiologist for clearance to hold her Eliquis for 48 hours prior to her procedure.  We will make adjustments to her diabetes medicines prior to procedure.  Return for follow-up as needed.  Proceed with TCS with Dr. Gala Romney in near future: the risks, benefits, and alternatives have been discussed with the patient in detail. The patient states understanding and desires to proceed.  The patient is currently on Eliquis.  No other anticoagulants, anxiolytics, chronic pain medications, or antidepressants.  Conscious sedation should be adequate for her procedure.

## 2018-03-10 NOTE — Progress Notes (Signed)
Primary Care Physician:  Deloria Lair., MD Primary Gastroenterologist:  Dr. Gala Romney  Chief Complaint  Patient presents with  . Consult    tcs. Never had one prior. + heme    HPI:   Kaitlyn Hull is a 63 y.o. female who presents on referral from primary care for positive fecal occult blood testing.  Reviewed information provided with the referral including office visit dated 12/17/2017 which was an encounter for routine gynecological exam.  During her exam a stool Hemoccult was completed and found to be positive.  She was sent home with stool heme cards x 3.  She was referred to GI.  Her home fecal occult blood test cards were negative x3.  No recent CBC.  No history of colonoscopy or endoscopy in our system.  Today she states she is doing well overall.  She has never had a colonoscopy before.  She denies any obvious hematochezia or melena. Denies abdominal pain, N/V, fever, chills, unintentional weight loss. Denies chest pain, dyspnea, dizziness, lightheadedness, syncope, near syncope. Denies any other upper or lower GI symptoms.  She does have AFib and is on Eliquis.  Past Medical History:  Diagnosis Date  . Atrial fibrillation with RVR ([place]) 06/2013  . Breast cancer ([place]) 2007   left  . Breast disorder    cancer  . Diabetes mellitus    Type II  . Hematuria 01/20/2014  . Hypertension   . Hyperthyroidism 06/2013  . Obesity   . PMB (postmenopausal bleeding) 07/24/2012   Had 16.1 mm endometrium on [place] will get endo biopsy  . Psoriasis   . Urinary frequency 01/20/2014    Past Surgical History:  Procedure Laterality Date  . BREAST SURGERY  02/20/06   left side   . CESAREAN SECTION  1995  . HYSTEROSCOPY W/D&C N/A 08/13/2012   Procedure: DILATATION AND CURETTAGE /HYSTEROSCOPY;  Surgeon: Florian Buff, MD;  Location: [facility];  Service: Gynecology;  Laterality: N/A;    Current Outpatient Medications  Medication Sig Dispense Refill  . CARTIA XT 120 MG 24 hr capsule TAKE 1  CAPSULE BY MOUTH TWICE DAILY 60 capsule 0  . digoxin (LANOXIN) 0.125 MG tablet Take 1 tablet (0.125 mg total) by mouth daily. 90 tablet 3  . ELIQUIS 5 MG TABS tablet TAKE 1 TABLET BY MOUTH TWICE DAILY 60 tablet 0  . metFORMIN (GLUCOPHAGE) 500 MG tablet Take 1,000 mg by mouth 2 (two) times daily with a meal.    . metoprolol tartrate (LOPRESSOR) 50 MG tablet TAKE 1 TABLET BY MOUTH TWICE DAILY 60 tablet 0  . pravastatin (PRAVACHOL) 40 MG tablet Take 40 mg by mouth at bedtime.     No current facility-administered medications for this visit.     Allergies as of 03/10/2018  . (No Known Allergies)    Family History  Problem Relation Age of Onset  . Heart failure Father   . CAD Father   . Diabetes Sister   . Other Sister        blood clots  . Breast cancer Sister   . Alzheimer's disease Mother   . Diabetes Sister   . Hypertension Maternal Grandmother   . Colon cancer Neg Hx     Social History   Socioeconomic History  . Marital status: Married    Spouse name: Not on file  . Number of children: Not on file  . Years of education: Not on file  . Highest education level: Not on file  Occupational  History  . Not on file  Social Needs  . Financial resource strain: Not on file  . Food insecurity:    Worry: Not on file    Inability: Not on file  . Transportation needs:    Medical: Not on file    Non-medical: Not on file  Tobacco Use  . Smoking status: Former Smoker    Packs/day: 0.01    Years: 1.00    Pack years: 0.01    Types: Cigarettes    Start date: 03/05/1976    Last attempt to quit: 03/06/2003    Years since quitting: 15.0  . Smokeless tobacco: Never Used  Substance and Sexual Activity  . Alcohol use: No    Alcohol/week: 0.0 standard drinks  . Drug use: No  . Sexual activity: Not Currently    Birth control/protection: Post-menopausal  Lifestyle  . Physical activity:    Days per week: Not on file    Minutes per session: Not on file  . Stress: Not on file    Relationships  . Social connections:    Talks on phone: Not on file    Gets together: Not on file    Attends religious service: Not on file    Active member of club or organization: Not on file    Attends meetings of clubs or organizations: Not on file    Relationship status: Not on file  . Intimate partner violence:    Fear of current or ex partner: Not on file    Emotionally abused: Not on file    Physically abused: Not on file    Forced sexual activity: Not on file  Other Topics Concern  . Not on file  Social History Narrative  . Not on file    Review of Systems: General: Negative for anorexia, weight loss, fever, chills, fatigue, weakness. ENT: Negative for hoarseness, difficulty swallowing. CV: Negative for chest pain, angina, palpitations, peripheral edema.  Respiratory: Negative for dyspnea at rest, cough, sputum, wheezing.  GI: See history of present illness. MS: Negative for joint pain, low back pain.  Derm: Negative for rash or itching.  Endo: Negative for unusual weight change.  Heme: Negative for bruising or bleeding. Allergy: Negative for rash or hives.    Physical Exam: BP 116/77   Pulse 60   Temp (!) 97 F (36.1 C) (Oral)   Ht 5\' 3"  (1.6 m)   Wt 199 lb 6.4 oz (90.4 kg)   LMP 11/17/2014   BMI 35.32 kg/m  General:   Alert and oriented. Pleasant and cooperative. Well-nourished and well-developed.  Head:  Normocephalic and atraumatic. Eyes:  Without icterus, sclera clear and conjunctiva pink.  Ears:  Normal auditory acuity. Cardiovascular:  S1, S2 present without murmurs appreciated. Normal bilateral DP pulses noted. Extremities without clubbing or edema. Respiratory:  Clear to auscultation bilaterally. No wheezes, rales, or rhonchi. No distress.  Gastrointestinal:  +BS, soft, non-tender and non-distended. No HSM noted. No guarding or rebound. No masses appreciated.  Rectal:  Deferred  Musculoskalatal:  Symmetrical without gross deformities. Skin:   Intact without significant lesions or rashes. Neurologic:  Alert and oriented x4;  grossly normal neurologically. Psych:  Alert and cooperative. Normal mood and affect. Heme/Lymph/Immune: No excessive bruising noted.    03/10/2018 10:48 AM   Disclaimer: This note was dictated with voice recognition software. Similar sounding words can inadvertently be transcribed and may not be corrected upon review.

## 2018-03-10 NOTE — Telephone Encounter (Signed)
Dr. Jacinta Shoe,   This mutual patient was seen in our office today by Walden Field, NP.  Randall Hiss would like to schedule him for a colonoscopy with Dr. Gala Romney in the very near future.   Please advise if it is OK to HOLD his Eliquis x 48 hours prior to procedure.   Thanks so much!   Everardo All, LPN

## 2018-03-13 NOTE — Telephone Encounter (Signed)
Pt already scheduled and aware.

## 2018-03-13 NOTE — Telephone Encounter (Signed)
Noted. Ok to proceed with final scheduling

## 2018-03-26 ENCOUNTER — Other Ambulatory Visit: Payer: Self-pay | Admitting: Cardiovascular Disease

## 2018-03-30 ENCOUNTER — Other Ambulatory Visit: Payer: Self-pay | Admitting: Cardiovascular Disease

## 2018-04-09 ENCOUNTER — Telehealth: Payer: Self-pay | Admitting: Internal Medicine

## 2018-04-09 NOTE — Telephone Encounter (Signed)
PATIENT CALLED AND WOULD LIKE TO CANCEL HER PROCEDURE

## 2018-04-09 NOTE — Telephone Encounter (Signed)
Spoke with patient and she just wanted to cancel her procedure. She did not want to r/s at this time. 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 Called endo and LMOVM to cancel. FYI to EG

## 2018-04-11 NOTE — Telephone Encounter (Signed)
Noted  

## 2018-04-14 ENCOUNTER — Ambulatory Visit: Payer: BLUE CROSS/BLUE SHIELD | Admitting: Cardiovascular Disease

## 2018-04-14 ENCOUNTER — Encounter: Payer: Self-pay | Admitting: Cardiovascular Disease

## 2018-04-14 VITALS — BP 105/72 | HR 64 | Ht 62.0 in | Wt 195.2 lb

## 2018-04-14 DIAGNOSIS — I4821 Permanent atrial fibrillation: Secondary | ICD-10-CM

## 2018-04-14 DIAGNOSIS — E7800 Pure hypercholesterolemia, unspecified: Secondary | ICD-10-CM

## 2018-04-14 DIAGNOSIS — I10 Essential (primary) hypertension: Secondary | ICD-10-CM

## 2018-04-14 NOTE — Progress Notes (Signed)
SUBJECTIVE:  The patient is here to followup for permanent atrial fibrillation, for which she takes long-acting diltiazem, digoxin, metoprolol and apixaban. She also has a history of hyperthyroidism and morbid obesity.  Echocardiogram 10/12/15 showed vigorous left ventricular systolic function, LVEF 81-10%, mild left atrial dilatation.  ECG performed in the office today which I ordered and personally interpreted demonstrates rate controlled atrial fibrillation.  The patient denies any symptoms of chest pain, palpitations, shortness of breath, lightheadedness, dizziness, leg swelling, orthopnea, PND, and syncope.  She is taking care of her 62 year old grandson today.  She has cut back on sugar and has lost 85 pounds by her scales over the last 2 years.  She said she had initially put on a lot of weight after her husband passed away.  Social history: Her husband of 45 years passed away in 06-13-2016 from COPD.  Her daughter lives in [place] and she has a sister in [place].  Review of Systems: As per "subjective", otherwise negative.  No Known Allergies  Current Outpatient Medications  Medication Sig Dispense Refill  . digoxin (LANOXIN) 0.25 MG tablet Take 0.25 mg by mouth daily.    Marland Kitchen diltiazem (CARDIZEM CD) 120 MG 24 hr capsule TAKE 1 CAPSULE BY MOUTH TWICE DAILY 60 capsule 0  . ELIQUIS 5 MG TABS tablet TAKE 1 TABLET BY MOUTH TWICE DAILY 60 tablet 0  . metFORMIN (GLUCOPHAGE-XR) 500 MG 24 hr tablet Take 2 tablets by mouth 2 (two) times daily.    . metoprolol tartrate (LOPRESSOR) 50 MG tablet TAKE 1 TABLET BY MOUTH TWICE DAILY 60 tablet 0  . Na Sulfate-K Sulfate-Mg Sulf (SUPREP BOWEL PREP KIT) 17.5-3.13-1.6 GM/177ML SOLN Take 1 kit by mouth as directed. 1 Bottle 0  . pravastatin (PRAVACHOL) 40 MG tablet Take 40 mg by mouth at bedtime.     No current facility-administered medications for this visit.     Past Medical History:  Diagnosis Date  . Atrial fibrillation with RVR  ([place]) 06/2013  . Breast cancer ([place]) 2007   left  . Breast disorder    cancer  . Diabetes mellitus    Type II  . Hematuria 01/20/2014  . Hypertension   . Hyperthyroidism 06/2013  . Obesity   . PMB (postmenopausal bleeding) 07/24/2012   Had 16.1 mm endometrium on [place] will get endo biopsy  . Psoriasis   . Urinary frequency 01/20/2014    Past Surgical History:  Procedure Laterality Date  . BREAST SURGERY  02/20/06   left side   . CESAREAN SECTION  1995  . HYSTEROSCOPY W/D&C N/A 08/13/2012   Procedure: DILATATION AND CURETTAGE /HYSTEROSCOPY;  Surgeon: Florian Buff, MD;  Location: [facility];  Service: Gynecology;  Laterality: N/A;    Social History   Socioeconomic History  . Marital status: Married    Spouse name: Not on file  . Number of children: Not on file  . Years of education: Not on file  . Highest education level: Not on file  Occupational History  . Not on file  Social Needs  . Financial resource strain: Not on file  . Food insecurity:    Worry: Not on file    Inability: Not on file  . Transportation needs:    Medical: Not on file    Non-medical: Not on file  Tobacco Use  . Smoking status: Former Smoker    Packs/day: 0.01    Years: 1.00    Pack years: 0.01  Types: Cigarettes    Start date: 03/05/1976    Last attempt to quit: 03/06/2003    Years since quitting: 15.1  . Smokeless tobacco: Never Used  Substance and Sexual Activity  . Alcohol use: No    Alcohol/week: 0.0 standard drinks  . Drug use: No  . Sexual activity: Not Currently    Birth control/protection: Post-menopausal  Lifestyle  . Physical activity:    Days per week: Not on file    Minutes per session: Not on file  . Stress: Not on file  Relationships  . Social connections:    Talks on phone: Not on file    Gets together: Not on file    Attends religious service: Not on file    Active member of club or organization: Not on file    Attends meetings of clubs or organizations: Not on file     Relationship status: Not on file  . Intimate partner violence:    Fear of current or ex partner: Not on file    Emotionally abused: Not on file    Physically abused: Not on file    Forced sexual activity: Not on file  Other Topics Concern  . Not on file  Social History Narrative  . Not on file     Vitals:   04/14/18 0947  BP: 105/72  Pulse: 64  Weight: 195 lb 3.2 oz (88.5 kg)  Height: 5' 2"  (1.575 m)    Wt Readings from Last 3 Encounters:  04/14/18 195 lb 3.2 oz (88.5 kg)  03/10/18 199 lb 6.4 oz (90.4 kg)  12/17/17 203 lb (92.1 kg)     PHYSICAL EXAM General: NAD HEENT: Normal. Neck: No JVD, no thyromegaly. Lungs: Clear to auscultation bilaterally with normal respiratory effort. CV: Regular rate and irregular rhythm, normal S1/S2, no S3, no murmur. No pretibial or periankle edema.  No carotid bruit.   Abdomen: Soft, nontender, no distention.  Neurologic: Alert and oriented.  Psych: Normal affect. Skin: Normal. Musculoskeletal: No gross deformities.    ECG: Reviewed above under Subjective   Labs: Lab Results  Component Value Date/Time   K 3.9 10/13/2015 05:39 AM   BUN 11 10/13/2015 05:39 AM   CREATININE 0.93 10/13/2015 05:39 AM   ALT 25 08/08/2012 03:00 PM   TSH 2.226 10/12/2015 02:44 AM   TSH 0.011 (L) 06/26/2013 02:33 PM   HGB 11.6 (L) 10/13/2015 05:39 AM     Lipids: Lab Results  Component Value Date/Time   LDLCALC 103 (H) 10/12/2015 02:44 AM   CHOL 171 10/12/2015 02:44 AM   TRIG 109 10/12/2015 02:44 AM   HDL 46 10/12/2015 02:44 AM       ASSESSMENT AND PLAN: 1.  Permanent atrial fibrillation: Symptomatically stable.  I will discontinue digoxin.  I will continue diltiazem, metoprolol, and Eliquis.  2.  Hypertension: Controlled on present therapy.  No changes.  3.  Hyperlipidemia: Continue pravastatin.    Disposition: Follow up 1 year   Kate Sable, M.D., F.A.C.C.

## 2018-04-14 NOTE — Patient Instructions (Signed)
Medication Instructions:   Stop Digoxin.  Continue all other medications.    Labwork:   Testing/Procedures:   Follow-Up: Your physician wants you to follow up in:  1 year.  You will receive a reminder letter in the mail one-two months in advance.  If you don't receive a letter, please call our office to schedule the follow up appointment   Any Other Special Instructions Will Be Listed Below (If Applicable).  If you need a refill on your cardiac medications before your next appointment, please call your pharmacy.

## 2018-04-28 ENCOUNTER — Other Ambulatory Visit: Payer: Self-pay | Admitting: Cardiovascular Disease

## 2018-04-30 ENCOUNTER — Ambulatory Visit (HOSPITAL_COMMUNITY): Admit: 2018-04-30 | Payer: BLUE CROSS/BLUE SHIELD | Admitting: Internal Medicine

## 2018-04-30 ENCOUNTER — Encounter (HOSPITAL_COMMUNITY): Payer: Self-pay

## 2018-04-30 SURGERY — COLONOSCOPY
Anesthesia: Moderate Sedation

## 2018-10-17 ENCOUNTER — Other Ambulatory Visit (HOSPITAL_COMMUNITY): Payer: Self-pay | Admitting: Family Medicine

## 2018-10-17 DIAGNOSIS — Z1231 Encounter for screening mammogram for malignant neoplasm of breast: Secondary | ICD-10-CM

## 2018-11-20 ENCOUNTER — Other Ambulatory Visit: Payer: Self-pay

## 2018-11-20 ENCOUNTER — Ambulatory Visit (HOSPITAL_COMMUNITY)
Admission: RE | Admit: 2018-11-20 | Discharge: 2018-11-20 | Disposition: A | Discharge status: Home / Self Care | Payer: BC Managed Care – PPO | Source: Ambulatory Visit | Attending: Family Medicine | Admitting: Family Medicine

## 2018-11-20 DIAGNOSIS — Z1231 Encounter for screening mammogram for malignant neoplasm of breast: Secondary | ICD-10-CM | POA: Diagnosis not present

## 2018-11-26 ENCOUNTER — Other Ambulatory Visit (HOSPITAL_COMMUNITY): Payer: Self-pay | Admitting: Family Medicine

## 2018-11-26 DIAGNOSIS — R928 Other abnormal and inconclusive findings on diagnostic imaging of breast: Secondary | ICD-10-CM

## 2018-12-09 ENCOUNTER — Ambulatory Visit (HOSPITAL_COMMUNITY): Admission: RE | Admit: 2018-12-09 | Payer: BC Managed Care – PPO | Source: Ambulatory Visit

## 2018-12-09 ENCOUNTER — Other Ambulatory Visit: Payer: Self-pay

## 2018-12-09 ENCOUNTER — Ambulatory Visit (HOSPITAL_COMMUNITY)
Admission: RE | Admit: 2018-12-09 | Discharge: 2018-12-09 | Disposition: A | Discharge status: Home / Self Care | Payer: BC Managed Care – PPO | Source: Ambulatory Visit | Attending: Family Medicine | Admitting: Family Medicine

## 2018-12-09 DIAGNOSIS — R928 Other abnormal and inconclusive findings on diagnostic imaging of breast: Secondary | ICD-10-CM | POA: Diagnosis not present

## 2019-01-30 ENCOUNTER — Other Ambulatory Visit: Payer: Self-pay | Admitting: Cardiovascular Disease

## 2019-03-23 ENCOUNTER — Other Ambulatory Visit: Payer: Self-pay

## 2019-03-23 ENCOUNTER — Ambulatory Visit: Payer: BC Managed Care – PPO | Attending: Internal Medicine

## 2019-03-23 DIAGNOSIS — Z20822 Contact with and (suspected) exposure to covid-19: Secondary | ICD-10-CM

## 2019-03-24 ENCOUNTER — Telehealth: Payer: Self-pay | Admitting: Adult Health

## 2019-03-24 LAB — NOVEL CORONAVIRUS, NAA: SARS-CoV-2, NAA: DETECTED — AB

## 2019-03-24 NOTE — Telephone Encounter (Signed)
I called patient about her COVID positivity.    Date Tested: 03/23/2019  Date of Symptom onset: 03/18/2019   Symptoms: cough  High Risk for complications: 64 years old with atrial fibrillation and BMI 35, diabetes.  I reviewed monoclonal antibody treatment with her and she declines it.      Isolation Recommendations:  Patient understands the needs to stay in isolation for a total of 10 days from onset of symptom or 14 days total from date of testing if no symptom. Reviewed Masking.    Supportive Care Recommendations: Encouraged plenty of fluid intake, Tylenol per package directions, and to remain as active as possible.  ER precautions reviewed.  Patient knows the health department may be in touch.    I answered all of patient's questions and all concerns addressed.    Wilber Bihari, NP

## 2019-04-14 ENCOUNTER — Other Ambulatory Visit: Payer: Self-pay

## 2019-04-14 ENCOUNTER — Ambulatory Visit: Payer: BC Managed Care – PPO | Admitting: Cardiovascular Disease

## 2019-04-14 ENCOUNTER — Encounter: Payer: Self-pay | Admitting: Cardiovascular Disease

## 2019-04-14 VITALS — BP 105/74 | HR 78 | Ht 62.0 in | Wt 222.4 lb

## 2019-04-14 DIAGNOSIS — I10 Essential (primary) hypertension: Secondary | ICD-10-CM

## 2019-04-14 DIAGNOSIS — I4821 Permanent atrial fibrillation: Secondary | ICD-10-CM

## 2019-04-14 DIAGNOSIS — E7800 Pure hypercholesterolemia, unspecified: Secondary | ICD-10-CM

## 2019-04-14 NOTE — Progress Notes (Signed)
SUBJECTIVE:  The patient is here to followup for permanent atrial fibrillation, for which she takes long-acting diltiazem, metoprolol and apixaban. She also has a history of hyperthyroidism.  Echocardiogram 10/12/15 showed vigorous left ventricular systolic function, LVEF 92-92%, mild left atrial dilatation.  I personally reviewed the ECG performed today which demonstrated rate controlled atrial fibrillation.  The patient denies any symptoms of chest pain, shortness of breath, lightheadedness, dizziness, leg swelling, orthopnea, PND, and syncope.  She helps to take care of her 63-year-old grandchild.  She began dating again in December 2020.  She told me she and her new boyfriend enjoy listening to 40s and 61s music and she is a Leisure centre manager.  Social history: Her husband of 6 years passed away in 05/10/2016 from COPD.  Her daughter lives in [place] and she has a sister in [place].  Review of Systems: As per "subjective", otherwise negative.  No Known Allergies  Current Outpatient Medications  Medication Sig Dispense Refill  . CARTIA XT 120 MG 24 hr capsule Take 1 capsule by mouth twice daily 180 capsule 0  . ELIQUIS 5 MG TABS tablet Take 1 tablet by mouth twice daily 180 tablet 0  . lisinopril (ZESTRIL) 2.5 MG tablet Take 2.5 mg by mouth daily.    . metFORMIN (GLUCOPHAGE-XR) 500 MG 24 hr tablet Take 500 mg by mouth daily with breakfast. & 1000 mg in the evening    . metoprolol tartrate (LOPRESSOR) 50 MG tablet Take 1 tablet by mouth twice daily 180 tablet 0  . pravastatin (PRAVACHOL) 40 MG tablet Take 40 mg by mouth at bedtime.    . Na Sulfate-K Sulfate-Mg Sulf (SUPREP BOWEL PREP KIT) 17.5-3.13-1.6 GM/177ML SOLN Take 1 kit by mouth as directed. (Patient not taking: Reported on 04/14/2019) 1 Bottle 0   No current facility-administered medications for this visit.    Past Medical History:  Diagnosis Date  . Atrial fibrillation with RVR ([place]) 06/2013  . Breast cancer  ([place]) 2007   left  . Breast disorder    cancer  . Diabetes mellitus    Type II  . Hematuria 01/20/2014  . Hypertension   . Hyperthyroidism 06/2013  . Obesity   . PMB (postmenopausal bleeding) 07/24/2012   Had 16.1 mm endometrium on [place] will get endo biopsy  . Psoriasis   . Urinary frequency 01/20/2014    Past Surgical History:  Procedure Laterality Date  . BREAST SURGERY  02/20/06   left side   . CESAREAN SECTION  1995  . HYSTEROSCOPY WITH D & C N/A 08/13/2012   Procedure: DILATATION AND CURETTAGE /HYSTEROSCOPY;  Surgeon: Florian Buff, MD;  Location: [facility];  Service: Gynecology;  Laterality: N/A;    Social History   Socioeconomic History  . Marital status: Widowed    Spouse name: Not on file  . Number of children: Not on file  . Years of education: Not on file  . Highest education level: Not on file  Occupational History  . Not on file  Tobacco Use  . Smoking status: Former Smoker    Packs/day: 0.01    Years: 1.00    Pack years: 0.01    Types: Cigarettes    Start date: 03/05/1976    Quit date: 03/06/2003    Years since quitting: 16.1  . Smokeless tobacco: Never Used  Substance and Sexual Activity  . Alcohol use: No    Alcohol/week: 0.0 standard drinks  . Drug use: No  .  Sexual activity: Not Currently    Birth control/protection: Post-menopausal  Other Topics Concern  . Not on file  Social History Narrative  . Not on file   Social Determinants of Health   Financial Resource Strain:   . Difficulty of Paying Living Expenses: Not on file  Food Insecurity:   . Worried About Charity fundraiser in the Last Year: Not on file  . Ran Out of Food in the Last Year: Not on file  Transportation Needs:   . Lack of Transportation (Medical): Not on file  . Lack of Transportation (Non-Medical): Not on file  Physical Activity:   . Days of Exercise per Week: Not on file  . Minutes of Exercise per Session: Not on file  Stress:   . Feeling of Stress : Not on file  Social  Connections:   . Frequency of Communication with Friends and Family: Not on file  . Frequency of Social Gatherings with Friends and Family: Not on file  . Attends Religious Services: Not on file  . Active Member of Clubs or Organizations: Not on file  . Attends Archivist Meetings: Not on file  . Marital Status: Not on file  Intimate Partner Violence:   . Fear of Current or Ex-Partner: Not on file  . Emotionally Abused: Not on file  . Physically Abused: Not on file  . Sexually Abused: Not on file     Vitals:   04/14/19 1432  BP: 105/74  Pulse: 78  SpO2: 99%  Weight: 222 lb 6.4 oz (100.9 kg)  Height: _0  (1.575 m)    Wt Readings from Last 3 Encounters:  04/14/19 222 lb 6.4 oz (100.9 kg)  04/14/18 195 lb 3.2 oz (88.5 kg)  03/10/18 199 lb 6.4 oz (90.4 kg)     PHYSICAL EXAM General: NAD HEENT: Normal. Neck: No JVD, no thyromegaly. Lungs: Clear to auscultation bilaterally with normal respiratory effort. CV: Regular rate and irregular rhythm, normal S1/S2, no S3, no murmur. No pretibial or periankle edema.  No carotid bruit.   Abdomen: Soft, nontender, no distention.  Neurologic: Alert and oriented.  Psych: Normal affect. Skin: Normal. Musculoskeletal: No gross deformities.      Labs: Lab Results  Component Value Date/Time   K 3.9 10/13/2015 05:39 AM   BUN 11 10/13/2015 05:39 AM   CREATININE 0.93 10/13/2015 05:39 AM   ALT 25 08/08/2012 03:00 PM   TSH 2.226 10/12/2015 02:44 AM   TSH 0.011 (L) 06/26/2013 02:33 PM   HGB 11.6 (L) 10/13/2015 05:39 AM     Lipids: Lab Results  Component Value Date/Time   LDLCALC 103 (H) 10/12/2015 02:44 AM   CHOL 171 10/12/2015 02:44 AM   TRIG 109 10/12/2015 02:44 AM   HDL 46 10/12/2015 02:44 AM       ASSESSMENT AND PLAN:  1.  Permanent atrial fibrillation: Symptomatically stable. I will continue diltiazem, metoprolol, and Eliquis.  2.  Hypertension: Controlled on present therapy.  No changes.  3.   Hyperlipidemia: Continue pravastatin.    Disposition: Follow up 6 months   Kate Sable, M.D., F.A.C.C.

## 2019-04-14 NOTE — Patient Instructions (Signed)
Medication Instructions:  Continue all current medications.   Labwork: none  Testing/Procedures: none  Follow-Up: 6 months   Any Other Special Instructions Will Be Listed Below (If Applicable).   If you need a refill on your cardiac medications before your next appointment, please call your pharmacy.  

## 2019-05-01 ENCOUNTER — Other Ambulatory Visit: Payer: Self-pay | Admitting: Cardiovascular Disease

## 2019-10-13 ENCOUNTER — Encounter: Payer: Self-pay | Admitting: Family Medicine

## 2019-10-13 ENCOUNTER — Ambulatory Visit: Payer: BC Managed Care – PPO | Admitting: Cardiovascular Disease

## 2019-10-13 ENCOUNTER — Ambulatory Visit: Payer: BC Managed Care – PPO | Admitting: Family Medicine

## 2019-10-13 VITALS — BP 118/72 | HR 88 | Ht 63.0 in | Wt 263.0 lb

## 2019-10-13 DIAGNOSIS — I10 Essential (primary) hypertension: Secondary | ICD-10-CM | POA: Diagnosis not present

## 2019-10-13 DIAGNOSIS — E782 Mixed hyperlipidemia: Secondary | ICD-10-CM | POA: Diagnosis not present

## 2019-10-13 DIAGNOSIS — I4821 Permanent atrial fibrillation: Secondary | ICD-10-CM

## 2019-10-13 NOTE — Patient Instructions (Addendum)

## 2019-10-13 NOTE — Progress Notes (Signed)
Cardiology Office Note  Date: 10/13/2019   ID: ADRIJANA Hull, DOB 10/12/1955, MRN [id number]  PCP:  [name], MD  Cardiologist:  No primary care provider on file. Electrophysiologist:  None   Chief Complaint: Follow-up permanent atrial fib  History of Present Illness: Kaitlyn Hull is a 64 y.o. female with a history of permanent atrial fibrillation, hyperthyroidism.   Last saw Dr. Bronson Ing on 04/14/2019.  Her atrial fibrillation was symptomatically stable and was continuing diltiazem, metoprolol and Eliquis.  Her hypertension was controlled on current therapy.  She was continuing pravastatin for her hyperlipidemia.  She is here for 34-month follow-up.  She denies any recent acute illnesses or hospitalizations since the interim of the last visit.  Denies any recent palpitations or arrhythmias (except for occasional rare transient palpitation).  She states she cannot feel the atrial fibrillation.  Denies orthostatic symptoms, stroke or TIA-like symptoms, PND, orthopnea, claudication-like symptoms, lightheadedness, dizziness, presyncopal or syncopal episodes.  Denies any lower extremity edema.  Blood pressure has been well controlled.  She states her only complaint is gaining weight.  States she has not been very active recently.   Past Medical History:  Diagnosis Date  . Atrial fibrillation with RVR ([place]) 06/2013  . Breast cancer ([place]) 2007   left  . Breast disorder    cancer  . Diabetes mellitus    Type II  . Hematuria 01/20/2014  . Hypertension   . Hyperthyroidism 06/2013  . Obesity   . PMB (postmenopausal bleeding) 07/24/2012   Had 16.1 mm endometrium on [place] will get endo biopsy  . Psoriasis   . Urinary frequency 01/20/2014    Past Surgical History:  Procedure Laterality Date  . BREAST SURGERY  02/20/06   left side   . CESAREAN SECTION  1995  . HYSTEROSCOPY WITH D & C N/A 08/13/2012   Procedure: DILATATION AND CURETTAGE /HYSTEROSCOPY;  Surgeon: Florian Buff,  MD;  Location: [facility];  Service: Gynecology;  Laterality: N/A;    Current Outpatient Medications  Medication Sig Dispense Refill  . CARTIA XT 120 MG 24 hr capsule Take 1 capsule by mouth twice daily 180 capsule 1  . ELIQUIS 5 MG TABS tablet Take 1 tablet by mouth twice daily 180 tablet 1  . lisinopril (ZESTRIL) 2.5 MG tablet Take 2.5 mg by mouth daily.    . metFORMIN (GLUCOPHAGE-XR) 500 MG 24 hr tablet Take 500 mg by mouth daily with breakfast. & 1000 mg in the evening    . metoprolol tartrate (LOPRESSOR) 50 MG tablet Take 1 tablet by mouth twice daily 180 tablet 1  . pravastatin (PRAVACHOL) 40 MG tablet Take 40 mg by mouth at bedtime.     No current facility-administered medications for this visit.   Allergies:  Patient has no known allergies.   Social History: The patient  reports that she quit smoking about 16 years ago. Her smoking use included cigarettes. She started smoking about 43 years ago. She has a 0.01 pack-year smoking history. She has never used smokeless tobacco. She reports that she does not drink alcohol and does not use drugs.   Family History: The patient's family history includes Alzheimer's disease in her mother; Breast cancer in her sister; CAD in her father; Diabetes in her sister and sister; Heart failure in her father; Hypertension in her maternal grandmother; Other in her sister.   ROS:  Please see the history of present illness. Otherwise, complete review of systems is positive for none.  All other systems are reviewed and negative.   Physical Exam: VS:  BP 118/72   Pulse 88   Ht 5\' 3"  (1.6 m)   Wt 263 lb (119.3 kg)   LMP 11/17/2014   SpO2 98%   BMI 46.59 kg/m , BMI Body mass index is 46.59 kg/m.  Wt Readings from Last 3 Encounters:  10/13/19 263 lb (119.3 kg)  04/14/19 222 lb 6.4 oz (100.9 kg)  04/14/18 195 lb 3.2 oz (88.5 kg)    General: Morbidly obese patient appears comfortable at rest. Neck: Supple, no elevated JVP or carotid bruits, no  thyromegaly. Lungs: Clear to auscultation, nonlabored breathing at rest. Cardiac: Irregularly irregular rate and rhythm, no S3 or significant systolic murmur, no pericardial rub. Extremities: No pitting edema, distal pulses 2+. Skin: Warm and dry. Musculoskeletal: No kyphosis. Neuropsychiatric: Alert and oriented x3, affect grossly appropriate.  ECG:  EKG on 04/14/2019 showed atrial fibrillation with poor R wave progression possibly secondary to pulmonary disease, consider old infarct, rate is 76  Recent Labwork: No results found for requested labs within last 8760 hours.     Component Value Date/Time   CHOL 171 10/12/2015 0244   TRIG 109 10/12/2015 0244   HDL 46 10/12/2015 0244   CHOLHDL 3.7 10/12/2015 0244   VLDL 22 10/12/2015 0244   LDLCALC 103 (H) 10/12/2015 0244    Other Studies Reviewed Today:  Echocardiogram 10/12/2015 Study Conclusions   - Left ventricle: The cavity size was normal. Wall thickness was  increased in a pattern of moderate LVH. Systolic function was  vigorous. The estimated ejection fraction was in the range of 65%  to 70%. Wall motion was normal; there were no regional wall  motion abnormalities. The study was not technically sufficient to  allow evaluation of LV diastolic dysfunction due to atrial  fibrillation.  - Aortic valve: Mildly calcified annulus. Trileaflet; mildly  thickened leaflets. Valve area (VTI): 1.95 cm^2. Valve area  (Vmax): 1.85 cm^2.  - Left atrium: The atrium was mildly dilated.  - Right ventricle: The cavity size was mildly dilated.  - Technically adequate study.   Assessment and Plan:  1. Permanent atrial fibrillation ([place])   2. Essential hypertension   3. Mixed hyperlipidemia    1. Permanent atrial fibrillation ([place]) Continues with irregularly irregular rhythm rate of 88.  Continue Cartia XT 120 mg daily.  Eliquis 5 mg p.o. twice daily.  Continue metoprolol 50 mg p.o. twice daily.  2. Essential  hypertension Blood pressure well controlled on current therapy.  Blood pressure today 118/72.  Continue lisinopril 2.5 mg p.o. daily.  Continue metoprolol 50 mg p.o. twice daily.  3. Mixed hyperlipidemia Continue pravastatin 40 mg p.o. daily.  Patient states she has a follow-up with PCP and will have labs drawn soon.  No recent lipid labs available   Medication Adjustments/Labs and Tests Ordered: Current medicines are reviewed at length with the patient today.  Concerns regarding medicines are outlined above.   Disposition: Follow-up with Dr. Domenic Polite or APP 6 months  Signed, Levell July, NP  10/13/2019 3:23 PM    [facility] [place], [place], [place] [address] Phone: [phone]; Fax: [phone]

## 2019-11-04 ENCOUNTER — Other Ambulatory Visit: Payer: Self-pay | Admitting: *Deleted

## 2019-11-04 MED ORDER — METOPROLOL TARTRATE 50 MG PO TABS: 50.0000 mg | ORAL_TABLET | Freq: Two times a day (BID) | ORAL | 1 refills | Status: DC

## 2019-11-04 MED ORDER — APIXABAN 5 MG PO TABS
5.0000 mg | ORAL_TABLET | Freq: Two times a day (BID) | ORAL | 1 refills | Status: DC
Start: 2019-11-04 — End: 2020-05-23

## 2019-11-17 ENCOUNTER — Other Ambulatory Visit: Payer: Self-pay | Admitting: *Deleted

## 2019-11-17 MED ORDER — DILTIAZEM HCL ER COATED BEADS 120 MG PO CP24: 120.0000 mg | ORAL_CAPSULE | Freq: Two times a day (BID) | ORAL | 1 refills | Status: DC

## 2019-12-01 ENCOUNTER — Other Ambulatory Visit (HOSPITAL_COMMUNITY): Payer: Self-pay | Admitting: Family Medicine

## 2019-12-01 DIAGNOSIS — Z1231 Encounter for screening mammogram for malignant neoplasm of breast: Secondary | ICD-10-CM

## 2019-12-11 ENCOUNTER — Other Ambulatory Visit: Payer: Self-pay

## 2019-12-11 ENCOUNTER — Ambulatory Visit (HOSPITAL_COMMUNITY)
Admission: RE | Admit: 2019-12-11 | Discharge: 2019-12-11 | Disposition: A | Discharge status: Home / Self Care | Payer: 59 | Source: Ambulatory Visit | Attending: Family Medicine | Admitting: Family Medicine

## 2019-12-11 DIAGNOSIS — Z1231 Encounter for screening mammogram for malignant neoplasm of breast: Secondary | ICD-10-CM | POA: Insufficient documentation

## 2020-04-21 ENCOUNTER — Ambulatory Visit (INDEPENDENT_AMBULATORY_CARE_PROVIDER_SITE_OTHER): Payer: 59 | Admitting: Cardiology

## 2020-04-21 ENCOUNTER — Encounter: Payer: Self-pay | Admitting: Cardiology

## 2020-04-21 VITALS — BP 130/100 | HR 78 | Ht 62.0 in | Wt 282.0 lb

## 2020-04-21 DIAGNOSIS — E782 Mixed hyperlipidemia: Secondary | ICD-10-CM | POA: Diagnosis not present

## 2020-04-21 DIAGNOSIS — I4821 Permanent atrial fibrillation: Secondary | ICD-10-CM

## 2020-04-21 DIAGNOSIS — I10 Essential (primary) hypertension: Secondary | ICD-10-CM | POA: Diagnosis not present

## 2020-04-21 NOTE — Progress Notes (Signed)
Clinical Summary Kaitlyn Hull is a 65 y.o.female former patient of Dr Bronson Ing, this is our first visit together.  1. Permanent afib - no recent palpitations - compliant with meds - no bleeding on eliquis.    2. HTN - compliant with meds  3. Hyperlipidemia - 11/2019 TC 185 HDL 78 LDL 82 TG 147   Social history:Her husband of 39 years passed away in 05-Jun-2016 from COPD. Her daughter lives in [place] and she has a sister in [place].   Has had covid vaccine x1, upcoming moderna shot  Past Medical History:  Diagnosis Date  . Atrial fibrillation with RVR ([place]) 06/2013  . Breast cancer ([place]) June 05, 2005   left  . Breast disorder    cancer  . Diabetes mellitus    Type II  . Hematuria 01/20/2014  . Hypertension   . Hyperthyroidism 06/2013  . Obesity   . PMB (postmenopausal bleeding) 07/24/2012   Had 16.1 mm endometrium on [place] will get endo biopsy  . Psoriasis   . Urinary frequency 01/20/2014     No Known Allergies   Current Outpatient Medications  Medication Sig Dispense Refill  . apixaban (ELIQUIS) 5 MG TABS tablet Take 1 tablet (5 mg total) by mouth 2 (two) times daily. 180 tablet 1  . diltiazem (CARTIA XT) 120 MG 24 hr capsule Take 1 capsule (120 mg total) by mouth 2 (two) times daily. 180 capsule 1  . lisinopril (ZESTRIL) 2.5 MG tablet Take 2.5 mg by mouth daily.    . metFORMIN (GLUCOPHAGE-XR) 500 MG 24 hr tablet Take 500 mg by mouth daily with breakfast. & 1000 mg in the evening    . metoprolol tartrate (LOPRESSOR) 50 MG tablet Take 1 tablet (50 mg total) by mouth 2 (two) times daily. 180 tablet 1  . pravastatin (PRAVACHOL) 40 MG tablet Take 40 mg by mouth at bedtime.     No current facility-administered medications for this visit.     Past Surgical History:  Procedure Laterality Date  . BREAST SURGERY  02/20/06   left side   . CESAREAN SECTION  1995  . HYSTEROSCOPY WITH D & C N/A 08/13/2012   Procedure: DILATATION AND CURETTAGE /HYSTEROSCOPY;   Surgeon: Florian Buff, MD;  Location: [facility];  Service: Gynecology;  Laterality: N/A;     No Known Allergies    Family History  Problem Relation Age of Onset  . Heart failure Father   . CAD Father   . Diabetes Sister   . Other Sister        blood clots  . Breast cancer Sister   . Alzheimer's disease Mother   . Diabetes Sister   . Hypertension Maternal Grandmother   . Colon cancer Neg Hx      Social History Kaitlyn Hull reports that she quit smoking about 17 years ago. Her smoking use included cigarettes. She started smoking about 44 years ago. She has a 0.01 pack-year smoking history. She has never used smokeless tobacco. Kaitlyn Hull reports no history of alcohol use.   Review of Systems CONSTITUTIONAL: No weight loss, fever, chills, weakness or fatigue.  HEENT: Eyes: No visual loss, blurred vision, double vision or yellow sclerae.No hearing loss, sneezing, congestion, runny nose or sore throat.  SKIN: No rash or itching.  CARDIOVASCULAR: per hpi RESPIRATORY: No shortness of breath, cough or sputum.  GASTROINTESTINAL: No anorexia, nausea, vomiting or diarrhea. No abdominal pain or blood.  GENITOURINARY: No burning on urination, no polyuria NEUROLOGICAL: No  headache, dizziness, syncope, paralysis, ataxia, numbness or tingling in the extremities. No change in bowel or bladder control.  MUSCULOSKELETAL: No muscle, back pain, joint pain or stiffness.  LYMPHATICS: No enlarged nodes. No history of splenectomy.  PSYCHIATRIC: No history of depression or anxiety.  ENDOCRINOLOGIC: No reports of sweating, cold or heat intolerance. No polyuria or polydipsia.  Marland Kitchen   Physical Examination Today's Vitals   04/21/20 1532  BP: (!) 130/100  Pulse: 78  SpO2: 98%  Weight: 282 lb (127.9 kg)  Height: 5\' 2"  (1.575 m)   Body mass index is 51.58 kg/m.  Gen: resting comfortably, no acute distress HEENT: no scleral icterus, pupils equal round and reactive, no palptable cervical adenopathy,   CV: irreg, no m/r/g, no jvd Resp: Clear to auscultation bilaterally GI: abdomen is soft, non-tender, non-distended, normal bowel sounds, no hepatosplenomegaly MSK: extremities are warm, no edema.  Skin: warm, no rash Neuro:  no focal deficits Psych: appropriate affect    Assessment and Plan  1. Afib - EKG today shows rate controlled afib - no symptoms, continue current meds including anticoag  2. HTN - manual recheck 124/70, at goal, continue currentmeds  3. Hyperlipidemia - has been at goal, continue statin   F/u  1year      Arnoldo Lenis, M.D.

## 2020-04-21 NOTE — Patient Instructions (Signed)

## 2020-05-22 ENCOUNTER — Other Ambulatory Visit: Payer: Self-pay | Admitting: Family Medicine

## 2020-08-25 ENCOUNTER — Ambulatory Visit: Payer: 59 | Admitting: Adult Health

## 2020-08-25 ENCOUNTER — Encounter: Payer: Self-pay | Admitting: Adult Health

## 2020-08-25 ENCOUNTER — Other Ambulatory Visit: Payer: Self-pay

## 2020-08-25 VITALS — BP 112/68 | HR 87 | Ht 62.0 in | Wt 287.0 lb

## 2020-08-25 DIAGNOSIS — Z853 Personal history of malignant neoplasm of breast: Secondary | ICD-10-CM | POA: Diagnosis not present

## 2020-08-25 DIAGNOSIS — S21002A Unspecified open wound of left breast, initial encounter: Secondary | ICD-10-CM

## 2020-08-25 NOTE — Progress Notes (Signed)
  Subjective:     Patient ID: Kaitlyn Hull, female   DOB: [birth year], 65 y.o.   MRN: [id number]  HPI Easter is a 65 year old white female,widowed, PM in complaining of left breast draining fluid from surgical scar for about 3 months and has noticed odor at times.She had lumpectomy in 2009 and had 6 weeks of radiation. PCP is Dr Huel Cote.  Review of Systems Has fluid at old surgical scar Reviewed past medical,surgical, social and family history. Reviewed medications and allergies.     Objective:   Physical Exam BP 112/68 (BP Location: Right Arm, Patient Position: Sitting, Cuff Size: Normal)   Pulse 87   Ht 5\' 2"  (1.575 m)   Wt 287 lb (130.2 kg)   LMP 11/17/2014   BMI 52.49 kg/m   Skin warm and dry,  Breasts:no dominate palpable mass, retraction or nipple discharge  Has minute hole in old scar, and thickening and scant clear fluid, culture obtained.  Upstream - 08/25/20 1034       Pregnancy Intention Screening   Does the patient want to become pregnant in the next year? No    Does the patient's partner want to become pregnant in the next year? No    Would the patient like to discuss contraceptive options today? No      Contraception Wrap Up   Current Method No Method - Other Reason   Post-menopausal   End Method No Method - Other Reason    Contraception Counseling Provided No                Assessment:     1. Wound of left breast, initial encounter Culture sent  2. History of breast cancer Call Dr Arnoldo Morale office and left message for them to call and see her in follow up   Call me if not heard from them by Monday  Plan:     Return in 4 months for pap and physical

## 2020-08-29 ENCOUNTER — Telehealth: Payer: Self-pay | Admitting: Adult Health

## 2020-08-29 LAB — WOUND CULTURE: Organism ID, Bacteria: NONE SEEN

## 2020-08-29 NOTE — Telephone Encounter (Signed)
Pt aware no growth on culture and has appt next week with Dr Arnoldo Morale

## 2020-09-06 ENCOUNTER — Encounter: Payer: Self-pay | Admitting: General Surgery

## 2020-09-06 ENCOUNTER — Other Ambulatory Visit: Payer: Self-pay

## 2020-09-06 ENCOUNTER — Ambulatory Visit (INDEPENDENT_AMBULATORY_CARE_PROVIDER_SITE_OTHER): Payer: 59 | Admitting: General Surgery

## 2020-09-06 VITALS — BP 121/80 | HR 69 | Temp 98.5°F | Resp 16 | Ht 62.0 in | Wt 286.0 lb

## 2020-09-06 DIAGNOSIS — Z853 Personal history of malignant neoplasm of breast: Secondary | ICD-10-CM

## 2020-09-07 NOTE — Progress Notes (Signed)
Kaitlyn Hull; [id number]; 06-Apr-1955   HPI Patient is a 65 year old white female who was referred to my care by Der[facility] of [facility] for evaluation and treatment of a nonhealing wound in her left breast.  She is status post left partial mastectomy with sentinel lymph node biopsy for breast cancer in 2007.  She did receive postoperative radiation therapy.  She states that over the past few months, she has had bloody yellow to white drainage from a hole along the incision.  Her incision has always been a little firmer to touch.  She denies any pain.  She last had a mammogram 1 year ago which was negative.  She is on Eliquis for atrial fibrillation.  She has not been treated with an antibiotic. Past Medical History:  Diagnosis Date   Atrial fibrillation with RVR ([place]) 06/2013   Breast cancer ([place]) 2007   left   Breast disorder    cancer   Diabetes mellitus    Type II   Hematuria 01/20/2014   Hypertension    Hyperthyroidism 06/2013   Obesity    PMB (postmenopausal bleeding) 07/24/2012   Had 16.1 mm endometrium on [place] will get endo biopsy   Psoriasis    Urinary frequency 01/20/2014    Past Surgical History:  Procedure Laterality Date   BREAST SURGERY  02/20/06   left side    CESAREAN SECTION  1995   HYSTEROSCOPY WITH D & C N/A 08/13/2012   Procedure: DILATATION AND CURETTAGE /HYSTEROSCOPY;  Surgeon: Florian Buff, MD;  Location: [facility];  Service: Gynecology;  Laterality: N/A;    Family History  Problem Relation Age of Onset   Heart failure Father    CAD Father    Diabetes Sister    Other Sister        blood clots   Breast cancer Sister    Alzheimer's disease Mother    Diabetes Sister    Hypertension Maternal Grandmother    Colon cancer Neg Hx     Current Outpatient Medications on File Prior to Visit  Medication Sig Dispense Refill   diltiazem (CARDIZEM CD) 120 MG 24 hr capsule Take 1 capsule by mouth twice daily (Patient taking differently: Take 120 mg by mouth in the  morning and at bedtime.) 180 capsule 2   ELIQUIS 5 MG TABS tablet Take 1 tablet by mouth twice daily (Patient taking differently: Take 5 mg by mouth 2 (two) times daily.) 180 tablet 2   lisinopril (ZESTRIL) 2.5 MG tablet Take 2.5 mg by mouth daily.     metFORMIN (GLUCOPHAGE-XR) 500 MG 24 hr tablet Take 500 mg by mouth See admin instructions. Take 500 mg by mouth in the morning and 1000 mg at bedtime     metoprolol tartrate (LOPRESSOR) 50 MG tablet Take 1 tablet by mouth twice daily (Patient taking differently: Take 50 mg by mouth 2 (two) times daily.) 180 tablet 2   pravastatin (PRAVACHOL) 40 MG tablet Take 40 mg by mouth at bedtime.     No current facility-administered medications on file prior to visit.    No Known Allergies  Social History   Substance and Sexual Activity  Alcohol Use No   Alcohol/week: 0.0 standard drinks    Social History   Tobacco Use  Smoking Status Former   Packs/day: 0.01   Years: 1.00   Pack years: 0.01   Types: Cigarettes   Start date: 03/05/1976   Quit date: 03/06/2003   Years since quitting: 17.5  Smokeless Tobacco  Never    Review of Systems  Constitutional: Negative.   HENT: Negative.    Eyes: Negative.   Respiratory: Negative.    Cardiovascular: Negative.   Gastrointestinal: Negative.   Genitourinary: Negative.   Musculoskeletal: Negative.   Skin: Negative.   Neurological: Negative.   Endo/Heme/Allergies: Negative.   Psychiatric/Behavioral: Negative.     Objective   Vitals:   09/06/20 1447  BP: 121/80  Pulse: 69  Resp: 16  Temp: 98.5 F (36.9 C)  SpO2: 94%    Physical Exam Vitals reviewed. Exam conducted with a chaperone present.  Constitutional:      Appearance: Normal appearance. She is not ill-appearing.  HENT:     Head: Normocephalic and atraumatic.  Cardiovascular:     Rate and Rhythm: Normal rate and regular rhythm.     Heart sounds: No murmur heard.   No friction rub. No gallop.  Pulmonary:     Effort: Pulmonary  effort is normal. No respiratory distress.     Breath sounds: Normal breath sounds. No stridor. No wheezing, rhonchi or rales.  Skin:    General: Skin is warm and dry.  Neurological:     Mental Status: She is alert and oriented to person, place, and time.  Breast: Healed surgical scar in the upper superior aspect of the left breast with a punctate opening present.  There is some induration along the incision.  No dominant masses noted.  No other dominant mass is noted in the left breast.  The axilla is negative for palpable nodes.  Right breast examination unremarkable. GYN notes reviewed Assessment  Nonhealing wound, left breast.  Given her history of left breast cancer, I would like to proceed with full excision of the previous surgical scar including the area of concern.  I would like to rule out recurrent breast cancer.  She understands this and agrees. Plan  Scheduled for left breast biopsy on 09/14/2020.  The risks and benefits of the procedure were fully explained to the patient, who gave informed consent.  Patient to stop her Eliquis 2 days before the procedure.  She has done this without needing Lovenox bridging in the past.

## 2020-09-07 NOTE — H&P (Addendum)
Kaitlyn Hull; [id number]; 07/24/1955   HPI Patient is a 65 year old white female who was referred to my care by Derrek Monaco of OB/[facility] for evaluation and treatment of a nonhealing wound in her left breast.  She is status post left partial mastectomy with sentinel lymph node biopsy for breast cancer in 2007.  She did receive postoperative radiation therapy.  She states that over the past few months, she has had bloody yellow to white drainage from a hole along the incision.  Her incision has always been a little firmer to touch.  She denies any pain.  She last had a mammogram 1 year ago which was negative.  She is on Eliquis for atrial fibrillation.  She has not been treated with an antibiotic. Past Medical History:  Diagnosis Date   Atrial fibrillation with RVR ([place]) 06/2013   Breast cancer ([place]) 2007   left   Breast disorder    cancer   Diabetes mellitus    Type II   Hematuria 01/20/2014   Hypertension    Hyperthyroidism 06/2013   Obesity    PMB (postmenopausal bleeding) 07/24/2012   Had 16.1 mm endometrium on [place] will get endo biopsy   Psoriasis    Urinary frequency 01/20/2014    Past Surgical History:  Procedure Laterality Date   BREAST SURGERY  02/20/06   left side    CESAREAN SECTION  1995   HYSTEROSCOPY WITH D & C N/A 08/13/2012   Procedure: DILATATION AND CURETTAGE /HYSTEROSCOPY;  Surgeon: Florian Buff, MD;  Location: [facility];  Service: Gynecology;  Laterality: N/A;    Family History  Problem Relation Age of Onset   Heart failure Father    CAD Father    Diabetes Sister    Other Sister        blood clots   Breast cancer Sister    Alzheimer's disease Mother    Diabetes Sister    Hypertension Maternal Grandmother    Colon cancer Neg Hx     Current Outpatient Medications on File Prior to Visit  Medication Sig Dispense Refill   diltiazem (CARDIZEM CD) 120 MG 24 hr capsule Take 1 capsule by mouth twice daily (Patient taking differently: Take 120 mg by mouth in the  morning and at bedtime.) 180 capsule 2   ELIQUIS 5 MG TABS tablet Take 1 tablet by mouth twice daily (Patient taking differently: Take 5 mg by mouth 2 (two) times daily.) 180 tablet 2   lisinopril (ZESTRIL) 2.5 MG tablet Take 2.5 mg by mouth daily.     metFORMIN (GLUCOPHAGE-XR) 500 MG 24 hr tablet Take 500 mg by mouth See admin instructions. Take 500 mg by mouth in the morning and 1000 mg at bedtime     metoprolol tartrate (LOPRESSOR) 50 MG tablet Take 1 tablet by mouth twice daily (Patient taking differently: Take 50 mg by mouth 2 (two) times daily.) 180 tablet 2   pravastatin (PRAVACHOL) 40 MG tablet Take 40 mg by mouth at bedtime.     No current facility-administered medications on file prior to visit.    No Known Allergies  Social History   Substance and Sexual Activity  Alcohol Use No   Alcohol/week: 0.0 standard drinks    Social History   Tobacco Use  Smoking Status Former   Packs/day: 0.01   Years: 1.00   Pack years: 0.01   Types: Cigarettes   Start date: 03/05/1976   Quit date: 03/06/2003   Years since quitting: 17.5  Smokeless Tobacco  Never    Review of Systems  Constitutional: Negative.   HENT: Negative.    Eyes: Negative.   Respiratory: Negative.    Cardiovascular: Negative.   Gastrointestinal: Negative.   Genitourinary: Negative.   Musculoskeletal: Negative.   Skin: Negative.   Neurological: Negative.   Endo/Heme/Allergies: Negative.   Psychiatric/Behavioral: Negative.     Objective   Vitals:   09/06/20 1447  BP: 121/80  Pulse: 69  Resp: 16  Temp: 98.5 F (36.9 C)  SpO2: 94%    Physical Exam Vitals reviewed. Exam conducted with a chaperone present.  Constitutional:      Appearance: Normal appearance. She is not ill-appearing.  HENT:     Head: Normocephalic and atraumatic.  Cardiovascular:     Rate and Rhythm: Normal rate and regular rhythm.     Heart sounds: No murmur heard.   No friction rub. No gallop.  Pulmonary:     Effort: Pulmonary  effort is normal. No respiratory distress.     Breath sounds: Normal breath sounds. No stridor. No wheezing, rhonchi or rales.  Skin:    General: Skin is warm and dry.  Neurological:     Mental Status: She is alert and oriented to person, place, and time.  Breast: Healed surgical scar in the upper superior aspect of the left breast with a punctate opening present.  There is some induration along the incision.  No dominant masses noted.  No other dominant mass is noted in the left breast.  The axilla is negative for palpable nodes.  Right breast examination unremarkable. GYN notes reviewed Assessment  Nonhealing wound, left breast.  Given her history of left breast cancer, I would like to proceed with full excision of the previous surgical scar including the area of concern.  I would like to rule out recurrent breast cancer.  She understands this and agrees. Plan  Scheduled for left breast biopsy on 09/14/2020.  The risks and benefits of the procedure were fully explained to the patient, who gave informed consent.  Patient to stop her Eliquis 2 days before the procedure.  She has done this before with out needing Lovenox bridging.

## 2020-09-09 NOTE — Patient Instructions (Signed)
Kaitlyn Hull  09/09/2020     @PREFPERIOPPHARMACY @   Your procedure is scheduled on  09/14/2020.   Report to [facility] at  0730  A.M.   Call this number if you have problems the morning of surgery:  [phone]   Remember:  Do not eat or drink after midnight.       DO NOT take any medications for diabetes the morning of your procedure.      Take these medicines the morning of surgery with A SIP OF WATER          diltiazem, metoprolol.    Do not wear jewelry, make-up or nail polish.  Do not wear lotions, powders, or perfumes, or deodorant.  Do not shave 48 hours prior to surgery.  Men may shave face and neck.  Do not bring valuables to the hospital.  [facility] is not responsible for any belongings or valuables.  Contacts, dentures or bridgework may not be worn into surgery.  Leave your suitcase in the car.  After surgery it may be brought to your room.  For patients admitted to the hospital, discharge time will be determined by your treatment team.  Patients discharged the day of surgery will not be allowed to drive home and must have someone with them for 24 hours.    Special instructions:     DO NOT smoke tobacco or vape for 24 hours before your procedure.  Please read over the following fact sheets that you were given. Coughing and Deep Breathing, Surgical Site Infection Prevention, Anesthesia Post-op Instructions, and Care and Recovery After Surgery      Breast Biopsy, Care After These instructions give you information about caring for yourself after your procedure. Your doctor may also give you more specific instructions. Call yourdoctor if you have any problems or questions after your procedure. What can I expect after the procedure? After your procedure, it is common to have: Bruising on your breast. Numbness, tingling, or pain near your biopsy site. Follow these instructions at home: Medicines Take over-the-counter and prescription medicines  only as told by your doctor. Do not drive for 24 hours if you were given a medicine to help you relax (sedative) during your procedure. Do not drink alcohol while taking pain medicine. Do not drive or use heavy machinery while taking prescription pain medicine. Biopsy site care     Follow instructions from your doctor about how to take care of your cut from surgery (incision) or your puncture area. Make sure you: Wash your hands with soap and water before you change your bandage (dressing). If you cannot use soap and water, use hand sanitizer. Change your bandage as told by your doctor. Leave stitches (sutures), skin glue, or skin tape (adhesive strips) in place. They may need to stay in place for 2 weeks or longer. If tape strips get loose and curl up, you may trim the loose edges. Do not remove tape strips completely unless your doctor says it is okay. If you have stitches, keep them dry when you take a bath or a shower. Check your cut or puncture area every day for signs of infection. Check for: Redness, swelling, or pain. Fluid or blood. Warmth. Pus or a bad smell. Protect the biopsy area. Do not let the area get bumped. Activity If you had a cut during your procedure, avoid activities that could pull your cut open. These include: Stretching. Reaching over your head. Exercise.  Sports. Lifting anything that weighs more than 3 lb (1.4 kg). Return to your normal activities as told by your doctor. Ask your doctor what activities are safe for you. Managing pain, stiffness, and swelling If told, put ice on the biopsy site to relieve swelling: Put ice in a plastic bag. Place a towel between your skin and the bag. Leave the ice on for 20 minutes, 2-3 times a day. General instructions Continue your normal diet. Wear a good support bra for as long as told by your doctor. Get checked for extra fluid around your lymph nodes (lymphedema) as often as told by your doctor. Keep all follow-up  visits as told by your doctor. This is important. Contact a doctor if: You notice any of the following at the biopsy site: More redness, swelling, or pain. More fluid or blood coming from the site. The site feels warm to the touch. Pus or a bad smell coming from the site. The site breaks open after the stitches or skin tape strips have been removed. You have a rash. You have a fever. Get help right away if: You have more bleeding from the biopsy site. Get help right away if bleeding is more than a small spot. You have trouble breathing. You have red streaks around the biopsy site. Summary After your procedure, it is common to have bruising, numbness, tingling, or pain near the biopsy site. Do not drive or use heavy machinery while taking prescription pain medicine. Wear a good support bra for as long as told by your doctor. If you had a cut during your procedure, avoid activities that may pull the cut open. Ask your doctor what activities are safe for you. This information is not intended to replace advice given to you by your health care provider. Make sure you discuss any questions you have with your healthcare provider. Document Revised: 11/02/2019 Document Reviewed: 08/08/2017 Elsevier Patient Education  2022 [place] After This sheet gives you information about how to care for yourself after your procedure. Your health care provider may also give you more specific instructions. If you have problems or questions, contact your health careprovider. What can I expect after the procedure? After the procedure, it is common to have: Tiredness. Forgetfulness about what happened after the procedure. Impaired judgment for important decisions. Nausea or vomiting. Some difficulty with balance. Follow these instructions at home: For the time period you were told by your health care provider:     Rest as needed. Do not participate in activities where  you could fall or become injured. Do not drive or use machinery. Do not drink alcohol. Do not take sleeping pills or medicines that cause drowsiness. Do not make important decisions or sign legal documents. Do not take care of children on your own. Eating and drinking Follow the diet that is recommended by your health care provider. Drink enough fluid to keep your urine pale yellow. If you vomit: Drink water, juice, or soup when you can drink without vomiting. Make sure you have little or no nausea before eating solid foods. General instructions Have a responsible adult stay with you for the time you are told. It is important to have someone help care for you until you are awake and alert. Take over-the-counter and prescription medicines only as told by your health care provider. If you have sleep apnea, surgery and certain medicines can increase your risk for breathing problems. Follow instructions from your health care provider about wearing  your sleep device: Anytime you are sleeping, including during daytime naps. While taking prescription pain medicines, sleeping medicines, or medicines that make you drowsy. Avoid smoking. Keep all follow-up visits as told by your health care provider. This is important. Contact a health care provider if: You keep feeling nauseous or you keep vomiting. You feel light-headed. You are still sleepy or having trouble with balance after 24 hours. You develop a rash. You have a fever. You have redness or swelling around the IV site. Get help right away if: You have trouble breathing. You have new-onset confusion at home. Summary For several hours after your procedure, you may feel tired. You may also be forgetful and have poor judgment. Have a responsible adult stay with you for the time you are told. It is important to have someone help care for you until you are awake and alert. Rest as told. Do not drive or operate machinery. Do not drink alcohol  or take sleeping pills. Get help right away if you have trouble breathing, or if you suddenly become confused. This information is not intended to replace advice given to you by your health care provider. Make sure you discuss any questions you have with your healthcare provider. Document Revised: 11/05/2019 Document Reviewed: 01/22/2019 Elsevier Patient Education  2022 [place]. How to Use Chlorhexidine for Bathing Chlorhexidine gluconate (CHG) is a germ-killing (antiseptic) solution that is used to clean the skin. It can get rid of the bacteria that normally live on the skin and can keep them away for about 24 hours. To clean your skin with CHG, you may be given: A CHG solution to use in the shower or as part of a sponge bath. A prepackaged cloth that contains CHG. Cleaning your skin with CHG may help lower the risk for infection: While you are staying in the intensive care unit of the hospital. If you have a vascular access, such as a central line, to provide short-term or long-term access to your veins. If you have a catheter to drain urine from your bladder. If you are on a ventilator. A ventilator is a machine that helps you breathe by moving air in and out of your lungs. After surgery. What are the risks? Risks of using CHG include: A skin reaction. Hearing loss, if CHG gets in your ears. Eye injury, if CHG gets in your eyes and is not rinsed out. The CHG product catching fire. Make sure that you avoid smoking and flames after applying CHG to your skin. Do not use CHG: If you have a chlorhexidine allergy or have previously reacted to chlorhexidine. On babies younger than 59 months of age. How to use CHG solution Use CHG only as told by your health care provider, and follow the instructions on the label. Use the full amount of CHG as directed. Usually, this is one bottle. During a shower Follow these steps when using CHG solution during a shower (unless your health care  provider gives you different instructions): Start the shower. Use your normal soap and shampoo to wash your face and hair. Turn off the shower or move out of the shower stream. Pour the CHG onto a clean washcloth. Do not use any type of brush or rough-edged sponge. Starting at your neck, lather your body down to your toes. Make sure you follow these instructions: If you will be having surgery, pay special attention to the part of your body where you will be having surgery. Scrub this area for at least  1 minute. Do not use CHG on your head or face. If the solution gets into your ears or eyes, rinse them well with water. Avoid your genital area. Avoid any areas of skin that have broken skin, cuts, or scrapes. Scrub your back and under your arms. Make sure to wash skin folds. Let the lather sit on your skin for 1-2 minutes or as long as told by your health care provider. Thoroughly rinse your entire body in the shower. Make sure that all body creases and crevices are rinsed well. Dry off with a clean towel. Do not put any substances on your body afterward--such as powder, lotion, or perfume--unless you are told to do so by your health care provider. Only use lotions that are recommended by the manufacturer. Put on clean clothes or pajamas. If it is the night before your surgery, sleep in clean sheets.  During a sponge bath Follow these steps when using CHG solution during a sponge bath (unless your health care provider gives you different instructions): Use your normal soap and shampoo to wash your face and hair. Pour the CHG onto a clean washcloth. Starting at your neck, lather your body down to your toes. Make sure you follow these instructions: If you will be having surgery, pay special attention to the part of your body where you will be having surgery. Scrub this area for at least 1 minute. Do not use CHG on your head or face. If the solution gets into your ears or eyes, rinse them well with  water. Avoid your genital area. Avoid any areas of skin that have broken skin, cuts, or scrapes. Scrub your back and under your arms. Make sure to wash skin folds. Let the lather sit on your skin for 1-2 minutes or as long as told by your health care provider. Using a different clean, wet washcloth, thoroughly rinse your entire body. Make sure that all body creases and crevices are rinsed well. Dry off with a clean towel. Do not put any substances on your body afterward--such as powder, lotion, or perfume--unless you are told to do so by your health care provider. Only use lotions that are recommended by the manufacturer. Put on clean clothes or pajamas. If it is the night before your surgery, sleep in clean sheets. How to use CHG prepackaged cloths Only use CHG cloths as told by your health care provider, and follow the instructions on the label. Use the CHG cloth on clean, dry skin. Do not use the CHG cloth on your head or face unless your health care provider tells you to. When washing with the CHG cloth: Avoid your genital area. Avoid any areas of skin that have broken skin, cuts, or scrapes. Before surgery Follow these steps when using a CHG cloth to clean before surgery (unless your health care provider gives you different instructions): Using the CHG cloth, vigorously scrub the part of your body where you will be having surgery. Scrub using a back-and-forth motion for 3 minutes. The area on your body should be completely wet with CHG when you are done scrubbing. Do not rinse. Discard the cloth and let the area air-dry. Do not put any substances on the area afterward, such as powder, lotion, or perfume. Put on clean clothes or pajamas. If it is the night before your surgery, sleep in clean sheets.  For general bathing Follow these steps when using CHG cloths for general bathing (unless your health care provider gives you different instructions). Use a  separate CHG cloth for each area  of your body. Make sure you wash between any folds of skin and between your fingers and toes. Wash your body in the following order, switching to a new cloth after each step: The front of your neck, shoulders, and chest. Both of your arms, under your arms, and your hands. Your stomach and groin area, avoiding the genitals. Your right leg and foot. Your left leg and foot. The back of your neck, your back, and your buttocks. Do not rinse. Discard the cloth and let the area air-dry. Do not put any substances on your body afterward--such as powder, lotion, or perfume--unless you are told to do so by your health care provider. Only use lotions that are recommended by the manufacturer. Put on clean clothes or pajamas. Contact a health care provider if: Your skin gets irritated after scrubbing. You have questions about using your solution or cloth. Get help right away if: Your eyes become very red or swollen. Your eyes itch badly. Your skin itches badly and is red or swollen. Your hearing changes. You have trouble seeing. You have swelling or tingling in your mouth or throat. You have trouble breathing. You swallow any chlorhexidine. Summary Chlorhexidine gluconate (CHG) is a germ-killing (antiseptic) solution that is used to clean the skin. Cleaning your skin with CHG may help to lower your risk for infection. You may be given CHG to use for bathing. It may be in a bottle or in a prepackaged cloth to use on your skin. Carefully follow your health care provider's instructions and the instructions on the product label. Do not use CHG if you have a chlorhexidine allergy. Contact your health care provider if your skin gets irritated after scrubbing. This information is not intended to replace advice given to you by your health care provider. Make sure you discuss any questions you have with your healthcare provider. Document Revised: 07/03/2019 Document Reviewed: 08/07/2019 Elsevier Patient  Education  [place].

## 2020-09-12 ENCOUNTER — Encounter (HOSPITAL_COMMUNITY): Payer: Self-pay

## 2020-09-12 ENCOUNTER — Encounter (HOSPITAL_COMMUNITY)
Admission: RE | Admit: 2020-09-12 | Discharge: 2020-09-12 | Disposition: A | Discharge status: Home / Self Care | Payer: 59 | Source: Ambulatory Visit | Attending: General Surgery | Admitting: General Surgery

## 2020-09-12 ENCOUNTER — Other Ambulatory Visit: Payer: Self-pay

## 2020-09-12 DIAGNOSIS — E119 Type 2 diabetes mellitus without complications: Secondary | ICD-10-CM | POA: Diagnosis not present

## 2020-09-12 DIAGNOSIS — Z01812 Encounter for preprocedural laboratory examination: Secondary | ICD-10-CM | POA: Insufficient documentation

## 2020-09-12 HISTORY — DX: Cardiac arrhythmia, unspecified: I49.9

## 2020-09-12 LAB — BASIC METABOLIC PANEL
Anion gap: 9 (ref 5–15)
BUN: 20 mg/dL (ref 8–23)
CO2: 23 mmol/L (ref 22–32)
Calcium: 9.2 mg/dL (ref 8.9–10.3)
Chloride: 106 mmol/L (ref 98–111)
Creatinine, Ser: 1.11 mg/dL — ABNORMAL HIGH (ref 0.44–1.00)
GFR, Estimated: 56 mL/min — ABNORMAL LOW (ref >60)
Glucose, Bld: 113 mg/dL — ABNORMAL HIGH (ref 70–99)
Potassium: 4.3 mmol/L (ref 3.5–5.1)
Sodium: 138 mmol/L (ref 135–145)

## 2020-09-12 LAB — HEMOGLOBIN A1C
Hgb A1c MFr Bld: 6 % — ABNORMAL HIGH (ref 4.8–5.6)
Mean Plasma Glucose: 125.5 mg/dL

## 2020-09-14 ENCOUNTER — Encounter (HOSPITAL_COMMUNITY): Payer: Self-pay | Admitting: General Surgery

## 2020-09-14 ENCOUNTER — Encounter (HOSPITAL_COMMUNITY)
Admission: RE | Disposition: A | Discharge status: Home / Self Care | Payer: Self-pay | Source: Home / Self Care | Attending: General Surgery

## 2020-09-14 ENCOUNTER — Ambulatory Visit (HOSPITAL_COMMUNITY): Payer: 59 | Admitting: Certified Registered Nurse Anesthetist

## 2020-09-14 ENCOUNTER — Ambulatory Visit (HOSPITAL_COMMUNITY)
Admission: RE | Admit: 2020-09-14 | Discharge: 2020-09-14 | Disposition: A | Discharge status: Home / Self Care | Payer: 59 | Attending: General Surgery | Admitting: General Surgery

## 2020-09-14 ENCOUNTER — Other Ambulatory Visit: Payer: Self-pay

## 2020-09-14 DIAGNOSIS — N6032 Fibrosclerosis of left breast: Secondary | ICD-10-CM | POA: Insufficient documentation

## 2020-09-14 DIAGNOSIS — Z7984 Long term (current) use of oral hypoglycemic drugs: Secondary | ICD-10-CM | POA: Diagnosis not present

## 2020-09-14 DIAGNOSIS — N641 Fat necrosis of breast: Secondary | ICD-10-CM | POA: Diagnosis not present

## 2020-09-14 DIAGNOSIS — Z87891 Personal history of nicotine dependence: Secondary | ICD-10-CM | POA: Insufficient documentation

## 2020-09-14 DIAGNOSIS — Z7901 Long term (current) use of anticoagulants: Secondary | ICD-10-CM | POA: Diagnosis not present

## 2020-09-14 DIAGNOSIS — Z803 Family history of malignant neoplasm of breast: Secondary | ICD-10-CM | POA: Diagnosis not present

## 2020-09-14 DIAGNOSIS — Z853 Personal history of malignant neoplasm of breast: Secondary | ICD-10-CM | POA: Insufficient documentation

## 2020-09-14 DIAGNOSIS — N6320 Unspecified lump in the left breast, unspecified quadrant: Secondary | ICD-10-CM

## 2020-09-14 DIAGNOSIS — Z9012 Acquired absence of left breast and nipple: Secondary | ICD-10-CM | POA: Insufficient documentation

## 2020-09-14 DIAGNOSIS — Z98891 History of uterine scar from previous surgery: Secondary | ICD-10-CM | POA: Insufficient documentation

## 2020-09-14 DIAGNOSIS — N6082 Other benign mammary dysplasias of left breast: Secondary | ICD-10-CM | POA: Diagnosis not present

## 2020-09-14 DIAGNOSIS — Z79899 Other long term (current) drug therapy: Secondary | ICD-10-CM | POA: Insufficient documentation

## 2020-09-14 HISTORY — PX: BREAST BIOPSY: SHX20

## 2020-09-14 LAB — GLUCOSE, CAPILLARY
Glucose-Capillary: 116 mg/dL — ABNORMAL HIGH (ref 70–99)
Glucose-Capillary: 123 mg/dL — ABNORMAL HIGH (ref 70–99)

## 2020-09-14 SURGERY — BREAST BIOPSY
Anesthesia: General | Site: Breast | Laterality: Left

## 2020-09-14 MED ORDER — BUPIVACAINE HCL (PF) 0.5 % IJ SOLN
INTRAMUSCULAR | Status: AC
  Filled 2020-09-14: qty 30

## 2020-09-14 MED ORDER — ORAL CARE MOUTH RINSE
15.0000 mL | Freq: Once | OROMUCOSAL | Status: AC
  Administered 2020-09-14: 15 mL via OROMUCOSAL

## 2020-09-14 MED ORDER — SUCCINYLCHOLINE CHLORIDE 200 MG/10ML IV SOSY
PREFILLED_SYRINGE | INTRAVENOUS | Status: AC
  Filled 2020-09-14: qty 10

## 2020-09-14 MED ORDER — HYDROCODONE-ACETAMINOPHEN 5-325 MG PO TABS: 1.0000 | ORAL_TABLET | ORAL | 0 refills | Status: DC | PRN

## 2020-09-14 MED ORDER — LIDOCAINE HCL (PF) 2 % IJ SOLN
INTRAMUSCULAR | Status: AC
  Filled 2020-09-14: qty 5

## 2020-09-14 MED ORDER — PROPOFOL 500 MG/50ML IV EMUL
INTRAVENOUS | Status: DC | PRN
  Administered 2020-09-14: 25 ug/kg/min via INTRAVENOUS

## 2020-09-14 MED ORDER — CHLORHEXIDINE GLUCONATE 0.12 % MT SOLN: 15.0000 mL | Freq: Once | OROMUCOSAL | Status: AC

## 2020-09-14 MED ORDER — ONDANSETRON HCL 4 MG/2ML IJ SOLN
INTRAMUSCULAR | Status: AC
  Filled 2020-09-14: qty 2

## 2020-09-14 MED ORDER — CHLORHEXIDINE GLUCONATE CLOTH 2 % EX PADS: 6.0000 | MEDICATED_PAD | Freq: Once | CUTANEOUS | Status: DC

## 2020-09-14 MED ORDER — LIDOCAINE HCL (PF) 1 % IJ SOLN
INTRAMUSCULAR | Status: DC | PRN
  Administered 2020-09-14: 10 mL

## 2020-09-14 MED ORDER — LIDOCAINE HCL (PF) 1 % IJ SOLN
INTRAMUSCULAR | Status: AC
  Filled 2020-09-14: qty 30

## 2020-09-14 MED ORDER — SODIUM CHLORIDE 0.9 % IR SOLN
Status: DC | PRN
  Administered 2020-09-14: 1000 mL

## 2020-09-14 MED ORDER — LIDOCAINE HCL (CARDIAC) PF 100 MG/5ML IV SOSY
PREFILLED_SYRINGE | INTRAVENOUS | Status: DC | PRN
  Administered 2020-09-14: 80 mg via INTRATRACHEAL

## 2020-09-14 MED ORDER — MIDAZOLAM HCL 2 MG/2ML IJ SOLN
INTRAMUSCULAR | Status: DC | PRN
  Administered 2020-09-14: 2 mg via INTRAVENOUS

## 2020-09-14 MED ORDER — ONDANSETRON HCL 4 MG/2ML IJ SOLN: 4.0000 mg | Freq: Once | INTRAMUSCULAR | Status: DC | PRN

## 2020-09-14 MED ORDER — KETAMINE HCL 10 MG/ML IJ SOLN
INTRAMUSCULAR | Status: DC | PRN
  Administered 2020-09-14: 20 mg via INTRAVENOUS

## 2020-09-14 MED ORDER — LACTATED RINGERS IV SOLN: INTRAVENOUS | Status: DC

## 2020-09-14 MED ORDER — FENTANYL CITRATE (PF) 100 MCG/2ML IJ SOLN
INTRAMUSCULAR | Status: DC | PRN
  Administered 2020-09-14 (×2): 25 ug via INTRAVENOUS

## 2020-09-14 MED ORDER — CEFAZOLIN SODIUM-DEXTROSE 2-4 GM/100ML-% IV SOLN
INTRAVENOUS | Status: AC
  Filled 2020-09-14: qty 100

## 2020-09-14 MED ORDER — CEFAZOLIN SODIUM-DEXTROSE 1-4 GM/50ML-% IV SOLN
INTRAVENOUS | Status: AC
  Filled 2020-09-14: qty 50

## 2020-09-14 MED ORDER — PROPOFOL 10 MG/ML IV BOLUS
INTRAVENOUS | Status: DC | PRN
  Administered 2020-09-14: 50 mg via INTRAVENOUS
  Administered 2020-09-14: 20 mg via INTRAVENOUS

## 2020-09-14 MED ORDER — FENTANYL CITRATE (PF) 100 MCG/2ML IJ SOLN: 25.0000 ug | INTRAMUSCULAR | Status: DC | PRN

## 2020-09-14 MED ORDER — KETOROLAC TROMETHAMINE 30 MG/ML IJ SOLN: 30.0000 mg | Freq: Once | INTRAMUSCULAR | Status: DC

## 2020-09-14 MED ORDER — MIDAZOLAM HCL 2 MG/2ML IJ SOLN
INTRAMUSCULAR | Status: AC
  Filled 2020-09-14: qty 2

## 2020-09-14 MED ORDER — GLYCOPYRROLATE 0.2 MG/ML IJ SOLN
INTRAMUSCULAR | Status: DC | PRN
  Administered 2020-09-14: .1 mg via INTRAVENOUS

## 2020-09-14 MED ORDER — CEFAZOLIN IN SODIUM CHLORIDE 3-0.9 GM/100ML-% IV SOLN
3.0000 g | INTRAVENOUS | Status: AC
  Administered 2020-09-14: 3 mg via INTRAVENOUS

## 2020-09-14 MED ORDER — FENTANYL CITRATE (PF) 250 MCG/5ML IJ SOLN
INTRAMUSCULAR | Status: AC
  Filled 2020-09-14: qty 5

## 2020-09-14 MED ORDER — KETAMINE HCL 50 MG/5ML IJ SOSY
PREFILLED_SYRINGE | INTRAMUSCULAR | Status: AC
  Filled 2020-09-14: qty 5

## 2020-09-14 SURGICAL SUPPLY — 32 items
ADH SKN CLS APL DERMABOND .7 (GAUZE/BANDAGES/DRESSINGS) ×1
APL PRP STRL LF DISP 70% ISPRP (MISCELLANEOUS) ×1
BLADE SURG 15 STRL LF DISP TIS (BLADE) ×1 IMPLANT
BLADE SURG 15 STRL SS (BLADE) ×2
CHLORAPREP W/TINT 26 (MISCELLANEOUS) ×2 IMPLANT
CLOTH BEACON ORANGE TIMEOUT ST (SAFETY) ×2 IMPLANT
COVER LIGHT HANDLE STERIS (MISCELLANEOUS) ×4 IMPLANT
DECANTER SPIKE VIAL GLASS SM (MISCELLANEOUS) ×4 IMPLANT
DERMABOND ADVANCED (GAUZE/BANDAGES/DRESSINGS) ×1
DERMABOND ADVANCED .7 DNX12 (GAUZE/BANDAGES/DRESSINGS) ×1 IMPLANT
ELECT REM PT RETURN 9FT ADLT (ELECTROSURGICAL) ×2
ELECTRODE REM PT RTRN 9FT ADLT (ELECTROSURGICAL) ×1 IMPLANT
GAUZE 4X4 16PLY ~~LOC~~+RFID DBL (SPONGE) ×2 IMPLANT
GLOVE SRG 8 PF TXTR STRL LF DI (GLOVE) ×1 IMPLANT
GLOVE SURG POLYISO LF SZ8 (GLOVE) ×2 IMPLANT
GLOVE SURG SS PI 7.5 STRL IVOR (GLOVE) ×2 IMPLANT
GLOVE SURG UNDER POLY LF SZ7 (GLOVE) ×4 IMPLANT
GLOVE SURG UNDER POLY LF SZ8 (GLOVE) ×2
GOWN STRL REUS W/TWL LRG LVL3 (GOWN DISPOSABLE) ×4 IMPLANT
GOWN STRL REUS W/TWL XL LVL3 (GOWN DISPOSABLE) ×2 IMPLANT
KIT TURNOVER KIT A (KITS) ×2 IMPLANT
MANIFOLD NEPTUNE II (INSTRUMENTS) ×2 IMPLANT
NEEDLE HYPO 25X1 1.5 SAFETY (NEEDLE) ×2 IMPLANT
NS IRRIG 1000ML POUR BTL (IV SOLUTION) ×2 IMPLANT
PACK MINOR (CUSTOM PROCEDURE TRAY) ×2 IMPLANT
PAD ARMBOARD 7.5X6 YLW CONV (MISCELLANEOUS) ×2 IMPLANT
SET BASIN LINEN APH (SET/KITS/TRAYS/PACK) ×2 IMPLANT
SUT MNCRL AB 4-0 PS2 18 (SUTURE) ×2 IMPLANT
SUT SILK 2 0 SH (SUTURE) ×2 IMPLANT
SUT VIC AB 3-0 SH 27 (SUTURE) ×2
SUT VIC AB 3-0 SH 27X BRD (SUTURE) ×1 IMPLANT
SYR CONTROL 10ML LL (SYRINGE) ×2 IMPLANT

## 2020-09-14 NOTE — Anesthesia Postprocedure Evaluation (Signed)
Anesthesia Post Note  Patient: Kaitlyn Hull  Procedure(s) Performed: BREAST BIOPSY (Left: Breast)  Patient location during evaluation: Phase II Anesthesia Type: General Level of consciousness: awake Pain management: pain level controlled Vital Signs Assessment: post-procedure vital signs reviewed and stable Respiratory status: spontaneous breathing and respiratory function stable Cardiovascular status: blood pressure returned to baseline and stable Postop Assessment: no headache and no apparent nausea or vomiting Anesthetic complications: no Comments: Late entry   No notable events documented.   Last Vitals:  Vitals:   09/14/20 0805 09/14/20 0951  BP:  136/69  Pulse: 93 87  Resp: 19 18  Temp: 36.9 C 36.5 C  SpO2: 98% 95%    Last Pain:  Vitals:   09/14/20 0951  TempSrc: Oral  PainSc: 0-No pain                 Louann Sjogren

## 2020-09-14 NOTE — Interval H&P Note (Signed)
History and Physical Interval Note:  09/14/2020 8:40 AM  Kaitlyn Hull  has presented today for surgery, with the diagnosis of Left breast mass Personal history of breast cancer.  The various methods of treatment have been discussed with the patient and family. After consideration of risks, benefits and other options for treatment, the patient has consented to  Procedure(s): BREAST BIOPSY (Left) as a surgical intervention.  The patient's history has been reviewed, patient examined, no change in status, stable for surgery.  I have reviewed the patient's chart and labs.  Questions were answered to the patient's satisfaction.     Aviva Signs

## 2020-09-14 NOTE — Anesthesia Preprocedure Evaluation (Addendum)
Anesthesia Evaluation  Patient identified by MRN, date of birth, ID band Patient awake    Reviewed: Allergy & Precautions, H&P , NPO status , Patient's Chart, lab work & pertinent test results, reviewed documented beta blocker date and time   Airway Mallampati: II  TM Distance: >3 FB Neck ROM: full    Dental no notable dental hx.    Pulmonary neg pulmonary ROS, former smoker,    Pulmonary exam normal breath sounds clear to auscultation       Cardiovascular Exercise Tolerance: Good hypertension, + dysrhythmias Atrial Fibrillation  Rhythm:regular Rate:Normal     Neuro/Psych negative neurological ROS  negative psych ROS   GI/Hepatic Neg liver ROS, GERD  Medicated,  Endo/Other  diabetesHyperthyroidism Morbid obesity  Renal/GU negative Renal ROS  negative genitourinary   Musculoskeletal   Abdominal   Peds  Hematology negative hematology ROS (+)   Anesthesia Other Findings   Reproductive/Obstetrics negative OB ROS                             Anesthesia Physical Anesthesia Plan  ASA: 3  Anesthesia Plan: General   Post-op Pain Management:    Induction:   PONV Risk Score and Plan: Ondansetron  Airway Management Planned:   Additional Equipment:   Intra-op Plan:   Post-operative Plan:   Informed Consent: I have reviewed the patients History and Physical, chart, labs and discussed the procedure including the risks, benefits and alternatives for the proposed anesthesia with the patient or authorized representative who has indicated his/her understanding and acceptance.     Dental Advisory Given  Plan Discussed with: CRNA  Anesthesia Plan Comments:         Anesthesia Quick Evaluation

## 2020-09-14 NOTE — Transfer of Care (Signed)
Immediate Anesthesia Transfer of Care Note  Patient: Kaitlyn Hull  Procedure(s) Performed: BREAST BIOPSY (Left: Breast)  Patient Location: Short Stay  Anesthesia Type:General  Level of Consciousness: awake, alert  and oriented  Airway & Oxygen Therapy: Patient Spontanous Breathing  Post-op Assessment: Report given to RN and Post -op Vital signs reviewed and stable  Post vital signs: Reviewed and stable  Last Vitals:  Vitals Value Taken Time  BP    Temp    Pulse    Resp    SpO2      Last Pain:  Vitals:   09/14/20 0805  TempSrc: Oral  PainSc: 0-No pain         Complications: No notable events documented.

## 2020-09-14 NOTE — Discharge Instructions (Signed)
Restart Eliquis tomorrow, 09/15/20.

## 2020-09-14 NOTE — Op Note (Signed)
Patient:  Kaitlyn Hull  DOB:  1955/12/22  MRN:  [id number]   Preop Diagnosis: Left breast mass, history of left breast carcinoma  Postop Diagnosis: Same  Procedure: Left breast biopsy  Surgeon: Aviva Signs, MD  Anes: MAC  Indications: Patient is a 65 year old white female status post left partial mastectomy with sentinel lymph node biopsy and postoperative radiation therapy in 2007 who presents with a chronically nonhealing wound at the previous incision site.  Given the concern of her history of left breast cancer, the patient now comes to the operating room for left breast biopsy to rule out recurrence of the cancer.  The risks and benefits of the procedure including bleeding and infection were fully explained to the patient, who gave informed consent.  She did stop her Eliquis 2 days prior to the procedure.  Procedure note: The patient was placed in the supine position.  After monitored anesthesia care was given, the left breast was prepped and draped using usual sterile technique with ChloraPrep.  Surgical site confirmation was performed to  1% Xylocaine was used for local anesthesia.  An elliptical incision was made around the previous incision and the open wound.  This was taken down to normal breast tissue in the subcutaneous region.  A short suture was placed superiorly and a long suture placed laterally for orientation purposes.  The specimen was sent to pathology for further examination.  Any bleeding was controlled using Bovie electrocautery.  The subcutaneous layer was reapproximated using a 3-0 Vicryl interrupted suture.  The skin was closed using a 4-0 Monocryl subcuticular suture.  Dermabond was applied.  All tape and needle counts were correct at the end of the procedure.  The patient was awakened and transferred to PACU in stable condition.  Complications: None  EBL: Minimal  Specimen: Left breast biopsy

## 2020-09-15 ENCOUNTER — Encounter (HOSPITAL_COMMUNITY): Payer: Self-pay | Admitting: General Surgery

## 2020-09-16 LAB — SURGICAL PATHOLOGY

## 2020-09-27 ENCOUNTER — Encounter: Payer: Self-pay | Admitting: General Surgery

## 2020-09-27 ENCOUNTER — Other Ambulatory Visit: Payer: Self-pay

## 2020-09-27 ENCOUNTER — Ambulatory Visit (INDEPENDENT_AMBULATORY_CARE_PROVIDER_SITE_OTHER): Payer: 59 | Admitting: General Surgery

## 2020-09-27 VITALS — BP 107/73 | HR 92 | Temp 98.4°F | Resp 14 | Ht 62.0 in | Wt 272.0 lb

## 2020-09-27 DIAGNOSIS — Z09 Encounter for follow-up examination after completed treatment for conditions other than malignant neoplasm: Secondary | ICD-10-CM

## 2020-09-27 NOTE — Progress Notes (Signed)
Subjective:     Kaitlyn Hull  Here for wound check, status post left breast biopsy.  Patient doing well.  A small amount of drainage of clear yellow fluid is coming from the midportion of the wound. Objective:    BP 107/73   Pulse 92   Temp 98.4 F (36.9 C) (Other (Comment))   Resp 14   Ht '5\' 2"'$  (1.575 m)   Wt 272 lb (123.4 kg)   LMP 11/17/2014   SpO2 96%   BMI 49.75 kg/m   General:  alert, cooperative, and no distress  Left breast with slight superficial 5 mm wound separation without purulent drainage. Final pathology reveals a sebaceous cyst with granulomatous changes, no evidence of malignancy is present.     Assessment:    Doing well postoperatively.    Plan:   I told her the wound will heal on its own.  Keep it clean and dry.  Follow-up here as needed.  Patient pleased with results.

## 2020-11-03 ENCOUNTER — Other Ambulatory Visit: Payer: Self-pay

## 2020-11-03 ENCOUNTER — Encounter: Payer: Self-pay | Admitting: General Surgery

## 2020-11-03 ENCOUNTER — Ambulatory Visit (INDEPENDENT_AMBULATORY_CARE_PROVIDER_SITE_OTHER): Payer: 59 | Admitting: General Surgery

## 2020-11-03 VITALS — BP 132/90 | HR 78 | Temp 98.2°F | Resp 16 | Ht 62.0 in | Wt 274.0 lb

## 2020-11-03 DIAGNOSIS — Z09 Encounter for follow-up examination after completed treatment for conditions other than malignant neoplasm: Secondary | ICD-10-CM

## 2020-11-03 NOTE — Progress Notes (Signed)
Subjective:     Kaitlyn Hull  Patient here for follow-up wound check.  She stated recently that part of the wound opened up.  No purulent drainage has been noted.  Is been just clear yellow.  She has been using a Q-tip and peroxide to clean the wound.  She denies any fevers. Objective:    BP 132/90   Pulse 78   Temp 98.2 F (36.8 C) (Other (Comment))   Resp 16   Ht '5\' 2"'$  (1.575 m)   Wt 274 lb (124.3 kg)   LMP 11/17/2014   SpO2 97%   BMI 50.12 kg/m   General:  alert, cooperative, and no distress  Incision line and left breast with a 1.5 cm superficial dehiscence of the incision with no purulent drainage present.  Silver nitrate applied.     Assessment:    Superficial wound dehiscence of left breast incision.  No evidence of infection.    Plan:   Continue keeping wound clean and dry.  May clean it with peroxide as needed.  No need for antibiotic.  Follow-up here in 2 weeks.

## 2020-11-17 ENCOUNTER — Encounter: Payer: Self-pay | Admitting: General Surgery

## 2020-11-17 ENCOUNTER — Ambulatory Visit (INDEPENDENT_AMBULATORY_CARE_PROVIDER_SITE_OTHER): Payer: 59 | Admitting: General Surgery

## 2020-11-17 ENCOUNTER — Other Ambulatory Visit: Payer: Self-pay

## 2020-11-17 VITALS — BP 130/77 | HR 102 | Temp 98.3°F | Resp 14 | Ht 62.0 in | Wt 279.0 lb

## 2020-11-17 DIAGNOSIS — Z09 Encounter for follow-up examination after completed treatment for conditions other than malignant neoplasm: Secondary | ICD-10-CM

## 2020-11-17 NOTE — Progress Notes (Signed)
Subjective:     Kaitlyn Hull  Here for follow-up wound check, status post left partial mastectomy with superficial wound dehiscence.  She states the wound has not been draining any purulent fluid.  Occasional clear yellow fluid is noted.  She has been applying peroxide to the wound.  She occasionally puts Neosporin on it.  It has not changed significantly since I last saw her 2 weeks ago. Objective:    BP 130/77   Pulse (!) 102   Temp 98.3 F (36.8 C) (Other (Comment))   Resp 14   Ht '5\' 2"'$  (1.575 m)   Wt 279 lb (126.6 kg)   LMP 11/17/2014   SpO2 96%   BMI 51.03 kg/m   General:  alert, cooperative, and no distress  Left breast with 1.5 cm superficial dehiscence with a soft eschar present deep in the subcutaneous tissue.  No purulent drainage is noted.  Silver nitrate was applied.     Assessment:    Superficial wound dehiscence of left breast.  No significant change since I last saw her.    Plan:   I told her to stop the peroxide to the wound.  She should clean the wound daily with soap and water then apply Neosporin ointment.  I will see her again in 2 weeks.  Should there be no progression of the wound, I may take her back to the operating room for a wound revision.  She understands and agrees.

## 2020-12-01 ENCOUNTER — Ambulatory Visit (INDEPENDENT_AMBULATORY_CARE_PROVIDER_SITE_OTHER): Payer: 59 | Admitting: General Surgery

## 2020-12-01 ENCOUNTER — Encounter: Payer: Self-pay | Admitting: General Surgery

## 2020-12-01 ENCOUNTER — Other Ambulatory Visit: Payer: Self-pay

## 2020-12-01 VITALS — BP 123/83 | HR 80 | Temp 98.3°F | Resp 18 | Ht 62.0 in | Wt 281.0 lb

## 2020-12-01 DIAGNOSIS — Z09 Encounter for follow-up examination after completed treatment for conditions other than malignant neoplasm: Secondary | ICD-10-CM

## 2020-12-01 NOTE — Progress Notes (Signed)
Subjective:     Kaitlyn Hull  Here for postoperative wound check of left breast incision.  Patient states she thinks it is a little smaller.  Minimal yellow drainage noted.  No purulent drainage.  Has been applying antibiotic ointment. Objective:    BP 123/83   Pulse 80   Temp 98.3 F (36.8 C) (Other (Comment))   Resp 18   Ht 5\' 2"  (1.575 m)   Wt 281 lb (127.5 kg)   LMP 11/17/2014   SpO2 97%   BMI 51.40 kg/m   General:  alert, cooperative, and no distress  Left breast incision with small less than 1 cm superficial wound dehiscence with thin eschar and cream present.     Assessment:    Healing superficial wound dehiscence of the left breast wound.    Plan:   Continue current wound care.  I told the patient to contact me should she want formal revision of that small area.

## 2020-12-26 ENCOUNTER — Other Ambulatory Visit: Payer: 59 | Admitting: Adult Health

## 2021-01-31 ENCOUNTER — Other Ambulatory Visit: Payer: Self-pay

## 2021-01-31 ENCOUNTER — Ambulatory Visit (INDEPENDENT_AMBULATORY_CARE_PROVIDER_SITE_OTHER): Payer: 59 | Admitting: General Surgery

## 2021-01-31 ENCOUNTER — Encounter: Payer: Self-pay | Admitting: General Surgery

## 2021-01-31 VITALS — BP 123/93 | HR 78 | Temp 98.2°F | Resp 18 | Ht 62.0 in | Wt 269.0 lb

## 2021-01-31 DIAGNOSIS — N61 Mastitis without abscess: Secondary | ICD-10-CM | POA: Diagnosis not present

## 2021-01-31 MED ORDER — SULFAMETHOXAZOLE-TRIMETHOPRIM 800-160 MG PO TABS: 1.0000 | ORAL_TABLET | Freq: Two times a day (BID) | ORAL | 0 refills | Status: DC

## 2021-01-31 NOTE — Progress Notes (Signed)
Subjective:     Kaitlyn Hull  Patient presents with increasing redness and swelling in the left breast over the past 3 to 4 days.  She states her wound has opened up along the superior aspect of her breast where her incision was.  She states she did feel warm several days ago.  She has finished her radiation therapy several months earlier. Objective:    BP (!) 123/93   Pulse 78   Temp 98.2 F (36.8 C) (Oral)   Resp 18   Ht 5\' 2"  (1.575 m)   Wt 269 lb (122 kg)   LMP 11/17/2014   SpO2 95%   BMI 49.20 kg/m   General:  Well-developed well-nourished, no acute distress  Left breast with erythema throughout the breast.  No specific abscess cavity is noted.  Her incision site is open with subcutaneous dry tissue present.  No drainage is noted.     Assessment:    Left mastitis    Plan:   Bactrim DS 1 tablet p.o. twice daily x10 days.  Will reevaluate in 2 weeks.  Will need revision of the partial mastectomy incision site once healed.

## 2021-02-07 ENCOUNTER — Other Ambulatory Visit: Payer: Self-pay

## 2021-02-07 ENCOUNTER — Ambulatory Visit (INDEPENDENT_AMBULATORY_CARE_PROVIDER_SITE_OTHER): Payer: 59 | Admitting: General Surgery

## 2021-02-07 ENCOUNTER — Encounter: Payer: Self-pay | Admitting: General Surgery

## 2021-02-07 VITALS — BP 105/70 | HR 83 | Temp 98.9°F | Resp 16 | Ht 62.0 in | Wt 287.0 lb

## 2021-02-07 DIAGNOSIS — N61 Mastitis without abscess: Secondary | ICD-10-CM | POA: Diagnosis not present

## 2021-02-07 DIAGNOSIS — T8131XD Disruption of external operation (surgical) wound, not elsewhere classified, subsequent encounter: Secondary | ICD-10-CM

## 2021-02-07 NOTE — Progress Notes (Signed)
Subjective:     Kaitlyn Hull  Patient here for follow-up wound check.  She states the wound is still draining some clear yellow fluid, but it does feel better. Objective:    BP 105/70   Pulse 83   Temp 98.9 F (37.2 C) (Other (Comment))   Resp 16   Ht 5\' 2"  (1.575 m)   Wt 287 lb (130.2 kg)   LMP 11/17/2014   SpO2 97%   BMI 52.49 kg/m   General:  alert, cooperative, and no distress  Left breast with evidence of radiation dermatitis present.  This seems improved since I last saw her.  The wound is still open at approximately 2-1/2 cm.  It does go down to the subcutaneous tissue.  No purulent drainage is present.     Assessment:    Surgical wound dehiscence of left breast with evidence of acute on chronic mastitis with radiation therapy, left breast cancer    Plan:   We will take patient to the operating room for closure of the left breast wound on 02/20/2021.  The risks and benefits of the procedure including bleeding, infection, and wound breakdown were fully explained to the patient, who gave informed consent.

## 2021-02-13 NOTE — H&P (Signed)
Kaitlyn Hull is an 65 y.o. female.   Chief Complaint: Wound breakdown, left breast HPI: Patient is a 65 year old white female status post a left partial mastectomy in 2007 who presented with a hard nodule along the superior aspect of the left breast.  She underwent excision of the left breast mass in July of this year.  She subsequently had wound breakdown.  We were trying to get it to heal under secondary intention, but it is chronically staying open.  She now presents for debridement and closure of the left breast wound.  Patient is on Eliquis for atrial fibrillation.  Past Medical History:  Diagnosis Date   Atrial fibrillation with RVR ([place]) 06/2013   Breast cancer ([place]) 2007   left   Breast disorder    cancer   Diabetes mellitus    Type II   Dysrhythmia    Hematuria 01/20/2014   Hypertension    Hyperthyroidism 06/2013   Obesity    PMB (postmenopausal bleeding) 07/24/2012   Had 16.1 mm endometrium on [place] will get endo biopsy   Psoriasis    Urinary frequency 01/20/2014    Past Surgical History:  Procedure Laterality Date   BREAST BIOPSY Left 09/14/2020   Procedure: BREAST BIOPSY;  Surgeon: Aviva Signs, MD;  Location: [facility];  Service: General;  Laterality: Left;   BREAST SURGERY  02/20/06   left side    CESAREAN SECTION  1995   HYSTEROSCOPY WITH D & C N/A 08/13/2012   Procedure: DILATATION AND CURETTAGE /HYSTEROSCOPY;  Surgeon: Florian Buff, MD;  Location: [facility];  Service: Gynecology;  Laterality: N/A;    Family History  Problem Relation Age of Onset   Heart failure Father    CAD Father    Diabetes Sister    Other Sister        blood clots   Breast cancer Sister    Alzheimer's disease Mother    Diabetes Sister    Hypertension Maternal Grandmother    Colon cancer Neg Hx    Social History:  reports that she quit smoking about 17 years ago. Her smoking use included cigarettes. She started smoking about 44 years ago. She has a 0.01 pack-year smoking history. She has  never used smokeless tobacco. She reports that she does not drink alcohol and does not use drugs.  Allergies: No Known Allergies  No medications prior to admission.    No results found for this or any previous visit (from the past 48 hour(s)). No results found.  Review of Systems  Constitutional: Negative.   HENT: Negative.    Eyes: Negative.   Respiratory: Negative.    Cardiovascular: Negative.   Gastrointestinal: Negative.   Endocrine: Negative.   Genitourinary: Negative.   Musculoskeletal: Negative.   Skin: Negative.   Allergic/Immunologic: Negative.   Neurological: Negative.   Hematological:  Bruises/bleeds easily.  Psychiatric/Behavioral: Negative.     Last menstrual period 11/17/2014. Physical Exam Vitals reviewed.  Constitutional:      Appearance: Normal appearance. She is obese. She is not ill-appearing.  HENT:     Head: Normocephalic and atraumatic.  Cardiovascular:     Rate and Rhythm: Normal rate and regular rhythm.     Heart sounds: Normal heart sounds. No murmur heard.   No friction rub. No gallop.  Pulmonary:     Effort: Pulmonary effort is normal. No respiratory distress.     Breath sounds: Normal breath sounds. No stridor. No wheezing, rhonchi or rales.  Skin:  General: Skin is warm and dry.  Neurological:     Mental Status: She is alert and oriented to person, place, and time.  Left breast with open wound along the superior aspect of the left breast.  Left breast is somewhat smaller than the right breast with a diffuse redness present.  No dominant masses noted.  Assessment/Plan Impression: Surgical wound breakdown most likely secondary to compromised left breast tissue, status post radiation therapy to the left breast due to breast cancer. Plan: Patient is being brought back to the operating room for debridement and closure of the left breast wound.  The risks and benefits of the procedure including bleeding, infection, and wound breakdown were  fully explained to the patient, who gave informed consent.  She will stop her Eliquis 2 days before the procedure.  Aviva Signs, MD 02/13/2021, 8:55 AM

## 2021-02-14 NOTE — Patient Instructions (Addendum)
Kaitlyn Hull  02/14/2021     @PREFPERIOPPHARMACY @   Your procedure is scheduled on  02/20/2021.   Report to [facility] at  0930 AM.    Call this number if you have problems the morning of surgery:  [phone]   Remember:  Do not eat or drink after midnight.     Your last dose of eliquis should be 02/17/2021.    DO NOT take any medications for diabetes the morning of your procedure.      Take these medicines the morning of surgery with A SIP OF WATER                       diltiazem ,metoprolol.     Do not wear jewelry, make-up or nail polish.  Do not wear lotions, powders, or perfumes, or deodorant.  Do not shave 48 hours prior to surgery.  Men may shave face and neck.  Do not bring valuables to the hospital.  [facility] is not responsible for any belongings or valuables.  Contacts, dentures or bridgework may not be worn into surgery.  Leave your suitcase in the car.  After surgery it may be brought to your room.  For patients admitted to the hospital, discharge time will be determined by your treatment team.  Patients discharged the day of surgery will not be allowed to drive home and must have someone with them for 24 hours.    Special instructions:   DO NOT smoke tobacco or vape for 24 hours before your procedure.  Please read over the following fact sheets that you were given. Coughing and Deep Breathing, Surgical Site Infection Prevention, Anesthesia Post-op Instructions, and Care and Recovery After Surgery      Sutured Wound Care Sutures are stitches that can be used to close wounds. Some stitches break down as they heal (absorbable). Other stitches need to be taken out by your doctor (nonabsorbable). Taking good care of your wound can help to prevent pain and infection. It can also help your wound heal more quickly. Follow instructions from your doctor about how to care for your sutured wound. Supplies needed: Soap and water. A clean, dry  towel. Solution to clean your wound, if needed. A clean gauze or bandage (dressing), if needed. Antibiotic ointment, if told by your doctor. How to care for your sutured wound  Keep the wound fully dry for the first 24 hours or as long as told by your doctor. After 24-48 hours, you may shower or bathe as told by your doctor. Do not soak the wound or put the wound under water until the stitches have been taken out. After the first 24 hours, clean the wound once a day, or as often as your doctor tells you to. Take these steps: Wash and rinse the wound as told by your health care provider. Pat the wound dry with a clean towel. Do not rub the wound. After cleaning the wound, put a thin layer of antibiotic ointment on the wound as told by your doctor. This will help: Prevent infection. Keep the bandage from sticking to the wound. Follow instructions from your doctor about how to change your bandage. Make sure you: Wash your hands with soap and water for at least 20 seconds. If you cannot use soap and water, use hand sanitizer. Change your bandage at least once a day, or as often as told by your doctor. If your dressing gets wet  or dirty, change it. Leavestitches in place for at least 2 weeks. If you have skin glue over your stitches, this should also stay in place for at least 2 weeks. Leave tape strips alone (if you have them) unless you are told to take them off. You may trim the edges of the tape strips if they curl up. Check your wound every day for signs of infection. Watch for: Redness, swelling, or pain. Fluid or blood. New warmth, a rash, or hardness at the wound site. Pus or a bad smell. Have the stitches taken out as told by your doctor. Follow these instructions at home: Medicines Take or apply over-the-counter and prescription medicines only as told by your doctor. If you were prescribed an antibiotic medicine or ointment, take or apply it as told by your doctor. Do not stop using  the antibiotic even if you start to feel better. General instructions Cover your wound with clothes or put sunscreen on when you are outside. Use a sunscreen of at least 30 SPF. Do not scratch or pick at your wound. Avoid stretching your wound. Raise the injured area above the level of your heart while you are sitting or lying down, if possible. Eat a diet that includes protein, vitamin A, and vitamin C. Doing this will help your wound heal. Drink enough fluid to keep your pee (urine) pale yellow. Keep all follow-up visits. Contact a doctor if: You were given a tetanus shot and you have any of the following at the site where the needle went in: Swelling. Very bad pain. Redness. Bleeding. Your wound breaks open. You see something coming out of your wound, such as wood or glass. You have any of these signs of infection in or around your wound: Redness, swelling, or pain. Fluid or blood. Warmth. A new rash. Your wound feels hard. You have a fever. The skin near your wound changes color. You have pain that does not get better with medicine. You get numbness around the wound. Get help right away if: You have very bad swelling or more pain around your wound. You have pus or a bad smell coming from your wound. You have painful lumps near your wound or anywhere on your body. You have a red streak going away from your wound. The wound is on your hand or foot, and: Your fingers or toes look pale or blue. You cannot move a finger or toe as you used to do. You have numbness that spreads down your hand, foot, fingers, or toes. Summary Sutures are stitches that are used to close wounds. Taking good care of your wound can help to prevent pain and infection. Keep the wound fully dry for the first 24 hours or for as long as told by your doctor. After 24-48 hours, you may shower or bathe as told by your doctor. This information is not intended to replace advice given to you by your health care  provider. Make sure you discuss any questions you have with your health care provider. Document Revised: 06/27/2020 Document Reviewed: 06/27/2020 Elsevier Patient Education  2022 [place] Anesthesia, Adult, Care After This sheet gives you information about how to care for yourself after your procedure. Your health care provider may also give you more specific instructions. If you have problems or questions, contact your health care provider. What can I expect after the procedure? After the procedure, the following side effects are common: Pain or discomfort at the IV site. Nausea. Vomiting. Sore throat. Trouble  concentrating. Feeling cold or chills. Feeling weak or tired. Sleepiness and fatigue. Soreness and body aches. These side effects can affect parts of the body that were not involved in surgery. Follow these instructions at home: For the time period you were told by your health care provider:  Rest. Do not participate in activities where you could fall or become injured. Do not drive or use machinery. Do not drink alcohol. Do not take sleeping pills or medicines that cause drowsiness. Do not make important decisions or sign legal documents. Do not take care of children on your own. Eating and drinking Follow any instructions from your health care provider about eating or drinking restrictions. When you feel hungry, start by eating small amounts of foods that are soft and easy to digest (bland), such as toast. Gradually return to your regular diet. Drink enough fluid to keep your urine pale yellow. If you vomit, rehydrate by drinking water, juice, or clear broth. General instructions If you have sleep apnea, surgery and certain medicines can increase your risk for breathing problems. Follow instructions from your health care provider about wearing your sleep device: Anytime you are sleeping, including during daytime naps. While taking prescription pain medicines,  sleeping medicines, or medicines that make you drowsy. Have a responsible adult stay with you for the time you are told. It is important to have someone help care for you until you are awake and alert. Return to your normal activities as told by your health care provider. Ask your health care provider what activities are safe for you. Take over-the-counter and prescription medicines only as told by your health care provider. If you smoke, do not smoke without supervision. Keep all follow-up visits as told by your health care provider. This is important. Contact a health care provider if: You have nausea or vomiting that does not get better with medicine. You cannot eat or drink without vomiting. You have pain that does not get better with medicine. You are unable to pass urine. You develop a skin rash. You have a fever. You have redness around your IV site that gets worse. Get help right away if: You have difficulty breathing. You have chest pain. You have blood in your urine or stool, or you vomit blood. Summary After the procedure, it is common to have a sore throat or nausea. It is also common to feel tired. Have a responsible adult stay with you for the time you are told. It is important to have someone help care for you until you are awake and alert. When you feel hungry, start by eating small amounts of foods that are soft and easy to digest (bland), such as toast. Gradually return to your regular diet. Drink enough fluid to keep your urine pale yellow. Return to your normal activities as told by your health care provider. Ask your health care provider what activities are safe for you. This information is not intended to replace advice given to you by your health care provider. Make sure you discuss any questions you have with your health care provider. Document Revised: 11/05/2019 Document Reviewed: 06/04/2019 Elsevier Patient Education  2022 [employer].

## 2021-02-16 ENCOUNTER — Encounter (HOSPITAL_COMMUNITY): Payer: Self-pay

## 2021-02-16 ENCOUNTER — Other Ambulatory Visit: Payer: Self-pay

## 2021-02-16 ENCOUNTER — Encounter (HOSPITAL_COMMUNITY)
Admission: RE | Admit: 2021-02-16 | Discharge: 2021-02-16 | Disposition: A | Discharge status: Home / Self Care | Payer: 59 | Source: Ambulatory Visit | Attending: General Surgery | Admitting: General Surgery

## 2021-02-16 DIAGNOSIS — Z01812 Encounter for preprocedural laboratory examination: Secondary | ICD-10-CM | POA: Insufficient documentation

## 2021-02-16 DIAGNOSIS — E119 Type 2 diabetes mellitus without complications: Secondary | ICD-10-CM | POA: Diagnosis not present

## 2021-02-16 LAB — BASIC METABOLIC PANEL
Anion gap: 10 (ref 5–15)
BUN: 25 mg/dL — ABNORMAL HIGH (ref 8–23)
CO2: 19 mmol/L — ABNORMAL LOW (ref 22–32)
Calcium: 9.1 mg/dL (ref 8.9–10.3)
Chloride: 107 mmol/L (ref 98–111)
Creatinine, Ser: 1.29 mg/dL — ABNORMAL HIGH (ref 0.44–1.00)
GFR, Estimated: 46 mL/min — ABNORMAL LOW (ref >60)
Glucose, Bld: 188 mg/dL — ABNORMAL HIGH (ref 70–99)
Potassium: 4.3 mmol/L (ref 3.5–5.1)
Sodium: 136 mmol/L (ref 135–145)

## 2021-02-16 LAB — HEMOGLOBIN A1C
Hgb A1c MFr Bld: 6.1 % — ABNORMAL HIGH (ref 4.8–5.6)
Mean Plasma Glucose: 128.37 mg/dL

## 2021-02-20 ENCOUNTER — Encounter (HOSPITAL_COMMUNITY)
Admission: RE | Disposition: A | Discharge status: Home / Self Care | Payer: Self-pay | Source: Home / Self Care | Attending: General Surgery

## 2021-02-20 ENCOUNTER — Ambulatory Visit (HOSPITAL_COMMUNITY): Payer: 59 | Admitting: Anesthesiology

## 2021-02-20 ENCOUNTER — Encounter (HOSPITAL_COMMUNITY): Payer: Self-pay | Admitting: General Surgery

## 2021-02-20 ENCOUNTER — Other Ambulatory Visit: Payer: Self-pay

## 2021-02-20 ENCOUNTER — Ambulatory Visit (HOSPITAL_COMMUNITY)
Admission: RE | Admit: 2021-02-20 | Discharge: 2021-02-20 | Disposition: A | Discharge status: Home / Self Care | Payer: 59 | Attending: General Surgery | Admitting: General Surgery

## 2021-02-20 DIAGNOSIS — E119 Type 2 diabetes mellitus without complications: Secondary | ICD-10-CM

## 2021-02-20 DIAGNOSIS — Z853 Personal history of malignant neoplasm of breast: Secondary | ICD-10-CM | POA: Diagnosis not present

## 2021-02-20 DIAGNOSIS — I4891 Unspecified atrial fibrillation: Secondary | ICD-10-CM | POA: Diagnosis not present

## 2021-02-20 DIAGNOSIS — T8189XA Other complications of procedures, not elsewhere classified, initial encounter: Secondary | ICD-10-CM | POA: Diagnosis not present

## 2021-02-20 DIAGNOSIS — Z87891 Personal history of nicotine dependence: Secondary | ICD-10-CM | POA: Diagnosis not present

## 2021-02-20 DIAGNOSIS — Z9012 Acquired absence of left breast and nipple: Secondary | ICD-10-CM | POA: Diagnosis not present

## 2021-02-20 DIAGNOSIS — Z7901 Long term (current) use of anticoagulants: Secondary | ICD-10-CM | POA: Insufficient documentation

## 2021-02-20 DIAGNOSIS — T8131XA Disruption of external operation (surgical) wound, not elsewhere classified, initial encounter: Secondary | ICD-10-CM | POA: Diagnosis not present

## 2021-02-20 DIAGNOSIS — X58XXXA Exposure to other specified factors, initial encounter: Secondary | ICD-10-CM | POA: Diagnosis not present

## 2021-02-20 DIAGNOSIS — L98499 Non-pressure chronic ulcer of skin of other sites with unspecified severity: Secondary | ICD-10-CM | POA: Diagnosis not present

## 2021-02-20 HISTORY — PX: SECONDARY CLOSURE OF WOUND: SHX6208

## 2021-02-20 LAB — GLUCOSE, CAPILLARY: Glucose-Capillary: 139 mg/dL — ABNORMAL HIGH (ref 70–99)

## 2021-02-20 SURGERY — SECONDARY CLOSURE OF WOUND
Anesthesia: General | Site: Breast | Laterality: Left

## 2021-02-20 MED ORDER — 0.9 % SODIUM CHLORIDE (POUR BTL) OPTIME
TOPICAL | Status: DC | PRN
  Administered 2021-02-20: 12:00:00 600 mL

## 2021-02-20 MED ORDER — LACTATED RINGERS IV SOLN: INTRAVENOUS | Status: DC

## 2021-02-20 MED ORDER — PROPOFOL 500 MG/50ML IV EMUL
INTRAVENOUS | Status: DC | PRN
  Administered 2021-02-20: 50 ug/kg/min via INTRAVENOUS

## 2021-02-20 MED ORDER — PROPOFOL 10 MG/ML IV BOLUS
INTRAVENOUS | Status: AC
  Filled 2021-02-20: qty 20

## 2021-02-20 MED ORDER — FENTANYL CITRATE (PF) 100 MCG/2ML IJ SOLN
INTRAMUSCULAR | Status: DC | PRN
  Administered 2021-02-20 (×4): 25 ug via INTRAVENOUS

## 2021-02-20 MED ORDER — POVIDONE-IODINE 10 % EX OINT
TOPICAL_OINTMENT | CUTANEOUS | Status: AC
  Filled 2021-02-20: qty 1

## 2021-02-20 MED ORDER — KETAMINE HCL 10 MG/ML IJ SOLN
INTRAMUSCULAR | Status: DC | PRN
  Administered 2021-02-20: 20 mg via INTRAVENOUS

## 2021-02-20 MED ORDER — CHLORHEXIDINE GLUCONATE 0.12 % MT SOLN
15.0000 mL | Freq: Once | OROMUCOSAL | Status: AC
  Administered 2021-02-20: 10:00:00 15 mL via OROMUCOSAL
  Filled 2021-02-20: qty 15

## 2021-02-20 MED ORDER — MIDAZOLAM HCL 2 MG/2ML IJ SOLN
INTRAMUSCULAR | Status: DC | PRN
  Administered 2021-02-20: 2 mg via INTRAVENOUS

## 2021-02-20 MED ORDER — HYDROCODONE-ACETAMINOPHEN 5-325 MG PO TABS: 1.0000 | ORAL_TABLET | ORAL | 0 refills | Status: DC | PRN

## 2021-02-20 MED ORDER — CHLORHEXIDINE GLUCONATE CLOTH 2 % EX PADS: 6.0000 | MEDICATED_PAD | Freq: Once | CUTANEOUS | Status: DC

## 2021-02-20 MED ORDER — PROPOFOL 10 MG/ML IV BOLUS
INTRAVENOUS | Status: DC | PRN
  Administered 2021-02-20: 40 mg via INTRAVENOUS

## 2021-02-20 MED ORDER — KETAMINE HCL 50 MG/5ML IJ SOSY
PREFILLED_SYRINGE | INTRAMUSCULAR | Status: AC
  Filled 2021-02-20: qty 5

## 2021-02-20 MED ORDER — CEFAZOLIN IN SODIUM CHLORIDE 3-0.9 GM/100ML-% IV SOLN
3.0000 g | INTRAVENOUS | Status: AC
  Administered 2021-02-20: 11:00:00 3 g via INTRAVENOUS
  Filled 2021-02-20: qty 100

## 2021-02-20 MED ORDER — LIDOCAINE HCL (PF) 2 % IJ SOLN
INTRAMUSCULAR | Status: AC
  Filled 2021-02-20: qty 5

## 2021-02-20 MED ORDER — ORAL CARE MOUTH RINSE: 15.0000 mL | Freq: Once | OROMUCOSAL | Status: AC

## 2021-02-20 MED ORDER — BUPIVACAINE HCL (PF) 0.5 % IJ SOLN
INTRAMUSCULAR | Status: DC | PRN
  Administered 2021-02-20: 10 mL

## 2021-02-20 MED ORDER — FENTANYL CITRATE (PF) 100 MCG/2ML IJ SOLN
INTRAMUSCULAR | Status: AC
  Filled 2021-02-20: qty 2

## 2021-02-20 MED ORDER — MIDAZOLAM HCL 2 MG/2ML IJ SOLN
INTRAMUSCULAR | Status: AC
  Filled 2021-02-20: qty 2

## 2021-02-20 MED ORDER — PHENYLEPHRINE HCL (PRESSORS) 10 MG/ML IV SOLN
INTRAVENOUS | Status: DC | PRN
  Administered 2021-02-20 (×3): 80 ug via INTRAVENOUS

## 2021-02-20 MED ORDER — BUPIVACAINE HCL (PF) 0.5 % IJ SOLN
INTRAMUSCULAR | Status: AC
  Filled 2021-02-20: qty 30

## 2021-02-20 MED ORDER — POVIDONE-IODINE 10 % OINT PACKET
TOPICAL_OINTMENT | CUTANEOUS | Status: DC | PRN
  Administered 2021-02-20: 1 via TOPICAL

## 2021-02-20 MED ORDER — BACITRACIN ZINC 500 UNIT/GM EX OINT
TOPICAL_OINTMENT | CUTANEOUS | Status: AC
  Filled 2021-02-20: qty 0.9

## 2021-02-20 SURGICAL SUPPLY — 28 items
APL PRP STRL LF DISP 70% ISPRP (MISCELLANEOUS)
CHLORAPREP W/TINT 26 (MISCELLANEOUS) IMPLANT
CLOTH BEACON ORANGE TIMEOUT ST (SAFETY) ×3 IMPLANT
COVER LIGHT HANDLE STERIS (MISCELLANEOUS) ×6 IMPLANT
ELECT REM PT RETURN 9FT ADLT (ELECTROSURGICAL) ×3
ELECTRODE REM PT RTRN 9FT ADLT (ELECTROSURGICAL) ×1 IMPLANT
GAUZE SPONGE 4X4 12PLY STRL (GAUZE/BANDAGES/DRESSINGS) ×2 IMPLANT
GAUZE SPONGE 4X4 12PLY STRL LF (GAUZE/BANDAGES/DRESSINGS) ×2 IMPLANT
GLOVE SURG POLYISO LF SZ7.5 (GLOVE) ×3 IMPLANT
GLOVE SURG UNDER POLY LF SZ7 (GLOVE) ×6 IMPLANT
GOWN STRL REUS W/TWL LRG LVL3 (GOWN DISPOSABLE) ×6 IMPLANT
INST SET MAJOR GENERAL (KITS) ×3 IMPLANT
KIT TURNOVER KIT A (KITS) ×3 IMPLANT
MANIFOLD NEPTUNE II (INSTRUMENTS) ×3 IMPLANT
NDL HYPO 25X1 1.5 SAFETY (NEEDLE) ×1 IMPLANT
NEEDLE HYPO 25X1 1.5 SAFETY (NEEDLE) ×3 IMPLANT
PACK MAJOR ABDOMINAL (CUSTOM PROCEDURE TRAY) ×3 IMPLANT
PAD ARMBOARD 7.5X6 YLW CONV (MISCELLANEOUS) ×3 IMPLANT
SET BASIN LINEN APH (SET/KITS/TRAYS/PACK) ×3 IMPLANT
SOL PREP PROV IODINE SCRUB 4OZ (MISCELLANEOUS) IMPLANT
STAPLER VISISTAT (STAPLE) IMPLANT
SUT PROLENE 2 0 FS (SUTURE) ×8 IMPLANT
SUT PROLENE 3 0 PS 1 (SUTURE) IMPLANT
SUT VIC AB 3-0 SH 27 (SUTURE) ×3
SUT VIC AB 3-0 SH 27X BRD (SUTURE) ×1 IMPLANT
SYR CONTROL 10ML LL (SYRINGE) ×3 IMPLANT
TAPE CLOTH SURG 4X10 WHT LF (GAUZE/BANDAGES/DRESSINGS) ×2 IMPLANT
TOWEL OR 17X26 4PK STRL BLUE (TOWEL DISPOSABLE) ×3 IMPLANT

## 2021-02-20 NOTE — Anesthesia Postprocedure Evaluation (Signed)
Anesthesia Post Note  Patient: Kaitlyn Hull  Procedure(s) Performed: SIMPLE WOUND CLOSURE (Left: Breast)  Patient location during evaluation: PACU Anesthesia Type: General Level of consciousness: awake and alert and oriented Pain management: pain level controlled Vital Signs Assessment: post-procedure vital signs reviewed and stable Respiratory status: spontaneous breathing, nonlabored ventilation and respiratory function stable Cardiovascular status: blood pressure returned to baseline and stable Postop Assessment: no apparent nausea or vomiting Anesthetic complications: no   No notable events documented.   Last Vitals:  Vitals:   02/20/21 1215 02/20/21 1230  BP: (!) 98/59   Pulse: 75 83  Resp: 20 20  Temp:    SpO2: 97% 99%    Last Pain:  Vitals:   02/20/21 1215  TempSrc:   PainSc: 0-No pain                 Jaylene Schrom C Craig Ionescu

## 2021-02-20 NOTE — Op Note (Signed)
Patient:  Kaitlyn Hull  DOB:  [birth year]  MRN:  [id number]   Preop Diagnosis: Left breast wound dehiscence   Postop Diagnosis: Same  Procedure: Debridement and closure of left breast wound  Surgeon: Aviva Signs, MD  Anes: MAC  Indications: Patient is a 65 year old white female status post left breast biopsy for a left breast mass, status post left breast cancer treatment in the remote past who presents with dehiscence of the left breast wound.  She has had radiation to the left breast in the past.  She does not have documented recurrence of her breast cancer.  The patient now comes for debridement and closure of the left breast wound.  The risks and benefits of the procedure were fully explained to the patient, who gave informed consent.  Procedure note: The patient was placed in the supine position.  After monitored anesthesia care was given, the left breast was prepped and draped using the usual sterile technique with Betadine.  Surgical site confirmation was performed.  An elliptical incision was made around the wound which measured approximately 2 cm in greatest diameter.  A full-thickness skin and subcutaneous tissue excision was performed to normal tissue.  The tissue was sent to pathology further examination.  A bleeding was controlled using Bovie electrocautery.  The area was instilled with 0.5% Sensorcaine.  The skin was closed using 2-0 Prolene vertical mattress sutures.  Betadine ointment and dry sterile dressing were applied.  All tape and needle counts were correct at the end of the procedure.  The patient was awakened and transferred to PACU in stable condition.  Complications: None  EBL: Minimal  Specimen: Left breast tissue

## 2021-02-20 NOTE — Interval H&P Note (Signed)
History and Physical Interval Note:  02/20/2021 11:15 AM  Kaitlyn Hull  has presented today for surgery, with the diagnosis of Acute mastitis of left breast Surgical wound dehiscence.  The various methods of treatment have been discussed with the patient and family. After consideration of risks, benefits and other options for treatment, the patient has consented to  Procedure(s): SIMPLE WOUND CLOSURE (Left) as a surgical intervention.  The patient's history has been reviewed, patient examined, no change in status, stable for surgery.  I have reviewed the patient's chart and labs.  Questions were answered to the patient's satisfaction.     Aviva Signs

## 2021-02-20 NOTE — Transfer of Care (Signed)
Immediate Anesthesia Transfer of Care Note  Patient: Kaitlyn Hull  Procedure(s) Performed: SIMPLE WOUND CLOSURE (Left: Breast)  Patient Location: Short Stay   Anesthesia Type:General  Level of Consciousness: awake, alert  and oriented  Airway & Oxygen Therapy: Patient Spontanous Breathing  Post-op Assessment: Report given to RN and Post -op Vital signs reviewed and stable  Post vital signs: Reviewed and stable  Last Vitals:  Vitals Value Taken Time  BP    Temp    Pulse    Resp    SpO2      Last Pain:  Vitals:   02/20/21 0951  TempSrc: Oral  PainSc: 0-No pain         Complications: No notable events documented.

## 2021-02-20 NOTE — Anesthesia Preprocedure Evaluation (Signed)
Anesthesia Evaluation  Patient identified by MRN, date of birth, ID band Patient awake    Reviewed: Allergy & Precautions, NPO status , Patient's Chart, lab work & pertinent test results, reviewed documented beta blocker date and time   History of Anesthesia Complications Negative for: history of anesthetic complications  Airway Mallampati: II  TM Distance: >3 FB Neck ROM: Full    Dental  (+) Dental Advisory Given, Missing   Pulmonary former smoker,    Pulmonary exam normal breath sounds clear to auscultation       Cardiovascular Exercise Tolerance: Good hypertension, Pt. on medications and Pt. on home beta blockers Normal cardiovascular exam+ dysrhythmias Atrial Fibrillation  Rhythm:Regular Rate:Normal     Neuro/Psych negative neurological ROS  negative psych ROS   GI/Hepatic GERD  Medicated,  Endo/Other  diabetesHyperthyroidism Morbid obesity  Renal/GU      Musculoskeletal negative musculoskeletal ROS (+)   Abdominal   Peds  Hematology negative hematology ROS (+)   Anesthesia Other Findings   Reproductive/Obstetrics negative OB ROS                            Anesthesia Physical Anesthesia Plan  ASA: 3  Anesthesia Plan: General   Post-op Pain Management: Minimal or no pain anticipated   Induction: Intravenous  PONV Risk Score and Plan: Ondansetron  Airway Management Planned: Nasal Cannula, Natural Airway and Simple Face Mask  Additional Equipment:   Intra-op Plan:   Post-operative Plan:   Informed Consent: I have reviewed the patients History and Physical, chart, labs and discussed the procedure including the risks, benefits and alternatives for the proposed anesthesia with the patient or authorized representative who has indicated his/her understanding and acceptance.     Dental advisory given  Plan Discussed with: CRNA and Surgeon  Anesthesia Plan Comments:          Anesthesia Quick Evaluation

## 2021-02-21 ENCOUNTER — Encounter (HOSPITAL_COMMUNITY): Payer: Self-pay | Admitting: General Surgery

## 2021-02-21 LAB — SURGICAL PATHOLOGY

## 2021-03-14 ENCOUNTER — Other Ambulatory Visit: Payer: Self-pay

## 2021-03-14 ENCOUNTER — Ambulatory Visit (INDEPENDENT_AMBULATORY_CARE_PROVIDER_SITE_OTHER): Payer: 59 | Admitting: General Surgery

## 2021-03-14 ENCOUNTER — Encounter: Payer: Self-pay | Admitting: General Surgery

## 2021-03-14 VITALS — BP 114/75 | HR 79 | Temp 98.4°F | Resp 18 | Ht 62.0 in | Wt 284.0 lb

## 2021-03-14 DIAGNOSIS — Z09 Encounter for follow-up examination after completed treatment for conditions other than malignant neoplasm: Secondary | ICD-10-CM

## 2021-03-15 NOTE — Progress Notes (Signed)
Subjective:     Kaitlyn Hull  Patient here for follow-up, status post reexcision of left breast wound.  She states she had a small L pitting develop along the lateral aspect of the incision line.  No purulent drainage has been noted. Objective:    BP 114/75    Pulse 79    Temp 98.4 F (36.9 C) (Other (Comment))    Resp 18    Ht 5\' 2"  (1.575 m)    Wt 284 lb (128.8 kg)    LMP 11/17/2014    SpO2 98%    BMI 51.94 kg/m   General:  alert, cooperative, and no distress  Left breast wound with the lateral aspect having partially dehisced.  The sutures are still in place.  All sutures were removed.  Steri-Strips were applied after the wound was cleaned and silver nitrate was applied to the subcutaneous tissue.  The dehiscence was less than 1 cm in diameter. Final pathology was negative for malignancy.     Assessment:    Doing well postoperatively. Partial wound dehiscence    Plan:   Follow-up wound check in 2 weeks.

## 2021-03-16 ENCOUNTER — Other Ambulatory Visit: Payer: Self-pay | Admitting: Cardiology

## 2021-03-16 NOTE — Telephone Encounter (Signed)
Prescription refill request for Eliquis received. Indication:  Atrial fib Last office visit: 04/21/20  Zandra Abts MD Scr: 1.29 on 02/16/21 Age: 66 Weight: 127.9kg  Based on above findings Eliquis 5mg  twice daily is the appropriate dose.  Refill approved.

## 2021-03-21 ENCOUNTER — Ambulatory Visit (INDEPENDENT_AMBULATORY_CARE_PROVIDER_SITE_OTHER): Payer: 59 | Admitting: Surgery

## 2021-03-21 ENCOUNTER — Other Ambulatory Visit: Payer: Self-pay

## 2021-03-21 ENCOUNTER — Encounter: Payer: Self-pay | Admitting: Surgery

## 2021-03-21 VITALS — BP 116/75 | HR 74 | Temp 98.6°F | Resp 16 | Ht 62.0 in | Wt 287.0 lb

## 2021-03-21 DIAGNOSIS — T8131XD Disruption of external operation (surgical) wound, not elsewhere classified, subsequent encounter: Secondary | ICD-10-CM

## 2021-03-21 NOTE — Progress Notes (Signed)
[facility] Note   HPI:  66 y.o. Female presents to clinic for follow-up, status post reexcision of left breast wound.  She was seen in clinic on 1/10, and at that time she was noted to have a left lateral wound dehiscence of about 1 cm.  She states that the Steri-Strips fell off within 24 hours of placement.  Since that time, she has been doing well.  She does complain of pain associated with the wound, and she has to support her breast when reaching with her left arm or bending over.  She denies fevers and chills.  She did have an episode of drainage last week, that she described as oatmeal consistency, but otherwise denies purulent or foul-smelling drainage.  Review of Systems:  All other review of systems: otherwise negative   Vital Signs:  BP 116/75    Pulse 74    Temp 98.6 F (37 C) (Other (Comment))    Resp 16    Ht 5\' 2"  (1.575 m)    Wt 287 lb (130.2 kg)    LMP 11/17/2014    SpO2 97%    BMI 52.49 kg/m    Physical Exam:  Physical Exam Constitutional:      General: She is not in acute distress.    Appearance: Normal appearance. She is obese. She is not ill-appearing or toxic-appearing.  Chest:     Comments: Left breast with previous surgical site in the left upper inner quadrant.  Lateral aspect of incision with 1 cm dehiscence, size of dehiscence unchanged from last visit.  Minimal serous drainage noted.  Fibrinous exudate noted at base of wound Neurological:     Mental Status: She is alert.    Laboratory studies: None    Imaging:  None   Assessment:  66 y.o. yo Female with wound dehiscence status post reexcision of left breast wound.  Plan:  -Wound cleansed and silver nitrate reapplied within the wound -Reapplied Steri-Strips with Mastisol -Suspect patient will need daily packing changes to help promote healing of the wound from the inside out, especially given her history of radiation to this site previous history of poor wound healing here -Follow up  with Dr. Arnoldo Morale on 1/26 to discuss further wound management  All of the above recommendations were discussed with the patient, and all of patient's  questions were answered to her expressed satisfaction.  [name] Freer, DO [facility] [address] [place], [place] [address] [phone] (office)

## 2021-03-22 ENCOUNTER — Encounter: Payer: 59 | Admitting: General Surgery

## 2021-03-30 ENCOUNTER — Other Ambulatory Visit: Payer: Self-pay

## 2021-03-30 ENCOUNTER — Encounter: Payer: Self-pay | Admitting: General Surgery

## 2021-03-30 ENCOUNTER — Ambulatory Visit (INDEPENDENT_AMBULATORY_CARE_PROVIDER_SITE_OTHER): Payer: 59 | Admitting: General Surgery

## 2021-03-30 VITALS — BP 122/79 | HR 99 | Temp 98.8°F | Resp 16 | Ht 60.0 in | Wt 288.0 lb

## 2021-03-30 DIAGNOSIS — Z09 Encounter for follow-up examination after completed treatment for conditions other than malignant neoplasm: Secondary | ICD-10-CM

## 2021-03-30 NOTE — Progress Notes (Signed)
Subjective:     Kaitlyn Hull  Here for follow-up wound check.  Patient has had a little bit of drainage from the left breast wound.  No change in the opening is noted.  No fevers have been noted. Objective:    BP 122/79    Pulse 99    Temp 98.8 F (37.1 C) (Other (Comment))    Resp 16    Ht 5' (1.524 m)    Wt 288 lb (130.6 kg)    LMP 11/17/2014    SpO2 96%    BMI 56.25 kg/m   General:  alert, cooperative, and no distress  Left breast opening is approximately 1 cm.  No purulent drainage present.  Silver nitrate applied to granulation tissue at base with some fibrinous exudate present.     Assessment:    Superficial wound dehiscence, left breast    Plan:   We will see the patient back again in 2 weeks.  I may at that point placed some sutures to close the wound as the Steri-Strips are not staying on.  Patient understands and agrees.  She should clean the wound daily with a Q-tip and peroxide.

## 2021-04-12 ENCOUNTER — Encounter: Payer: 59 | Admitting: General Surgery

## 2021-04-12 ENCOUNTER — Other Ambulatory Visit: Payer: Self-pay

## 2021-04-12 ENCOUNTER — Ambulatory Visit (INDEPENDENT_AMBULATORY_CARE_PROVIDER_SITE_OTHER): Payer: 59 | Admitting: General Surgery

## 2021-04-12 ENCOUNTER — Encounter: Payer: Self-pay | Admitting: General Surgery

## 2021-04-12 VITALS — BP 140/76 | HR 110 | Temp 97.9°F | Resp 18 | Ht 62.0 in

## 2021-04-12 DIAGNOSIS — Z09 Encounter for follow-up examination after completed treatment for conditions other than malignant neoplasm: Secondary | ICD-10-CM

## 2021-04-12 NOTE — Progress Notes (Signed)
Subjective:     Kaitlyn Hull  Here for wound check of left breast wound.  States she has had increasing amount of yellowish drainage from the wound.  She has been using Q-tip and peroxide to clean the wound. Objective:    BP 140/76    Pulse (!) 110    Temp 97.9 F (36.6 C) (Other (Comment))    Resp 18    Ht 5\' 2"  (1.575 m)    LMP 11/17/2014    SpO2 96%    BMI 52.68 kg/m   General:  alert, cooperative, and no distress  Left breast wound unchanged from when I last saw him.     Assessment:    Superficial wound dehiscence of left breast incision.    Plan:   I told the patient lets stop the peroxide cleaning and just clean with soap and water.  I would like to have the amount of drainage decreased prior to closing the wound.  She understands and agrees.  Follow-up here in 2 weeks.

## 2021-04-25 ENCOUNTER — Other Ambulatory Visit: Payer: Self-pay

## 2021-04-25 ENCOUNTER — Encounter: Payer: Self-pay | Admitting: General Surgery

## 2021-04-25 ENCOUNTER — Ambulatory Visit (INDEPENDENT_AMBULATORY_CARE_PROVIDER_SITE_OTHER): Payer: 59 | Admitting: General Surgery

## 2021-04-25 VITALS — BP 123/80 | HR 88 | Temp 97.5°F | Resp 12 | Ht 62.0 in | Wt 286.0 lb

## 2021-04-25 DIAGNOSIS — Z09 Encounter for follow-up examination after completed treatment for conditions other than malignant neoplasm: Secondary | ICD-10-CM

## 2021-04-25 NOTE — Progress Notes (Signed)
Subjective:     Kaitlyn Hull  Patient here for follow-up wound check.  She states that the wound has had decreased clear drainage present.  No purulent drainage has been noted. Objective:    BP 123/80    Pulse 88    Temp (!) 97.5 F (36.4 C) (Other (Comment))    Resp 12    Ht 5\' 2"  (1.575 m)    Wt 286 lb (129.7 kg)    LMP 11/17/2014    SpO2 97%    BMI 52.31 kg/m   General:  alert, cooperative, and no distress  Left breast wound slightly smaller than the last time I saw her.  There is hardening soft tissue just beneath the skin.     Assessment:    Partial wound dehiscence, left breast scar.    Plan:   As the wound is getting smaller, will defer closure at this time.  I will see the patient again in 1 month for follow-up.  Continue current wound care.  She understands and agrees to the treatment plan.

## 2021-05-23 ENCOUNTER — Other Ambulatory Visit: Payer: Self-pay

## 2021-05-23 ENCOUNTER — Encounter: Payer: Self-pay | Admitting: General Surgery

## 2021-05-23 ENCOUNTER — Ambulatory Visit: Payer: 59 | Admitting: General Surgery

## 2021-05-23 VITALS — BP 117/79 | HR 87 | Temp 97.6°F | Resp 16 | Ht 62.0 in | Wt 287.0 lb

## 2021-05-23 DIAGNOSIS — S21002D Unspecified open wound of left breast, subsequent encounter: Secondary | ICD-10-CM

## 2021-05-23 NOTE — Progress Notes (Signed)
Subjective:  ?  ? Kaitlyn Hull  ?Here for wound check on the left breast.  No purulent drainage has been noted.  She has noticed some blood at the base of the wound when she is cleaning it out with a Q-tip after showering.  No pain is present. ?Objective:  ? ? BP 117/79   Pulse 87   Temp 97.6 ?F (36.4 ?C) (Other (Comment))   Resp 16   Ht '5\' 2"'$  (1.575 m)   Wt 287 lb (130.2 kg)   LMP 11/17/2014   SpO2 97%   BMI 52.49 kg/m?  ? ?General:  alert, cooperative, and no distress  ?Left breast wound actually looks improved and is smaller than when I last saw her 1 month ago.  No purulent drainage is present.  Minimal granulation tissue is present at the base. ?   ? ?Assessment:  ? ? Slowly healing wound in the left breast, status post left breast biopsy in past.  ?  ?Plan:  ? ?At this point, the wound is slowly improving, thus will not do a surgical revision at this time.  Patient understands and agrees.  Follow-up here in 2 months for wound check.  She was instructed to return sooner should there be any worsening. ?

## 2021-07-14 ENCOUNTER — Ambulatory Visit: Payer: 59 | Admitting: Cardiology

## 2021-07-14 ENCOUNTER — Encounter: Payer: Self-pay | Admitting: Cardiology

## 2021-07-14 VITALS — BP 126/80 | HR 120 | Ht 62.0 in | Wt 286.2 lb

## 2021-07-14 DIAGNOSIS — I482 Chronic atrial fibrillation, unspecified: Secondary | ICD-10-CM | POA: Diagnosis not present

## 2021-07-14 DIAGNOSIS — E782 Mixed hyperlipidemia: Secondary | ICD-10-CM | POA: Diagnosis not present

## 2021-07-14 DIAGNOSIS — I10 Essential (primary) hypertension: Secondary | ICD-10-CM

## 2021-07-14 DIAGNOSIS — R0600 Dyspnea, unspecified: Secondary | ICD-10-CM | POA: Diagnosis not present

## 2021-07-14 NOTE — Patient Instructions (Signed)
Medication Instructions:  Your physician recommends that you continue on your current medications as directed. Please refer to the Current Medication list given to you today.   Labwork: none  Testing/Procedures: Your physician has requested that you have an echocardiogram. Echocardiography is a painless test that uses sound waves to create images of your heart. It provides your doctor with information about the size and shape of your heart and how well your heart's chambers and valves are working. This procedure takes approximately one hour. There are no restrictions for this procedure.   Follow-Up:  Your physician recommends that you schedule a follow-up appointment in: Follow Up Pending   Any Other Special Instructions Will Be Listed Below (If Applicable).  If you need a refill on your cardiac medications before your next appointment, please call your pharmacy.  

## 2021-07-14 NOTE — Progress Notes (Signed)
? ? ? ?Clinical Summary ?Kaitlyn Hull is a 66 y.o.female seen today for follow up of the following medical problems.  ? ? ?1. Permanent afib ? ?- no palpitations ?- compliant with meds ?- no bleeding on eliquis.  ?  ?  ?2. HTN ?-she is compliant with meds ?  ?3. Hyperlipidemia ?- 11/2019 TC 185 HDL 78 LDL 82 TG 147 ? - 11/2020 TC 173 TG 132 HDL 73 LDL 77 ? ?4. Fatigue ?- feels drained with activities, some related SOB.  ?- no recent edema. No chest pains.  ?  ?Social history: Her husband of 75 years passed away in 2016/06/16 from COPD.  Her daughter lives in [place] and she has a sister in [place]. ? ? ?Past Medical History:  ?Diagnosis Date  ? Atrial fibrillation with RVR ([place]) 06/2013  ? Breast cancer ([place]) 06/16/2005  ? left  ? Breast disorder   ? cancer  ? Diabetes mellitus   ? Type II  ? Dysrhythmia   ? Hematuria 01/20/2014  ? Hypertension   ? Hyperthyroidism 06/2013  ? Obesity   ? PMB (postmenopausal bleeding) 07/24/2012  ? Had 16.1 mm endometrium on [place] will get endo biopsy  ? Psoriasis   ? Urinary frequency 01/20/2014  ? ? ? ?No Known Allergies ? ? ?Current Outpatient Medications  ?Medication Sig Dispense Refill  ? acetaminophen (TYLENOL) 325 MG tablet Take 650 mg by mouth every 6 (six) hours as needed for moderate pain.    ? apixaban (ELIQUIS) 5 MG TABS tablet Take 1 tablet by mouth twice daily 180 tablet 1  ? diltiazem (CARDIZEM CD) 120 MG 24 hr capsule Take 1 capsule by mouth twice daily 180 capsule 1  ? HYDROcodone-acetaminophen (NORCO) 5-325 MG tablet Take 1 tablet by mouth every 4 (four) hours as needed for moderate pain. 30 tablet 0  ? ibuprofen (ADVIL) 200 MG tablet Take 400 mg by mouth every 6 (six) hours as needed for moderate pain.    ? lisinopril (ZESTRIL) 2.5 MG tablet Take 2.5 mg by mouth daily.    ? metFORMIN (GLUCOPHAGE-XR) 500 MG 24 hr tablet Take 500 mg by mouth See admin instructions. Take 500 mg by mouth in the morning and 1000 mg at bedtime    ? metoprolol tartrate (LOPRESSOR) 50 MG tablet  Take 1 tablet by mouth twice daily 180 tablet 1  ? Multiple Vitamin (MULTIVITAMIN) tablet Take 1 tablet by mouth daily.    ? pravastatin (PRAVACHOL) 40 MG tablet Take 40 mg by mouth at bedtime.    ? ?No current facility-administered medications for this visit.  ? ? ? ?Past Surgical History:  ?Procedure Laterality Date  ? BREAST BIOPSY Left 09/14/2020  ? Procedure: BREAST BIOPSY;  Surgeon: Aviva Signs, MD;  Location: [facility];  Service: General;  Laterality: Left;  ? BREAST SURGERY  02/20/06  ? left side   ? [place]  ? HYSTEROSCOPY WITH D & C N/A 08/13/2012  ? Procedure: DILATATION AND CURETTAGE /HYSTEROSCOPY;  Surgeon: Florian Buff, MD;  Location: [facility];  Service: Gynecology;  Laterality: N/A;  ? SECONDARY CLOSURE OF WOUND Left 02/20/2021  ? Procedure: SIMPLE WOUND CLOSURE;  Surgeon: Aviva Signs, MD;  Location: [facility];  Service: General;  Laterality: Left;  ? ? ? ?No Known Allergies ? ? ? ?Family History  ?Problem Relation Age of Onset  ? Heart failure Father   ? CAD Father   ? Diabetes Sister   ? Other Sister   ?  blood clots  ? Breast cancer Sister   ? Alzheimer's disease Mother   ? Diabetes Sister   ? Hypertension Maternal Grandmother   ? Colon cancer Neg Hx   ? ? ? ?Social History ?Kaitlyn Hull reports that she quit smoking about 18 years ago. Her smoking use included cigarettes. She started smoking about 45 years ago. She has a 0.01 pack-year smoking history. She has never used smokeless tobacco. ?Kaitlyn Hull reports no history of alcohol use. ? ? ?Review of Systems ?CONSTITUTIONAL: No weight loss, fever, chills, weakness or fatigue.  ?HEENT: Eyes: No visual loss, blurred vision, double vision or yellow sclerae.No hearing loss, sneezing, congestion, runny nose or sore throat.  ?SKIN: No rash or itching.  ?CARDIOVASCULAR: per hpi ?RESPIRATORY: No shortness of breath, cough or sputum.  ?GASTROINTESTINAL: No anorexia, nausea, vomiting or diarrhea. No abdominal pain or blood.  ?GENITOURINARY: No  burning on urination, no polyuria ?NEUROLOGICAL: No headache, dizziness, syncope, paralysis, ataxia, numbness or tingling in the extremities. No change in bowel or bladder control.  ?MUSCULOSKELETAL: No muscle, back pain, joint pain or stiffness.  ?LYMPHATICS: No enlarged nodes. No history of splenectomy.  ?PSYCHIATRIC: No history of depression or anxiety.  ?ENDOCRINOLOGIC: No reports of sweating, cold or heat intolerance. No polyuria or polydipsia.  ?. ? ? ?Physical Examination ?Today's Vitals  ? 07/14/21 1332  ?BP: 126/80  ?Pulse: (!) 120  ?SpO2: 99%  ?Weight: 286 lb 3.2 oz (129.8 kg)  ?Height: '5\' 2"'$  (1.575 m)  ? ?Body mass index is 52.35 kg/m?. ? ?Gen: resting comfortably, no acute distress ?HEENT: no scleral icterus, pupils equal round and reactive, no palptable cervical adenopathy,  ?CV: RRR, no m/r/g no jvd ?Resp: Clear to auscultation bilaterally ?GI: abdomen is soft, non-tender, non-distended, normal bowel sounds, no hepatosplenomegaly ?MSK: extremities are warm, no edema.  ?Skin: warm, no rash ?Neuro:  no focal deficits ?Psych: appropriate affect ? ? ? ? ? ?Assessment and Plan  ? ?1. Afib/acquired thrombophilia ?-no symptoms ?- HR elevated by dynamap by controlled by EKG, continue curernt meds ?- continue eliquis for stroke prevention ?  ?2. HTN ?-at goal, continue current meds ?  ?3. Hyperlipidemia ?- at goal, continue current meds ? ?4. Dyspnea ?- dyspnea and fatigue with actitivies. Unclear if could be solely deconditioning, obesity related. Will update echo to evaluate for any new cardiac dysfunction ? ? ? ? ? ? ? ?Arnoldo Lenis, M.D. ?

## 2021-07-18 ENCOUNTER — Ambulatory Visit (INDEPENDENT_AMBULATORY_CARE_PROVIDER_SITE_OTHER): Payer: 59

## 2021-07-18 ENCOUNTER — Ambulatory Visit: Payer: 59 | Admitting: General Surgery

## 2021-07-18 DIAGNOSIS — R0600 Dyspnea, unspecified: Secondary | ICD-10-CM | POA: Diagnosis not present

## 2021-07-18 LAB — ECHOCARDIOGRAM COMPLETE
Calc EF: 54 %
MV M vel: 4.06 m/s
MV Peak grad: 65.9 mmHg
S' Lateral: 2.71 cm
Single Plane A2C EF: 50.3 %
Single Plane A4C EF: 56.1 %

## 2021-09-27 ENCOUNTER — Other Ambulatory Visit: Payer: Self-pay | Admitting: Cardiology

## 2021-09-27 DIAGNOSIS — I482 Chronic atrial fibrillation, unspecified: Secondary | ICD-10-CM

## 2021-09-27 NOTE — Telephone Encounter (Signed)
Prescription refill request for Eliquis received. Indication: Afib  Last office visit: 07/14/21 Scr: 1.29 (02/16/21) Age: 66 Weight: 129.8kg  Appropriate dose and refill sent to requested pharmacy.

## 2021-10-20 ENCOUNTER — Telehealth: Payer: Self-pay | Admitting: Cardiology

## 2021-10-20 NOTE — Telephone Encounter (Signed)
Pt notified and verbalized understanding. Pt had no other questions or concerns at this time.

## 2021-10-20 NOTE — Telephone Encounter (Signed)
Recent echo was normal, I would suggest she has pcp evaluate her SOB as well as recent cardiac testing was benign. May be more lung related  Zandra Abts MD

## 2021-10-20 NOTE — Telephone Encounter (Signed)
Pt c/o SOB over the last 2 months and has been getting worse. Denies CP and dizziness. Pt is unable to weigh at home.  Pt is going to call PCP in regards to the diarrhea.

## 2021-10-20 NOTE — Telephone Encounter (Signed)
Pt c/o Shortness Of Breath: STAT if SOB developed within the last 24 hours or pt is noticeably SOB on the phone  1. Are you currently SOB (can you hear that pt is SOB on the phone)? Shortness of breath- not at this time  2. How long have you been experiencing SOB? On and off for 2 months- but getting worse  3. Are you SOB when sitting or when up moving around? When she moving and walking  4. Are you currently experiencing any other symptoms? a lot of diarrhea, this is very unusual for her - wants to be seen if possible soon

## 2021-10-26 ENCOUNTER — Emergency Department (HOSPITAL_COMMUNITY): Payer: 59

## 2021-10-26 ENCOUNTER — Encounter (HOSPITAL_COMMUNITY): Payer: Self-pay

## 2021-10-26 ENCOUNTER — Inpatient Hospital Stay (HOSPITAL_COMMUNITY)
Admission: EM | Admit: 2021-10-26 | Discharge: 2021-10-30 | DRG: 835 | Disposition: A | Discharge status: Home / Self Care | Payer: 59 | Attending: Family Medicine | Admitting: Family Medicine

## 2021-10-26 ENCOUNTER — Other Ambulatory Visit: Payer: Self-pay

## 2021-10-26 DIAGNOSIS — Z6841 Body Mass Index (BMI) 40.0 and over, adult: Secondary | ICD-10-CM

## 2021-10-26 DIAGNOSIS — N1832 Chronic kidney disease, stage 3b: Secondary | ICD-10-CM | POA: Diagnosis present

## 2021-10-26 DIAGNOSIS — Z7984 Long term (current) use of oral hypoglycemic drugs: Secondary | ICD-10-CM

## 2021-10-26 DIAGNOSIS — Z87891 Personal history of nicotine dependence: Secondary | ICD-10-CM

## 2021-10-26 DIAGNOSIS — Z79899 Other long term (current) drug therapy: Secondary | ICD-10-CM

## 2021-10-26 DIAGNOSIS — I4891 Unspecified atrial fibrillation: Secondary | ICD-10-CM | POA: Diagnosis not present

## 2021-10-26 DIAGNOSIS — Z820 Family history of epilepsy and other diseases of the nervous system: Secondary | ICD-10-CM

## 2021-10-26 DIAGNOSIS — E119 Type 2 diabetes mellitus without complications: Secondary | ICD-10-CM

## 2021-10-26 DIAGNOSIS — R0602 Shortness of breath: Secondary | ICD-10-CM | POA: Diagnosis not present

## 2021-10-26 DIAGNOSIS — D696 Thrombocytopenia, unspecified: Secondary | ICD-10-CM | POA: Diagnosis present

## 2021-10-26 DIAGNOSIS — C9200 Acute myeloblastic leukemia, not having achieved remission: Principal | ICD-10-CM

## 2021-10-26 DIAGNOSIS — K635 Polyp of colon: Secondary | ICD-10-CM | POA: Diagnosis not present

## 2021-10-26 DIAGNOSIS — I10 Essential (primary) hypertension: Secondary | ICD-10-CM | POA: Diagnosis present

## 2021-10-26 DIAGNOSIS — D539 Nutritional anemia, unspecified: Secondary | ICD-10-CM | POA: Diagnosis present

## 2021-10-26 DIAGNOSIS — I129 Hypertensive chronic kidney disease with stage 1 through stage 4 chronic kidney disease, or unspecified chronic kidney disease: Secondary | ICD-10-CM | POA: Diagnosis present

## 2021-10-26 DIAGNOSIS — D649 Anemia, unspecified: Secondary | ICD-10-CM | POA: Diagnosis not present

## 2021-10-26 DIAGNOSIS — E785 Hyperlipidemia, unspecified: Secondary | ICD-10-CM | POA: Diagnosis present

## 2021-10-26 DIAGNOSIS — D509 Iron deficiency anemia, unspecified: Secondary | ICD-10-CM | POA: Diagnosis present

## 2021-10-26 DIAGNOSIS — R198 Other specified symptoms and signs involving the digestive system and abdomen: Secondary | ICD-10-CM | POA: Diagnosis not present

## 2021-10-26 DIAGNOSIS — R799 Abnormal finding of blood chemistry, unspecified: Secondary | ICD-10-CM | POA: Diagnosis present

## 2021-10-26 DIAGNOSIS — D61818 Other pancytopenia: Secondary | ICD-10-CM | POA: Diagnosis present

## 2021-10-26 DIAGNOSIS — R195 Other fecal abnormalities: Secondary | ICD-10-CM | POA: Diagnosis present

## 2021-10-26 DIAGNOSIS — Z8249 Family history of ischemic heart disease and other diseases of the circulatory system: Secondary | ICD-10-CM

## 2021-10-26 DIAGNOSIS — E1122 Type 2 diabetes mellitus with diabetic chronic kidney disease: Secondary | ICD-10-CM | POA: Diagnosis present

## 2021-10-26 DIAGNOSIS — Z803 Family history of malignant neoplasm of breast: Secondary | ICD-10-CM

## 2021-10-26 DIAGNOSIS — K760 Fatty (change of) liver, not elsewhere classified: Secondary | ICD-10-CM | POA: Diagnosis present

## 2021-10-26 DIAGNOSIS — Z853 Personal history of malignant neoplasm of breast: Secondary | ICD-10-CM

## 2021-10-26 DIAGNOSIS — I4821 Permanent atrial fibrillation: Secondary | ICD-10-CM | POA: Diagnosis present

## 2021-10-26 DIAGNOSIS — K640 First degree hemorrhoids: Secondary | ICD-10-CM | POA: Diagnosis present

## 2021-10-26 DIAGNOSIS — Z833 Family history of diabetes mellitus: Secondary | ICD-10-CM

## 2021-10-26 DIAGNOSIS — I482 Chronic atrial fibrillation, unspecified: Secondary | ICD-10-CM | POA: Diagnosis present

## 2021-10-26 DIAGNOSIS — Z7901 Long term (current) use of anticoagulants: Secondary | ICD-10-CM

## 2021-10-26 LAB — FERRITIN
Ferritin: 193 ng/mL (ref 11–307)
Ferritin: 182 ng/mL (ref 11–307)

## 2021-10-26 LAB — IRON AND TIBC
Iron: 109 ug/dL (ref 28–170)
Saturation Ratios: 37 % — ABNORMAL HIGH (ref 10.4–31.8)
TIBC: 298 ug/dL (ref 250–450)
UIBC: 189 ug/dL

## 2021-10-26 LAB — CBC WITH DIFFERENTIAL/PLATELET
Abs Immature Granulocytes: 0.2 10*3/uL — ABNORMAL HIGH (ref 0.00–0.07)
Band Neutrophils: 1 %
Basophils Absolute: 0 10*3/uL (ref 0.0–0.1)
Basophils Relative: 0 %
Eosinophils Absolute: 0.1 10*3/uL (ref 0.0–0.5)
Eosinophils Relative: 1 %
HCT: 16.5 % — ABNORMAL LOW (ref 36.0–46.0)
Hemoglobin: 4.7 g/dL — CL (ref 12.0–15.0)
Lymphocytes Relative: 42 %
Lymphs Abs: 2.5 10*3/uL (ref 0.7–4.0)
MCH: 32.2 pg (ref 26.0–34.0)
MCHC: 28.5 g/dL — ABNORMAL LOW (ref 30.0–36.0)
MCV: 113 fL — ABNORMAL HIGH (ref 80.0–100.0)
Metamyelocytes Relative: 1 %
Monocytes Absolute: 0.3 10*3/uL (ref 0.1–1.0)
Monocytes Relative: 5 %
Myelocytes: 2 %
Neutro Abs: 2.3 10*3/uL (ref 1.7–7.7)
Neutrophils Relative %: 38 %
Other: 10 %
Platelets: 77 10*3/uL — ABNORMAL LOW (ref 150–400)
RBC: 1.46 MIL/uL — ABNORMAL LOW (ref 3.87–5.11)
RDW: 21.5 % — ABNORMAL HIGH (ref 11.5–15.5)
WBC Morphology: ABNORMAL
WBC: 5.9 10*3/uL (ref 4.0–10.5)
nRBC: 4 % — ABNORMAL HIGH (ref 0.0–0.2)

## 2021-10-26 LAB — COMPREHENSIVE METABOLIC PANEL
ALT: 12 U/L (ref 0–44)
AST: 27 U/L (ref 15–41)
Albumin: 3.3 g/dL — ABNORMAL LOW (ref 3.5–5.0)
Alkaline Phosphatase: 52 U/L (ref 38–126)
Anion gap: 8 (ref 5–15)
BUN: 25 mg/dL — ABNORMAL HIGH (ref 8–23)
CO2: 19 mmol/L — ABNORMAL LOW (ref 22–32)
Calcium: 8.5 mg/dL — ABNORMAL LOW (ref 8.9–10.3)
Chloride: 114 mmol/L — ABNORMAL HIGH (ref 98–111)
Creatinine, Ser: 1.32 mg/dL — ABNORMAL HIGH (ref 0.44–1.00)
GFR, Estimated: 45 mL/min — ABNORMAL LOW (ref >60)
Glucose, Bld: 146 mg/dL — ABNORMAL HIGH (ref 70–99)
Potassium: 5.1 mmol/L (ref 3.5–5.1)
Sodium: 141 mmol/L (ref 135–145)
Total Bilirubin: 2 mg/dL — ABNORMAL HIGH (ref 0.3–1.2)
Total Protein: 6.6 g/dL (ref 6.5–8.1)

## 2021-10-26 LAB — ABO/RH: ABO/RH(D): A POS

## 2021-10-26 LAB — CBG MONITORING, ED: Glucose-Capillary: 115 mg/dL — ABNORMAL HIGH (ref 70–99)

## 2021-10-26 LAB — RETICULOCYTES
Immature Retic Fract: 32.9 % — ABNORMAL HIGH (ref 2.3–15.9)
RBC.: 1.54 MIL/uL — ABNORMAL LOW (ref 3.87–5.11)
Retic Count, Absolute: 69.6 10*3/uL (ref 19.0–186.0)
Retic Ct Pct: 4.5 % — ABNORMAL HIGH (ref 0.4–3.1)

## 2021-10-26 LAB — BRAIN NATRIURETIC PEPTIDE: B Natriuretic Peptide: 307 pg/mL — ABNORMAL HIGH (ref 0.0–100.0)

## 2021-10-26 LAB — PREPARE RBC (CROSSMATCH)

## 2021-10-26 LAB — FOLATE: Folate: 8.7 ng/mL (ref >5.9)

## 2021-10-26 LAB — HEMOGLOBIN AND HEMATOCRIT, BLOOD
HCT: 24.2 % — ABNORMAL LOW (ref 36.0–46.0)
Hemoglobin: 7.4 g/dL — ABNORMAL LOW (ref 12.0–15.0)

## 2021-10-26 LAB — D-DIMER, QUANTITATIVE: D-Dimer, Quant: 3.5 ug/mL-FEU — ABNORMAL HIGH (ref 0.00–0.50)

## 2021-10-26 LAB — VITAMIN B12: Vitamin B-12: 736 pg/mL (ref 180–914)

## 2021-10-26 LAB — POC OCCULT BLOOD, ED: Fecal Occult Bld: NEGATIVE

## 2021-10-26 MED ORDER — TRAZODONE HCL 50 MG PO TABS: 50.0000 mg | ORAL_TABLET | Freq: Every evening | ORAL | Status: DC | PRN

## 2021-10-26 MED ORDER — SODIUM CHLORIDE 0.9% FLUSH: 3.0000 mL | INTRAVENOUS | Status: DC | PRN

## 2021-10-26 MED ORDER — PRAVASTATIN SODIUM 40 MG PO TABS
40.0000 mg | ORAL_TABLET | Freq: Every day | ORAL | Status: DC
  Administered 2021-10-26 – 2021-10-29 (×4): 40 mg via ORAL
  Filled 2021-10-26 (×4): qty 1

## 2021-10-26 MED ORDER — SODIUM CHLORIDE 0.9 % IV SOLN
INTRAVENOUS | Status: DC | PRN
Start: 2021-10-26 — End: 2021-10-30

## 2021-10-26 MED ORDER — INSULIN ASPART 100 UNIT/ML IJ SOLN: 0.0000 [IU] | Freq: Three times a day (TID) | INTRAMUSCULAR | Status: DC

## 2021-10-26 MED ORDER — DILTIAZEM HCL ER COATED BEADS 120 MG PO CP24
120.0000 mg | ORAL_CAPSULE | Freq: Two times a day (BID) | ORAL | Status: DC
  Administered 2021-10-27 – 2021-10-30 (×5): 120 mg via ORAL
  Filled 2021-10-26 (×7): qty 1

## 2021-10-26 MED ORDER — INSULIN ASPART 100 UNIT/ML IJ SOLN: 0.0000 [IU] | Freq: Every day | INTRAMUSCULAR | Status: DC

## 2021-10-26 MED ORDER — SODIUM CHLORIDE 0.9 % IV BOLUS
1000.0000 mL | Freq: Once | INTRAVENOUS | Status: AC
  Administered 2021-10-26: 1000 mL via INTRAVENOUS

## 2021-10-26 MED ORDER — SODIUM CHLORIDE 0.9% FLUSH
3.0000 mL | Freq: Two times a day (BID) | INTRAVENOUS | Status: DC
  Administered 2021-10-26 – 2021-10-30 (×7): 3 mL via INTRAVENOUS

## 2021-10-26 MED ORDER — ONDANSETRON HCL 4 MG/2ML IJ SOLN: 4.0000 mg | Freq: Four times a day (QID) | INTRAMUSCULAR | Status: DC | PRN

## 2021-10-26 MED ORDER — SODIUM CHLORIDE 0.9% FLUSH
3.0000 mL | Freq: Two times a day (BID) | INTRAVENOUS | Status: DC
  Administered 2021-10-26 – 2021-10-29 (×4): 3 mL via INTRAVENOUS

## 2021-10-26 MED ORDER — ONDANSETRON HCL 4 MG PO TABS: 4.0000 mg | ORAL_TABLET | Freq: Four times a day (QID) | ORAL | Status: DC | PRN

## 2021-10-26 MED ORDER — METOPROLOL TARTRATE 50 MG PO TABS
50.0000 mg | ORAL_TABLET | Freq: Two times a day (BID) | ORAL | Status: DC
  Administered 2021-10-26 – 2021-10-30 (×7): 50 mg via ORAL
  Filled 2021-10-26 (×8): qty 1

## 2021-10-26 MED ORDER — POLYETHYLENE GLYCOL 3350 17 G PO PACK
17.0000 g | PACK | Freq: Every day | ORAL | Status: DC
  Administered 2021-10-26 – 2021-10-27 (×2): 17 g via ORAL
  Filled 2021-10-26 (×2): qty 1

## 2021-10-26 MED ORDER — BISACODYL 10 MG RE SUPP: 10.0000 mg | Freq: Every day | RECTAL | Status: DC | PRN

## 2021-10-26 MED ORDER — SODIUM CHLORIDE 0.9 % IV SOLN: 10.0000 mL/h | Freq: Once | INTRAVENOUS | Status: DC

## 2021-10-26 MED ORDER — ACETAMINOPHEN 650 MG RE SUPP: 650.0000 mg | Freq: Four times a day (QID) | RECTAL | Status: DC | PRN

## 2021-10-26 MED ORDER — ACETAMINOPHEN 325 MG PO TABS: 650.0000 mg | ORAL_TABLET | Freq: Four times a day (QID) | ORAL | Status: DC | PRN

## 2021-10-26 MED ORDER — PANTOPRAZOLE SODIUM 40 MG IV SOLR
40.0000 mg | Freq: Two times a day (BID) | INTRAVENOUS | Status: DC
  Administered 2021-10-26 – 2021-10-30 (×8): 40 mg via INTRAVENOUS
  Filled 2021-10-26 (×9): qty 10

## 2021-10-26 NOTE — ED Triage Notes (Signed)
Pt states she has had shortness of breath, worse with activity, for 2 months and has hx of afib. States she's been trying to see her pcp but unable to get an appointment. NAD on arrival. Able to get from wheelchair to stretcher without assistance.

## 2021-10-26 NOTE — ED Notes (Signed)
Pt care taken, resting, feeling better after the blood transfusions.

## 2021-10-26 NOTE — ED Provider Notes (Signed)
[facility] EMERGENCY DEPARTMENT Provider Note   CSN: [id number] Arrival date & time: 10/26/21  6789     History  Chief Complaint  Patient presents with   Shortness of Breath    [name] is a 66 y.o. female.  Patient has a history of atrial fibs.  She complains of shortness of breath for 3 weeks.  She takes Eliquis.  No other complaints.  Patient talked with her cardiologist who stated that her echo looked good and he did not believe his shortness of breath was related to her heart.  He recommended she get in touch with her family doctor and go to a pulmonologist.  She has been attempting that for couple weeks but has not been able to get into see anyone  The history is provided by the patient and medical records. No language interpreter was used.  Shortness of Breath Severity:  Moderate Onset quality:  Sudden Timing:  Constant Progression:  Worsening Chronicity:  New Context: activity   Relieved by:  Nothing Worsened by:  Nothing Ineffective treatments:  None tried Associated symptoms: no abdominal pain, no chest pain, no cough, no headaches and no rash   Risk factors: no recent alcohol use        Home Medications Prior to Admission medications   Medication Sig Start Date End Date Taking? Authorizing Provider  acetaminophen (TYLENOL) 325 MG tablet Take 650 mg by mouth every 6 (six) hours as needed for moderate pain.    [provider]  apixaban (ELIQUIS) 5 MG TABS tablet Take 1 tablet by mouth twice daily 09/27/21   Arnoldo Lenis, MD  CARTIA XT 120 MG 24 hr capsule Take 1 capsule by mouth twice daily 09/27/21   Arnoldo Lenis, MD  HYDROcodone-acetaminophen (NORCO) 5-325 MG tablet Take 1 tablet by mouth every 4 (four) hours as needed for moderate pain. Patient not taking: Reported on 07/14/2021 02/20/21   Aviva Signs, MD  ibuprofen (ADVIL) 200 MG tablet Take 400 mg by mouth every 6 (six) hours as needed for moderate pain.    [provider]   lisinopril (ZESTRIL) 2.5 MG tablet Take 2.5 mg by mouth daily. 02/18/19   [provider]  metFORMIN (GLUCOPHAGE-XR) 500 MG 24 hr tablet Take 500 mg by mouth See admin instructions. Take 500 mg by mouth in the morning and 1000 mg at bedtime 03/26/18   [provider]  metoprolol tartrate (LOPRESSOR) 50 MG tablet Take 1 tablet by mouth twice daily 09/27/21   Arnoldo Lenis, MD  Multiple Vitamin (MULTIVITAMIN) tablet Take 1 tablet by mouth daily. Patient not taking: Reported on 07/14/2021    [provider]  pravastatin (PRAVACHOL) 40 MG tablet Take 40 mg by mouth at bedtime.    [provider]      Allergies    Patient has no known allergies.    Review of Systems   Review of Systems  Constitutional:  Negative for appetite change and fatigue.  HENT:  Negative for congestion, ear discharge and sinus pressure.   Eyes:  Negative for discharge.  Respiratory:  Positive for shortness of breath. Negative for cough.   Cardiovascular:  Negative for chest pain.  Gastrointestinal:  Negative for abdominal pain and diarrhea.  Genitourinary:  Negative for frequency and hematuria.  Musculoskeletal:  Negative for back pain.  Skin:  Negative for rash.  Neurological:  Negative for seizures and headaches.  Psychiatric/Behavioral:  Negative for hallucinations.     Physical Exam Updated Vital  Signs BP (!) 87/58 Comment: taken 3x to verify  Pulse (!) 109   Temp 97.9 F (36.6 C)   Resp 13   Ht '5\' 2"'$  (1.575 m)   Wt 127 kg   LMP 11/17/2014   SpO2 99%   BMI 51.21 kg/m  Physical Exam Vitals reviewed.  Constitutional:      Appearance: She is well-developed.  HENT:     Head: Normocephalic.     Nose: Nose normal.  Eyes:     General: No scleral icterus.    Conjunctiva/sclera: Conjunctivae normal.  Neck:     Thyroid: No thyromegaly.  Cardiovascular:     Rate and Rhythm: Normal rate and regular rhythm.     Heart sounds: No murmur heard.    No friction rub. No  gallop.  Pulmonary:     Breath sounds: No stridor. No wheezing or rales.  Chest:     Chest wall: No tenderness.  Abdominal:     General: There is no distension.     Tenderness: There is no abdominal tenderness. There is no rebound.  Genitourinary:    Comments: Rectal exam heme-negative Musculoskeletal:        General: Normal range of motion.     Cervical back: Neck supple.  Lymphadenopathy:     Cervical: No cervical adenopathy.  Skin:    Findings: No erythema or rash.  Neurological:     Mental Status: She is alert and oriented to person, place, and time.     Motor: No abnormal muscle tone.     Coordination: Coordination normal.  Psychiatric:        Behavior: Behavior normal.     ED Results / Procedures / Treatments   Labs (all labs ordered are listed, but only abnormal results are displayed) Labs Reviewed  CBC WITH DIFFERENTIAL/PLATELET - Abnormal; Notable for the following components:      Result Value   RBC 1.46 (*)    Hemoglobin 4.7 (*)    HCT 16.5 (*)    MCV 113.0 (*)    MCHC 28.5 (*)    RDW 21.5 (*)    Platelets 77 (*)    nRBC 4.0 (*)    Abs Immature Granulocytes 0.20 (*)    All other components within normal limits  COMPREHENSIVE METABOLIC PANEL - Abnormal; Notable for the following components:   Chloride 114 (*)    CO2 19 (*)    Glucose, Bld 146 (*)    BUN 25 (*)    Creatinine, Ser 1.32 (*)    Calcium 8.5 (*)    Albumin 3.3 (*)    Total Bilirubin 2.0 (*)    GFR, Estimated 45 (*)    All other components within normal limits  BRAIN NATRIURETIC PEPTIDE - Abnormal; Notable for the following components:   B Natriuretic Peptide 307.0 (*)    All other components within normal limits  D-DIMER, QUANTITATIVE (NOT AT [facility]) - Abnormal; Notable for the following components:   D-Dimer, Quant 3.50 (*)    All other components within normal limits  IRON AND TIBC - Abnormal; Notable for the following components:   Saturation Ratios 37 (*)    All other components  within normal limits  RETICULOCYTES - Abnormal; Notable for the following components:   Retic Ct Pct 4.5 (*)    RBC. 1.54 (*)    Immature Retic Fract 32.9 (*)    All other components within normal limits  VITAMIN B12  FERRITIN  FOLATE  PATHOLOGIST SMEAR REVIEW  POC OCCULT BLOOD, ED  POC OCCULT BLOOD, ED  TYPE AND SCREEN  PREPARE RBC (CROSSMATCH)    EKG EKG Interpretation  Date/Time:  Thursday October 26 2021 08:45:39 EDT Ventricular Rate:  108 PR Interval:    QRS Duration: 89 QT Interval:  357 QTC Calculation: 452 R Axis:   85 Text Interpretation: Atrial fibrillation Borderline right axis deviation Low voltage, extremity and precordial leads Minimal ST depression, anterolateral leads Confirmed by Milton Ferguson [phone]) on 10/26/2021 11:01:55 AM  Radiology DG Chest 2 View  Result Date: 10/26/2021 CLINICAL DATA:  Shortness of breath EXAM: CHEST - 2 VIEW COMPARISON:  10/11/2015 FINDINGS: Transverse diameter of heart is slightly increased. There are no signs of pulmonary edema or focal pulmonary consolidation. There is no pleural effusion or pneumothorax. IMPRESSION: There are no signs of pulmonary edema or focal pulmonary consolidation. Electronically Signed   By: Elmer Picker M.D.   On: 10/26/2021 09:19    Procedures Procedures    Medications Ordered in ED Medications  0.9 %  sodium chloride infusion (has no administration in time range)  pantoprazole (PROTONIX) injection 40 mg (has no administration in time range)  sodium chloride 0.9 % bolus 1,000 mL (1,000 mLs Intravenous New Bag/Given 10/26/21 1021)    ED Course/ Medical Decision Making/ A&P  CRITICAL CARE Performed by: Milton Ferguson Total critical care time: 40 minutes Critical care time was exclusive of separately billable procedures and treating other patients. Critical care was necessary to treat or prevent imminent or life-threatening deterioration. Critical care was time spent personally by me on the  following activities: development of treatment plan with patient and/or surrogate as well as nursing, discussions with consultants, evaluation of patient's response to treatment, examination of patient, obtaining history from patient or surrogate, ordering and performing treatments and interventions, ordering and review of laboratory studies, ordering and review of radiographic studies, pulse oximetry and re-evaluation of patient's condition.                          Medical Decision Making Amount and/or Complexity of Data Reviewed Labs: ordered. Radiology: ordered.  Risk Prescription drug management. Decision regarding hospitalization.  This patient presents to the ED for concern of weakness, this involves an extensive number of treatment options, and is a complaint that carries with it a high risk of complications and morbidity.  The differential diagnosis includes pneumonia, MI, PE   Co morbidities that complicate the patient evaluation  Atrial fibs on blood thinner   Additional history obtained:  Additional history obtained from family External records from outside source obtained and reviewed including hospital records   Lab Tests:  I Ordered, and personally interpreted labs.  The pertinent results include: Hemoglobin 4.7, glucose 146   Imaging Studies ordered:  I ordered imaging studies including chest x-ray I independently visualized and interpreted imaging which showed unremarkable I agree with the radiologist interpretation   Cardiac Monitoring: / EKG:  The patient was maintained on a cardiac monitor.  I personally viewed and interpreted the cardiac monitored which showed an underlying rhythm of: Atrial fibs   Consultations Obtained:  I requested consultation with the hospitalist,  and discussed lab and imaging findings as well as pertinent plan - they recommend: Admission and transfused packed red blood cells   Problem List / ED Course / Critical  interventions / Medication management  Weakness, atrial fibs I ordered medication including normal saline and packed red blood cells Reevaluation of the patient after  these medicines showed that the patient improved I have reviewed the patients home medicines and have made adjustments as needed   Social Determinants of Health:  None   Test / Admission - Considered:  None  Patient with significant anemia nausea and short of breath.  She will be admitted to medicine and transfused        Final Clinical Impression(s) / ED Diagnoses Final diagnoses:  Acute anemia    Rx / DC Orders ED Discharge Orders     None         Milton Ferguson, MD 10/26/21 1714

## 2021-10-26 NOTE — H&P (Addendum)
Patient Demographics:    Kaitlyn Hull, is a 66 y.o. female  MRN: [id number]   DOB - 1955/03/23  Admit Date - 10/26/2021  Outpatient Primary MD for the patient is [name], MD   Assessment & Plan:   Assessment and Plan:  1)Severe Symptomatic Anemia -Hemoglobin 4.7 no recent baseline available, however Care Everywhere reveals hemoglobin was 13.3 and platelets was 212 on 11/26/2019 at [place],  -No obvious bleeding noted, the patient is compliant with his Eliquis -Patient has been having fatigue, dizziness,  dyspnea with activity for about 3 months, over the last week she has had some dyspnea at rest -No prior colonoscopy or EGD -Patient had a positive Cologuard test in October 2022 but patient denies being informed of this abnormal test, and patient had no follow-up for this abnormal Cologuard test --Has used NSAIDs in the past but not in the last month or so -Type and cross and transfuse 2 units of PRBC -Hold Eliquis -Continue IV PPI -B12 and folate are not low -Serum iron is normal at 109, TIBC 298 iron saturation high at 37 -Ferritin WNL at 193, reticulocyte count percentage elevated at 4.5%,  immature reticulocyte fraction elevated at 32.9  clear diet for now -GI consult appreciated will probably need both EGD and colonoscopy after Eliquis washout.  2) thrombocytopenia--- platelets down to 77, -platelets was 212 on 11/26/2019 at [place] -No obvious bleeding the patient compliant with his Eliquis  3) elevated D-dimer--- 3.50,  --Suspect this is an acute phase reactant patient is compliant with Eliquis --low clinical index of suspicion for DVT/PE  4)CKD 3B--care everywhere reveals creatinine was 1.07 with a GFR 56 on 11/26/2019 at [place] -Creatinine today is 1.32  with a GFR of 45, suspect CKD progression from CKD 3A to CKD 3B in the setting of underlying hypertension and diabetes rather than AKI  5) chronic atrial fibrillation--- hold Eliquis due to severe anemia and thrombocytopenia with concerns for GI blood loss -Continue Cardizem CD and metoprolol for rate control  6)DM2-previously well controlled -Hold metformin Use Novolog/Humalog Sliding scale insulin with Accu-Cheks/Fingersticks as ordered   7)HLD----continue pravastatin  8)Morbid Obesity- -Low calorie diet, portion control and increase physical activity discussed with patient -Body mass index is 51.21 kg/m.   Disposition/Need for in-Hospital Stay- patient unable to be discharged at this time due to --severe symptomatic anemia and thrombocytopenia requiring transfusion of PRBC and possible endoluminal evaluation after Eliquis washout   Dispo: The patient is from: Home              Anticipated d/c is to: Home              Anticipated d/c date is: 2 days              Patient currently is not medically stable to d/c. Barriers: Not Clinically Stable-    With History of - Reviewed by me  Past  Medical History:  Diagnosis Date   Atrial fibrillation with RVR ([place]) 06/2013   Breast cancer ([place]) 2007   left   Breast disorder    cancer   Diabetes mellitus    Type II   Dysrhythmia    Hematuria 01/20/2014   Hypertension    Hyperthyroidism 06/2013   Obesity    PMB (postmenopausal bleeding) 07/24/2012   Had 16.1 mm endometrium on [place] will get endo biopsy   Psoriasis    Urinary frequency 01/20/2014      Past Surgical History:  Procedure Laterality Date   BREAST BIOPSY Left 09/14/2020   Procedure: BREAST BIOPSY;  Surgeon: Aviva Signs, MD;  Location: [facility];  Service: General;  Laterality: Left;   BREAST SURGERY  02/20/06   left side    CESAREAN SECTION  1995   HYSTEROSCOPY WITH D & C N/A 08/13/2012   Procedure: DILATATION AND CURETTAGE /HYSTEROSCOPY;  Surgeon: Florian Buff,  MD;  Location: [facility];  Service: Gynecology;  Laterality: N/A;   SECONDARY CLOSURE OF WOUND Left 02/20/2021   Procedure: SIMPLE WOUND CLOSURE;  Surgeon: Aviva Signs, MD;  Location: [facility];  Service: General;  Laterality: Left;      Chief Complaint  Patient presents with   Shortness of Breath      HPI:    Kaitlyn Hull  is a 66 y.o. female morbid obesity, permanent A-fib on Eliquis, history of breast cancer, diabetes, hypertension, HLD and CKD 3B presenting with worsening shortness of CKD progression and dyspnea on exertion for almost 3 months- --No obvious bleeding noted, the patient is compliant with his Eliquis -Patient has been having fatigue, dizziness,  dyspnea with activity for about 3 months, over the last week she has had some dyspnea at rest -No prior colonoscopy or EGD --Patient had a positive Cologuard test in October 2022 but patient denies being informed of this abnormal test, and patient had no follow-up for this abnormal Cologuard test -Hemoglobin 4.7 no recent baseline available, however Care Everywhere reveals hemoglobin was 13.3 and platelets went 212 on 11/26/2019 at [place],  -Has used NSAIDs in the past but not in the last month or so - No hematochezia, no hematemesis, no melena, no bright red blood per rectum -Denies abdominal pain, denies chest pains No fever  Or chills   No Nausea, Vomiting or Diarrhea  -Chest x-ray without acute findings -Creatinine 1.23 with GFR 45 -LFTs are not elevated    Review of systems:    In addition to the HPI above,   A full Review of  Systems was done, all other systems reviewed are negative except as noted above in HPI , .    Social History:  Reviewed by me    Social History   Tobacco Use   Smoking status: Former    Packs/day: 0.01    Years: 1.00    Total pack years: 0.01    Types: Cigarettes    Start date: 03/05/1976    Quit date: 03/06/2003    Years since quitting: 18.6   Smokeless tobacco: Never   Substance Use Topics   Alcohol use: No    Alcohol/week: 0.0 standard drinks of alcohol       Family History :  Reviewed by me    Family History  Problem Relation Age of Onset   Heart failure Father    CAD Father    Diabetes Sister    Other Sister        blood clots  Breast cancer Sister    Alzheimer's disease Mother    Diabetes Sister    Hypertension Maternal Grandmother    Colon cancer Neg Hx     Home Medications:   Prior to Admission medications   Medication Sig Start Date End Date Taking? Authorizing Provider  apixaban (ELIQUIS) 5 MG TABS tablet Take 1 tablet by mouth twice daily Patient taking differently: Take 5 mg by mouth 2 (two) times daily. 09/27/21  Yes BranchAlphonse Guild, MD  CARTIA XT 120 MG 24 hr capsule Take 1 capsule by mouth twice daily 09/27/21  Yes Branch, Alphonse Guild, MD  lisinopril (ZESTRIL) 2.5 MG tablet Take 2.5 mg by mouth daily. 02/18/19  Yes [provider]  metFORMIN (GLUCOPHAGE-XR) 500 MG 24 hr tablet Take 500-1,000 mg by mouth See admin instructions. Take 500 mg by mouth in the morning and 1000 mg at bedtime 03/26/18  Yes [provider]  metoprolol tartrate (LOPRESSOR) 50 MG tablet Take 1 tablet by mouth twice daily Patient taking differently: Take 50 mg by mouth 2 (two) times daily. 09/27/21  Yes BranchAlphonse Guild, MD  pravastatin (PRAVACHOL) 40 MG tablet Take 40 mg by mouth at bedtime.   Yes [provider]     Allergies:    No Known Allergies   Physical Exam:   Vitals  Blood pressure 104/69, pulse (!) 101, temperature 98 F (36.7 C), temperature source Oral, resp. rate 18, height '5\' 2"'$  (1.575 m), weight 127 kg, last menstrual period 11/17/2014, SpO2 100 %.  Physical Examination: General appearance - alert, morbidly obese,  in no distress  Mental status - alert, oriented to person, place, and time,  Eyes - sclera anicteric Neck - supple, no JVD elevation , Chest - clear  to auscultation bilaterally,  symmetrical air movement, Heart - S1 and S2 normal, regular, tachycardia Abdomen - soft, nontender, nondistended, +BS, increased truncal adiposity Neurological - screening mental status exam normal, neck supple without rigidity, cranial nerves II through XII intact, DTR's normal and symmetric Extremities - no pedal edema noted, intact peripheral pulses  Skin - warm, dry, pale     Data Review:    CBC Recent Labs  Lab 10/26/21 0937  WBC 5.9  HGB 4.7*  HCT 16.5*  PLT 77*  MCV 113.0*  MCH 32.2  MCHC 28.5*  RDW 21.5*  LYMPHSABS 2.5  MONOABS 0.3  EOSABS 0.1  BASOSABS 0.0   ------------------------------------------------------------------------------------------------------------------  Chemistries  Recent Labs  Lab 10/26/21 0937  NA 141  K 5.1  CL 114*  CO2 19*  GLUCOSE 146*  BUN 25*  CREATININE 1.32*  CALCIUM 8.5*  AST 27  ALT 12  ALKPHOS 52  BILITOT 2.0*   ------------------------------------------------------------------------------------------------------------------ estimated creatinine clearance is 55 mL/min (A) (by C-G formula based on SCr of 1.32 mg/dL (H)). ------------------------------------------------------------------------------------------------------------------ No results for input(s): "TSH", "T4TOTAL", "T3FREE", "THYROIDAB" in the last 72 hours.  Invalid input(s): "FREET3"   Coagulation profile No results for input(s): "INR", "PROTIME" in the last 168 hours. ------------------------------------------------------------------------------------------------------------------- Recent Labs    10/26/21 1005  DDIMER 3.50*   -------------------------------------------------------------------------------------------------------------------  Cardiac Enzymes No results for input(s): "CKMB", "TROPONINI", "MYOGLOBIN" in the last 168 hours.  Invalid input(s):  "CK" ------------------------------------------------------------------------------------------------------------------    Component Value Date/Time   BNP 307.0 (H) 10/26/2021 0937     ---------------------------------------------------------------------------------------------------------------  Urinalysis    Component Value Date/Time   COLORURINE YELLOW 01/20/2014 1620   APPEARANCEUR CLEAR 01/20/2014 1620   LABSPEC 1.008 01/20/2014 1620   PHURINE 6.0 01/20/2014 1620  GLUCOSEU NEG 01/20/2014 1620   HGBUR MOD (A) 01/20/2014 1620   BILIRUBINUR NEG 01/20/2014 1620   KETONESUR NEG 01/20/2014 1620   PROTEINUR NEG 01/20/2014 1620   PROTEINUR neg 01/20/2014 1550   UROBILINOGEN 1 01/20/2014 1620   NITRITE NEG 01/20/2014 1620   NITRITE neg 01/20/2014 1550   LEUKOCYTESUR LARGE (A) 01/20/2014 1620    ----------------------------------------------------------------------------------------------------------------   Imaging Results:    DG Chest 2 View  Result Date: 10/26/2021 CLINICAL DATA:  Shortness of breath EXAM: CHEST - 2 VIEW COMPARISON:  10/11/2015 FINDINGS: Transverse diameter of heart is slightly increased. There are no signs of pulmonary edema or focal pulmonary consolidation. There is no pleural effusion or pneumothorax. IMPRESSION: There are no signs of pulmonary edema or focal pulmonary consolidation. Electronically Signed   By: Elmer Picker M.D.   On: 10/26/2021 09:19    Radiological Exams on Admission: DG Chest 2 View  Result Date: 10/26/2021 CLINICAL DATA:  Shortness of breath EXAM: CHEST - 2 VIEW COMPARISON:  10/11/2015 FINDINGS: Transverse diameter of heart is slightly increased. There are no signs of pulmonary edema or focal pulmonary consolidation. There is no pleural effusion or pneumothorax. IMPRESSION: There are no signs of pulmonary edema or focal pulmonary consolidation. Electronically Signed   By: Elmer Picker M.D.   On: 10/26/2021 09:19    DVT  Prophylaxis -SCD -GI bleed AM Labs Ordered, also please review Full Orders  Family Communication: Admission, patients condition and plan of care including tests being ordered have been discussed with the patient who indicate understanding and agree with the plan   Condition  --stable  Roxan Hockey M.D on 10/26/2021 at 6:47 PM Go to [email or web address] -  for contact info  [facility] - Office  [phone]

## 2021-10-26 NOTE — ED Provider Notes (Signed)
Ultrasound ED Peripheral IV (Provider)  Date/Time: 10/26/2021 11:13 AM  Performed by: Jacqlyn Larsen, PA-C Authorized by: Jacqlyn Larsen, PA-C   Procedure details:    Indications: multiple failed IV attempts and poor IV access     Skin Prep: chlorhexidine gluconate     Location:  Left AC   Angiocath:  20 G   Bedside Ultrasound Guided: Yes     Images: not archived     Patient tolerated procedure without complications: Yes     Dressing applied: Yes       Jacqlyn Larsen, PA-C 10/26/21 1113    Milton Ferguson, MD 10/26/21 1712

## 2021-10-26 NOTE — Consult Note (Addendum)
Gastroenterology Consult   Referring Provider: No ref. provider found Primary Care Physician:  [name], MD Primary Gastroenterologist:  Garfield Cornea, MD  Patient ID: Kaitlyn Hull; [id number]; 1955-05-16   Admit date: 10/26/2021  LOS: 0 days   Date of Consultation: 10/26/2021  Reason for Consultation:  anemia    History of Present Illness   Kaitlyn Hull is a 66 y.o. female with history of permanent A-fib on Eliquis, history of breast cancer, diabetes, hypertension, obesity presenting with several week history of shortness of breath.  Patient reached out to her cardiologist regarding symptoms but given unremarkable ECHO she was advised to call PCP. She made call to her PCP but never heard back.  Decided to come to the ED for progressive shortness of breath and weakness.  In ED: Hemoglobin 4.7, hematocrit 16.5, MCV 113, platelets 77,000.  Hemoglobin 10.9 one-year ago.  BUN 25, creatinine 1.32, albumin 3.3, total bilirubin 2.0, alkaline phosphatase 52, AST 27, ALT 12.  Vitamin B12 736, folate 8.7, iron 109, TIBC 298, iron saturation 37%, ferritin 193, heme-negative stool exam.  BNP 307.  D-dimer 3.50  Patient denies constipation, melena, rectal bleeding.  Over the past couple of weeks she has had some postprandial loose stools more frequent than she has had in the past.  Denies any upper GI symptoms.  Last night she noted some burning at the umbilicus associated with some bright red blood.  Noted to have some bleeding again this morning.  Denies dysuria, hematuria.  Last dose of Eliquis this morning.  Cologuard positive October 2022.  Patient states she was unaware.  No prior colonoscopy.  Prior to Admission medications   Medication Sig Start Date End Date Taking? Authorizing Provider  apixaban (ELIQUIS) 5 MG TABS tablet Take 1 tablet by mouth twice daily Patient taking differently: Take 5 mg by mouth 2 (two) times daily. 09/27/21  Yes BranchAlphonse Guild, MD  CARTIA XT 120  MG 24 hr capsule Take 1 capsule by mouth twice daily 09/27/21  Yes Branch, Alphonse Guild, MD  lisinopril (ZESTRIL) 2.5 MG tablet Take 2.5 mg by mouth daily. 02/18/19  Yes [provider]  metFORMIN (GLUCOPHAGE-XR) 500 MG 24 hr tablet Take 500-1,000 mg by mouth See admin instructions. Take 500 mg by mouth in the morning and 1000 mg at bedtime 03/26/18  Yes [provider]  metoprolol tartrate (LOPRESSOR) 50 MG tablet Take 1 tablet by mouth twice daily Patient taking differently: Take 50 mg by mouth 2 (two) times daily. 09/27/21  Yes BranchAlphonse Guild, MD  pravastatin (PRAVACHOL) 40 MG tablet Take 40 mg by mouth at bedtime.   Yes [provider]    Current Facility-Administered Medications  Medication Dose Route Frequency Provider Last Rate Last Admin   0.9 %  sodium chloride infusion  10 mL/hr Intravenous Once Milton Ferguson, MD       pantoprazole (PROTONIX) injection 40 mg  40 mg Intravenous Q12H Emokpae, Courage, MD   40 mg at 10/26/21 1136   Current Outpatient Medications  Medication Sig Dispense Refill   apixaban (ELIQUIS) 5 MG TABS tablet Take 1 tablet by mouth twice daily (Patient taking differently: Take 5 mg by mouth 2 (two) times daily.) 180 tablet 1   CARTIA XT 120 MG 24 hr capsule Take 1 capsule by mouth twice daily 180 capsule 1   lisinopril (ZESTRIL) 2.5 MG tablet Take 2.5 mg by mouth daily.     metFORMIN (GLUCOPHAGE-XR) 500 MG 24 hr tablet  Take 500-1,000 mg by mouth See admin instructions. Take 500 mg by mouth in the morning and 1000 mg at bedtime     metoprolol tartrate (LOPRESSOR) 50 MG tablet Take 1 tablet by mouth twice daily (Patient taking differently: Take 50 mg by mouth 2 (two) times daily.) 180 tablet 1   pravastatin (PRAVACHOL) 40 MG tablet Take 40 mg by mouth at bedtime.      Allergies as of 10/26/2021   (No Known Allergies)    Past Medical History:  Diagnosis Date   Atrial fibrillation with RVR ([place]) 06/2013   Breast cancer ([place]) 2007    left   Breast disorder    cancer   Diabetes mellitus    Type II   Dysrhythmia    Hematuria 01/20/2014   Hypertension    Hyperthyroidism 06/2013   Obesity    PMB (postmenopausal bleeding) 07/24/2012   Had 16.1 mm endometrium on [place] will get endo biopsy   Psoriasis    Urinary frequency 01/20/2014    Past Surgical History:  Procedure Laterality Date   BREAST BIOPSY Left 09/14/2020   Procedure: BREAST BIOPSY;  Surgeon: Aviva Signs, MD;  Location: [facility];  Service: General;  Laterality: Left;   BREAST SURGERY  02/20/06   left side    CESAREAN SECTION  1995   HYSTEROSCOPY WITH D & C N/A 08/13/2012   Procedure: DILATATION AND CURETTAGE /HYSTEROSCOPY;  Surgeon: Florian Buff, MD;  Location: [facility];  Service: Gynecology;  Laterality: N/A;   SECONDARY CLOSURE OF WOUND Left 02/20/2021   Procedure: SIMPLE WOUND CLOSURE;  Surgeon: Aviva Signs, MD;  Location: [facility];  Service: General;  Laterality: Left;    Family History  Problem Relation Age of Onset   Heart failure Father    CAD Father    Diabetes Sister    Other Sister        blood clots   Breast cancer Sister    Alzheimer's disease Mother    Diabetes Sister    Hypertension Maternal Grandmother    Colon cancer Neg Hx     Social History   Socioeconomic History   Marital status: Widowed    Spouse name: Not on file   Number of children: Not on file   Years of education: Not on file   Highest education level: Not on file  Occupational History   Not on file  Tobacco Use   Smoking status: Former    Packs/day: 0.01    Years: 1.00    Total pack years: 0.01    Types: Cigarettes    Start date: 03/05/1976    Quit date: 03/06/2003    Years since quitting: 18.6   Smokeless tobacco: Never  Vaping Use   Vaping Use: Never used  Substance and Sexual Activity   Alcohol use: No    Alcohol/week: 0.0 standard drinks of alcohol   Drug use: No   Sexual activity: Not Currently    Birth control/protection: Post-menopausal  Other  Topics Concern   Not on file  Social History Narrative   Not on file   Social Determinants of Health   Financial Resource Strain: Not on file  Food Insecurity: Not on file  Transportation Needs: Not on file  Physical Activity: Not on file  Stress: Not on file  Social Connections: Not on file  Intimate Partner Violence: Not on file     Review of System:   General: Negative for anorexia, weight loss, fever, chills. +fatigue, weakness. Eyes: Negative for  vision changes.  ENT: Negative for hoarseness, difficulty swallowing , nasal congestion. CV: Negative for chest pain, angina, palpitations,  peripheral edema. +DOE Respiratory: Negative for dyspnea at rest, cough, sputum, wheezing. +DOE GI: See history of present illness. GU:  Negative for dysuria, hematuria, urinary incontinence, urinary frequency, nocturnal urination.  MS: Negative for joint pain, low back pain.  Derm: Negative for rash or itching.  Neuro: Negative for weakness, abnormal sensation, seizure, frequent headaches, memory loss, confusion.  Psych: Negative for anxiety, depression, suicidal ideation, hallucinations.  Endo: Negative for unusual weight change.  Heme: Negative for bruising or bleeding. Allergy: Negative for rash or hives.      Physical Examination:   Vital signs in last 24 hours: Temp:  [97.9 F (36.6 C)] 97.9 F (36.6 C) (08/24 0846) Pulse Rate:  [92-109] 92 (08/24 1130) Resp:  [13-27] 24 (08/24 1130) BP: (87-106)/(35-67) 104/53 (08/24 1130) SpO2:  [99 %-100 %] 100 % (08/24 1130) Weight:  [212 kg] 127 kg (08/24 0846)    General: Obese, pale female in no acute distress.  Head: Normocephalic, atraumatic.   Eyes: Conjunctiva pale, no icterus. Mouth: Oropharyngeal mucosa moist and pink , no lesions erythema or exudate. Neck: Supple without thyromegaly, masses, or lymphadenopathy.  Lungs: Clear to auscultation bilaterally.  Heart: Regular rate and rhythm, no murmurs rubs or gallops.  Abdomen:  Bowel sounds are normal, nontender, nondistended, no hepatosplenomegaly or masses, no abdominal bruits or hernia , no rebound or guarding.  Bright red blood noted at the umbilicus Rectal: Heme-negative in the ED Extremities: No lower extremity edema, clubbing, deformity.  Neuro: Alert and oriented x 4 , grossly normal neurologically.  Skin: Warm and dry, no rash or jaundice.   Psych: Alert and cooperative, normal mood and affect.        Intake/Output from previous day: No intake/output data recorded. Intake/Output this shift: No intake/output data recorded.  Lab Results:   CBC Recent Labs    10/26/21 0937  WBC 5.9  HGB 4.7*  HCT 16.5*  MCV 113.0*  PLT 77*   BMET Recent Labs    10/26/21 0937  NA 141  K 5.1  CL 114*  CO2 19*  GLUCOSE 146*  BUN 25*  CREATININE 1.32*  CALCIUM 8.5*   LFT Recent Labs    10/26/21 0937  BILITOT 2.0*  ALKPHOS 52  AST 27  ALT 12  PROT 6.6  ALBUMIN 3.3*    Lipase No results for input(s): "LIPASE" in the last 72 hours.  PT/INR No results for input(s): "LABPROT", "INR" in the last 72 hours.   Hepatitis Panel No results for input(s): "HEPBSAG", "HCVAB", "HEPAIGM", "HEPBIGM" in the last 72 hours.   Imaging Studies:   DG Chest 2 View  Result Date: 10/26/2021 CLINICAL DATA:  Shortness of breath EXAM: CHEST - 2 VIEW COMPARISON:  10/11/2015 FINDINGS: Transverse diameter of heart is slightly increased. There are no signs of pulmonary edema or focal pulmonary consolidation. There is no pleural effusion or pneumothorax. IMPRESSION: There are no signs of pulmonary edema or focal pulmonary consolidation. Electronically Signed   By: Elmer Picker M.D.   On: 10/26/2021 09:19  [4 week]  Assessment:   Pleasant 66 year old female with morbid obesity, permanent A-fib on Eliquis, history of breast cancer, hypertension, psoriasis, diabetes presenting to the ED with progressive shortness of breath and fatigue.  Noted to have profound  macrocytic anemia, thrombocytopenia.  GI was consulted for further management.  Macrocytic anemia: Normal B12 and folate.  No IDA.  Denies overt GI bleeding, has noted mild amount of fresh blood at the umbilicus since yesterday.  Reticulocyte count elevated.  Low RBCs. Hgb 10.9 one year ago. Denies abdominal pain. She has history of fatty liver seen on remote imaging. No recent imaging, cannot exclude cirrhosis given thrombocytopenia.   She had positive Cologuard last year that she was unaware of. No prior colonoscopy. She will need colonoscopy and possible upper endoscopy to evaluate.   Positive D-dimer: per attending  Plan:   Hold Eliquis. IV PPI BID. Clear liquid diet.  Colonoscopy with possible EGD likely Saturday to allow time for Eliquis to wash out.  Monitor umbilical bleeding.    LOS: 0 days   We would like to thank you for the opportunity to participate in the care of ZLATA ALCAIDE.  Laureen Ochs. Bernarda Caffey [facility] [phone] 8/24/20231:00 PM  Addendum: discussed with Dr. Jenetta Downer, will reassess in the morning to determine timing of procedures, likely Saturday. Patient would benefit from colonoscopy/egd while inpatient as she has mentioned it would be best to "get her while she is here". She has cancelled colonoscopy in the past due to fear.   Laureen Ochs. Bernarda Caffey [facility] [phone] 8/24/20234:21 PM

## 2021-10-27 DIAGNOSIS — D649 Anemia, unspecified: Secondary | ICD-10-CM | POA: Diagnosis not present

## 2021-10-27 DIAGNOSIS — Z803 Family history of malignant neoplasm of breast: Secondary | ICD-10-CM | POA: Diagnosis not present

## 2021-10-27 DIAGNOSIS — Z833 Family history of diabetes mellitus: Secondary | ICD-10-CM | POA: Diagnosis not present

## 2021-10-27 DIAGNOSIS — E119 Type 2 diabetes mellitus without complications: Secondary | ICD-10-CM | POA: Diagnosis not present

## 2021-10-27 DIAGNOSIS — K640 First degree hemorrhoids: Secondary | ICD-10-CM | POA: Diagnosis not present

## 2021-10-27 DIAGNOSIS — E785 Hyperlipidemia, unspecified: Secondary | ICD-10-CM | POA: Diagnosis not present

## 2021-10-27 DIAGNOSIS — Z6841 Body Mass Index (BMI) 40.0 and over, adult: Secondary | ICD-10-CM | POA: Diagnosis not present

## 2021-10-27 DIAGNOSIS — Z87891 Personal history of nicotine dependence: Secondary | ICD-10-CM | POA: Diagnosis not present

## 2021-10-27 DIAGNOSIS — D696 Thrombocytopenia, unspecified: Secondary | ICD-10-CM | POA: Diagnosis not present

## 2021-10-27 DIAGNOSIS — Z7901 Long term (current) use of anticoagulants: Secondary | ICD-10-CM | POA: Diagnosis not present

## 2021-10-27 DIAGNOSIS — D61818 Other pancytopenia: Secondary | ICD-10-CM | POA: Diagnosis not present

## 2021-10-27 DIAGNOSIS — E1122 Type 2 diabetes mellitus with diabetic chronic kidney disease: Secondary | ICD-10-CM | POA: Diagnosis not present

## 2021-10-27 DIAGNOSIS — I129 Hypertensive chronic kidney disease with stage 1 through stage 4 chronic kidney disease, or unspecified chronic kidney disease: Secondary | ICD-10-CM | POA: Diagnosis not present

## 2021-10-27 DIAGNOSIS — K635 Polyp of colon: Secondary | ICD-10-CM | POA: Diagnosis not present

## 2021-10-27 DIAGNOSIS — C9200 Acute myeloblastic leukemia, not having achieved remission: Secondary | ICD-10-CM | POA: Diagnosis not present

## 2021-10-27 DIAGNOSIS — R799 Abnormal finding of blood chemistry, unspecified: Secondary | ICD-10-CM | POA: Diagnosis not present

## 2021-10-27 DIAGNOSIS — N1832 Chronic kidney disease, stage 3b: Secondary | ICD-10-CM | POA: Diagnosis not present

## 2021-10-27 DIAGNOSIS — D509 Iron deficiency anemia, unspecified: Secondary | ICD-10-CM | POA: Diagnosis not present

## 2021-10-27 DIAGNOSIS — Z853 Personal history of malignant neoplasm of breast: Secondary | ICD-10-CM | POA: Diagnosis not present

## 2021-10-27 DIAGNOSIS — Z7984 Long term (current) use of oral hypoglycemic drugs: Secondary | ICD-10-CM | POA: Diagnosis not present

## 2021-10-27 DIAGNOSIS — D539 Nutritional anemia, unspecified: Secondary | ICD-10-CM | POA: Diagnosis not present

## 2021-10-27 DIAGNOSIS — D123 Benign neoplasm of transverse colon: Secondary | ICD-10-CM | POA: Diagnosis not present

## 2021-10-27 DIAGNOSIS — Z8249 Family history of ischemic heart disease and other diseases of the circulatory system: Secondary | ICD-10-CM | POA: Diagnosis not present

## 2021-10-27 DIAGNOSIS — R195 Other fecal abnormalities: Secondary | ICD-10-CM | POA: Diagnosis not present

## 2021-10-27 DIAGNOSIS — Z79899 Other long term (current) drug therapy: Secondary | ICD-10-CM | POA: Diagnosis not present

## 2021-10-27 DIAGNOSIS — I4821 Permanent atrial fibrillation: Secondary | ICD-10-CM | POA: Diagnosis not present

## 2021-10-27 DIAGNOSIS — K760 Fatty (change of) liver, not elsewhere classified: Secondary | ICD-10-CM | POA: Diagnosis not present

## 2021-10-27 DIAGNOSIS — Z820 Family history of epilepsy and other diseases of the nervous system: Secondary | ICD-10-CM | POA: Diagnosis not present

## 2021-10-27 LAB — GLUCOSE, CAPILLARY
Glucose-Capillary: 122 mg/dL — ABNORMAL HIGH (ref 70–99)
Glucose-Capillary: 149 mg/dL — ABNORMAL HIGH (ref 70–99)
Glucose-Capillary: 124 mg/dL — ABNORMAL HIGH (ref 70–99)
Glucose-Capillary: 101 mg/dL — ABNORMAL HIGH (ref 70–99)

## 2021-10-27 LAB — PATHOLOGIST SMEAR REVIEW

## 2021-10-27 LAB — CBC
HCT: 21.6 % — ABNORMAL LOW (ref 36.0–46.0)
Hemoglobin: 6.6 g/dL — CL (ref 12.0–15.0)
MCH: 31.1 pg (ref 26.0–34.0)
MCHC: 30.6 g/dL (ref 30.0–36.0)
MCV: 101.9 fL — ABNORMAL HIGH (ref 80.0–100.0)
Platelets: 64 10*3/uL — ABNORMAL LOW (ref 150–400)
RBC: 2.12 MIL/uL — ABNORMAL LOW (ref 3.87–5.11)
RDW: 24.8 % — ABNORMAL HIGH (ref 11.5–15.5)
WBC: 4.3 10*3/uL (ref 4.0–10.5)
nRBC: 1.9 % — ABNORMAL HIGH (ref 0.0–0.2)

## 2021-10-27 LAB — HEMOGLOBIN AND HEMATOCRIT, BLOOD
HCT: 28.5 % — ABNORMAL LOW (ref 36.0–46.0)
Hemoglobin: 8.9 g/dL — ABNORMAL LOW (ref 12.0–15.0)

## 2021-10-27 LAB — HIV ANTIBODY (ROUTINE TESTING W REFLEX): HIV Screen 4th Generation wRfx: NONREACTIVE

## 2021-10-27 LAB — SURGICAL PATHOLOGY

## 2021-10-27 LAB — PREPARE RBC (CROSSMATCH)

## 2021-10-27 MED ORDER — BISACODYL 5 MG PO TBEC
10.0000 mg | DELAYED_RELEASE_TABLET | Freq: Once | ORAL | Status: AC
  Administered 2021-10-27: 10 mg via ORAL
  Filled 2021-10-27: qty 2

## 2021-10-27 MED ORDER — PEG 3350-KCL-NA BICARB-NACL 420 G PO SOLR
4000.0000 mL | Freq: Once | ORAL | Status: AC
  Administered 2021-10-27: 4000 mL via ORAL

## 2021-10-27 MED ORDER — SODIUM CHLORIDE 0.9% IV SOLUTION: Freq: Once | INTRAVENOUS | Status: AC

## 2021-10-27 NOTE — Progress Notes (Signed)
Gastroenterology Progress Note   Referring Provider: No ref. provider found Primary Care Physician:  [name], MD Primary Gastroenterologist:  Dr. Gala Romney  Patient ID: Kaitlyn Hull; [id number]; January 24, 1956    Subjective   She reports some mild shortness of breath and lightheadedness with walking the unit this morning. She denies any chest pain, abdominal pain, nausea, vomiting, dysphagia, melena, hematochezia, reflux. She reports some looser stools over the last couple of weeks. Tolerating clears well.    Objective   Vital signs in last 24 hours Temp:  [97.9 F (36.6 C)-98.7 F (37.1 C)] 97.9 F (36.6 C) (08/25 0536) Pulse Rate:  [70-103] 94 (08/25 0809) Resp:  [15-27] 20 (08/25 0536) BP: (93-112)/(35-72) 101/68 (08/25 0809) SpO2:  [98 %-100 %] 100 % (08/25 0536) Weight:  [131.3 kg] 131.3 kg (08/24 2305) Last BM Date : 10/27/21  Physical Exam General:   Alert and oriented, pleasant Head:  Normocephalic and atraumatic. Eyes:  No icterus, sclera clear. Conjuctiva pink.  Mouth:  Without lesions, mucosa pink and moist.  Neck:  Supple, without thyromegaly or masses.  Heart:  S1, S2 present, no murmurs noted.  Lungs: Clear to auscultation bilaterally, without wheezing, rales, or rhonchi.  Abdomen:  Bowel sounds present, soft, non-tender, non-distended. No HSM or hernias noted. No rebound or guarding. No masses appreciated  Extremities: Mild non pitting edema to BLE.  Neurologic:  Alert and  oriented x4;  grossly normal neurologically. Skin:  Warm and dry, intact without significant lesions.  Psych:  Alert and cooperative. Normal mood and affect.  Intake/Output from previous day: 08/24 0701 - 08/25 0700 In: 960 [Blood:960] Out: -  Intake/Output this shift: No intake/output data recorded.  Lab Results  Recent Labs    10/26/21 0937 10/26/21 2158  WBC 5.9  --   HGB 4.7* 7.4*  HCT 16.5* 24.2*  PLT 77*  --    BMET Recent Labs    10/26/21 0937  NA 141  K  5.1  CL 114*  CO2 19*  GLUCOSE 146*  BUN 25*  CREATININE 1.32*  CALCIUM 8.5*   LFT Recent Labs    10/26/21 0937  PROT 6.6  ALBUMIN 3.3*  AST 27  ALT 12  ALKPHOS 52  BILITOT 2.0*   PT/INR No results for input(s): "LABPROT", "INR" in the last 72 hours. Hepatitis Panel No results for input(s): "HEPBSAG", "HCVAB", "HEPAIGM", "HEPBIGM" in the last 72 hours.   Studies/Results DG Chest 2 View  Result Date: 10/26/2021 CLINICAL DATA:  Shortness of breath EXAM: CHEST - 2 VIEW COMPARISON:  10/11/2015 FINDINGS: Transverse diameter of heart is slightly increased. There are no signs of pulmonary edema or focal pulmonary consolidation. There is no pleural effusion or pneumothorax. IMPRESSION: There are no signs of pulmonary edema or focal pulmonary consolidation. Electronically Signed   By: Elmer Picker M.D.   On: 10/26/2021 09:19    Assessment  66 y.o. female with a history of morbid obesity, permanent A-fib on Eliquis, history of breast cancer, HTN, psoriasis, diabetes who presented to the ED with progressive shortness of breath and fatigue.  She was noted to have profound microcytic anemia and thrombocytopenia.  GI consulted for further management.  Macrocytic anemia: B12 and folate normal, no evidence of IDA.  She denies any overt GI bleeding however did note mild amount of fresh blood at her umbilicus.  Reticulocyte count elevated.  Hgb 4.7 on admission (10.9 last year), received 3 units PRBCs. Last dose of eliquis was 8/24 in the  morning.  Hemoglobin improved to 7.4 after blood transfusion. Patient with  positive Cologuard and FIT test last year that she was unaware of and has never had colonoscopy due to fear.  Hgb dropped to 6.6 today without any evidence of overt GI bleeding. We will proceed with EGD and colonoscopy tomorrow to allow for 48-hour washout of Eliquis.She will be receiving an additional unit of blood.    I have discussed the risks, alternatives, benefits with regards  to but not limited to the risk of reaction to medication, bleeding, infection, perforation and the patient is agreeable to proceed. Written consent to be obtained. We discussed possibility of capsule endoscopy in the future pending initial work up.   Thrombocytopenia: Remote history of fatty liver noted on ultrasound in 2013.  Unable to rule out cirrhosis given current thrombocytopenia platelets 77.  Of note platelets were normal in September 2022 at 156. May need further work up on outpatient basis.   Plan / Recommendations  Clear liquid diet Continue PPI twice daily Continue to hold Eliquis Prep with bisacodyl and GoLytely today Tap water enema in the morning.  Colonoscopy/EGD with Dr. Gala Romney tomorrow    LOS: 0 days    10/27/2021, 10:03 AM   [name], MSN, FNP-BC, AGACNP-BC [facility]

## 2021-10-27 NOTE — Progress Notes (Addendum)
PROGRESS NOTE     Kaitlyn Hull, is a 66 y.o. female, DOB - 12-Jun-1955, MGN:[id number]  Admit date - 10/26/2021   Admitting Physician Halleigh Comes Denton Brick, MD  Outpatient Primary MD for the patient is [name], MD  LOS - 0  Chief Complaint  Patient presents with   Shortness of Breath        Brief Narrative:   66 y.o. female morbid obesity, permanent A-fib on Eliquis, history of breast cancer, diabetes, hypertension, HLD and CKD 3B admitted on 10/26/2021 with acute severe symptomatic anemia    -Assessment and Plan: 1)Severe Symptomatic Anemia -Hemoglobin 4.7 no recent baseline available, however Care Everywhere reveals hemoglobin was 13.3 and platelets was 212 on 11/26/2019 at [place],  -No obvious bleeding noted, the patient is compliant with  Eliquis PTA -Patient has been having fatigue, dizziness,  dyspnea with activity for about 3 months, over the last week she has had some dyspnea at rest -No prior colonoscopy or EGD -Patient had a positive Cologuard test in October 2022 but patient denies being informed of this abnormal test, and patient had no follow-up for this abnormal Cologuard test --Has used NSAIDs in the past but not in the last month or so Hgb on admission was 4.7, improved initially to 7.4 after 2 units of PRBC -Hgb back down to 6.6---we will give additional 2 units of PRBC for total of 4 units of PRBC this admission -Continue to Hold Eliquis -Continue IV PPI -B12 and folate are not low -Serum iron is normal at 109, TIBC 298 iron saturation high at 37 -Ferritin WNL at 193, reticulocyte count percentage elevated at 4.5%,  immature reticulocyte fraction elevated at 32.9  clear diet for now -GI consult appreciated -Plan is for both EGD and colonoscopy after Eliquis washout on 10/28/2021   2) thrombocytopenia--- platelets 77 >>64 -platelets was 212 on 11/26/2019 at [place] -No obvious bleeding the patient compliant with his Eliquis   3)  elevated D-dimer--- 3.50,  --Suspect this is an acute phase reactant patient is compliant with Eliquis --low clinical index of suspicion for DVT/PE   4)CKD 3B--care everywhere reveals creatinine was 1.07 with a GFR 56 on 11/26/2019 at [place] -Creatinine this admission  is 1.32 with a GFR of 45, suspect CKD progression from CKD 3A to CKD 3B in the setting of underlying hypertension and diabetes rather than AKI   5) chronic atrial fibrillation--- hold Eliquis due to severe anemia and thrombocytopenia with concerns for GI blood loss -Continue Cardizem CD and metoprolol for rate control   6)DM2-previously well controlled -Hold metformin Use Novolog/Humalog Sliding scale insulin with Accu-Cheks/Fingersticks as ordered    7)HLD----continue pravastatin   8)Morbid Obesity- -Low calorie diet, portion control and increase physical activity discussed with patient -Body mass index is 51.21 kg/m.  9)Possible Acute Leukemia----Discussed with Arby Barrette (pathologist at [facility]  at [phone])--there is high suspicion for acute Leukemia on the peripheral smear (blast cell), awaiting flow cytometry for confirmation -Called and discussed with Dr. Delton Coombes states that that WBC and ANC appears acceptable at this time, he recommends outpatient follow-up with him on Monday, 10/30/2021     Disposition/Need for in-Hospital Stay- patient unable to be discharged at this time due to --severe symptomatic anemia and thrombocytopenia requiring transfusion of PRBC and possible endoluminal evaluation after Eliquis washout     Dispo: The patient is from: Home              Anticipated d/c is to: Home  Anticipated d/c date is: 2 days              Patient currently is not medically stable to d/c. Barriers: Not Clinically Stable-   Code Status :  -  Code Status: Full Code   Family Communication:    NA (patient is alert, awake and coherent)   DVT Prophylaxis  :   - SCDs  SCDs Start: 10/26/21  1858 Place TED hose Start: 10/26/21 1858   Lab Results  Component Value Date   PLT 64 (L) 10/27/2021    Inpatient Medications  Scheduled Meds:  sodium chloride   Intravenous Once   diltiazem  120 mg Oral BID   insulin aspart  0-5 Units Subcutaneous QHS   insulin aspart  0-6 Units Subcutaneous TID WC   metoprolol tartrate  50 mg Oral BID   pantoprazole (PROTONIX) IV  40 mg Intravenous Q12H   polyethylene glycol  17 g Oral Daily   pravastatin  40 mg Oral QHS   sodium chloride flush  3 mL Intravenous Q12H   sodium chloride flush  3 mL Intravenous Q12H   Continuous Infusions:  sodium chloride     sodium chloride     PRN Meds:.sodium chloride, acetaminophen **OR** acetaminophen, bisacodyl, ondansetron **OR** ondansetron (ZOFRAN) IV, sodium chloride flush, traZODone   Anti-infectives (From admission, onward)    None       Subjective: Kaitlyn Hull today has no fevers, no emesis,  No chest pain,   -Fatigue, dizziness and dyspnea on exertion is not worse -No hematemesis or hematochezia, denies dark stools   Objective: Vitals:   10/26/21 2205 10/26/21 2305 10/27/21 0536 10/27/21 0809  BP: 112/60 (!) 107/52 (!) 95/47 101/68  Pulse: 89 70 86 94  Resp: '18 19 20   '$ Temp: 98 F (36.7 C) 98.7 F (37.1 C) 97.9 F (36.6 C)   TempSrc:   Oral   SpO2: 99% 98% 100%   Weight:  131.3 kg    Height:  '5\' 2"'$  (1.575 m)      Intake/Output Summary (Last 24 hours) at 10/27/2021 1055 Last data filed at 10/27/2021 0900 Gross per 24 hour  Intake 1200 ml  Output --  Net 1200 ml   Filed Weights   10/26/21 0846 10/26/21 2305  Weight: 127 kg 131.3 kg   Physical Exam  Gen:- Awake Alert, morbidly obese, in no acute distress HEENT:- [place].AT, No sclera icterus, pale sclera Neck-Supple Neck,No JVD,.  Lungs-  CTAB , fair symmetrical air movement CV- S1, S2 normal, regular  Abd-  +ve B.Sounds, Abd Soft, No tenderness, increased truncal adiposity Extremity/Skin:- No  edema, pedal pulses  present , very pale Psych-affect is appropriate, oriented x3 Neuro-generalized weakness, no new focal deficits, no tremors  Data Reviewed: I have personally reviewed following labs and imaging studies  CBC: Recent Labs  Lab 10/26/21 0937 10/26/21 2158 10/27/21 0958  WBC 5.9  --  4.3  NEUTROABS 2.3  --   --   HGB 4.7* 7.4* 6.6*  HCT 16.5* 24.2* 21.6*  MCV 113.0*  --  101.9*  PLT 77*  --  64*   Basic Metabolic Panel: Recent Labs  Lab 10/26/21 0937  NA 141  K 5.1  CL 114*  CO2 19*  GLUCOSE 146*  BUN 25*  CREATININE 1.32*  CALCIUM 8.5*   GFR: Estimated Creatinine Clearance: 56.1 mL/min (A) (by C-G formula based on SCr of 1.32 mg/dL (H)). Liver Function Tests: Recent Labs  Lab 10/26/21 0937  AST 27  ALT 12  ALKPHOS 52  BILITOT 2.0*  PROT 6.6  ALBUMIN 3.3*   Radiology Studies: DG Chest 2 View  Result Date: 10/26/2021 CLINICAL DATA:  Shortness of breath EXAM: CHEST - 2 VIEW COMPARISON:  10/11/2015 FINDINGS: Transverse diameter of heart is slightly increased. There are no signs of pulmonary edema or focal pulmonary consolidation. There is no pleural effusion or pneumothorax. IMPRESSION: There are no signs of pulmonary edema or focal pulmonary consolidation. Electronically Signed   By: Elmer Picker M.D.   On: 10/26/2021 09:19     Scheduled Meds:  sodium chloride   Intravenous Once   diltiazem  120 mg Oral BID   insulin aspart  0-5 Units Subcutaneous QHS   insulin aspart  0-6 Units Subcutaneous TID WC   metoprolol tartrate  50 mg Oral BID   pantoprazole (PROTONIX) IV  40 mg Intravenous Q12H   polyethylene glycol  17 g Oral Daily   pravastatin  40 mg Oral QHS   sodium chloride flush  3 mL Intravenous Q12H   sodium chloride flush  3 mL Intravenous Q12H   Continuous Infusions:  sodium chloride     sodium chloride       LOS: 0 days    Roxan Hockey M.D on 10/27/2021 at 10:55 AM  Go to [email or web address] - for contact info  [facility] - Office   [phone]  If 7PM-7AM, please contact night-coverage [email or web address] 10/27/2021, 10:55 AM

## 2021-10-27 NOTE — Progress Notes (Signed)
Lab called critical value 6.6 hgb MD notified.

## 2021-10-27 NOTE — Progress Notes (Signed)
  Transition of Care [facility]) Screening Note   Patient Details  Name: Kaitlyn Hull Date of Birth: 06-10-1955   Transition of Care [facility]) CM/SW Contact:    Ihor Gully, LCSW Phone Number: 10/27/2021, 9:41 AM    Transition of Care Department [facility]) has reviewed patient and no TOC needs have been identified at this time. We will continue to monitor patient advancement through interdisciplinary progression rounds. If new patient transition needs arise, please place a TOC consult.

## 2021-10-28 ENCOUNTER — Inpatient Hospital Stay (HOSPITAL_COMMUNITY): Payer: 59 | Admitting: Anesthesiology

## 2021-10-28 ENCOUNTER — Encounter (HOSPITAL_COMMUNITY)
Admission: EM | Disposition: A | Discharge status: Home / Self Care | Payer: Self-pay | Source: Home / Self Care | Attending: Family Medicine

## 2021-10-28 ENCOUNTER — Encounter (HOSPITAL_COMMUNITY): Payer: Self-pay | Admitting: Family Medicine

## 2021-10-28 DIAGNOSIS — R799 Abnormal finding of blood chemistry, unspecified: Secondary | ICD-10-CM

## 2021-10-28 DIAGNOSIS — D123 Benign neoplasm of transverse colon: Secondary | ICD-10-CM

## 2021-10-28 DIAGNOSIS — R195 Other fecal abnormalities: Secondary | ICD-10-CM

## 2021-10-28 HISTORY — PX: COLONOSCOPY WITH PROPOFOL: SHX5780

## 2021-10-28 HISTORY — PX: ESOPHAGOGASTRODUODENOSCOPY (EGD) WITH PROPOFOL: SHX5813

## 2021-10-28 LAB — TYPE AND SCREEN
ABO/RH(D): A POS
Antibody Screen: NEGATIVE
Unit division: 0
Unit division: 0
Unit division: 0
Unit division: 0

## 2021-10-28 LAB — COMPREHENSIVE METABOLIC PANEL
ALT: 8 U/L (ref 0–44)
AST: 15 U/L (ref 15–41)
Albumin: 3.1 g/dL — ABNORMAL LOW (ref 3.5–5.0)
Alkaline Phosphatase: 49 U/L (ref 38–126)
Anion gap: 6 (ref 5–15)
BUN: 17 mg/dL (ref 8–23)
CO2: 20 mmol/L — ABNORMAL LOW (ref 22–32)
Calcium: 8.2 mg/dL — ABNORMAL LOW (ref 8.9–10.3)
Chloride: 113 mmol/L — ABNORMAL HIGH (ref 98–111)
Creatinine, Ser: 1.09 mg/dL — ABNORMAL HIGH (ref 0.44–1.00)
GFR, Estimated: 57 mL/min — ABNORMAL LOW (ref >60)
Glucose, Bld: 105 mg/dL — ABNORMAL HIGH (ref 70–99)
Potassium: 3.9 mmol/L (ref 3.5–5.1)
Sodium: 139 mmol/L (ref 135–145)
Total Bilirubin: 2.6 mg/dL — ABNORMAL HIGH (ref 0.3–1.2)
Total Protein: 6.2 g/dL — ABNORMAL LOW (ref 6.5–8.1)

## 2021-10-28 LAB — LACTATE DEHYDROGENASE: LDH: 253 U/L — ABNORMAL HIGH (ref 98–192)

## 2021-10-28 LAB — PROTIME-INR
INR: 1.3 — ABNORMAL HIGH (ref 0.8–1.2)
Prothrombin Time: 15.6 seconds — ABNORMAL HIGH (ref 11.4–15.2)

## 2021-10-28 LAB — BPAM RBC
Blood Product Expiration Date: 202309012359
Blood Product Expiration Date: 202309172359
Blood Product Expiration Date: 202309172359
Blood Product Expiration Date: 202309172359
ISSUE DATE / TIME: 202308241358
ISSUE DATE / TIME: 202308241547
ISSUE DATE / TIME: 202308251137
ISSUE DATE / TIME: 202308251510
Unit Type and Rh: 6200
Unit Type and Rh: 6200
Unit Type and Rh: 6200
Unit Type and Rh: 6200

## 2021-10-28 LAB — GLUCOSE, CAPILLARY
Glucose-Capillary: 111 mg/dL — ABNORMAL HIGH (ref 70–99)
Glucose-Capillary: 108 mg/dL — ABNORMAL HIGH (ref 70–99)
Glucose-Capillary: 125 mg/dL — ABNORMAL HIGH (ref 70–99)
Glucose-Capillary: 140 mg/dL — ABNORMAL HIGH (ref 70–99)

## 2021-10-28 LAB — APTT: aPTT: 28 seconds (ref 24–36)

## 2021-10-28 LAB — URIC ACID: Uric Acid, Serum: 9.4 mg/dL — ABNORMAL HIGH (ref 2.5–7.1)

## 2021-10-28 LAB — FIBRINOGEN: Fibrinogen: 395 mg/dL (ref 210–475)

## 2021-10-28 SURGERY — ESOPHAGOGASTRODUODENOSCOPY (EGD) WITH PROPOFOL
Anesthesia: General

## 2021-10-28 MED ORDER — PROPOFOL 10 MG/ML IV BOLUS
INTRAVENOUS | Status: DC | PRN
  Administered 2021-10-28: 160 mg via INTRAVENOUS
  Administered 2021-10-28: 180 mg via INTRAVENOUS

## 2021-10-28 MED ORDER — METOPROLOL TARTRATE 5 MG/5ML IV SOLN
5.0000 mg | Freq: Once | INTRAVENOUS | Status: AC
  Administered 2021-10-28: 5 mg via INTRAVENOUS
  Filled 2021-10-28: qty 5

## 2021-10-28 MED ORDER — SODIUM CHLORIDE 0.9 % IV SOLN
INTRAVENOUS | Status: DC
Start: 2021-10-28 — End: 2021-10-30

## 2021-10-28 MED ORDER — LACTATED RINGERS IV SOLN: INTRAVENOUS | Status: DC

## 2021-10-28 MED ORDER — LACTATED RINGERS IV SOLN: INTRAVENOUS | Status: DC | PRN

## 2021-10-28 NOTE — Anesthesia Preprocedure Evaluation (Signed)
Anesthesia Evaluation  Patient identified by MRN, date of birth, ID band Patient awake    Reviewed: Allergy & Precautions, H&P , NPO status , Patient's Chart, lab work & pertinent test results, reviewed documented beta blocker date and time   Airway Mallampati: II  TM Distance: >3 FB Neck ROM: full    Dental no notable dental hx.    Pulmonary neg pulmonary ROS, former smoker,    Pulmonary exam normal breath sounds clear to auscultation       Cardiovascular Exercise Tolerance: Good hypertension, negative cardio ROS   Rhythm:regular Rate:Normal     Neuro/Psych negative neurological ROS  negative psych ROS   GI/Hepatic Neg liver ROS, GERD  Medicated,  Endo/Other  diabetes, Type 2Hyperthyroidism Morbid obesity  Renal/GU negative Renal ROS  negative genitourinary   Musculoskeletal   Abdominal   Peds  Hematology  (+) Blood dyscrasia, anemia ,   Anesthesia Other Findings   Reproductive/Obstetrics negative OB ROS                             Anesthesia Physical Anesthesia Plan  ASA: 3 and emergent  Anesthesia Plan: General   Post-op Pain Management:    Induction:   PONV Risk Score and Plan: Propofol infusion  Airway Management Planned:   Additional Equipment:   Intra-op Plan:   Post-operative Plan:   Informed Consent: I have reviewed the patients History and Physical, chart, labs and discussed the procedure including the risks, benefits and alternatives for the proposed anesthesia with the patient or authorized representative who has indicated his/her understanding and acceptance.     Dental Advisory Given  Plan Discussed with: CRNA  Anesthesia Plan Comments:         Anesthesia Quick Evaluation

## 2021-10-28 NOTE — H&P (Signed)
  Patient seen and examined in short stay.  Stable overnight.  Discussed diagnostic EGD and colonoscopy (positive Cologuard).   she denies dysphagia or any upper GI tract symptoms at this time.  I discussed with Dr. Joesph Fillers earlier this morning.   Patient remains pancytopenic.  Hemoglobin 8.0 this morning.  Clinically, no evidence of bleeding.  Agree with need to proceed with a EGD and colonoscopy to complete her inpatient GI evaluation.  Risk, benefits, limitations and imponderables have been reviewed.  Questions answered patient is agreeable  Further recommendations to follow.

## 2021-10-28 NOTE — Anesthesia Postprocedure Evaluation (Signed)
Anesthesia Post Note  Patient: Kaitlyn Hull  Procedure(s) Performed: ESOPHAGOGASTRODUODENOSCOPY (EGD) WITH PROPOFOL COLONOSCOPY WITH PROPOFOL  Patient location during evaluation: PACU Anesthesia Type: General Level of consciousness: awake and alert Pain management: pain level controlled Vital Signs Assessment: post-procedure vital signs reviewed and stable Respiratory status: spontaneous breathing, nonlabored ventilation, respiratory function stable and patient connected to nasal cannula oxygen Cardiovascular status: blood pressure returned to baseline and stable Postop Assessment: no apparent nausea or vomiting Anesthetic complications: no   No notable events documented.   Last Vitals:  Vitals:   10/28/21 1000 10/28/21 1035  BP: 94/75 129/85  Pulse:  98  Resp: (!) 23 (!) 26  Temp: 36.8 C 37 C  SpO2: 98% (!) 87%    Last Pain:  Vitals:   10/28/21 1000  TempSrc: Oral  PainSc: 0-No pain                 Louann Sjogren

## 2021-10-28 NOTE — Progress Notes (Signed)
Provided emotional support to patient and family. Placed on neutropenic precautions. Call light within reach. Bed in lowest position will continue to monitor.

## 2021-10-28 NOTE — Op Note (Addendum)
[facility] Patient Name: Kaitlyn Hull Procedure Date: 10/28/2021 9:51 AM MRN: [id number] Date of Birth: [birth year] Attending MD: Norvel Richards , MD CSN: [id number] Age: 66 Admit Type: Inpatient Procedure:                Upper GI endoscopy Indications:              Heme positive stool Providers:                Norvel Richards, MD, Lurline Del, RN, Casimer Bilis, Technician Referring MD:              Medicines:                Propofol per Anesthesia Complications:            No immediate complications. Estimated Blood Loss:     Estimated blood loss: none. Procedure:                Pre-Anesthesia Assessment:                           - Prior to the procedure, a History and Physical                            was performed, and patient medications and                            allergies were reviewed. The patient's tolerance of                            previous anesthesia was also reviewed. The risks                            and benefits of the procedure and the sedation                            options and risks were discussed with the patient.                            All questions were answered, and informed consent                            was obtained. ASA Grade Assessment: III - A patient                            with severe systemic disease. After reviewing the                            risks and benefits, the patient was deemed in                            satisfactory condition to undergo the procedure.  After obtaining informed consent, the endoscope was                            passed under direct vision. Throughout the                            procedure, the patient's blood pressure, pulse, and                            oxygen saturations were monitored continuously.The                            upper GI endoscopy was accomplished without                            difficulty. The  patient tolerated the procedure                            well. The [id number]-H190 ([id number] scope was introduced                            through the mouth, and advanced to the second part                            of duodenum. Scope In: 10:04:42 AM Scope Out: 10:08:17 AM Total Procedure Duration: 0 hours 3 minutes 35 seconds  Findings:      The examined esophagus was normal. Focal erythema and superficial       erosion within 5 mm of GE junction. No Barrett's epithelium or other       abnormality observed.      Aside from minimally polypoid appearing mucosa, the entire examined       stomach was normal.      The duodenal bulb and second portion of the duodenum were normal. Impression:               - Normal esophagus. Minimal reflux changes.                           -Minimally polypoid gastric mucosa..                           - Normal duodenal bulb and second portion of the                            duodenum.                           - No specimens collected. Moderate Sedation:      Moderate (conscious) sedation was personally administered by an       anesthesia professional. The following parameters were monitored: oxygen       saturation, heart rate, blood pressure, respiratory rate, EKG, adequacy       of pulmonary ventilation, and response to care. Total physician       intraservice time was 15 minutes. Recommendation:           - Patient has a contact  number available for                            emergencies. The signs and symptoms of potential                            delayed complications were discussed with the                            patient. Return to normal activities tomorrow.                            Written discharge instructions were provided to the                            patient.                           - Advance diet as tolerated.                           - Return patient to hospital ward for observation.                           - Advance diet  as tolerated. May resume Eliquis                            anytime.                           - Continue present medications. See colonoscopy                            report. Agree with further hematology evaluation.                            Discussed with Dr. Joesph Fillers earlier today. Procedure Code(s):        --- Professional ---                           [phone], Esophagogastroduodenoscopy, flexible,                            transoral; diagnostic, including collection of                            specimen(s) by brushing or washing, when performed                            (separate procedure) Diagnosis Code(s):        --- Professional ---                           R19.5, Other fecal abnormalities CPT copyright 2019 American Medical Association. All rights reserved. The codes documented in this report are preliminary and upon coder review may  be revised to meet current compliance requirements. Herbie Baltimore  Hilton Cork, MD Norvel Richards, MD 10/28/2021 10:41:09 AM This report has been signed electronically. Number of Addenda: 0

## 2021-10-28 NOTE — Progress Notes (Addendum)
PROGRESS NOTE     Kaitlyn Hull, is a 66 y.o. female, DOB - 07-20-1955, HDQ:[id number]  Admit date - 10/26/2021   Admitting Physician Gean Laursen Denton Brick, MD  Outpatient Primary MD for the patient is Kaitlyn Rio, MD  LOS - 1  Chief Complaint  Patient presents with   Shortness of Breath        Brief Narrative:   66 y.o. female morbid obesity, permanent A-fib on Eliquis, history of breast cancer, diabetes, hypertension, HLD and CKD 3B admitted on 10/26/2021 with acute severe symptomatic anemia    -Assessment and Plan: 1)Severe Symptomatic Anemia -Hemoglobin 4.7 no recent baseline available, however Care Everywhere reveals hemoglobin was 13.3 and platelets was 212 on 11/26/2019 at [place],  -No obvious bleeding noted, the patient is compliant with  Eliquis PTA -Patient has been having fatigue, dizziness,  dyspnea with activity for about 3 months, over the last week she has had some dyspnea at rest -No prior colonoscopy or EGD -Patient had a positive Cologuard test in October 2022 but patient denies being informed of this abnormal test, and patient had no follow-up for this abnormal Cologuard test --Has used NSAIDs in the past but not in the last month or so Hgb on admission was 4.7,  -Hgb up to 8.0 after 4 units of PRBC transfusion this admission -Continue to Hold Eliquis -Continue IV PPI -B12 and folate are not low -Serum iron is normal at 109, TIBC 298 iron saturation high at 37 -Ferritin WNL at 193, reticulocyte count percentage elevated at 4.5%,  immature reticulocyte fraction elevated at 32.9 -GI consult appreciated -Status post EGD and colonoscopy after Eliquis washout on 10/28/2021 without acute findings on either EGD or colonoscopy   2)Acute Myeloid Leukemia with severe anemia and severe thrombocytopenia- ------Discussed with [place] (pathologist at [facility]  at [phone])--there is high suspicion for acute Leukemia on the peripheral smear (blast cell), suspicion of  acute myeloid leukemia confirmed by flow cytometry -Called and discussed with Dr. Delton Coombes , anticipate the patient will need to go to [facility] for bone marrow biopsy and induction chemotherapy absolute Neutrophil count is down to 1.2 from 2.3 on 10/26/2021   -Platelets down to 56.... LDH up at  253, INR  1.3 , uric acid up  to 9.4.Marland KitchenMarland KitchenMarland KitchenHgb 8.0 after 4 units of PRBC    3)Elevated D-dimer--- 3.50 especially in the setting of acute leukemia --Suspect this is an acute phase reactant patient was compliant with Eliquis PTA --low clinical index of suspicion for DVT/PE   4) AKI on CKD 3B--care everywhere reveals creatinine was 1.07 with a GFR 56 on 11/26/2019 at [place] -Creatinine this admission  is 1.32 with a GFR of 45,  -Creatinine down to 1.0  5) chronic atrial fibrillation--- hold Eliquis due to severe anemia and thrombocytopenia with concerns for GI blood loss -Continue Cardizem CD and metoprolol for rate control   6)DM2-previously well controlled -Hold metformin Use Novolog/Humalog Sliding scale insulin with Accu-Cheks/Fingersticks as ordered    7)HLD----continue pravastatin   8)Morbid Obesity- -Low calorie diet, portion control and increase physical activity discussed with patient -Body mass index is 52.94 kg/m.   9)Thrombocytopenia--- platelets 77 >>64>>56 -platelets was 212 on 11/26/2019 at [place] -No obvious bleeding the patient compliant with his Eliquis  Disposition/Need for in-Hospital Stay- patient unable to be discharged at this time due to --severe symptomatic anemia and thrombocytopenia requiring transfusion of PRBC , patient will probably need transfer to [facility]  for bone marrow biopsy     Dispo: The patient is from: Home              Anticipated d/c is to: [facility]              Anticipated d/c date is: 2 days              Patient currently is not medically stable to  d/c. Barriers: Not Clinically Stable-   Code Status :  -  Code Status: Full Code   Family Communication:     (patient is alert, awake and coherent)  Updated patient's daughter Kaitlyn Hull  ([phone]) at patient's request  DVT Prophylaxis  :   - SCDs  SCDs Start: 10/26/21 1858 Place TED hose Start: 10/26/21 1858   Lab Results  Component Value Date   PLT 56 (L) 10/28/2021   Inpatient Medications  Scheduled Meds:  diltiazem  120 mg Oral BID   insulin aspart  0-5 Units Subcutaneous QHS   insulin aspart  0-6 Units Subcutaneous TID WC   metoprolol tartrate  50 mg Oral BID   pantoprazole (PROTONIX) IV  40 mg Intravenous Q12H   pravastatin  40 mg Oral QHS   sodium chloride flush  3 mL Intravenous Q12H   sodium chloride flush  3 mL Intravenous Q12H   Continuous Infusions:  sodium chloride     sodium chloride     sodium chloride 20 mL/hr at 10/28/21 1246   lactated ringers 10 mL/hr at 10/28/21 1246   PRN Meds:.sodium chloride, acetaminophen **OR** acetaminophen, bisacodyl, ondansetron **OR** ondansetron (ZOFRAN) IV, sodium chloride flush, traZODone   Anti-infectives (From admission, onward)    None       Subjective: Kaitlyn Hull today has no fevers, no emesis,  No chest pain,   -No acute bleeding concerns -Tolerated EGD and colonoscopy well -Fatigue and dyspnea on exertion persist   Objective: Vitals:   10/28/21 1000 10/28/21 1035 10/28/21 1045 10/28/21 1220  BP: 94/75 129/85 (!) 90/55 108/67  Pulse:  98  84  Resp: (!) 23 (!) 26  18  Temp: 98.3 F (36.8 C) 98.6 F (37 C)  97.6 F (36.4 C)  TempSrc: Oral   Oral  SpO2: 98% 98%  100%  Weight:      Height:        Intake/Output Summary (Last 24 hours) at 10/28/2021 1618 Last data filed at 10/28/2021 1500 Gross per 24 hour  Intake 546.72 ml  Output --  Net 546.72 ml   Filed Weights   10/26/21 0846 10/26/21 2305  Weight: 127 kg 131.3 kg   Physical Exam  Gen:- Awake Alert, morbidly obese, in no acute  distress HEENT:- [place].AT, No sclera icterus, pale sclera Neck-Supple Neck,No JVD,.  Lungs-  CTAB , fair symmetrical air movement CV- S1, S2 normal, irregular  Abd-  +ve B.Sounds, Abd Soft, No tenderness, increased truncal adiposity Extremity/Skin:- No  edema, pedal pulses present , very pale, few areas of bruising/ecchymosis, no petechia or purpura Psych-affect is appropriate, oriented x3 Neuro-generalized weakness, no new focal deficits, no tremors  Data Reviewed: I have personally reviewed following labs and imaging studies  CBC: Recent Labs  Lab 10/26/21 0937 10/26/21 2158 10/27/21 0958 10/27/21 1928 10/28/21 0506  WBC 5.9  --  4.3  --  3.9*  NEUTROABS 2.3  --   --   --  1.2*  HGB 4.7* 7.4* 6.6* 8.9* 8.0*  HCT 16.5* 24.2* 21.6* 28.5*  25.3*  MCV 113.0*  --  101.9*  --  95.1  PLT 77*  --  64*  --  56*   Basic Metabolic Panel: Recent Labs  Lab 10/26/21 0937 10/28/21 0506  NA 141 139  K 5.1 3.9  CL 114* 113*  CO2 19* 20*  GLUCOSE 146* 105*  BUN 25* 17  CREATININE 1.32* 1.09*  CALCIUM 8.5* 8.2*   GFR: Estimated Creatinine Clearance: 68 mL/min (A) (by C-G formula based on SCr of 1.09 mg/dL (H)). Liver Function Tests: Recent Labs  Lab 10/26/21 0937 10/28/21 0506  AST 27 15  ALT 12 8  ALKPHOS 52 49  BILITOT 2.0* 2.6*  PROT 6.6 6.2*  ALBUMIN 3.3* 3.1*   Radiology Studies: No results found.   Scheduled Meds:  diltiazem  120 mg Oral BID   insulin aspart  0-5 Units Subcutaneous QHS   insulin aspart  0-6 Units Subcutaneous TID WC   metoprolol tartrate  50 mg Oral BID   pantoprazole (PROTONIX) IV  40 mg Intravenous Q12H   pravastatin  40 mg Oral QHS   sodium chloride flush  3 mL Intravenous Q12H   sodium chloride flush  3 mL Intravenous Q12H   Continuous Infusions:  sodium chloride     sodium chloride     sodium chloride 20 mL/hr at 10/28/21 1246   lactated ringers 10 mL/hr at 10/28/21 1246     LOS: 1 Hull    Roxan Hockey M.D on 10/28/2021 at 4:18  PM  Go to [email or web address] - for contact info  [facility] - Office  [phone]  If 7PM-7AM, please contact night-coverage [email or web address] 10/28/2021, 4:18 PM

## 2021-10-28 NOTE — Transfer of Care (Signed)
Immediate Anesthesia Transfer of Care Note  Patient: Kaitlyn Hull  Procedure(s) Performed: ESOPHAGOGASTRODUODENOSCOPY (EGD) WITH PROPOFOL COLONOSCOPY WITH PROPOFOL  Patient Location: PACU  Anesthesia Type:General  Level of Consciousness: awake and alert   Airway & Oxygen Therapy: Patient Spontanous Breathing and Patient connected to nasal cannula oxygen  Post-op Assessment: Report given to RN and Post -op Vital signs reviewed and stable  Post vital signs: Reviewed and stable  Last Vitals:  Vitals Value Taken Time  BP 129/58   Temp 98.6   Pulse 98 10/28/21 1036  Resp 26 10/28/21 1036  SpO2 87 % 10/28/21 1036  Vitals shown include unvalidated device data.  Last Pain:  Vitals:   10/28/21 1000  TempSrc: Oral  PainSc: 0-No pain      Patients Stated Pain Goal: 7 (17/47/15 9539)  Complications: No notable events documented.

## 2021-10-28 NOTE — Op Note (Addendum)
[facility] Patient Name: Kaitlyn Hull Procedure Date: 10/28/2021 9:49 AM MRN: [id number] Date of Birth: 05/31/1955 Attending MD: Norvel Richards , MD CSN: [id number] Age: 66 Admit Type: Inpatient Procedure:                Colonoscopy Indications:              Positive fecal immunochemical test, Positive                            Cologuard test Providers:                Norvel Richards, MD, Lurline Del, RN, Casimer Bilis, Technician Referring MD:              Medicines:                Propofol per Anesthesia Complications:            No immediate complications. Estimated Blood Loss:     Estimated blood loss: none. Procedure:                Pre-Anesthesia Assessment:                           - Prior to the procedure, a History and Physical                            was performed, and patient medications and                            allergies were reviewed. The patient's tolerance of                            previous anesthesia was also reviewed. The risks                            and benefits of the procedure and the sedation                            options and risks were discussed with the patient.                            All questions were answered, and informed consent                            was obtained. Prior Anticoagulants: The patient has                            taken no previous anticoagulant or antiplatelet                            agents. ASA Grade Assessment: III - A patient with                            severe  systemic disease. After reviewing the risks                            and benefits, the patient was deemed in                            satisfactory condition to undergo the procedure.                           After obtaining informed consent, the colonoscope                            was passed under direct vision. Throughout the                            procedure, the patient's blood  pressure, pulse, and                            oxygen saturations were monitored continuously. The                            [id number] scope was introduced through the                            anus and advanced to the the cecum, identified by                            appendiceal orifice and ileocecal valve. The                            colonoscopy was performed without difficulty. The                            patient tolerated the procedure well. The quality                            of the bowel preparation was adequate. Scope In: 10:14:45 AM Scope Out: 10:30:42 AM Scope Withdrawal Time: 0 hours 10 minutes 0 seconds  Total Procedure Duration: 0 hours 15 minutes 57 seconds  Findings:      The perianal and digital rectal examinations were normal.      A 6 mm polyp was found in the splenic flexure. The polyp was sessile.       The polyp was removed with a cold snare. Resection and retrieval were       complete. Estimated blood loss was minimal.      Non-bleeding internal hemorrhoids were found during retroflexion. The       hemorrhoids were mild, small and Grade I (internal hemorrhoids that do       not prolapse).      The exam was otherwise without abnormality on direct and retroflexion       views. Impression:               - One 6 mm polyp at the splenic flexure, removed  with a cold snare. Resected and retrieved.                           - Non-bleeding internal hemorrhoids.                           - The examination was otherwise normal on direct                            and retroflexion views. Moderate Sedation:      Moderate (conscious) sedation was personally administered by an       anesthesia professional. The following parameters were monitored: oxygen       saturation, heart rate, blood pressure, respiratory rate, EKG, adequacy       of pulmonary ventilation, and response to care. Recommendation:           - Return patient to  hospital ward for ongoing care.                           - Advance diet as tolerated.                           - Continue present medications.                           - Repeat colonoscopy date to be determined after                            pending pathology results are reviewed for                            surveillance.                           - Return to GI office (date not yet determined).                            Continue with hematology evaluation as discussed                            with encourage earlier today. From a GI standpoint,                            could resume Eliquis anytime. See EGD report. Procedure Code(s):        --- Professional ---                           [phone], Colonoscopy, flexible; with removal of                            tumor(s), polyp(s), or other lesion(s) by snare                            technique Diagnosis Code(s):        --- Professional ---  K63.5, Polyp of colon                           K64.0, First degree hemorrhoids                           R19.5, Other fecal abnormalities CPT copyright 2019 American Medical Association. All rights reserved. The codes documented in this report are preliminary and upon coder review may  be revised to meet current compliance requirements. Cristopher Estimable. Decklin Weddington, MD Norvel Richards, MD 10/28/2021 10:39:12 AM This report has been signed electronically. Number of Addenda: 0

## 2021-10-29 DIAGNOSIS — R799 Abnormal finding of blood chemistry, unspecified: Secondary | ICD-10-CM | POA: Diagnosis not present

## 2021-10-29 LAB — CBC WITH DIFFERENTIAL/PLATELET
Abs Immature Granulocytes: 0.05 10*3/uL (ref 0.00–0.07)
Basophils Absolute: 0 10*3/uL (ref 0.0–0.1)
Basophils Relative: 0 %
Eosinophils Absolute: 0 10*3/uL (ref 0.0–0.5)
Eosinophils Relative: 0 %
HCT: 24.2 % — ABNORMAL LOW (ref 36.0–46.0)
Hemoglobin: 7.6 g/dL — ABNORMAL LOW (ref 12.0–15.0)
Immature Granulocytes: 1 %
Lymphocytes Relative: 31 %
Lymphs Abs: 1.1 10*3/uL (ref 0.7–4.0)
MCH: 30.2 pg (ref 26.0–34.0)
MCHC: 31.4 g/dL (ref 30.0–36.0)
MCV: 96 fL (ref 80.0–100.0)
Monocytes Absolute: 1.4 10*3/uL — ABNORMAL HIGH (ref 0.1–1.0)
Monocytes Relative: 38 %
Neutro Abs: 1.1 10*3/uL — ABNORMAL LOW (ref 1.7–7.7)
Neutrophils Relative %: 30 %
Platelets: 51 10*3/uL — ABNORMAL LOW (ref 150–400)
RBC: 2.52 MIL/uL — ABNORMAL LOW (ref 3.87–5.11)
RDW: 24 % — ABNORMAL HIGH (ref 11.5–15.5)
WBC: 3.6 10*3/uL — ABNORMAL LOW (ref 4.0–10.5)
nRBC: 0.8 % — ABNORMAL HIGH (ref 0.0–0.2)

## 2021-10-29 LAB — GLUCOSE, CAPILLARY
Glucose-Capillary: 114 mg/dL — ABNORMAL HIGH (ref 70–99)
Glucose-Capillary: 143 mg/dL — ABNORMAL HIGH (ref 70–99)
Glucose-Capillary: 117 mg/dL — ABNORMAL HIGH (ref 70–99)
Glucose-Capillary: 151 mg/dL — ABNORMAL HIGH (ref 70–99)

## 2021-10-29 NOTE — Progress Notes (Signed)
PROGRESS NOTE     Kaitlyn Hull, is a 66 y.o. female, DOB - May 15, 1955, EVO:[id number]  Admit date - 10/26/2021   Admitting Physician Kaitlyn Vaquerano Denton Brick, MD  Outpatient Primary MD for the patient is Kaitlyn Rio, MD  LOS - 2  Chief Complaint  Patient presents with   Shortness of Breath        Brief Narrative:   66 y.o. female morbid obesity, permanent A-fib on Eliquis, history of breast cancer, diabetes, hypertension, HLD and CKD 3B admitted on 10/26/2021 with acute severe symptomatic anemia    -Assessment and Plan: 1)Severe Symptomatic Anemia -Hemoglobin 4.7 no recent baseline available, however Care Everywhere reveals hemoglobin was 13.3 and platelets was 212 on 11/26/2019 at [facility],  -No obvious bleeding noted, the patient is compliant with  Eliquis PTA -Patient has been having fatigue, dizziness,  dyspnea with activity for about 3 months, over the last week she has had some dyspnea at rest -No prior colonoscopy or EGD -Patient had a positive Cologuard test in October 2022 but patient denies being informed of this abnormal test, and patient had no follow-up for this abnormal Cologuard test --Has used NSAIDs in the past but not in the last month or so Hgb on admission was 4.7,  -Hgb trending back down to 7.6 -patient has received 4 units of PRBC transfusion this admission -Continue to Hold Eliquis -Continue IV PPI -B12 and folate are not low -Serum iron is normal at 109, TIBC 298 iron saturation high at 37 -Ferritin WNL at 193, reticulocyte count percentage elevated at 4.5%,  immature reticulocyte fraction elevated at 32.9 -GI consult appreciated -Status post EGD and colonoscopy after Eliquis washout on 10/28/2021 without acute findings on either EGD or colonoscopy   2)Acute Myeloid Leukemia with severe anemia and severe thrombocytopenia- ------Discussed with Kaitlyn [facility] (pathologist at [facility]  at [phone])--there is high suspicion for acute Leukemia on the peripheral  smear (blast cell), suspicion of acute myeloid leukemia confirmed by flow cytometry -Called and discussed with [facility] , anticipate the patient will need to go to [facility] for bone marrow biopsy and induction chemotherapy absolute Neutrophil count is down to 1.2 from 2.3 on 10/26/2021   -Platelets down to 51 .... LDH up at  253, INR  1.3 , uric acid up  to 9.4.Marland KitchenMarland KitchenMarland Kitchen   3)Elevated D-dimer--- 3.50 especially in the setting of acute leukemia --Suspect this is an acute phase reactant patient was compliant with Eliquis PTA --low clinical index of suspicion for DVT/PE   4) AKI on CKD 3B--care everywhere reveals creatinine was 1.07 with a GFR 56 on 11/26/2019 at [facility] -Creatinine this admission  is 1.32 with a GFR of 45,  -Creatinine down to 1.0  5) chronic atrial fibrillation--- hold Eliquis due to severe anemia and thrombocytopenia with concerns for GI blood loss -Continue Cardizem CD and metoprolol for rate control   6)DM2-previously well controlled -Hold metformin Use Novolog/Humalog Sliding scale insulin with Accu-Cheks/Fingersticks as ordered    7)HLD----continue pravastatin   8)Morbid Obesity- -Low calorie diet, portion control and increase physical activity discussed with patient -Body mass index is 52.94 kg/m.   9)Thrombocytopenia--- platelets 77 >>64>>56 -platelets was 212 on 11/26/2019 at [facility] -No obvious bleeding the patient compliant with his Eliquis  Disposition/Need for in-[facility] Stay- patient unable to be discharged at this time due to --severe symptomatic anemia and thrombocytopenia requiring transfusion of PRBC , patient will probably need transfer to [facility] for bone  marrow biopsy     Dispo: The patient is from: Home              Anticipated d/c is to: [facility]              Anticipated d/c date is: 1 days              Patient currently is not medically stable to  d/c. Barriers: Not Clinically Stable-   Code Status :  -  Code Status: Full Code   Family Communication:     (patient is alert, awake and coherent)  Updated patient's daughter Ms Kaitlyn [facility]  ([phone]) at patient's request  DVT Prophylaxis  :   - SCDs  SCDs Start: 10/26/21 1858 Place TED hose Start: 10/26/21 1858   Lab Results  Component Value Date   PLT 51 (L) 10/29/2021   Inpatient Medications  Scheduled Meds:  diltiazem  120 mg Oral BID   insulin aspart  0-5 Units Subcutaneous QHS   insulin aspart  0-6 Units Subcutaneous TID WC   metoprolol tartrate  50 mg Oral BID   pantoprazole (PROTONIX) IV  40 mg Intravenous Q12H   pravastatin  40 mg Oral QHS   sodium chloride flush  3 mL Intravenous Q12H   sodium chloride flush  3 mL Intravenous Q12H   Continuous Infusions:  sodium chloride     sodium chloride     sodium chloride 20 mL/hr at 10/28/21 1246   lactated ringers 10 mL/hr at 10/28/21 1246   PRN Meds:.sodium chloride, acetaminophen **OR** acetaminophen, bisacodyl, ondansetron **OR** ondansetron (ZOFRAN) IV, sodium chloride flush, traZODone   Anti-infectives (From admission, onward)    None       Subjective: [facility] today has no fevers, no emesis,  No chest pain,   -No acute bleeding concerns -Oral intake is fair  Objective: Vitals:   10/28/21 1045 10/28/21 1220 10/29/21 0951 10/29/21 1230  BP: (!) 90/55 108/67 98/66 109/74  Pulse:  84 100 90  Resp:  18  18  Temp:  97.6 F (36.4 C)  98.1 F (36.7 C)  TempSrc:  Oral  Oral  SpO2:  100%  98%  Weight:      Height:        Intake/Output Summary (Last 24 hours) at 10/29/2021 1830 Last data filed at 10/29/2021 1520 Gross per 24 hour  Intake 596 ml  Output --  Net 596 ml   Filed Weights   10/26/21 0846 10/26/21 2305  Weight: 127 kg 131.3 kg   Physical Exam  Gen:- Awake Alert, morbidly obese, in no acute distress HEENT:- [facility].AT, No sclera icterus, pale sclera Neck-Supple Neck,No JVD,.  Lungs-   CTAB , fair symmetrical air movement CV- S1, S2 normal, irregular  Abd-  +ve B.Sounds, Abd Soft, No tenderness, increased truncal adiposity Extremity/Skin:- No  edema, pedal pulses present , very pale, few areas of bruising/ecchymosis, no petechia or purpura Psych-affect is appropriate, oriented x3 Neuro-generalized weakness, no new focal deficits, no tremors  Data Reviewed: I have personally reviewed following labs and imaging studies  CBC: Recent Labs  Lab 10/26/21 0937 10/26/21 2158 10/27/21 0958 10/27/21 1928 10/28/21 0506 10/29/21 0438  WBC 5.9  --  4.3  --  3.9* 3.6*  NEUTROABS 2.3  --   --   --  1.2* 1.1*  HGB 4.7* 7.4* 6.6* 8.9* 8.0* 7.6*  HCT 16.5* 24.2* 21.6* 28.5* 25.3* 24.2*  MCV 113.0*  --  101.9*  --  95.1 96.0  PLT 77*  --  64*  --  56* 51*   Basic Metabolic Panel: Recent Labs  Lab 10/26/21 0937 10/28/21 0506  NA 141 139  K 5.1 3.9  CL 114* 113*  CO2 19* 20*  GLUCOSE 146* 105*  BUN 25* 17  CREATININE 1.32* 1.09*  CALCIUM 8.5* 8.2*   GFR: Estimated Creatinine Clearance: 68 mL/min (A) (by C-G formula based on SCr of 1.09 mg/dL (H)). Liver Function Tests: Recent Labs  Lab 10/26/21 0937 10/28/21 0506  AST 27 15  ALT 12 8  ALKPHOS 52 49  BILITOT 2.0* 2.6*  PROT 6.6 6.2*  ALBUMIN 3.3* 3.1*   Radiology Studies: No results found.   Scheduled Meds:  diltiazem  120 mg Oral BID   insulin aspart  0-5 Units Subcutaneous QHS   insulin aspart  0-6 Units Subcutaneous TID WC   metoprolol tartrate  50 mg Oral BID   pantoprazole (PROTONIX) IV  40 mg Intravenous Q12H   pravastatin  40 mg Oral QHS   sodium chloride flush  3 mL Intravenous Q12H   sodium chloride flush  3 mL Intravenous Q12H   Continuous Infusions:  sodium chloride     sodium chloride     sodium chloride 20 mL/hr at 10/28/21 1246   lactated ringers 10 mL/hr at 10/28/21 1246     LOS: 2 days    Roxan Hockey M.D on 10/29/2021 at 6:30 PM  Go to [email or web address] - for contact  info  [facility] - Office  [phone]  If 7PM-7AM, please contact night-coverage [email or web address] 10/29/2021, 6:30 PM

## 2021-10-30 DIAGNOSIS — R799 Abnormal finding of blood chemistry, unspecified: Secondary | ICD-10-CM

## 2021-10-30 DIAGNOSIS — D696 Thrombocytopenia, unspecified: Secondary | ICD-10-CM

## 2021-10-30 DIAGNOSIS — D61818 Other pancytopenia: Secondary | ICD-10-CM | POA: Diagnosis present

## 2021-10-30 DIAGNOSIS — D649 Anemia, unspecified: Secondary | ICD-10-CM | POA: Diagnosis not present

## 2021-10-30 DIAGNOSIS — C9200 Acute myeloblastic leukemia, not having achieved remission: Secondary | ICD-10-CM | POA: Diagnosis not present

## 2021-10-30 LAB — CBC WITH DIFFERENTIAL/PLATELET
Abs Immature Granulocytes: 0.05 10*3/uL (ref 0.00–0.07)
Abs Immature Granulocytes: 0 10*3/uL (ref 0.00–0.07)
Basophils Absolute: 0 10*3/uL (ref 0.0–0.1)
Basophils Absolute: 0 10*3/uL (ref 0.0–0.1)
Basophils Relative: 0 %
Basophils Relative: 0 %
Eosinophils Absolute: 0 10*3/uL (ref 0.0–0.5)
Eosinophils Absolute: 0 10*3/uL (ref 0.0–0.5)
Eosinophils Relative: 0 %
Eosinophils Relative: 1 %
HCT: 25.3 % — ABNORMAL LOW (ref 36.0–46.0)
HCT: 29.3 % — ABNORMAL LOW (ref 36.0–46.0)
Hemoglobin: 8 g/dL — ABNORMAL LOW (ref 12.0–15.0)
Hemoglobin: 8.6 g/dL — ABNORMAL LOW (ref 12.0–15.0)
Immature Granulocytes: 1 %
Lymphocytes Relative: 33 %
Lymphocytes Relative: 48 %
Lymphs Abs: 1.3 10*3/uL (ref 0.7–4.0)
Lymphs Abs: 1.3 10*3/uL (ref 0.7–4.0)
MCH: 30.1 pg (ref 26.0–34.0)
MCH: 29.8 pg (ref 26.0–34.0)
MCHC: 31.6 g/dL (ref 30.0–36.0)
MCHC: 29.4 g/dL — ABNORMAL LOW (ref 30.0–36.0)
MCV: 95.1 fL (ref 80.0–100.0)
MCV: 101.4 fL — ABNORMAL HIGH (ref 80.0–100.0)
Monocytes Absolute: 1.4 10*3/uL — ABNORMAL HIGH (ref 0.1–1.0)
Monocytes Absolute: 0.5 10*3/uL (ref 0.1–1.0)
Monocytes Relative: 36 %
Monocytes Relative: 19 %
Neutro Abs: 1.2 10*3/uL — ABNORMAL LOW (ref 1.7–7.7)
Neutro Abs: 0.9 10*3/uL — ABNORMAL LOW (ref 1.7–7.7)
Neutrophils Relative %: 30 %
Neutrophils Relative %: 32 %
Platelets: 56 10*3/uL — ABNORMAL LOW (ref 150–400)
Platelets: 42 10*3/uL — ABNORMAL LOW (ref 150–400)
RBC: 2.66 MIL/uL — ABNORMAL LOW (ref 3.87–5.11)
RBC: 2.89 MIL/uL — ABNORMAL LOW (ref 3.87–5.11)
RDW: 25.3 % — ABNORMAL HIGH (ref 11.5–15.5)
RDW: 23.7 % — ABNORMAL HIGH (ref 11.5–15.5)
WBC: 3.9 10*3/uL — ABNORMAL LOW (ref 4.0–10.5)
WBC: 2.7 10*3/uL — ABNORMAL LOW (ref 4.0–10.5)
nRBC: 1.5 % — ABNORMAL HIGH (ref 0.0–0.2)
nRBC: 0.7 % — ABNORMAL HIGH (ref 0.0–0.2)

## 2021-10-30 LAB — COMPREHENSIVE METABOLIC PANEL
ALT: 12 U/L (ref 0–44)
AST: 19 U/L (ref 15–41)
Albumin: 3.6 g/dL (ref 3.5–5.0)
Alkaline Phosphatase: 56 U/L (ref 38–126)
Anion gap: 8 (ref 5–15)
BUN: 12 mg/dL (ref 8–23)
CO2: 21 mmol/L — ABNORMAL LOW (ref 22–32)
Calcium: 8.5 mg/dL — ABNORMAL LOW (ref 8.9–10.3)
Chloride: 113 mmol/L — ABNORMAL HIGH (ref 98–111)
Creatinine, Ser: 1.15 mg/dL — ABNORMAL HIGH (ref 0.44–1.00)
GFR, Estimated: 53 mL/min — ABNORMAL LOW (ref >60)
Glucose, Bld: 129 mg/dL — ABNORMAL HIGH (ref 70–99)
Potassium: 3.4 mmol/L — ABNORMAL LOW (ref 3.5–5.1)
Sodium: 142 mmol/L (ref 135–145)
Total Bilirubin: 2.2 mg/dL — ABNORMAL HIGH (ref 0.3–1.2)
Total Protein: 7.2 g/dL (ref 6.5–8.1)

## 2021-10-30 LAB — FIBRINOGEN: Fibrinogen: 509 mg/dL — ABNORMAL HIGH (ref 210–475)

## 2021-10-30 LAB — APTT: aPTT: 21 seconds — ABNORMAL LOW (ref 24–36)

## 2021-10-30 LAB — PROTIME-INR
INR: 1.1 (ref 0.8–1.2)
Prothrombin Time: 14.5 seconds (ref 11.4–15.2)

## 2021-10-30 LAB — HEMOGLOBIN A1C
Hgb A1c MFr Bld: 6.2 % — ABNORMAL HIGH (ref 4.8–5.6)
Mean Plasma Glucose: 131 mg/dL

## 2021-10-30 LAB — GLUCOSE, CAPILLARY
Glucose-Capillary: 116 mg/dL — ABNORMAL HIGH (ref 70–99)
Glucose-Capillary: 109 mg/dL — ABNORMAL HIGH (ref 70–99)
Glucose-Capillary: 121 mg/dL — ABNORMAL HIGH (ref 70–99)
Glucose-Capillary: 109 mg/dL — ABNORMAL HIGH (ref 70–99)

## 2021-10-30 LAB — LACTATE DEHYDROGENASE: LDH: 244 U/L — ABNORMAL HIGH (ref 98–192)

## 2021-10-30 LAB — URIC ACID: Uric Acid, Serum: 8 mg/dL — ABNORMAL HIGH (ref 2.5–7.1)

## 2021-10-30 MED ORDER — ALPRAZOLAM 0.5 MG PO TABS: 0.5000 mg | ORAL_TABLET | Freq: Two times a day (BID) | ORAL | 0 refills | Status: DC | PRN

## 2021-10-30 MED ORDER — POTASSIUM CHLORIDE CRYS ER 20 MEQ PO TBCR
40.0000 meq | EXTENDED_RELEASE_TABLET | ORAL | Status: AC
  Administered 2021-10-30 (×2): 40 meq via ORAL
  Filled 2021-10-30 (×2): qty 2

## 2021-10-30 MED ORDER — ALLOPURINOL 300 MG PO TABS: 300.0000 mg | ORAL_TABLET | Freq: Every day | ORAL | 2 refills | Status: DC

## 2021-10-30 NOTE — Progress Notes (Signed)
I met with the patient while inpatient at her bedside. I introduced myself and explained my role in the patient's care. I provided my contact information and encouraged the patient to call with questions or concerns. I discussed upcoming appointments with patient, understanding verbalized.

## 2021-10-30 NOTE — Discharge Summary (Signed)
K[facility], is a 66 y.o. female  DOB 1955/05/14  MRN [id number].  Admission date:  10/26/2021  Admitting Physician  Roxan Hockey, MD  Discharge Date:  10/30/2021   Primary MD  [name], MD  Recommendations for primary care physician for things to follow:   1)Please Stop Eliquis/Apixaban due to increased bleeding risk 2)Avoid ibuprofen/Advil/Aleve/Motrin/Goody Powders/Naproxen/BC powders/Meloxicam/Diclofenac/Indomethacin and other Nonsteroidal anti-inflammatory medications as these will make you more likely to bleed and can cause stomach ulcers, can also cause Kidney problems.  3)Repeat CBC and CMP blood test with Dr. Delton Coombes on Wednesday, 11/01/2021 -Please follow-up with hematologist/oncologist Dr. Derek Jack, MD -Address: inside [facility] (4th Floor), [place], [place], [place] [address] Phone: [phone]  4) please follow-up with Dr. Florene Glen oncologist at [facility] on Friday, 11/03/2021 for bone marrow biopsy and further management of acute myeloid leukemia  Admission Diagnosis  Symptomatic anemia [D64.9] Acute anemia [D64.9]  Discharge Diagnosis  Symptomatic anemia [D64.9] Acute anemia [D64.9]    Principal Problem:   Acute myeloid leukemia not having achieved remission ([place]) Active Problems:   Acute anemia   Thrombocytopenia ([place])   Acute Myeloid Leukemia/Abnormal blood smear/   Pancytopenia due to AML   Hypertension   Morbid obesity due to excess calories ([place])   Chronic atrial fibrillation ([place])   Diabetes ([place])   Fecal occult blood test positive      Past Medical History:  Diagnosis Date   Atrial fibrillation with RVR ([place]) 06/2013   Breast cancer ([place]) 2007   left   Breast disorder    cancer   Diabetes mellitus    Type II   Dysrhythmia    Hematuria 01/20/2014   Hypertension    Hyperthyroidism 06/2013   Obesity    PMB  (postmenopausal bleeding) 07/24/2012   Had 16.1 mm endometrium on [place] will get endo biopsy   Psoriasis    Urinary frequency 01/20/2014    Past Surgical History:  Procedure Laterality Date   BREAST BIOPSY Left 09/14/2020   Procedure: BREAST BIOPSY;  Surgeon: Aviva Signs, MD;  Location: [facility];  Service: General;  Laterality: Left;   BREAST SURGERY  02/20/06   left side    CESAREAN SECTION  1995   HYSTEROSCOPY WITH D & C N/A 08/13/2012   Procedure: DILATATION AND CURETTAGE /HYSTEROSCOPY;  Surgeon: Florian Buff, MD;  Location: [facility];  Service: Gynecology;  Laterality: N/A;   SECONDARY CLOSURE OF WOUND Left 02/20/2021   Procedure: SIMPLE WOUND CLOSURE;  Surgeon: Aviva Signs, MD;  Location: [facility];  Service: General;  Laterality: Left;     HPI  from the history and physical done on the day of admission:      [facility]  is a 66 y.o. female morbid obesity, permanent A-fib on Eliquis, history of breast cancer, diabetes, hypertension, HLD and CKD 3B presenting with worsening shortness of CKD progression and dyspnea on exertion for almost 3 months- --No obvious bleeding noted, the patient is compliant with his Eliquis -  Patient has been having fatigue, dizziness,  dyspnea with activity for about 3 months, over the last week she has had some dyspnea at rest -No prior colonoscopy or EGD --Patient had a positive Cologuard test in October 2022 but patient denies being informed of this abnormal test, and patient had no follow-up for this abnormal Cologuard test -Hemoglobin 4.7 no recent baseline available, however Care Everywhere reveals hemoglobin was 13.3 and platelets went 212 on 11/26/2019 at [place],  -Has used NSAIDs in the past but not in the last month or so - No hematochezia, no hematemesis, no melena, no bright red blood per rectum -Denies abdominal pain, denies chest pains No fever  Or chills    No Nausea, Vomiting or Diarrhea   -Chest x-ray without acute  findings -Creatinine 1.23 with GFR 45 -LFTs are not elevated     Hospital Course:   Brief Narrative:   66 y.o. female morbid obesity, permanent A-fib on Eliquis, history of breast cancer, diabetes, hypertension, HLD and CKD 3B admitted on 10/26/2021 with acute severe symptomatic anemia     -Assessment and Plan: 1)Severe Symptomatic Anemia -Hemoglobin 4.7 no recent baseline available, however Care Everywhere reveals hemoglobin was 13.3 and platelets was 212 on 11/26/2019 at [place],  -No obvious bleeding noted, the patient is compliant with  Eliquis PTA -Patient has been having fatigue, dizziness,  dyspnea with activity for about 3 months, over the last week she has had some dyspnea at rest -No prior colonoscopy or EGD -Patient had a positive Cologuard test in October 2022 but patient denies being informed of this abnormal test, and patient had no follow-up for this abnormal Cologuard test --Has used NSAIDs in the past but not in the last month or so Hgb on admission was 4.7,  -Hgb trending back down to 8.6 -patient has received 4 units of PRBC transfusion this admission -Discontinue Eliquis -B12 and folate are not low -Serum iron is normal at 109, TIBC 298 iron saturation high at 37 -Ferritin WNL at 193, reticulocyte count percentage elevated at 4.5%,  immature reticulocyte fraction elevated at 32.9 -GI consult appreciated -Status post EGD and colonoscopy after Eliquis washout on 10/28/2021 without acute findings on either EGD or colonoscopy   2)Acute Myeloid Leukemia with severe anemia and severe thrombocytopenia- ------Discussed with [place] (pathologist at [facility]  at [phone])--there is high suspicion for acute Leukemia on the peripheral smear (blast cell), suspicion of acute myeloid leukemia confirmed by flow cytometry -Oncology consult from Dr. Delton Coombes appreciated, -Follow-up with Dr. Delton Coombes on 11/01/2021 for repeat labs and reevaluation and then -Patient will  follow-up on 11/03/2021 at [facility] for bone marrow biopsy and induction chemotherapy --Fibrinogen 500, PT and PTT WNL -Uric acid 9.4>. 8.0 ----start allopurinol 300 mg daily as patient is at risk for tumor lysis syndrome especially with induction of chemotherapy - Flow cytometry (10/26/2021): 42% CD34 positive myeloid blast cells.  Very scant basophilic cytoplasm.  Granules/Auer rods are not identified.  Positive for CD34, CD33, CD117, CD123, CD200, HLA-DR and cytoplasmic MPO positive.  Incidental population of Clonal B cells coexpressing CD5 and CD200 are present consistent with monoclonal B-cell lymphocytosis.    3)Elevated D-dimer--- 3.50  --Suspect this is an acute phase reactant, especially in the setting of acute leukemia patient was compliant with Eliquis PTA --low clinical index of suspicion for DVT/PE   4) AKI on CKD 3B--care everywhere reveals creatinine was 1.07 with a GFR 56 on 11/26/2019 at [place] -Creatinine this  admission  is 1.32 with a GFR of 45,  -Creatinine down to 1.1   5) chronic atrial fibrillation--- discontinue Eliquis due to severe anemia and thrombocytopenia with concerns for GI blood loss -Continue Cardizem CD and metoprolol for rate control   6)DM2-previously well controlled -Restart metformin    7)HLD----continue pravastatin   8)Morbid Obesity- -Low calorie diet, portion control and increase physical activity discussed with patient -Body mass index is 52.94 kg/m.     9)Thrombocytopenia--- platelets 77 >>64>>56>>42 -platelets was 212 on 11/26/2019 at [place] -No obvious bleeding the patient compliant with his Eliquis  10) pancytopenia--- due to acute myeloid leukemia, Hgb as above #1, platelets as above #9 -WBC is down to 2.7 with ANC of 900   Dispo: The patient is from: Home              Anticipated d/c is to: Home   Discharge Condition: -stable   Follow UP--[facility] hematologist Dr. Florene Glen and oncologist  Dr. Delton Coombes as below   Follow-up Information     Brantley Fling, MD. Go on 11/03/2021.   Specialty: Internal Medicine Why: For Bone Marrow Biopsy Contact information: [place] [address] [phone]         Derek Jack, MD. Schedule an appointment as soon as possible for a visit on 11/01/2021.   Specialty: Hematology Why: for repeat CBC and CMP blood test Contact information: [place] [address] [phone]                  Consults obtained -oncology/pathology  Diet and Activity recommendation:  As advised  Discharge Instructions     Discharge Instructions     Call MD for:  difficulty breathing, headache or visual disturbances   Complete by: As directed    Call MD for:  persistant dizziness or light-headedness   Complete by: As directed    Call MD for:  persistant nausea and vomiting   Complete by: As directed    Call MD for:  temperature >100.4   Complete by: As directed    Diet - low sodium heart healthy   Complete by: As directed    Discharge instructions   Complete by: As directed    1)Please Stop Eliquis/Apixaban due to increased bleeding risk 2)Avoid ibuprofen/Advil/Aleve/Motrin/Goody Powders/Naproxen/BC powders/Meloxicam/Diclofenac/Indomethacin and other Nonsteroidal anti-inflammatory medications as these will make you more likely to bleed and can cause stomach ulcers, can also cause Kidney problems.  3)Repeat CBC and CMP blood test with Dr. Delton Coombes on Wednesday, 11/01/2021 -Please follow-up with hematologist/oncologist Dr. Derek Jack, MD -Address: inside [facility] (4th Floor), [place], [place],  [address] Phone: [phone]  4) please follow-up with Dr. Florene Glen oncologist at [facility] on Friday, 11/03/2021 for bone marrow biopsy and further management of acute myeloid leukemia   Increase activity slowly   Complete by: As directed          Discharge  Medications     Allergies as of 10/30/2021   No Known Allergies      Medication List     STOP taking these medications    Eliquis 5 MG Tabs tablet Generic drug: apixaban       TAKE these medications    allopurinol 300 MG tablet Commonly known as: Zyloprim Take 1 tablet (300 mg total) by mouth daily.   ALPRAZolam 0.5 MG tablet Commonly known as: Xanax Take 1 tablet (0.5 mg total) by mouth 2 (two) times  daily as needed for sleep or anxiety.   Cartia XT 120 MG 24 hr capsule Generic drug: diltiazem Take 1 capsule by mouth twice daily   lisinopril 2.5 MG tablet Commonly known as: ZESTRIL Take 2.5 mg by mouth daily.   metFORMIN 500 MG 24 hr tablet Commonly known as: GLUCOPHAGE-XR Take 500-1,000 mg by mouth See admin instructions. Take 500 mg by mouth in the morning and 1000 mg at bedtime   metoprolol tartrate 50 MG tablet Commonly known as: LOPRESSOR Take 1 tablet by mouth twice daily   pravastatin 40 MG tablet Commonly known as: PRAVACHOL Take 40 mg by mouth at bedtime.        Major procedures and Radiology Reports - PLEASE review detailed and final reports for all details, in brief -     DG Chest 2 View  Result Date: 10/26/2021 CLINICAL DATA:  Shortness of breath EXAM: CHEST - 2 VIEW COMPARISON:  10/11/2015 FINDINGS: Transverse diameter of heart is slightly increased. There are no signs of pulmonary edema or focal pulmonary consolidation. There is no pleural effusion or pneumothorax. IMPRESSION: There are no signs of pulmonary edema or focal pulmonary consolidation. Electronically Signed   By: Elmer Picker M.D.   On: 10/26/2021 09:19   Today   Subjective    [facility] today has no new complaints -BM without blood        No fever  Or chills   No Nausea, Vomiting or Diarrhea  Patient has been seen and examined prior to discharge   Objective   Blood pressure 116/67, pulse 93, temperature 98.2 F (36.8 C), resp. rate 18, height 5' 2"  (1.575  m), weight 131.3 kg, last menstrual period 11/17/2014, SpO2 99 %.   Intake/Output Summary (Last 24 hours) at 10/30/2021 1741 Last data filed at 10/30/2021 1500 Gross per 24 hour  Intake 960 ml  Output --  Net 960 ml    Exam Gen:- Awake Alert, no acute distress  HEENT:- [place].AT, No sclera icterus Neck-Supple Neck,No JVD,.  Lungs-  CTAB , good air movement bilaterally CV- S1, S2 normal, regular Abd-  +ve B.Sounds, Abd Soft, No tenderness,    Extremity/Skin:- No  edema,   good pulses Psych-affect is appropriate, oriented x3 Neuro-no new focal deficits, no tremors    Data Review   CBC w Diff:  Lab Results  Component Value Date   WBC 2.7 (L) 10/30/2021   HGB 8.6 (L) 10/30/2021   HCT 29.3 (L) 10/30/2021   PLT 42 (L) 10/30/2021   LYMPHOPCT 48 10/30/2021   BANDSPCT 1 10/26/2021   MONOPCT 19 10/30/2021   EOSPCT 1 10/30/2021   BASOPCT 0 10/30/2021    CMP:  Lab Results  Component Value Date   NA 142 10/30/2021   K 3.4 (L) 10/30/2021   CL 113 (H) 10/30/2021   CO2 21 (L) 10/30/2021   BUN 12 10/30/2021   CREATININE 1.15 (H) 10/30/2021   PROT 7.2 10/30/2021   ALBUMIN 3.6 10/30/2021   BILITOT 2.2 (H) 10/30/2021   ALKPHOS 56 10/30/2021   AST 19 10/30/2021   ALT 12 10/30/2021  .  Total Discharge time is about 33 minutes  Roxan Hockey M.D on 10/30/2021 at 5:41 PM  Go to [email or web address] -  for contact info  [facility] - Office  [phone]

## 2021-10-30 NOTE — Consult Note (Signed)
[facility] Consultation Oncology  Name: Kaitlyn Hull      MRN: [id number]    Location: A316/A316-01  Date: 10/30/2021 Time:12:16 PM   REFERRING PHYSICIAN: Dr. Joesph Fillers  REASON FOR CONSULT: Pancytopenia   DIAGNOSIS: Acute myeloid leukemia  HISTORY OF PRESENT ILLNESS: Mrs. Kaitlyn Hull is a 66 year old white female who is seen in consultation today at the request of Dr. Denton Brick.  She complained of fatigue and dyspnea on exertion for the last 1 month.  She was admitted to the hospital on 10/26/2021 with hemoglobin of 4.7, white count 5.9 and platelet count 77.  She had a total of 4 units of blood transfusion.  A colonoscopy on 10/28/2021 showed nonbleeding internal hemorrhoids and a 6 mm polyp in the splenic flexure.  EGD on the same day showed normal esophagus, EGD on the same day showed normal esophagus, minimally polypoid gastric mucosa, normal duodenal bulb and second part of duodenum.  Abnormal cells on the peripheral blood smear were noted.  A flow cytometry was sent.  This was consistent with acute leukemia.  She was on Eliquis for A-fib which was discontinued during hospitalization.  She had a history of stage I breast cancer treated 10 years ago with lumpectomy followed by radiation.  She did not receive any adjuvant chemotherapy.  She lives at home with her boyfriend.  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  Quit smoking 28 years ago.  Family history significant for breast cancer in her sister.  PAST MEDICAL HISTORY:   Past Medical History:  Diagnosis Date   Atrial fibrillation with RVR ([place]) 06/2013   Breast cancer ([place]) 2007   left   Breast disorder    cancer   Diabetes mellitus    Type II   Dysrhythmia    Hematuria 01/20/2014   Hypertension    Hyperthyroidism 06/2013   Obesity    PMB (postmenopausal bleeding) 07/24/2012   Had 16.1 mm endometrium on [place] will get endo biopsy   Psoriasis    Urinary frequency 01/20/2014    ALLERGIES: No Known  Allergies    MEDICATIONS: I have reviewed the patient's current medications.     PAST SURGICAL HISTORY Past Surgical History:  Procedure Laterality Date   BREAST BIOPSY Left 09/14/2020   Procedure: BREAST BIOPSY;  Surgeon: Aviva Signs, MD;  Location: [facility];  Service: General;  Laterality: Left;   BREAST SURGERY  02/20/06   left side    CESAREAN SECTION  1995   HYSTEROSCOPY WITH D & C N/A 08/13/2012   Procedure: DILATATION AND CURETTAGE /HYSTEROSCOPY;  Surgeon: Florian Buff, MD;  Location: [facility];  Service: Gynecology;  Laterality: N/A;   SECONDARY CLOSURE OF WOUND Left 02/20/2021   Procedure: SIMPLE WOUND CLOSURE;  Surgeon: Aviva Signs, MD;  Location: [facility];  Service: General;  Laterality: Left;    FAMILY HISTORY: Family History  Problem Relation Age of Onset   Heart failure Father    CAD Father    Diabetes Sister    Other Sister        blood clots   Breast cancer Sister    Alzheimer's disease Mother    Diabetes Sister    Hypertension Maternal Grandmother    Colon cancer Neg Hx     SOCIAL HISTORY:  reports that she quit smoking about 18 years ago. Her smoking use included cigarettes. She started smoking about 45 years ago. She has a 0.01 pack-year smoking history. She has never used smokeless  tobacco. She reports that she does not drink alcohol and does not use drugs.  PERFORMANCE STATUS: The patient's performance status is 1 - Symptomatic but completely ambulatory  PHYSICAL EXAM: Most Recent Vital Signs: Blood pressure 116/67, pulse 93, temperature 98.2 F (36.8 C), resp. rate 18, height 5' 2"  (1.575 m), weight 289 lb 7.4 oz (131.3 kg), last menstrual period 11/17/2014, SpO2 99 %. BP 116/67   Pulse 93   Temp 98.2 F (36.8 C)   Resp 18   Ht 5' 2"  (1.575 m)   Wt 289 lb 7.4 oz (131.3 kg)   LMP 11/17/2014   SpO2 99%   BMI 52.94 kg/m  General appearance: alert, cooperative, and appears stated age Lungs: clear to auscultation bilaterally Abdomen:  Soft nontender  with no masses. Extremities:  No edema or cyanosis.  Ecchymosis on the upper extremities. Neurologic: Grossly normal  LABORATORY DATA:  Results for orders placed or performed during the hospital encounter of 10/26/21 (from the past 48 hour(s))  Glucose, capillary     Status: Abnormal   Collection Time: 10/28/21  4:32 PM  Result Value Ref Range   Glucose-Capillary 125 (H) 70 - 99 mg/dL    Comment: Glucose reference range applies only to samples taken after fasting for at least 8 hours.  Glucose, capillary     Status: Abnormal   Collection Time: 10/28/21  9:49 PM  Result Value Ref Range   Glucose-Capillary 140 (H) 70 - 99 mg/dL    Comment: Glucose reference range applies only to samples taken after fasting for at least 8 hours.  CBC with Differential/Platelet     Status: Abnormal   Collection Time: 10/29/21  4:38 AM  Result Value Ref Range   WBC 3.6 (L) 4.0 - 10.5 K/uL   RBC 2.52 (L) 3.87 - 5.11 MIL/uL   Hemoglobin 7.6 (L) 12.0 - 15.0 g/dL   HCT 24.2 (L) 36.0 - 46.0 %   MCV 96.0 80.0 - 100.0 fL   MCH 30.2 26.0 - 34.0 pg   MCHC 31.4 30.0 - 36.0 g/dL   RDW 24.0 (H) 11.5 - 15.5 %   Platelets 51 (L) 150 - 400 K/uL    Comment: SPECIMEN CHECKED FOR CLOTS Immature Platelet Fraction may be clinically indicated, consider ordering this additional test [id number] CONSISTENT WITH PREVIOUS RESULT    nRBC 0.8 (H) 0.0 - 0.2 %   Neutrophils Relative % 30 %   Neutro Abs 1.1 (L) 1.7 - 7.7 K/uL   Lymphocytes Relative 31 %   Lymphs Abs 1.1 0.7 - 4.0 K/uL   Monocytes Relative 38 %   Monocytes Absolute 1.4 (H) 0.1 - 1.0 K/uL   Eosinophils Relative 0 %   Eosinophils Absolute 0.0 0.0 - 0.5 K/uL   Basophils Relative 0 %   Basophils Absolute 0.0 0.0 - 0.1 K/uL   Smear Review PLATELET COUNT CONFIRMED BY SMEAR    Immature Granulocytes 1 %   Abs Immature Granulocytes 0.05 0.00 - 0.07 K/uL   Abnormal Lymphocytes Present PRESENT    Acanthocytes PRESENT    Tear Drop Cells PRESENT    Burr Cells  PRESENT     Comment: Performed at [facility], [address], [place], [place] [address]  Glucose, capillary     Status: Abnormal   Collection Time: 10/29/21  7:39 AM  Result Value Ref Range   Glucose-Capillary 114 (H) 70 - 99 mg/dL    Comment: Glucose reference range applies only to samples taken after fasting for at least  8 hours.  Glucose, capillary     Status: Abnormal   Collection Time: 10/29/21 11:11 AM  Result Value Ref Range   Glucose-Capillary 143 (H) 70 - 99 mg/dL    Comment: Glucose reference range applies only to samples taken after fasting for at least 8 hours.  Glucose, capillary     Status: Abnormal   Collection Time: 10/29/21  4:36 PM  Result Value Ref Range   Glucose-Capillary 117 (H) 70 - 99 mg/dL    Comment: Glucose reference range applies only to samples taken after fasting for at least 8 hours.  Glucose, capillary     Status: Abnormal   Collection Time: 10/29/21  9:35 PM  Result Value Ref Range   Glucose-Capillary 151 (H) 70 - 99 mg/dL    Comment: Glucose reference range applies only to samples taken after fasting for at least 8 hours.  Glucose, capillary     Status: Abnormal   Collection Time: 10/30/21  7:50 AM  Result Value Ref Range   Glucose-Capillary 109 (H) 70 - 99 mg/dL    Comment: Glucose reference range applies only to samples taken after fasting for at least 8 hours.  Lactate dehydrogenase     Status: Abnormal   Collection Time: 10/30/21  9:22 AM  Result Value Ref Range   LDH 244 (H) 98 - 192 U/L    Comment: Performed at [facility], [address], [place], [place] [address]  Uric acid     Status: Abnormal   Collection Time: 10/30/21  9:22 AM  Result Value Ref Range   Uric Acid, Serum 8.0 (H) 2.5 - 7.1 mg/dL    Comment: Performed at [facility], [address], [place], [place] [address]  Fibrinogen     Status: Abnormal   Collection Time: 10/30/21  9:22 AM  Result Value Ref Range   Fibrinogen 509 (H) 210 - 475 mg/dL    Comment:  (NOTE) Fibrinogen results may be underestimated in patients receiving thrombolytic therapy. Performed at [facility], [address] [place], [place] [address]   Comprehensive metabolic panel     Status: Abnormal   Collection Time: 10/30/21  9:22 AM  Result Value Ref Range   Sodium 142 135 - 145 mmol/L   Potassium 3.4 (L) 3.5 - 5.1 mmol/L   Chloride 113 (H) 98 - 111 mmol/L   CO2 21 (L) 22 - 32 mmol/L   Glucose, Bld 129 (H) 70 - 99 mg/dL    Comment: Glucose reference range applies only to samples taken after fasting for at least 8 hours.   BUN 12 8 - 23 mg/dL   Creatinine, Ser 1.15 (H) 0.44 - 1.00 mg/dL   Calcium 8.5 (L) 8.9 - 10.3 mg/dL   Total Protein 7.2 6.5 - 8.1 g/dL   Albumin 3.6 3.5 - 5.0 g/dL   AST 19 15 - 41 U/L   ALT 12 0 - 44 U/L   Alkaline Phosphatase 56 38 - 126 U/L   Total Bilirubin 2.2 (H) 0.3 - 1.2 mg/dL   GFR, Estimated 53 (L) >60 mL/min    Comment: (NOTE) Calculated using the CKD-EPI Creatinine Equation (2021)    Anion gap 8 5 - 15    Comment: Performed at [facility], [address], [place], [place] [address]  CBC with Differential/Platelet     Status: Abnormal   Collection Time: 10/30/21  9:22 AM  Result Value Ref Range   WBC 2.7 (L) 4.0 - 10.5 K/uL   RBC 2.89 (L)  3.87 - 5.11 MIL/uL   Hemoglobin 8.6 (L) 12.0 - 15.0 g/dL   HCT 29.3 (L) 36.0 - 46.0 %   MCV 101.4 (H) 80.0 - 100.0 fL   MCH 29.8 26.0 - 34.0 pg   MCHC 29.4 (L) 30.0 - 36.0 g/dL   RDW 23.7 (H) 11.5 - 15.5 %   Platelets 42 (L) 150 - 400 K/uL    Comment: Immature Platelet Fraction may be clinically indicated, consider ordering this additional test [id number]    nRBC 0.7 (H) 0.0 - 0.2 %   Neutrophils Relative % 32 %   Neutro Abs 0.9 (L) 1.7 - 7.7 K/uL   Lymphocytes Relative 48 %   Lymphs Abs 1.3 0.7 - 4.0 K/uL   Monocytes Relative 19 %   Monocytes Absolute 0.5 0.1 - 1.0 K/uL   Eosinophils Relative 1 %   Eosinophils Absolute 0.0 0.0 - 0.5 K/uL   Basophils Relative 0 %   Basophils  Absolute 0.0 0.0 - 0.1 K/uL   RBC Morphology ANISOCYTOSIS    Smear Review PLATELET COUNT CONFIRMED BY SMEAR    Abs Immature Granulocytes 0.00 0.00 - 0.07 K/uL   Abnormal Lymphocytes Present PRESENT    Schistocytes PRESENT    Tear Drop Cells PRESENT    Ovalocytes PRESENT     Comment: Performed at [facility], [address] [place], [place] [address]  APTT     Status: Abnormal   Collection Time: 10/30/21  9:22 AM  Result Value Ref Range   aPTT 21 (L) 24 - 36 seconds    Comment: Performed at [facility], [address] [place], [place] [address]  Protime-INR     Status: None   Collection Time: 10/30/21  9:22 AM  Result Value Ref Range   Prothrombin Time 14.5 11.4 - 15.2 seconds   INR 1.1 0.8 - 1.2    Comment: (NOTE) INR goal varies based on device and disease states. Performed at [facility], [address] [place], [place] [address]   Glucose, capillary     Status: Abnormal   Collection Time: 10/30/21 11:44 AM  Result Value Ref Range   Glucose-Capillary 121 (H) 70 - 99 mg/dL    Comment: Glucose reference range applies only to samples taken after fasting for at least 8 hours.      RADIOGRAPHY: No results found.       ASSESSMENT:  1.  Acute myeloid leukemia: - Presentation with hemoglobin of 4.7 on 10/26/2021 and platelet count 71.  Status post 4 units PRBC transfusion. - Colonoscopy (10/28/2021): Internal hemorrhoids and a 6 mm polyp removed. - EGD (10/28/2021): Normal esophagus, first and second part of duodenum.  Minimally polypoid gastric mucosa. - Flow cytometry (10/26/2021): 42% CD34 positive myeloid blast cells.  Very scant basophilic cytoplasm.  Granules/Auer rods are not identified.  Positive for CD34, CD33, CD117, CD123, CD200, HLA-DR and cytoplasmic MPO positive.  Incidental population of Clonal B cells coexpressing CD5 and CD200 are present consistent with monoclonal B-cell lymphocytosis. - She had stage I left breast cancer diagnosed 10 years ago, treated with  lumpectomy followed by 6 weeks of radiation.  No chemotherapy.  Took antiestrogen therapy for 5 years.  2.  Social/family history: - She lives at home with her boyfriend.  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  No major chemical exposure.  Quit smoking 28 years ago. - Sister has breast cancer.  PLAN:  1.  Acute myeloid leukemia: - I have discussed the diagnosis with the  patient in detail. - I have talked to Dr. Jerrye Noble at [facility].  She will be seen in his clinic on Friday with a bone marrow biopsy. - Reviewed labs from today which shows hemoglobin 8.6 and platelet count 42.  White count is 2.7 with ANC of 900.  Fibrinogen is 500, with a normal PT and PTT.  Uric acid was 8.0. - Patient is stable to be discharged home. - I will repeat labs in our office on Wednesday to see if she needs any transfusions. - Please add allopurinol 300 mg once daily on her discharge medication.  All questions were answered. The patient knows to call the clinic with any problems, questions or concerns. We can certainly see the patient much sooner if necessary.   Derek Jack

## 2021-10-30 NOTE — Progress Notes (Signed)
   Gastroenterology Progress Note   Referring Provider: Dr. Roxan Hockey Primary Care Physician:  [name], MD Primary Gastroenterologist:  Dr. Gala Romney   Patient ID: Kaitlyn Hull; [id number]; 12/09/1955    Subjective   No overt GI bleeding, abdominal pain, N/V. Tolerating diet.    Objective   Vital signs in last 24 hours Temp:  [98.1 F (36.7 C)-98.2 F (36.8 C)] 98.2 F (36.8 C) (08/28 0430) Pulse Rate:  [84-105] 84 (08/28 0430) Resp:  [18-20] 18 (08/28 0430) BP: (100-130)/(63-93) 100/63 (08/28 0430) SpO2:  [98 %-99 %] 99 % (08/28 0430) Last BM Date : 10/27/21  Physical Exam General:   Alert and oriented, pleasant Abdomen:  Bowel sounds present, soft, obese, non-tender, non-distended.  Neurologic:  Alert and  oriented x4 Psych:  Normal mood and affect.  Intake/Output from previous day: 08/27 0701 - 08/28 0700 In: 1076 [P.O.:1076] Out: -  Intake/Output this shift: No intake/output data recorded.  Lab Results  Recent Labs    10/27/21 0958 10/27/21 1928 10/28/21 0506 10/29/21 0438  WBC 4.3  --  3.9* 3.6*  HGB 6.6* 8.9* 8.0* 7.6*  HCT 21.6* 28.5* 25.3* 24.2*  PLT 64*  --  56* 51*   BMET Recent Labs    10/28/21 0506  NA 139  K 3.9  CL 113*  CO2 20*  GLUCOSE 105*  BUN 17  CREATININE 1.09*  CALCIUM 8.2*   LFT Recent Labs    10/28/21 0506  PROT 6.2*  ALBUMIN 3.1*  AST 15  ALT 8  ALKPHOS 49  BILITOT 2.6*   PT/INR Recent Labs    10/28/21 0506  LABPROT 15.6*  INR 1.3*     Studies/Results DG Chest 2 View  Result Date: 10/26/2021 CLINICAL DATA:  Shortness of breath EXAM: CHEST - 2 VIEW COMPARISON:  10/11/2015 FINDINGS: Transverse diameter of heart is slightly increased. There are no signs of pulmonary edema or focal pulmonary consolidation. There is no pleural effusion or pneumothorax. IMPRESSION: There are no signs of pulmonary edema or focal pulmonary consolidation. Electronically Signed   By: Elmer Picker M.D.   On:  10/26/2021 09:19    Assessment  66 y.o. female with a history of a-fib on Eliquis, history of breast cancer, HTN, psoriasis, diabetes who presented to the ED with progressive shortness of breath and fatigue and found to have profound symptomatic anemia.  EGD/colonoscopy completed over the weekend with normal esophagus, minimally polypoid gastric mucosa, normal duodenum, one 6 mm splenic flexure polyp, non-bleeding internal hemorrhoids.   Anemia: profound with Hgb 4.7 on admission, s/p 4 units PRBCs. There is concern for acute myeloid leukemia and will need bone marrow biopsy. Oncology has been consulted.    Plan / Recommendations  Continue PPI BID Will follow-up on surgical pathology as comes available GI will sign off at this time as stable from our standpoint      LOS: 3 days    10/30/2021, 9:55 AM  Annitta Needs, PhD, ANP-BC [facility] Gastroenterology

## 2021-10-30 NOTE — Discharge Instructions (Signed)
1)Please Stop Eliquis/Apixaban due to increased bleeding risk 2)Avoid ibuprofen/Advil/Aleve/Motrin/Goody Powders/Naproxen/BC powders/Meloxicam/Diclofenac/Indomethacin and other Nonsteroidal anti-inflammatory medications as these will make you more likely to bleed and can cause stomach ulcers, can also cause Kidney problems.  3)Repeat CBC and CMP blood test with Dr. Delton Coombes on Wednesday, 11/01/2021 -Please follow-up with hematologist/oncologist Dr. Derek Jack, MD -Address: inside [facility] (4th Floor), [place], [place],  [address] Phone: [phone]  4) please follow-up with Dr. Florene Glen oncologist at [facility] on Friday, 11/03/2021 for bone marrow biopsy and further management of acute myeloid leukemia

## 2021-10-31 LAB — SURGICAL PATHOLOGY

## 2021-11-01 ENCOUNTER — Telehealth: Payer: Self-pay

## 2021-11-01 ENCOUNTER — Inpatient Hospital Stay: Payer: 59 | Attending: Hematology

## 2021-11-01 DIAGNOSIS — R799 Abnormal finding of blood chemistry, unspecified: Secondary | ICD-10-CM

## 2021-11-01 DIAGNOSIS — C9200 Acute myeloblastic leukemia, not having achieved remission: Secondary | ICD-10-CM | POA: Insufficient documentation

## 2021-11-01 LAB — SAMPLE TO BLOOD BANK

## 2021-11-01 NOTE — Telephone Encounter (Signed)
Patient's hemoglobin 8.4 today patient called and notified that she does not need a blood transfusion today, patient verbalized understanding.

## 2021-11-02 ENCOUNTER — Encounter (HOSPITAL_COMMUNITY): Payer: Self-pay | Admitting: Internal Medicine

## 2021-11-02 LAB — CBC WITH DIFFERENTIAL/PLATELET
Abs Immature Granulocytes: 0.01 10*3/uL (ref 0.00–0.07)
Basophils Absolute: 0 10*3/uL (ref 0.0–0.1)
Basophils Relative: 0 %
Eosinophils Absolute: 0 10*3/uL (ref 0.0–0.5)
Eosinophils Relative: 0 %
HCT: 28.1 % — ABNORMAL LOW (ref 36.0–46.0)
Hemoglobin: 8.4 g/dL — ABNORMAL LOW (ref 12.0–15.0)
Immature Granulocytes: 0 %
Lymphocytes Relative: 41 %
Lymphs Abs: 1 10*3/uL (ref 0.7–4.0)
MCH: 29.6 pg (ref 26.0–34.0)
MCHC: 29.9 g/dL — ABNORMAL LOW (ref 30.0–36.0)
MCV: 98.9 fL (ref 80.0–100.0)
Monocytes Absolute: 0.7 10*3/uL (ref 0.1–1.0)
Monocytes Relative: 27 %
Neutro Abs: 0.8 10*3/uL — ABNORMAL LOW (ref 1.7–7.7)
Neutrophils Relative %: 32 %
Platelets: 45 10*3/uL — ABNORMAL LOW (ref 150–400)
RBC: 2.84 MIL/uL — ABNORMAL LOW (ref 3.87–5.11)
RDW: 22.4 % — ABNORMAL HIGH (ref 11.5–15.5)
WBC: 2.5 10*3/uL — ABNORMAL LOW (ref 4.0–10.5)
nRBC: 0 % (ref 0.0–0.2)

## 2021-11-03 DIAGNOSIS — C9500 Acute leukemia of unspecified cell type not having achieved remission: Secondary | ICD-10-CM | POA: Diagnosis not present

## 2021-11-03 DIAGNOSIS — C9200 Acute myeloblastic leukemia, not having achieved remission: Secondary | ICD-10-CM | POA: Diagnosis not present

## 2021-11-03 DIAGNOSIS — D61818 Other pancytopenia: Secondary | ICD-10-CM | POA: Diagnosis not present

## 2021-11-07 ENCOUNTER — Encounter: Payer: Self-pay | Admitting: Internal Medicine

## 2021-11-14 DIAGNOSIS — I82612 Acute embolism and thrombosis of superficial veins of left upper extremity: Secondary | ICD-10-CM | POA: Diagnosis not present

## 2021-11-14 DIAGNOSIS — Z853 Personal history of malignant neoplasm of breast: Secondary | ICD-10-CM | POA: Diagnosis not present

## 2021-11-14 DIAGNOSIS — D696 Thrombocytopenia, unspecified: Secondary | ICD-10-CM | POA: Diagnosis not present

## 2021-11-14 DIAGNOSIS — C9201 Acute myeloblastic leukemia, in remission: Secondary | ICD-10-CM | POA: Diagnosis not present

## 2021-11-14 DIAGNOSIS — Z803 Family history of malignant neoplasm of breast: Secondary | ICD-10-CM | POA: Diagnosis not present

## 2021-11-14 DIAGNOSIS — C9200 Acute myeloblastic leukemia, not having achieved remission: Secondary | ICD-10-CM | POA: Diagnosis not present

## 2021-11-14 DIAGNOSIS — Z8679 Personal history of other diseases of the circulatory system: Secondary | ICD-10-CM | POA: Diagnosis not present

## 2021-11-14 DIAGNOSIS — Z720 Tobacco use: Secondary | ICD-10-CM | POA: Diagnosis not present

## 2021-11-14 DIAGNOSIS — I252 Old myocardial infarction: Secondary | ICD-10-CM | POA: Diagnosis not present

## 2021-11-14 DIAGNOSIS — E785 Hyperlipidemia, unspecified: Secondary | ICD-10-CM | POA: Diagnosis not present

## 2021-11-14 DIAGNOSIS — I4891 Unspecified atrial fibrillation: Secondary | ICD-10-CM | POA: Diagnosis not present

## 2021-11-14 DIAGNOSIS — Z7984 Long term (current) use of oral hypoglycemic drugs: Secondary | ICD-10-CM | POA: Diagnosis not present

## 2021-11-14 DIAGNOSIS — Z6841 Body Mass Index (BMI) 40.0 and over, adult: Secondary | ICD-10-CM | POA: Diagnosis not present

## 2021-11-14 DIAGNOSIS — Z9012 Acquired absence of left breast and nipple: Secondary | ICD-10-CM | POA: Diagnosis not present

## 2021-11-14 DIAGNOSIS — Z5111 Encounter for antineoplastic chemotherapy: Secondary | ICD-10-CM | POA: Diagnosis not present

## 2021-11-14 DIAGNOSIS — L03114 Cellulitis of left upper limb: Secondary | ICD-10-CM | POA: Diagnosis not present

## 2021-11-14 DIAGNOSIS — D759 Disease of blood and blood-forming organs, unspecified: Secondary | ICD-10-CM | POA: Diagnosis not present

## 2021-11-14 DIAGNOSIS — D649 Anemia, unspecified: Secondary | ICD-10-CM | POA: Diagnosis not present

## 2021-11-14 DIAGNOSIS — K529 Noninfective gastroenteritis and colitis, unspecified: Secondary | ICD-10-CM | POA: Diagnosis not present

## 2021-11-14 DIAGNOSIS — C9000 Multiple myeloma not having achieved remission: Secondary | ICD-10-CM | POA: Diagnosis not present

## 2021-11-14 DIAGNOSIS — T451X5A Adverse effect of antineoplastic and immunosuppressive drugs, initial encounter: Secondary | ICD-10-CM | POA: Diagnosis not present

## 2021-11-14 DIAGNOSIS — I4821 Permanent atrial fibrillation: Secondary | ICD-10-CM | POA: Diagnosis not present

## 2021-11-14 DIAGNOSIS — I480 Paroxysmal atrial fibrillation: Secondary | ICD-10-CM | POA: Diagnosis not present

## 2021-11-14 DIAGNOSIS — D61810 Antineoplastic chemotherapy induced pancytopenia: Secondary | ICD-10-CM | POA: Diagnosis not present

## 2021-11-14 DIAGNOSIS — I10 Essential (primary) hypertension: Secondary | ICD-10-CM | POA: Diagnosis not present

## 2021-11-14 DIAGNOSIS — E119 Type 2 diabetes mellitus without complications: Secondary | ICD-10-CM | POA: Diagnosis not present

## 2021-11-14 DIAGNOSIS — E0590 Thyrotoxicosis, unspecified without thyrotoxic crisis or storm: Secondary | ICD-10-CM | POA: Diagnosis not present

## 2021-11-16 ENCOUNTER — Other Ambulatory Visit: Payer: Self-pay

## 2021-11-16 DIAGNOSIS — R799 Abnormal finding of blood chemistry, unspecified: Secondary | ICD-10-CM

## 2021-11-19 DIAGNOSIS — R197 Diarrhea, unspecified: Secondary | ICD-10-CM | POA: Diagnosis not present

## 2021-11-19 DIAGNOSIS — C9100 Acute lymphoblastic leukemia not having achieved remission: Secondary | ICD-10-CM | POA: Diagnosis not present

## 2021-11-19 DIAGNOSIS — L03114 Cellulitis of left upper limb: Secondary | ICD-10-CM | POA: Diagnosis not present

## 2021-11-19 DIAGNOSIS — I82612 Acute embolism and thrombosis of superficial veins of left upper extremity: Secondary | ICD-10-CM | POA: Diagnosis not present

## 2021-11-19 DIAGNOSIS — D61810 Antineoplastic chemotherapy induced pancytopenia: Secondary | ICD-10-CM | POA: Diagnosis not present

## 2021-11-19 DIAGNOSIS — I4821 Permanent atrial fibrillation: Secondary | ICD-10-CM | POA: Diagnosis not present

## 2021-11-20 ENCOUNTER — Inpatient Hospital Stay: Payer: 59 | Attending: Hematology

## 2021-11-20 ENCOUNTER — Inpatient Hospital Stay: Payer: 59

## 2021-11-20 DIAGNOSIS — R609 Edema, unspecified: Secondary | ICD-10-CM | POA: Insufficient documentation

## 2021-11-20 DIAGNOSIS — C9200 Acute myeloblastic leukemia, not having achieved remission: Secondary | ICD-10-CM | POA: Insufficient documentation

## 2021-11-20 DIAGNOSIS — Z7969 Long term (current) use of other immunomodulators and immunosuppressants: Secondary | ICD-10-CM | POA: Diagnosis not present

## 2021-11-20 DIAGNOSIS — D61810 Antineoplastic chemotherapy induced pancytopenia: Secondary | ICD-10-CM | POA: Diagnosis not present

## 2021-11-20 DIAGNOSIS — Z853 Personal history of malignant neoplasm of breast: Secondary | ICD-10-CM | POA: Insufficient documentation

## 2021-11-20 DIAGNOSIS — Z87891 Personal history of nicotine dependence: Secondary | ICD-10-CM | POA: Diagnosis not present

## 2021-11-20 DIAGNOSIS — R799 Abnormal finding of blood chemistry, unspecified: Secondary | ICD-10-CM

## 2021-11-20 DIAGNOSIS — R197 Diarrhea, unspecified: Secondary | ICD-10-CM | POA: Insufficient documentation

## 2021-11-20 LAB — CBC WITH DIFFERENTIAL/PLATELET
Abs Immature Granulocytes: 0.1 10*3/uL — ABNORMAL HIGH (ref 0.00–0.07)
Basophils Absolute: 0 10*3/uL (ref 0.0–0.1)
Basophils Relative: 0 %
Eosinophils Absolute: 0 10*3/uL (ref 0.0–0.5)
Eosinophils Relative: 0 %
HCT: 26.9 % — ABNORMAL LOW (ref 36.0–46.0)
Hemoglobin: 8.5 g/dL — ABNORMAL LOW (ref 12.0–15.0)
Lymphocytes Relative: 17 %
Lymphs Abs: 0.2 10*3/uL — ABNORMAL LOW (ref 0.7–4.0)
MCH: 29.2 pg (ref 26.0–34.0)
MCHC: 31.6 g/dL (ref 30.0–36.0)
MCV: 92.4 fL (ref 80.0–100.0)
Metamyelocytes Relative: 3 %
Monocytes Absolute: 0.1 10*3/uL (ref 0.1–1.0)
Monocytes Relative: 9 %
Myelocytes: 1 %
Neutro Abs: 0.9 10*3/uL — ABNORMAL LOW (ref 1.7–7.7)
Neutrophils Relative %: 70 %
Platelets: 30 10*3/uL — ABNORMAL LOW (ref 150–400)
RBC: 2.91 MIL/uL — ABNORMAL LOW (ref 3.87–5.11)
RDW: 19.2 % — ABNORMAL HIGH (ref 11.5–15.5)
WBC: 1.3 10*3/uL — CL (ref 4.0–10.5)
nRBC: 0 % (ref 0.0–0.2)

## 2021-11-20 LAB — SAMPLE TO BLOOD BANK

## 2021-11-20 LAB — COMPREHENSIVE METABOLIC PANEL
ALT: 11 U/L (ref 0–44)
AST: 22 U/L (ref 15–41)
Albumin: 3.2 g/dL — ABNORMAL LOW (ref 3.5–5.0)
Alkaline Phosphatase: 59 U/L (ref 38–126)
Anion gap: 5 (ref 5–15)
BUN: 17 mg/dL (ref 8–23)
CO2: 22 mmol/L (ref 22–32)
Calcium: 8.5 mg/dL — ABNORMAL LOW (ref 8.9–10.3)
Chloride: 109 mmol/L (ref 98–111)
Creatinine, Ser: 0.99 mg/dL (ref 0.44–1.00)
GFR, Estimated: >60 mL/min (ref >60)
Glucose, Bld: 129 mg/dL — ABNORMAL HIGH (ref 70–99)
Potassium: 4.2 mmol/L (ref 3.5–5.1)
Sodium: 136 mmol/L (ref 135–145)
Total Bilirubin: 2 mg/dL — ABNORMAL HIGH (ref 0.3–1.2)
Total Protein: 6.4 g/dL — ABNORMAL LOW (ref 6.5–8.1)

## 2021-11-20 LAB — MAGNESIUM: Magnesium: 1.6 mg/dL — ABNORMAL LOW (ref 1.7–2.4)

## 2021-11-20 MED ORDER — HEPARIN SOD (PORK) LOCK FLUSH 100 UNIT/ML IV SOLN
250.0000 [IU] | Freq: Once | INTRAVENOUS | Status: AC
  Administered 2021-11-20: 250 [IU] via INTRAVENOUS

## 2021-11-20 MED ORDER — SODIUM CHLORIDE 0.9% FLUSH
3.0000 mL | Freq: Once | INTRAVENOUS | Status: AC
  Administered 2021-11-20: 3 mL via INTRAVENOUS

## 2021-11-20 NOTE — Progress Notes (Signed)
CRITICAL VALUE ALERT Critical value received:  WBC 1.3 .  Date of notification:  11/20/2021 Time of notification: 09:45 am.  Critical value read back:  Yes.   Nurse who received alert:  B. Quana Chamberlain RN MD notified time and response: I,Thayil PA. Faxed labs to [facility] and spoke to patient pertaining to her blood work. HGB 8.5 and platelets 30. Patient denies any shortness of breath, chest pain, or dizziness. Confirmed patient's appointment to return on Thursday at 11:00 for repeat picc flush and lab draw for [facility]. Patient asymptomatic.

## 2021-11-23 ENCOUNTER — Inpatient Hospital Stay: Payer: 59

## 2021-11-23 ENCOUNTER — Ambulatory Visit: Payer: 59 | Attending: Cardiology | Admitting: Cardiology

## 2021-11-23 ENCOUNTER — Encounter: Payer: Self-pay | Admitting: Cardiology

## 2021-11-23 VITALS — BP 114/80 | HR 107 | Ht 62.0 in | Wt 282.2 lb

## 2021-11-23 DIAGNOSIS — I10 Essential (primary) hypertension: Secondary | ICD-10-CM | POA: Diagnosis not present

## 2021-11-23 DIAGNOSIS — I482 Chronic atrial fibrillation, unspecified: Secondary | ICD-10-CM | POA: Diagnosis not present

## 2021-11-23 DIAGNOSIS — C9200 Acute myeloblastic leukemia, not having achieved remission: Secondary | ICD-10-CM | POA: Diagnosis not present

## 2021-11-23 DIAGNOSIS — R799 Abnormal finding of blood chemistry, unspecified: Secondary | ICD-10-CM

## 2021-11-23 LAB — CBC WITH DIFFERENTIAL/PLATELET
Abs Immature Granulocytes: 0.05 10*3/uL (ref 0.00–0.07)
Basophils Absolute: 0 10*3/uL (ref 0.0–0.1)
Basophils Relative: 0 %
Eosinophils Absolute: 0 10*3/uL (ref 0.0–0.5)
Eosinophils Relative: 0 %
HCT: 24.9 % — ABNORMAL LOW (ref 36.0–46.0)
Hemoglobin: 7.7 g/dL — ABNORMAL LOW (ref 12.0–15.0)
Immature Granulocytes: 10 %
Lymphocytes Relative: 41 %
Lymphs Abs: 0.2 10*3/uL — ABNORMAL LOW (ref 0.7–4.0)
MCH: 28.7 pg (ref 26.0–34.0)
MCHC: 30.9 g/dL (ref 30.0–36.0)
MCV: 92.9 fL (ref 80.0–100.0)
Monocytes Absolute: 0 10*3/uL — ABNORMAL LOW (ref 0.1–1.0)
Monocytes Relative: 8 %
Neutro Abs: 0.2 10*3/uL — CL (ref 1.7–7.7)
Neutrophils Relative %: 41 %
Platelets: 15 10*3/uL — CL (ref 150–400)
RBC: 2.68 MIL/uL — ABNORMAL LOW (ref 3.87–5.11)
RDW: 18.9 % — ABNORMAL HIGH (ref 11.5–15.5)
WBC: 0.5 10*3/uL — CL (ref 4.0–10.5)
nRBC: 0 % (ref 0.0–0.2)

## 2021-11-23 LAB — COMPREHENSIVE METABOLIC PANEL
ALT: 11 U/L (ref 0–44)
AST: 20 U/L (ref 15–41)
Albumin: 3.3 g/dL — ABNORMAL LOW (ref 3.5–5.0)
Alkaline Phosphatase: 54 U/L (ref 38–126)
Anion gap: 7 (ref 5–15)
BUN: 16 mg/dL (ref 8–23)
CO2: 22 mmol/L (ref 22–32)
Calcium: 8.8 mg/dL — ABNORMAL LOW (ref 8.9–10.3)
Chloride: 108 mmol/L (ref 98–111)
Creatinine, Ser: 0.97 mg/dL (ref 0.44–1.00)
GFR, Estimated: >60 mL/min (ref >60)
Glucose, Bld: 122 mg/dL — ABNORMAL HIGH (ref 70–99)
Potassium: 4 mmol/L (ref 3.5–5.1)
Sodium: 137 mmol/L (ref 135–145)
Total Bilirubin: 1.8 mg/dL — ABNORMAL HIGH (ref 0.3–1.2)
Total Protein: 6.5 g/dL (ref 6.5–8.1)

## 2021-11-23 LAB — MAGNESIUM: Magnesium: 1.3 mg/dL — ABNORMAL LOW (ref 1.7–2.4)

## 2021-11-23 LAB — SAMPLE TO BLOOD BANK

## 2021-11-23 MED ORDER — HEPARIN SOD (PORK) LOCK FLUSH 100 UNIT/ML IV SOLN
250.0000 [IU] | Freq: Once | INTRAVENOUS | Status: AC
  Administered 2021-11-23: 250 [IU] via INTRAVENOUS

## 2021-11-23 MED ORDER — SODIUM CHLORIDE 0.9% FLUSH
10.0000 mL | Freq: Once | INTRAVENOUS | Status: AC
  Administered 2021-11-23: 10 mL via INTRAVENOUS

## 2021-11-23 MED ORDER — METOPROLOL TARTRATE 100 MG PO TABS: 100.0000 mg | ORAL_TABLET | Freq: Two times a day (BID) | ORAL | 3 refills | Status: AC

## 2021-11-23 NOTE — Progress Notes (Signed)
Clinical Summary Kaitlyn Hull is a 66 y.o.female seen today for follow up of the following medical problems.      1. Permanent afib   - no palpitations - compliant with meds - no bleeding on eliquis.    - eliquis held during recent admission 10/2021 with symptomatic anemia, Hgb 4.7 - no obvious source of bleeding from workup -during admit at [facility] low bp's, diltiazem held, metoprolol increased to '75mg'$  bid. This was in setting of chronic diarrhea several weeks  Labs today Hgb 7.7, Plt 15 - no recent palpitations.     2. HTN -recent issues with low bp's. Dilt stopped, lisinopril stopped   3. Hyperlipidemia - 11/2019 TC 185 HDL 78 LDL 82 TG 147  - 11/2020 TC 173 TG 132 HDL 73 LDL 77   4. Fatigue - feels drained with activities, some related SOB.  - no recent edema. No chest pains.   07/2021 echo: LVEF 50-55%, no WMAs, indet diastolic fxn, mild RV dysfunction, normal PASP, mild MR  5.Anemia/Pancytopenia - admit 10/2021 with Hgb 4.7, transfused 4 units - being EGD and colonoscopy  6. AML - followed by oncolology   Social history: Kaitlyn Hull passed away in 06-07-2016 from COPD.  Kaitlyn Hull lives in [place] and she has a Hull in [place]. Past Medical History:  Diagnosis Date   Atrial fibrillation with RVR ([place]) 06/2013   Breast cancer ([place]) 2005-06-07   left   Breast disorder    cancer   Diabetes mellitus    Type II   Dysrhythmia    Hematuria 01/20/2014   Hypertension    Hyperthyroidism 06/2013   Obesity    PMB (postmenopausal bleeding) 07/24/2012   Had 16.1 mm endometrium on [place] will get endo biopsy   Psoriasis    Urinary frequency 01/20/2014     No Known Allergies   Current Outpatient Medications  Medication Sig Dispense Refill   allopurinol (ZYLOPRIM) 300 MG tablet Take 1 tablet (300 mg total) by mouth daily. 30 tablet 2   ALPRAZolam (XANAX) 0.5 MG tablet Take 1 tablet (0.5 mg total) by mouth 2 (two) times daily as needed for sleep or  anxiety. 10 tablet 0   CARTIA XT 120 MG 24 hr capsule Take 1 capsule by mouth twice daily 180 capsule 1   lisinopril (ZESTRIL) 2.5 MG tablet Take 2.5 mg by mouth daily.     metFORMIN (GLUCOPHAGE-XR) 500 MG 24 hr tablet Take 500-1,000 mg by mouth See admin instructions. Take 500 mg by mouth in the morning and 1000 mg at bedtime     metoprolol tartrate (LOPRESSOR) 50 MG tablet Take 1 tablet by mouth twice daily (Patient taking differently: Take 50 mg by mouth 2 (two) times daily.) 180 tablet 1   pravastatin (PRAVACHOL) 40 MG tablet Take 40 mg by mouth at bedtime.     No current facility-administered medications for this visit.     Past Surgical History:  Procedure Laterality Date   BREAST BIOPSY Left 09/14/2020   Procedure: BREAST BIOPSY;  Surgeon: Aviva Signs, MD;  Location: [facility];  Service: General;  Laterality: Left;   BREAST SURGERY  02/20/06   left side    CESAREAN SECTION  1995   COLONOSCOPY WITH PROPOFOL N/A 10/28/2021   Procedure: COLONOSCOPY WITH PROPOFOL;  Surgeon: Daneil Dolin, MD;  Location: [facility];  Service: Endoscopy;  Laterality: N/A;   ESOPHAGOGASTRODUODENOSCOPY (EGD) WITH PROPOFOL N/A 10/28/2021   Procedure: ESOPHAGOGASTRODUODENOSCOPY (EGD)  WITH PROPOFOL;  Surgeon: Daneil Dolin, MD;  Location: [facility];  Service: Endoscopy;  Laterality: N/A;   HYSTEROSCOPY WITH D & C N/A 08/13/2012   Procedure: DILATATION AND CURETTAGE /HYSTEROSCOPY;  Surgeon: Florian Buff, MD;  Location: [facility];  Service: Gynecology;  Laterality: N/A;   SECONDARY CLOSURE OF WOUND Left 02/20/2021   Procedure: SIMPLE WOUND CLOSURE;  Surgeon: Aviva Signs, MD;  Location: [facility];  Service: General;  Laterality: Left;     No Known Allergies    Family History  Problem Relation Age of Onset   Heart failure Father    CAD Father    Diabetes Hull    Other Hull        blood clots   Breast cancer Hull    Alzheimer's disease Mother    Diabetes Hull    Hypertension Maternal  Grandmother    Colon cancer Neg Hx      Social History Kaitlyn Hull reports that she quit smoking about 18 Hull ago. Kaitlyn smoking use included cigarettes. She started smoking about 45 Hull ago. She has a 0.01 pack-year smoking history. She has never used smokeless tobacco. Kaitlyn Hull reports no history of alcohol use.   Review of Systems CONSTITUTIONAL: +fatigue HEENT: Eyes: No visual loss, blurred vision, double vision or yellow sclerae.No hearing loss, sneezing, congestion, runny nose or sore throat.  SKIN: No rash or itching.  CARDIOVASCULAR: per hpi RESPIRATORY: No shortness of breath, cough or sputum.  GASTROINTESTINAL: No anorexia, nausea, vomiting or diarrhea. No abdominal pain or blood.  GENITOURINARY: No burning on urination, no polyuria NEUROLOGICAL: No headache, dizziness, syncope, paralysis, ataxia, numbness or tingling in the extremities. No change in bowel or bladder control.  MUSCULOSKELETAL: No muscle, back pain, joint pain or stiffness.  LYMPHATICS: No enlarged nodes. No history of splenectomy.  PSYCHIATRIC: No history of depression or anxiety.  ENDOCRINOLOGIC: No reports of sweating, cold or heat intolerance. No polyuria or polydipsia.  Marland Kitchen   Physical Examination Today's Vitals   11/23/21 1545  BP: 114/80  Pulse: (!) 107  SpO2: 98%  Weight: 282 lb 3.2 oz (128 kg)  Height: '5\' 2"'$  (1.575 m)   Body mass index is 51.62 kg/m.  Gen: resting comfortably, no acute distress HEENT: no scleral icterus, pupils equal round and reactive, no palptable cervical adenopathy,  CV: irreg, tach Resp: Clear to auscultation bilaterally GI: abdomen is soft, non-tender, non-distended, normal bowel sounds, no hepatosplenomegaly MSK: extremities are warm, 1+ bilateral LE edema Skin: warm, no rash Neuro:  no focal deficits Psych: appropriate affect   Diagnostic Studies  07/2021 echo IMPRESSIONS     1. Left ventricular ejection fraction, by estimation, is 50 to 55%. The  left  ventricle has low normal function. The left ventricle has no regional  wall motion abnormalities. Left ventricular diastolic parameters are  indeterminate.   2. Right ventricular systolic function is mildly reduced. The right  ventricular size is mildly enlarged. There is normal pulmonary artery  systolic pressure. The estimated right ventricular systolic pressure is  84.1 mmHg.   3. The mitral valve is grossly normal. Mild mitral valve regurgitation.   4. Tricuspid valve regurgitation is moderate.   5. The aortic valve is tricuspid. Aortic valve regurgitation is not  visualized.   6. The inferior vena cava is normal in size with greater than 50%  respiratory variability, suggesting right atrial pressure of 3 mmHg.    Assessment and Plan   1. Afib/acquired thrombophilia -permament afib. Off  dilt during recent admission with low bp's, currently on lopressor '75mg'$  bid - high rates today, increase lopressor to '100mg'$  bid. EKG next week to recheck rates. If neccesary could add midodrine to support bp's for av nodal agents.  - off anticoag due to anemia, thrombocytopenia related to Kaitlyn AML and ongoing chemo.    2. HTN -at goal today. Recent low bp's during admission, diarrhea at the time perhaps contributing - off dilt and lisinopril, monitor at this time.   EKG next week to recheck HRs, if neccesary could add low dose diltiazem. If needed low dose midodrine to support bp   Arnoldo Lenis, M.D.

## 2021-11-23 NOTE — Patient Instructions (Signed)
Medication Instructions:  Your physician has recommended you make the following change in your medication:   -Increase Lopressor to 100 mg tablets twice daily   Labwork: None  Testing/Procedures: None  Follow-Up: Follow up in 4 months.   Any Other Special Instructions Will Be Listed Below (If Applicable).  Please come to our office after your appointment at the [facility] on 11/30/2021 for an EKG.    If you need a refill on your cardiac medications before your next appointment, please call your pharmacy.

## 2021-11-23 NOTE — Patient Instructions (Signed)
Kaitlyn Hull  Discharge Instructions: Thank you for choosing [facility] to provide your oncology and hematology care.  If you have a lab appointment with the [facility], please come in thru the Main Entrance and check in at the main information desk.  Wear comfortable clothing and clothing appropriate for easy access to any Portacath or PICC line.   We strive to give you quality time with your provider. You may need to reschedule your appointment if you arrive late (15 or more minutes).  Arriving late affects you and other patients whose appointments are after yours.  Also, if you miss three or more appointments without notifying the office, you may be dismissed from the clinic at the provider's discretion.      For prescription refill requests, have your pharmacy contact our office and allow 72 hours for refills to be completed.    Today you received the following labs drawn and dressing changed, return as scheduled.   To help prevent nausea and vomiting after your treatment, we encourage you to take your nausea medication as directed.  BELOW ARE SYMPTOMS THAT SHOULD BE REPORTED IMMEDIATELY: *FEVER GREATER THAN 100.4 F (38 C) OR HIGHER *CHILLS OR SWEATING *NAUSEA AND VOMITING THAT IS NOT CONTROLLED WITH YOUR NAUSEA MEDICATION *UNUSUAL SHORTNESS OF BREATH *UNUSUAL BRUISING OR BLEEDING *URINARY PROBLEMS (pain or burning when urinating, or frequent urination) *BOWEL PROBLEMS (unusual diarrhea, constipation, pain near the anus) TENDERNESS IN MOUTH AND THROAT WITH OR WITHOUT PRESENCE OF ULCERS (sore throat, sores in mouth, or a toothache) UNUSUAL RASH, SWELLING OR PAIN  UNUSUAL VAGINAL DISCHARGE OR ITCHING   Items with * indicate a potential emergency and should be followed up as soon as possible or go to the Emergency Department if any problems should occur.  Please show the CHEMOTHERAPY ALERT CARD or IMMUNOTHERAPY ALERT CARD at check-in to the  Emergency Department and triage nurse.  Should you have questions after your visit or need to cancel or reschedule your appointment, please contact [facility] [phone]  and follow the prompts.  Office hours are 8:00 a.m. to 4:30 p.m. Monday - Friday. Please note that voicemails left after 4:00 p.m. may not be returned until the following business day.  We are closed weekends and major holidays. You have access to a nurse at all times for urgent questions. Please call the main number to the clinic [phone] and follow the prompts.  For any non-urgent questions, you may also contact your provider using MyChart. We now offer e-Visits for anyone 75 and older to request care online for non-urgent symptoms. For details visit [email or web address].   Also download the MyChart app! Go to the app store, search "MyChart", open the app, select [facility], and log in with your MyChart username and password.  Masks are optional in the cancer centers. If you would like for your care team to wear a mask while they are taking care of you, please let them know. You may have one support person who is at least 65 years old accompany you for your appointments.

## 2021-11-23 NOTE — Progress Notes (Signed)
Patient presents today for PICC dressing change and labs drawn. PICC flushed with good blood return noted. No bruising or swelling at site. New PICC dressing applied and patient discharged in satisfactory condition. VVS stable with no signs or symptoms of distressed noted.

## 2021-11-23 NOTE — Progress Notes (Signed)
CRITICAL VALUE ALERT Critical value received:  WBC 0.5, platelets 15, ANC 0.2.  Date of notification:  11-23-2021 Time of notification: 12:36 pm.  Critical value read back:  Yes.   Nurse who received alert:  B. Alisha Burgo RN MD notified time and response:  ICharlies Silvers PA @ 12:59 pm. [facility] patient. Standing orders. Patient will receive 1 unit of platelets and 2 units of PRBC's 11-24-2021. Labs faxed to [facility] attention Dr. Florene Glen.

## 2021-11-24 ENCOUNTER — Inpatient Hospital Stay: Payer: 59

## 2021-11-24 ENCOUNTER — Other Ambulatory Visit (HOSPITAL_COMMUNITY)
Admission: RE | Admit: 2021-11-24 | Discharge: 2021-11-24 | Disposition: A | Discharge status: Home / Self Care | Payer: 59 | Source: Other Acute Inpatient Hospital | Attending: Internal Medicine | Admitting: Internal Medicine

## 2021-11-24 ENCOUNTER — Other Ambulatory Visit: Payer: Self-pay | Admitting: Physician Assistant

## 2021-11-24 VITALS — BP 126/86 | HR 66 | Temp 96.3°F | Resp 18

## 2021-11-24 DIAGNOSIS — R799 Abnormal finding of blood chemistry, unspecified: Secondary | ICD-10-CM

## 2021-11-24 DIAGNOSIS — R197 Diarrhea, unspecified: Secondary | ICD-10-CM | POA: Insufficient documentation

## 2021-11-24 DIAGNOSIS — C9200 Acute myeloblastic leukemia, not having achieved remission: Secondary | ICD-10-CM | POA: Diagnosis not present

## 2021-11-24 LAB — PREPARE RBC (CROSSMATCH)

## 2021-11-24 MED ORDER — SODIUM CHLORIDE 0.9 % IV SOLN: INTRAVENOUS | Status: DC

## 2021-11-24 MED ORDER — SODIUM CHLORIDE 0.9% IV SOLUTION
250.0000 mL | Freq: Once | INTRAVENOUS | Status: AC
  Administered 2021-11-24: 250 mL via INTRAVENOUS

## 2021-11-24 MED ORDER — SODIUM CHLORIDE 0.9% FLUSH
10.0000 mL | INTRAVENOUS | Status: AC | PRN
  Administered 2021-11-24: 10 mL

## 2021-11-24 MED ORDER — DIPHENHYDRAMINE HCL 25 MG PO CAPS
25.0000 mg | ORAL_CAPSULE | Freq: Once | ORAL | Status: AC
  Administered 2021-11-24: 25 mg via ORAL

## 2021-11-24 MED ORDER — HEPARIN SOD (PORK) LOCK FLUSH 100 UNIT/ML IV SOLN
500.0000 [IU] | Freq: Every day | INTRAVENOUS | Status: AC | PRN
  Administered 2021-11-24: 500 [IU]

## 2021-11-24 MED ORDER — MAGNESIUM SULFATE 2 GM/50ML IV SOLN
2.0000 g | Freq: Once | INTRAVENOUS | Status: AC
  Administered 2021-11-24: 2 g via INTRAVENOUS
  Filled 2021-11-24: qty 50

## 2021-11-24 MED ORDER — ACETAMINOPHEN 325 MG PO TABS
650.0000 mg | ORAL_TABLET | Freq: Once | ORAL | Status: AC
  Administered 2021-11-24: 650 mg via ORAL

## 2021-11-24 NOTE — Patient Instructions (Signed)
[facility]  Discharge Instructions: Thank you for choosing [facility] to provide your oncology and hematology care.  If you have a lab appointment with the [facility], please come in thru the Main Entrance and check in at the main information desk.  Wear comfortable clothing and clothing appropriate for easy access to any Portacath or PICC line.   We strive to give you quality time with your provider. You may need to reschedule your appointment if you arrive late (15 or more minutes).  Arriving late affects you and other patients whose appointments are after yours.  Also, if you miss three or more appointments without notifying the office, you may be dismissed from the clinic at the provider's discretion.      For prescription refill requests, have your pharmacy contact our office and allow 72 hours for refills to be completed.    You got 2 units of blood today, one unit of platelets, 4 grams of magnesium.    To help prevent nausea and vomiting after your treatment, we encourage you to take your nausea medication as directed.  BELOW ARE SYMPTOMS THAT SHOULD BE REPORTED IMMEDIATELY: *FEVER GREATER THAN 100.4 F (38 C) OR HIGHER *CHILLS OR SWEATING *NAUSEA AND VOMITING THAT IS NOT CONTROLLED WITH YOUR NAUSEA MEDICATION *UNUSUAL SHORTNESS OF BREATH *UNUSUAL BRUISING OR BLEEDING *URINARY PROBLEMS (pain or burning when urinating, or frequent urination) *BOWEL PROBLEMS (unusual diarrhea, constipation, pain near the anus) TENDERNESS IN MOUTH AND THROAT WITH OR WITHOUT PRESENCE OF ULCERS (sore throat, sores in mouth, or a toothache) UNUSUAL RASH, SWELLING OR PAIN  UNUSUAL VAGINAL DISCHARGE OR ITCHING   Items with * indicate a potential emergency and should be followed up as soon as possible or go to the Emergency Department if any problems should occur.  Please show the CHEMOTHERAPY ALERT CARD or IMMUNOTHERAPY ALERT CARD at check-in to the Emergency  Department and triage nurse.  Should you have questions after your visit or need to cancel or reschedule your appointment, please contact [facility] [phone]  and follow the prompts.  Office hours are 8:00 a.m. to 4:30 p.m. Monday - Friday. Please note that voicemails left after 4:00 p.m. may not be returned until the following business day.  We are closed weekends and major holidays. You have access to a nurse at all times for urgent questions. Please call the main number to the clinic [phone] and follow the prompts.  For any non-urgent questions, you may also contact your provider using MyChart. We now offer e-Visits for anyone 57 and older to request care online for non-urgent symptoms. For details visit [email or web address].   Also download the MyChart app! Go to the app store, search "MyChart", open the app, select [facility], and log in with your MyChart username and password.  Masks are optional in the cancer centers. If you would like for your care team to wear a mask while they are taking care of you, please let them know. You may have one support person who is at least 65 years old accompany you for your appointments.

## 2021-11-24 NOTE — Progress Notes (Unsigned)
Patient presented for blood transfusion today. [facility] RN contacted clinic today to inform our provider that we need to give her some magnesium. Current magnesium is 1.3. Tarri Abernethy PA-C notified and ordered 4 grams of magnesium today .    2 units of blood, one unit of platelets, 4 grams of mag given per orders. Patient tolerated it well without problems. Vitals stable and discharged home from clinic ambulatory. Follow up as scheduled.

## 2021-11-25 LAB — PREPARE PLATELET PHERESIS: Unit division: 0

## 2021-11-25 LAB — BPAM RBC
Blood Product Expiration Date: 202310052359
Blood Product Expiration Date: 202310062359
ISSUE DATE / TIME: 202309221001
ISSUE DATE / TIME: 202309221209
Unit Type and Rh: 6200
Unit Type and Rh: 6200

## 2021-11-25 LAB — TYPE AND SCREEN
ABO/RH(D): A POS
Antibody Screen: NEGATIVE
Unit division: 0
Unit division: 0

## 2021-11-25 LAB — BPAM PLATELET PHERESIS
Blood Product Expiration Date: 202309242359
ISSUE DATE / TIME: 202309221347
Unit Type and Rh: 6200

## 2021-11-27 ENCOUNTER — Inpatient Hospital Stay: Payer: 59

## 2021-11-27 ENCOUNTER — Other Ambulatory Visit: Payer: Self-pay | Admitting: *Deleted

## 2021-11-27 VITALS — BP 123/68 | HR 102 | Temp 96.8°F | Resp 20

## 2021-11-27 DIAGNOSIS — R197 Diarrhea, unspecified: Secondary | ICD-10-CM

## 2021-11-27 DIAGNOSIS — C9200 Acute myeloblastic leukemia, not having achieved remission: Secondary | ICD-10-CM | POA: Diagnosis not present

## 2021-11-27 DIAGNOSIS — D61818 Other pancytopenia: Secondary | ICD-10-CM

## 2021-11-27 DIAGNOSIS — R799 Abnormal finding of blood chemistry, unspecified: Secondary | ICD-10-CM

## 2021-11-27 LAB — CBC WITH DIFFERENTIAL/PLATELET
Abs Immature Granulocytes: 0.01 10*3/uL (ref 0.00–0.07)
Basophils Absolute: 0 10*3/uL (ref 0.0–0.1)
Basophils Relative: 0 %
Eosinophils Absolute: 0 10*3/uL (ref 0.0–0.5)
Eosinophils Relative: 0 %
HCT: 29.3 % — ABNORMAL LOW (ref 36.0–46.0)
Hemoglobin: 9.5 g/dL — ABNORMAL LOW (ref 12.0–15.0)
Immature Granulocytes: 4 %
Lymphocytes Relative: 78 %
Lymphs Abs: 0.2 10*3/uL — ABNORMAL LOW (ref 0.7–4.0)
MCH: 29.4 pg (ref 26.0–34.0)
MCHC: 32.4 g/dL (ref 30.0–36.0)
MCV: 90.7 fL (ref 80.0–100.0)
Monocytes Absolute: 0 10*3/uL — ABNORMAL LOW (ref 0.1–1.0)
Monocytes Relative: 7 %
Neutro Abs: 0 10*3/uL — CL (ref 1.7–7.7)
Neutrophils Relative %: 11 %
Platelets: 9 10*3/uL — CL (ref 150–400)
RBC: 3.23 MIL/uL — ABNORMAL LOW (ref 3.87–5.11)
RDW: 16.7 % — ABNORMAL HIGH (ref 11.5–15.5)
WBC: 0.3 10*3/uL — CL (ref 4.0–10.5)
nRBC: 0 % (ref 0.0–0.2)

## 2021-11-27 LAB — MAGNESIUM: Magnesium: 1.6 mg/dL — ABNORMAL LOW (ref 1.7–2.4)

## 2021-11-27 LAB — COMPREHENSIVE METABOLIC PANEL
ALT: 14 U/L (ref 0–44)
AST: 22 U/L (ref 15–41)
Albumin: 3.3 g/dL — ABNORMAL LOW (ref 3.5–5.0)
Alkaline Phosphatase: 64 U/L (ref 38–126)
Anion gap: 8 (ref 5–15)
BUN: 16 mg/dL (ref 8–23)
CO2: 23 mmol/L (ref 22–32)
Calcium: 9 mg/dL (ref 8.9–10.3)
Chloride: 108 mmol/L (ref 98–111)
Creatinine, Ser: 1.06 mg/dL — ABNORMAL HIGH (ref 0.44–1.00)
GFR, Estimated: 59 mL/min — ABNORMAL LOW (ref >60)
Glucose, Bld: 116 mg/dL — ABNORMAL HIGH (ref 70–99)
Potassium: 4.1 mmol/L (ref 3.5–5.1)
Sodium: 139 mmol/L (ref 135–145)
Total Bilirubin: 2.1 mg/dL — ABNORMAL HIGH (ref 0.3–1.2)
Total Protein: 6.3 g/dL — ABNORMAL LOW (ref 6.5–8.1)

## 2021-11-27 LAB — SAMPLE TO BLOOD BANK

## 2021-11-27 MED ORDER — HEPARIN SOD (PORK) LOCK FLUSH 100 UNIT/ML IV SOLN
500.0000 [IU] | Freq: Once | INTRAVENOUS | Status: AC
  Administered 2021-11-27: 250 [IU] via INTRAVENOUS

## 2021-11-27 MED ORDER — SODIUM CHLORIDE 0.9% FLUSH: 10.0000 mL | INTRAVENOUS | Status: DC | PRN

## 2021-11-27 NOTE — Patient Instructions (Signed)
[facility]  Discharge Instructions: Thank you for choosing [facility] to provide your oncology and hematology care.  If you have a lab appointment with the [facility], please come in thru the Main Entrance and check in at the main information desk.  Wear comfortable clothing and clothing appropriate for easy access to any Portacath or PICC line.   We strive to give you quality time with your provider. You may need to reschedule your appointment if you arrive late (15 or more minutes).  Arriving late affects you and other patients whose appointments are after yours.  Also, if you miss three or more appointments without notifying the office, you may be dismissed from the clinic at the provider's discretion.      For prescription refill requests, have your pharmacy contact our office and allow 72 hours for refills to be completed.    Today you received the following chemotherapy and/or immunotherapy agents PICC flush with labs      To help prevent nausea and vomiting after your treatment, we encourage you to take your nausea medication as directed.  BELOW ARE SYMPTOMS THAT SHOULD BE REPORTED IMMEDIATELY: *FEVER GREATER THAN 100.4 F (38 C) OR HIGHER *CHILLS OR SWEATING *NAUSEA AND VOMITING THAT IS NOT CONTROLLED WITH YOUR NAUSEA MEDICATION *UNUSUAL SHORTNESS OF BREATH *UNUSUAL BRUISING OR BLEEDING *URINARY PROBLEMS (pain or burning when urinating, or frequent urination) *BOWEL PROBLEMS (unusual diarrhea, constipation, pain near the anus) TENDERNESS IN MOUTH AND THROAT WITH OR WITHOUT PRESENCE OF ULCERS (sore throat, sores in mouth, or a toothache) UNUSUAL RASH, SWELLING OR PAIN  UNUSUAL VAGINAL DISCHARGE OR ITCHING   Items with * indicate a potential emergency and should be followed up as soon as possible or go to the Emergency Department if any problems should occur.  Please show the CHEMOTHERAPY ALERT CARD or IMMUNOTHERAPY ALERT CARD at check-in to  the Emergency Department and triage nurse.  Should you have questions after your visit or need to cancel or reschedule your appointment, please contact [facility] [phone]  and follow the prompts.  Office hours are 8:00 a.m. to 4:30 p.m. Monday - Friday. Please note that voicemails left after 4:00 p.m. may not be returned until the following business day.  We are closed weekends and major holidays. You have access to a nurse at all times for urgent questions. Please call the main number to the clinic [phone] and follow the prompts.  For any non-urgent questions, you may also contact your provider using MyChart. We now offer e-Visits for anyone 77 and older to request care online for non-urgent symptoms. For details visit [email or web address].   Also download the MyChart app! Go to the app store, search "MyChart", open the app, select [facility], and log in with your MyChart username and password.  Masks are optional in the cancer centers. If you would like for your care team to wear a mask while they are taking care of you, please let them know. You may have one support person who is at least 64 years old accompany you for your appointments.

## 2021-11-27 NOTE — Progress Notes (Signed)
Kaitlyn Hull presented for PICC line flush. PICC line located left arm . Good blood return present. PICC line flushed with 18m NS and 300U/35mHeparin in each lumen Procedure without incident. Patient tolerated procedure well.  CRITICAL VALUE ALERT Critical value received:  WBC 0.3, platelets 9, ANC 0.0 Date of notification:  11-27-21 Time of notification: 103735ritical value read back:  Yes.   Nurse who received alert:  C. Moss Berry RN MD notified time and response:  1221, Dr. KaDelton Coombes Will give one unit of platelets [facility]   [facility]

## 2021-11-28 ENCOUNTER — Inpatient Hospital Stay: Payer: 59

## 2021-11-28 DIAGNOSIS — C9200 Acute myeloblastic leukemia, not having achieved remission: Secondary | ICD-10-CM

## 2021-11-28 LAB — GI PATHOGEN PANEL BY PCR, STOOL

## 2021-11-28 MED ORDER — ACETAMINOPHEN 325 MG PO TABS
650.0000 mg | ORAL_TABLET | Freq: Once | ORAL | Status: AC
  Administered 2021-11-28: 650 mg via ORAL
  Filled 2021-11-28: qty 2

## 2021-11-28 MED ORDER — SODIUM CHLORIDE 0.9% FLUSH
10.0000 mL | INTRAVENOUS | Status: AC | PRN
  Administered 2021-11-28: 10 mL

## 2021-11-28 MED ORDER — DIPHENHYDRAMINE HCL 25 MG PO CAPS
25.0000 mg | ORAL_CAPSULE | Freq: Once | ORAL | Status: AC
  Administered 2021-11-28: 25 mg via ORAL
  Filled 2021-11-28: qty 1

## 2021-11-28 MED ORDER — SODIUM CHLORIDE 0.9% IV SOLUTION
250.0000 mL | Freq: Once | INTRAVENOUS | Status: AC
  Administered 2021-11-28: 250 mL via INTRAVENOUS

## 2021-11-28 MED ORDER — HEPARIN SOD (PORK) LOCK FLUSH 100 UNIT/ML IV SOLN
250.0000 [IU] | INTRAVENOUS | Status: AC | PRN
  Administered 2021-11-28: 250 [IU]

## 2021-11-28 NOTE — Progress Notes (Signed)
Patient tolerated transfusion with no complaints voiced.  Side effects with management reviewed with understanding verbalized.  PICC line site clean and dry with no bruising or swelling noted at site.  Good blood return noted before and after administration.  Dressing intact.   Patient left in satisfactory condition with VSS and no s/s of distress noted.

## 2021-11-28 NOTE — Patient Instructions (Signed)
[facility]  Discharge Instructions: Thank you for choosing [facility] to provide your oncology and hematology care.  If you have a lab appointment with the Cancer Center, please come in thru the Main Entrance and check in at the main information desk.  Wear comfortable clothing and clothing appropriate for easy access to any Portacath or PICC line.   We strive to give you quality time with your provider. You may need to reschedule your appointment if you arrive late (15 or more minutes).  Arriving late affects you and other patients whose appointments are after yours.  Also, if you miss three or more appointments without notifying the office, you may be dismissed from the clinic at the provider's discretion.      For prescription refill requests, have your pharmacy contact our office and allow 72 hours for refills to be completed.     To help prevent nausea and vomiting after your treatment, we encourage you to take your nausea medication as directed.  BELOW ARE SYMPTOMS THAT SHOULD BE REPORTED IMMEDIATELY: *FEVER GREATER THAN 100.4 F (38 C) OR HIGHER *CHILLS OR SWEATING *NAUSEA AND VOMITING THAT IS NOT CONTROLLED WITH YOUR NAUSEA MEDICATION *UNUSUAL SHORTNESS OF BREATH *UNUSUAL BRUISING OR BLEEDING *URINARY PROBLEMS (pain or burning when urinating, or frequent urination) *BOWEL PROBLEMS (unusual diarrhea, constipation, pain near the anus) TENDERNESS IN MOUTH AND THROAT WITH OR WITHOUT PRESENCE OF ULCERS (sore throat, sores in mouth, or a toothache) UNUSUAL RASH, SWELLING OR PAIN  UNUSUAL VAGINAL DISCHARGE OR ITCHING   Items with * indicate a potential emergency and should be followed up as soon as possible or go to the Emergency Department if any problems should occur.  Please show the CHEMOTHERAPY ALERT CARD or IMMUNOTHERAPY ALERT CARD at check-in to the Emergency Department and triage nurse.  Should you have questions after your visit or need to  cancel or reschedule your appointment, please contact [facility] AT  [phone]  and follow the prompts.  Office hours are 8:00 a.m. to 4:30 p.m. Monday - Friday. Please note that voicemails left after 4:00 p.m. may not be returned until the following business day.  We are closed weekends and major holidays. You have access to a nurse at all times for urgent questions. Please call the main number to the clinic [phone] and follow the prompts.  For any non-urgent questions, you may also contact your provider using MyChart. We now offer e-Visits for anyone 18 and older to request care online for non-urgent symptoms. For details visit [email or web address].   Also download the MyChart app! Go to the app store, search "MyChart", open the app, select [facility], and log in with your MyChart username and password.  Masks are optional in the cancer centers. If you would like for your care team to wear a mask while they are taking care of you, please let them know. You may have one support person who is at least 65 years old accompany you for your appointments.  

## 2021-11-29 LAB — BPAM PLATELET PHERESIS
Blood Product Expiration Date: 202309282359
ISSUE DATE / TIME: 202309261021
Unit Type and Rh: 5100

## 2021-11-29 LAB — PREPARE PLATELET PHERESIS: Unit division: 0

## 2021-11-30 ENCOUNTER — Ambulatory Visit: Payer: 59 | Attending: Cardiology

## 2021-11-30 ENCOUNTER — Inpatient Hospital Stay (HOSPITAL_BASED_OUTPATIENT_CLINIC_OR_DEPARTMENT_OTHER): Payer: 59 | Admitting: Hematology

## 2021-11-30 ENCOUNTER — Inpatient Hospital Stay: Payer: 59

## 2021-11-30 ENCOUNTER — Telehealth: Payer: Self-pay

## 2021-11-30 VITALS — HR 104 | Ht 62.0 in | Wt 271.0 lb

## 2021-11-30 VITALS — BP 135/74 | HR 63 | Temp 98.2°F | Resp 18 | Ht 62.0 in | Wt 271.0 lb

## 2021-11-30 DIAGNOSIS — R799 Abnormal finding of blood chemistry, unspecified: Secondary | ICD-10-CM

## 2021-11-30 DIAGNOSIS — I482 Chronic atrial fibrillation, unspecified: Secondary | ICD-10-CM | POA: Diagnosis not present

## 2021-11-30 DIAGNOSIS — C9200 Acute myeloblastic leukemia, not having achieved remission: Secondary | ICD-10-CM | POA: Diagnosis not present

## 2021-11-30 DIAGNOSIS — R197 Diarrhea, unspecified: Secondary | ICD-10-CM | POA: Diagnosis not present

## 2021-11-30 LAB — CBC WITH DIFFERENTIAL/PLATELET
Abs Immature Granulocytes: 0.01 10*3/uL (ref 0.00–0.07)
Basophils Absolute: 0 10*3/uL (ref 0.0–0.1)
Basophils Relative: 0 %
Eosinophils Absolute: 0 10*3/uL (ref 0.0–0.5)
Eosinophils Relative: 0 %
HCT: 26.6 % — ABNORMAL LOW (ref 36.0–46.0)
Hemoglobin: 8.7 g/dL — ABNORMAL LOW (ref 12.0–15.0)
Immature Granulocytes: 5 %
Lymphocytes Relative: 90 %
Lymphs Abs: 0.2 10*3/uL — ABNORMAL LOW (ref 0.7–4.0)
MCH: 29.4 pg (ref 26.0–34.0)
MCHC: 32.7 g/dL (ref 30.0–36.0)
MCV: 89.9 fL (ref 80.0–100.0)
Monocytes Absolute: 0 10*3/uL — ABNORMAL LOW (ref 0.1–1.0)
Monocytes Relative: 5 %
Neutro Abs: 0 10*3/uL — CL (ref 1.7–7.7)
Neutrophils Relative %: 0 %
Platelets: 8 10*3/uL — CL (ref 150–400)
RBC: 2.96 MIL/uL — ABNORMAL LOW (ref 3.87–5.11)
RDW: 16.1 % — ABNORMAL HIGH (ref 11.5–15.5)
WBC: 0.2 10*3/uL — CL (ref 4.0–10.5)
nRBC: 0 % (ref 0.0–0.2)

## 2021-11-30 LAB — MAGNESIUM: Magnesium: 1.4 mg/dL — ABNORMAL LOW (ref 1.7–2.4)

## 2021-11-30 LAB — COMPREHENSIVE METABOLIC PANEL
ALT: 14 U/L (ref 0–44)
AST: 23 U/L (ref 15–41)
Albumin: 3.4 g/dL — ABNORMAL LOW (ref 3.5–5.0)
Alkaline Phosphatase: 65 U/L (ref 38–126)
Anion gap: 7 (ref 5–15)
BUN: 15 mg/dL (ref 8–23)
CO2: 24 mmol/L (ref 22–32)
Calcium: 8.8 mg/dL — ABNORMAL LOW (ref 8.9–10.3)
Chloride: 107 mmol/L (ref 98–111)
Creatinine, Ser: 1.02 mg/dL — ABNORMAL HIGH (ref 0.44–1.00)
GFR, Estimated: >60 mL/min (ref >60)
Glucose, Bld: 117 mg/dL — ABNORMAL HIGH (ref 70–99)
Potassium: 3.6 mmol/L (ref 3.5–5.1)
Sodium: 138 mmol/L (ref 135–145)
Total Bilirubin: 1.8 mg/dL — ABNORMAL HIGH (ref 0.3–1.2)
Total Protein: 6.5 g/dL (ref 6.5–8.1)

## 2021-11-30 LAB — SAMPLE TO BLOOD BANK

## 2021-11-30 MED ORDER — SODIUM CHLORIDE 0.9% FLUSH
10.0000 mL | INTRAVENOUS | Status: DC | PRN
  Administered 2021-11-30: 10 mL via INTRAVENOUS

## 2021-11-30 MED ORDER — HEPARIN SOD (PORK) LOCK FLUSH 100 UNIT/ML IV SOLN
250.0000 [IU] | Freq: Once | INTRAVENOUS | Status: AC
  Administered 2021-11-30: 250 [IU] via INTRAVENOUS

## 2021-11-30 MED ORDER — FUROSEMIDE 20 MG PO TABS: 20.0000 mg | ORAL_TABLET | Freq: Every day | ORAL | 1 refills | Status: AC | PRN

## 2021-11-30 NOTE — Telephone Encounter (Signed)
Heart rate mildlly elevated but overall reasonable, continue current meds   Zandra Abts MD

## 2021-11-30 NOTE — Progress Notes (Signed)
Kaitlyn Hull [address] [place] [address]   CLINIC:  Medical Oncology/Hematology  PCP:  [name], MD [place] [id number] [phone]   REASON FOR VISIT:  Follow-up for acute myeloid leukemia.  PRIOR THERAPY: None  NGS Results: Positive for RUNX1, U2AF1, BCOR, ZRSR2, NRAS, KRAS, and DNMT3A  CURRENT THERAPY: Decitabine and venetoclax  BRIEF ONCOLOGIC HISTORY:  Oncology History   No history exists.    CANCER STAGING: Cancer Staging  No matching staging information was found for the patient.   INTERVAL HISTORY:  Kaitlyn Hull 66 y.o. female seen for follow-up of acute myeloid leukemia.  She had admission at [facility] on 11/14/2021 and received first cycle of decitabine and venetoclax.  She is taking venetoclax 100 mg daily.  Reported some fatigue.  Reported decreased taste.  Continues to have diarrhea for the past 1 month, up to 10 stools per day, 5 days a week.  Denies any bleeding issues.  Denies any falls at home.    REVIEW OF SYSTEMS:  Review of Systems  Respiratory:  Positive for cough and shortness of breath.   Cardiovascular:  Positive for leg swelling.  Gastrointestinal:  Positive for diarrhea.  Psychiatric/Behavioral:  Positive for depression. The patient is nervous/anxious.   All other systems reviewed and are negative.    PAST MEDICAL/SURGICAL HISTORY:  Past Medical History:  Diagnosis Date   Atrial fibrillation with RVR ([place]) 06/2013   Breast cancer ([place]) 2007   left   Breast disorder    cancer   Diabetes mellitus    Type II   Dysrhythmia    Hematuria 01/20/2014   Hypertension    Hyperthyroidism 06/2013   Obesity    PMB (postmenopausal bleeding) 07/24/2012   Had 16.1 mm endometrium on [place] will get endo biopsy   Psoriasis    Urinary frequency 01/20/2014   Past Surgical History:  Procedure Laterality Date   BREAST BIOPSY Left 09/14/2020   Procedure: BREAST BIOPSY;  Surgeon: Aviva Signs, MD;   Location: [facility];  Service: General;  Laterality: Left;   BREAST SURGERY  02/20/06   left side    CESAREAN SECTION  1995   COLONOSCOPY WITH PROPOFOL N/A 10/28/2021   Procedure: COLONOSCOPY WITH PROPOFOL;  Surgeon: Daneil Dolin, MD;  Location: [facility];  Service: Endoscopy;  Laterality: N/A;   ESOPHAGOGASTRODUODENOSCOPY (EGD) WITH PROPOFOL N/A 10/28/2021   Procedure: ESOPHAGOGASTRODUODENOSCOPY (EGD) WITH PROPOFOL;  Surgeon: Daneil Dolin, MD;  Location: [facility];  Service: Endoscopy;  Laterality: N/A;   HYSTEROSCOPY WITH D & C N/A 08/13/2012   Procedure: DILATATION AND CURETTAGE /HYSTEROSCOPY;  Surgeon: Florian Buff, MD;  Location: [facility];  Service: Gynecology;  Laterality: N/A;   SECONDARY CLOSURE OF WOUND Left 02/20/2021   Procedure: SIMPLE WOUND CLOSURE;  Surgeon: Aviva Signs, MD;  Location: [facility];  Service: General;  Laterality: Left;     SOCIAL HISTORY:  Social History   Socioeconomic History   Marital status: Widowed    Spouse name: Not on file   Number of children: Not on file   Years of education: Not on file   Highest education level: Not on file  Occupational History   Not on file  Tobacco Use   Smoking status: Former    Packs/day: 0.01    Years: 1.00    Total pack years: 0.01    Types: Cigarettes    Start date: 03/05/1976    Quit date:  03/06/2003    Years since quitting: 18.7   Smokeless tobacco: Never  Vaping Use   Vaping Use: Never used  Substance and Sexual Activity   Alcohol use: No    Alcohol/week: 0.0 standard drinks of alcohol   Drug use: No   Sexual activity: Not Currently    Birth control/protection: Post-menopausal  Other Topics Concern   Not on file  Social History Narrative   Not on file   Social Determinants of Health   Financial Resource Strain: Not on file  Food Insecurity: Not on file  Transportation Needs: Not on file  Physical Activity: Not on file  Stress: Not on file  Social Connections: Not on file  Intimate Partner  Violence: Not on file    FAMILY HISTORY:  Family History  Problem Relation Age of Onset   Heart failure Father    CAD Father    Diabetes Sister    Other Sister        blood clots   Breast cancer Sister    Alzheimer's disease Mother    Diabetes Sister    Hypertension Maternal Grandmother    Colon cancer Neg Hx     CURRENT MEDICATIONS:  Outpatient Encounter Medications as of 11/30/2021  Medication Sig   acyclovir (ZOVIRAX) 400 MG tablet Take 400 mg by mouth 2 (two) times daily.   allopurinol (ZYLOPRIM) 300 MG tablet Take 1 tablet (300 mg total) by mouth daily.   ALPRAZolam (XANAX) 0.5 MG tablet Take 1 tablet by mouth 2 (two) times daily as needed.   diltiazem (CARTIA XT) 120 MG 24 hr capsule Take by mouth.   furosemide (LASIX) 20 MG tablet Take 1 tablet (20 mg total) by mouth daily as needed.   levofloxacin (LEVAQUIN) 500 MG tablet Take 500 mg by mouth daily.   MAGNESIUM OXIDE PO Take 133 mg by mouth 3 (three) times daily. Amino Acid Chelate   metFORMIN (GLUCOPHAGE-XR) 500 MG 24 hr tablet Take 500-1,000 mg by mouth See admin instructions. Take 500 mg by mouth in the morning and 1000 mg at bedtime   metoprolol tartrate (LOPRESSOR) 100 MG tablet Take 1 tablet (100 mg total) by mouth 2 (two) times daily.   posaconazole (NOXAFIL) 100 MG TBEC delayed-release tablet Take 300 mg by mouth daily.   pravastatin (PRAVACHOL) 40 MG tablet Take 40 mg by mouth at bedtime.   prochlorperazine (COMPAZINE) 10 MG tablet Take 10 mg by mouth as needed.   venetoclax (VENCLEXTA) 100 MG tablet Take 100 mg by mouth daily.   No facility-administered encounter medications on file as of 11/30/2021.    ALLERGIES:  No Known Allergies   PHYSICAL EXAM:  ECOG Performance status: 1  Vitals:   11/30/21 1006  BP: 135/74  Pulse: 63  Resp: 18  Temp: 98.2 F (36.8 C)  SpO2: 100%   Filed Weights   11/30/21 1006  Weight: 271 lb (122.9 kg)   Physical Exam Vitals reviewed.  Constitutional:       Appearance: Normal appearance.  HENT:     Mouth/Throat:     Pharynx: No oropharyngeal exudate.  Cardiovascular:     Rate and Rhythm: Normal rate and regular rhythm.     Heart sounds: Normal heart sounds.  Pulmonary:     Breath sounds: Normal breath sounds.  Abdominal:     Palpations: Abdomen is soft. There is no mass.  Neurological:     General: No focal deficit present.     Mental Status: She is alert and oriented  to person, place, and time.  Psychiatric:        Mood and Affect: Mood normal.        Behavior: Behavior normal.      LABORATORY DATA:  I have reviewed the labs as listed.  CBC    Component Value Date/Time   WBC 0.2 (LL) 11/30/2021 1015   RBC 2.96 (L) 11/30/2021 1015   HGB 8.7 (L) 11/30/2021 1015   HCT 26.6 (L) 11/30/2021 1015   PLT 8 (LL) 11/30/2021 1015   MCV 89.9 11/30/2021 1015   MCH 29.4 11/30/2021 1015   MCHC 32.7 11/30/2021 1015   RDW 16.1 (H) 11/30/2021 1015   LYMPHSABS 0.2 (L) 11/30/2021 1015   MONOABS 0.0 (L) 11/30/2021 1015   EOSABS 0.0 11/30/2021 1015   BASOSABS 0.0 11/30/2021 1015      Latest Ref Rng & Units 11/30/2021   10:15 AM 11/27/2021   11:00 AM 11/23/2021   11:07 AM  CMP  Glucose 70 - 99 mg/dL 117  116  122   BUN 8 - 23 mg/dL 15  16  16    Creatinine 0.44 - 1.00 mg/dL 1.02  1.06  0.97   Sodium 135 - 145 mmol/L 138  139  137   Potassium 3.5 - 5.1 mmol/L 3.6  4.1  4.0   Chloride 98 - 111 mmol/L 107  108  108   CO2 22 - 32 mmol/L 24  23  22    Calcium 8.9 - 10.3 mg/dL 8.8  9.0  8.8   Total Protein 6.5 - 8.1 g/dL 6.5  6.3  6.5   Total Bilirubin 0.3 - 1.2 mg/dL 1.8  2.1  1.8   Alkaline Phos 38 - 126 U/L 65  64  54   AST 15 - 41 U/L 23  22  20    ALT 0 - 44 U/L 14  14  11      DIAGNOSTIC IMAGING:  I have independently reviewed the scans and discussed with the patient.  ASSESSMENT:  1.  Acute myeloid leukemia (Dx 11/03/2021): - Presentation to the hospital on 10/26/2021 with hemoglobin 4.7 and platelet count 71. - BMBX (11/03/2021):  Hypercellular (80%) with increased blasts (20-25% by CD34).  Background hematopoiesis demonstrates trilineage dysplasia. - Chromosomes: 72, XX, +14[12]/48, XX, +8, +14[8].  Trisomy 14 commonly associated with MDS and t-MDS.  Trisomy 8 associated with AML, MDS and MPD. - FISH: Trisomy 8, - NGS: Positive for RUNX1, U2AF1, BCOR, ZRSR2, NRAS, KRAS, and DNMT3A. - Cycle 1 of decitabine (20 mg/m2) x5 days and venetoclax 100 mg daily (while taking posaconazole) or 200 mg daily (while not taking posaconazole but on diltiazem) on 11/14/2021.  2.  Social/family history: - She lives at home with her boyfriend.  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  No major chemical exposure.  Quit smoking 28 years ago.  Sister has breast cancer.  3.  Stage I left breast cancer: - Diagnosed 10 years ago, treated with lumpectomy followed by 6 weeks of radiation.  No chemotherapy.  Took antiestrogen therapy for 5 years.   PLAN:  1.  Acute myeloid leukemia: - She reports mild fatigue and altered taste.  Denies any GI symptoms other than diarrhea. - She is taking venetoclax 100 mg daily without any major problems. - Reviewed labs today.  White count is 0.2 and platelet count 8K.  Hemoglobin 8.7. - We will arrange for platelet transfusion tomorrow. - Continue twice weekly labs. - She is scheduled for bone marrow  biopsy at [facility] on 12/05/2021.  2.  Hypomagnesemia: - Magnesium is low at 1.4.  She is taking magnesium 133 mg 3 times daily. - She will receive magnesium 4 g IV.  3.  Diarrhea: - She reports having diarrhea since admission to the hospital on 10/26/2021.  She has up to 10 stools watery per day, 5 days every week. - C. difficile was tested at [facility] on 11/14/2021 which was negative. - GI pathogen panel in our clinic on 11/27/2021 was negative. - Plan to repeat C. difficile.  If continues to be negative, she may benefit from Imodium.  4.  Leg swellings: - She has 1+ edema at  the ankles. - We will start her on Lasix 20 mg in the mornings as needed.      Orders placed this encounter:  Orders Placed This Encounter  Procedures   C difficile quick screen w PCR reflex      Derek Jack, MD [place] [phone]

## 2021-11-30 NOTE — Progress Notes (Signed)
CRITICAL VALUE ALERT Critical value received:  WBC 0.20, platelets 8, ANC 0.0 Date of notification:  11-30-21 Time of notification: 1110 Critical value read back:  Yes.   Nurse who received alert:  C. Padraig Nhan RN MD notified time and response:  1115, will give one unit of platelets per orders and magnesium per orders. Dr. Delton Coombes

## 2021-11-30 NOTE — Telephone Encounter (Signed)
-----   Message from Arnoldo Lenis, MD sent at 11/30/2021  4:29 PM EDT -----    ----- Message ----- From: Berlinda Last, CMA Sent: 11/30/2021  11:46 AM EDT To: Arnoldo Lenis, MD  Will have Vicky send EKG

## 2021-11-30 NOTE — Telephone Encounter (Signed)
Pt notified and verbalized understanding.

## 2021-11-30 NOTE — Progress Notes (Signed)
Patient presents today for EKG per provider for HR check. Pt states that she feels pretty good, she just has a lack of energy and feels fatigued. Pt believes this may have more to do with her cancer though.  HR per EKG is 104 bpm. Will fwd to provider for review.

## 2021-11-30 NOTE — Progress Notes (Signed)
Heart rate mildlly elevated but overall reasonable, continue current meds  Zandra Abts MD

## 2021-11-30 NOTE — Patient Instructions (Addendum)
[facility]  Discharge Instructions  You were seen and examined today by Dr. Delton Coombes.  Your Magnesium is low, we will provide you with supplemental magnesium. Your platelets were also low and we will transfuse those. Your hemoglobin is 8.8 - so there is no need for a blood transfusion at this time.  We will continue to allow [facility] to drive treatment options at this point. We can take over your treatments at their preference. They typically let us know following a new bone marrow biopsy.  Prior to platelets, take 625m of Tylenol and 580mof Benadryl one hour prior to your appointment time.  Dr. KaDelton Coombesas prescribed Lasix 2038mo be taken daily when you notice swelling.  Follow-up as scheduled.  Thank you for choosing [facility] provide your oncology and hematology care.   To afford each patient quality time with our provider, please arrive at least 15 minutes before your scheduled appointment time. You may need to reschedule your appointment if you arrive late (10 or more minutes). Arriving late affects you and other patients whose appointments are after yours.  Also, if you miss three or more appointments without notifying the office, you may be dismissed from the clinic at the provider's discretion.    Again, [facility] is that these requests will decrease the amount of time that you wait before being seen by our physicians.   If you have a lab appointment with the [facility] come in thru the Main Entrance and check in at the main information desk.           _____________________________________________________________  Should you have questions after your visit to [facility] contact our office at ([phone] follow the prompts.  Our office hours are 8:00 a.m. to 4:30 p.m. Monday - Thursday and 8:00 a.m. to 2:30 p.m.  Friday.  Please note that voicemails left after 4:00 p.m. may not be returned until the following business day.  We are closed weekends and all major holidays.  You do have access to a nurse 24-7, just call the main number to the clinic [phone] do not press any options, hold on the line and a nurse will answer the phone.    For prescription refill requests, have your pharmacy contact our office and allow 72 hours.    Masks are optional in the cancer centers. If you would like for your care team to wear a mask while they are taking care of you, please let them know. You may have one support person who is at least 16 37ars old accompany you for your appointments.

## 2021-11-30 NOTE — Progress Notes (Signed)
Pt presents today for PICC line dressing change and labs per provider's order. PICC flushed easily without difficulty with 10 mL of normal saline in each lumen and  2.5 mL heparin in each lumen. Good blood return noted and no bruising or swelling noted at the site.  Discharged from clinic ambulatory in stable condition. Alert and oriented x 3. F/U with [facility] as scheduled.

## 2021-12-01 ENCOUNTER — Inpatient Hospital Stay: Payer: 59

## 2021-12-01 DIAGNOSIS — C9200 Acute myeloblastic leukemia, not having achieved remission: Secondary | ICD-10-CM | POA: Diagnosis not present

## 2021-12-01 DIAGNOSIS — R197 Diarrhea, unspecified: Secondary | ICD-10-CM

## 2021-12-01 LAB — C DIFFICILE QUICK SCREEN W PCR REFLEX
C Diff antigen: NEGATIVE
C Diff interpretation: NOT DETECTED
C Diff toxin: NEGATIVE

## 2021-12-01 MED ORDER — SODIUM CHLORIDE 0.9% FLUSH
10.0000 mL | INTRAVENOUS | Status: AC | PRN
  Administered 2021-12-01: 10 mL

## 2021-12-01 MED ORDER — MAGNESIUM SULFATE 2 GM/50ML IV SOLN
2.0000 g | INTRAVENOUS | Status: AC
  Administered 2021-12-01 (×2): 2 g via INTRAVENOUS
  Filled 2021-12-01 (×2): qty 50

## 2021-12-01 MED ORDER — SODIUM CHLORIDE 0.9% IV SOLUTION
250.0000 mL | Freq: Once | INTRAVENOUS | Status: AC
  Administered 2021-12-01: 250 mL via INTRAVENOUS

## 2021-12-01 MED ORDER — HEPARIN SOD (PORK) LOCK FLUSH 100 UNIT/ML IV SOLN
250.0000 [IU] | INTRAVENOUS | Status: AC | PRN
  Administered 2021-12-01: 250 [IU]

## 2021-12-01 NOTE — Patient Instructions (Signed)
[facility]  Discharge Instructions: Thank you for choosing [facility] to provide your oncology and hematology care.  If you have a lab appointment with the [facility], please come in thru the Main Entrance and check in at the main information desk.  Wear comfortable clothing and clothing appropriate for easy access to any Portacath or PICC line.   We strive to give you quality time with your provider. You may need to reschedule your appointment if you arrive late (15 or more minutes).  Arriving late affects you and other patients whose appointments are after yours.  Also, if you miss three or more appointments without notifying the office, you may be dismissed from the clinic at the provider's discretion.      For prescription refill requests, have your pharmacy contact our office and allow 72 hours for refills to be completed.    Today you received the following chemotherapy and/or immunotherapy agents Platelets magnesium      To help prevent nausea and vomiting after your treatment, we encourage you to take your nausea medication as directed.  BELOW ARE SYMPTOMS THAT SHOULD BE REPORTED IMMEDIATELY: *FEVER GREATER THAN 100.4 F (38 C) OR HIGHER *CHILLS OR SWEATING *NAUSEA AND VOMITING THAT IS NOT CONTROLLED WITH YOUR NAUSEA MEDICATION *UNUSUAL SHORTNESS OF BREATH *UNUSUAL BRUISING OR BLEEDING *URINARY PROBLEMS (pain or burning when urinating, or frequent urination) *BOWEL PROBLEMS (unusual diarrhea, constipation, pain near the anus) TENDERNESS IN MOUTH AND THROAT WITH OR WITHOUT PRESENCE OF ULCERS (sore throat, sores in mouth, or a toothache) UNUSUAL RASH, SWELLING OR PAIN  UNUSUAL VAGINAL DISCHARGE OR ITCHING   Items with * indicate a potential emergency and should be followed up as soon as possible or go to the Emergency Department if any problems should occur.  Please show the CHEMOTHERAPY ALERT CARD or IMMUNOTHERAPY ALERT CARD at check-in to  the Emergency Department and triage nurse.  Should you have questions after your visit or need to cancel or reschedule your appointment, please contact [facility] [phone]  and follow the prompts.  Office hours are 8:00 a.m. to 4:30 p.m. Monday - Friday. Please note that voicemails left after 4:00 p.m. may not be returned until the following business day.  We are closed weekends and major holidays. You have access to a nurse at all times for urgent questions. Please call the main number to the clinic [phone] and follow the prompts.  For any non-urgent questions, you may also contact your provider using MyChart. We now offer e-Visits for anyone 70 and older to request care online for non-urgent symptoms. For details visit [email or web address].   Also download the MyChart app! Go to the app store, search "MyChart", open the app, select [facility], and log in with your MyChart username and password.  Masks are optional in the cancer centers. If you would like for your care team to wear a mask while they are taking care of you, please let them know. You may have one support person who is at least 65 years old accompany you for your appointments.

## 2021-12-01 NOTE — Progress Notes (Signed)
Patient presents today for platelets and 4 grams magnesium per providers order.  Vital signs WNL.  Patient has no new complaints at this time.  Stable during infusion without adverse affects.  Vital signs stable.  No complaints at this time.  Discharge from clinic ambulatory in stable condition.  Alert and oriented X 3.  Follow up with [facility] as scheduled.

## 2021-12-02 ENCOUNTER — Encounter (HOSPITAL_COMMUNITY): Payer: Self-pay

## 2021-12-02 ENCOUNTER — Emergency Department (HOSPITAL_COMMUNITY)
Admission: EM | Admit: 2021-12-02 | Discharge: 2021-12-02 | Disposition: A | Discharge status: Home / Self Care | Payer: 59 | Attending: Emergency Medicine | Admitting: Emergency Medicine

## 2021-12-02 ENCOUNTER — Other Ambulatory Visit: Payer: Self-pay

## 2021-12-02 ENCOUNTER — Emergency Department (HOSPITAL_COMMUNITY): Payer: 59

## 2021-12-02 DIAGNOSIS — I10 Essential (primary) hypertension: Secondary | ICD-10-CM | POA: Diagnosis not present

## 2021-12-02 DIAGNOSIS — R079 Chest pain, unspecified: Secondary | ICD-10-CM | POA: Diagnosis not present

## 2021-12-02 DIAGNOSIS — Z7984 Long term (current) use of oral hypoglycemic drugs: Secondary | ICD-10-CM | POA: Insufficient documentation

## 2021-12-02 DIAGNOSIS — E119 Type 2 diabetes mellitus without complications: Secondary | ICD-10-CM | POA: Diagnosis not present

## 2021-12-02 DIAGNOSIS — D696 Thrombocytopenia, unspecified: Secondary | ICD-10-CM | POA: Diagnosis not present

## 2021-12-02 DIAGNOSIS — R0789 Other chest pain: Secondary | ICD-10-CM | POA: Diagnosis not present

## 2021-12-02 DIAGNOSIS — Z79899 Other long term (current) drug therapy: Secondary | ICD-10-CM | POA: Insufficient documentation

## 2021-12-02 LAB — TYPE AND SCREEN
ABO/RH(D): A POS
Antibody Screen: NEGATIVE

## 2021-12-02 LAB — BPAM PLATELET PHERESIS
Blood Product Expiration Date: 202309302359
ISSUE DATE / TIME: 202309290942
Unit Type and Rh: 6200

## 2021-12-02 LAB — PREPARE PLATELET PHERESIS: Unit division: 0

## 2021-12-02 LAB — CBC WITH DIFFERENTIAL/PLATELET
Abs Immature Granulocytes: 0.01 10*3/uL (ref 0.00–0.07)
Basophils Absolute: 0 10*3/uL (ref 0.0–0.1)
Basophils Relative: 0 %
Eosinophils Absolute: 0 10*3/uL (ref 0.0–0.5)
Eosinophils Relative: 0 %
HCT: 24.4 % — ABNORMAL LOW (ref 36.0–46.0)
Hemoglobin: 8.1 g/dL — ABNORMAL LOW (ref 12.0–15.0)
Immature Granulocytes: 3 %
Lymphocytes Relative: 89 %
Lymphs Abs: 0.3 10*3/uL — ABNORMAL LOW (ref 0.7–4.0)
MCH: 29.7 pg (ref 26.0–34.0)
MCHC: 33.2 g/dL (ref 30.0–36.0)
MCV: 89.4 fL (ref 80.0–100.0)
Monocytes Absolute: 0 10*3/uL — ABNORMAL LOW (ref 0.1–1.0)
Monocytes Relative: 4 %
Neutro Abs: 0 10*3/uL — CL (ref 1.7–7.7)
Neutrophils Relative %: 4 %
Platelets: 15 10*3/uL — CL (ref 150–400)
RBC: 2.73 MIL/uL — ABNORMAL LOW (ref 3.87–5.11)
RDW: 15.9 % — ABNORMAL HIGH (ref 11.5–15.5)
WBC: 0.3 10*3/uL — CL (ref 4.0–10.5)
nRBC: 0 % (ref 0.0–0.2)

## 2021-12-02 LAB — TROPONIN I (HIGH SENSITIVITY)
Troponin I (High Sensitivity): 5 ng/L (ref <18)
Troponin I (High Sensitivity): 5 ng/L (ref <18)

## 2021-12-02 LAB — COMPREHENSIVE METABOLIC PANEL
ALT: 12 U/L (ref 0–44)
AST: 20 U/L (ref 15–41)
Albumin: 3.3 g/dL — ABNORMAL LOW (ref 3.5–5.0)
Alkaline Phosphatase: 64 U/L (ref 38–126)
Anion gap: 11 (ref 5–15)
BUN: 14 mg/dL (ref 8–23)
CO2: 24 mmol/L (ref 22–32)
Calcium: 8.4 mg/dL — ABNORMAL LOW (ref 8.9–10.3)
Chloride: 101 mmol/L (ref 98–111)
Creatinine, Ser: 1.12 mg/dL — ABNORMAL HIGH (ref 0.44–1.00)
GFR, Estimated: 55 mL/min — ABNORMAL LOW (ref >60)
Glucose, Bld: 125 mg/dL — ABNORMAL HIGH (ref 70–99)
Potassium: 3.4 mmol/L — ABNORMAL LOW (ref 3.5–5.1)
Sodium: 136 mmol/L (ref 135–145)
Total Bilirubin: 1.9 mg/dL — ABNORMAL HIGH (ref 0.3–1.2)
Total Protein: 6.8 g/dL (ref 6.5–8.1)

## 2021-12-02 LAB — D-DIMER, QUANTITATIVE: D-Dimer, Quant: 3.15 ug/mL-FEU — ABNORMAL HIGH (ref 0.00–0.50)

## 2021-12-02 MED ORDER — IOHEXOL 350 MG/ML SOLN
75.0000 mL | Freq: Once | INTRAVENOUS | Status: AC | PRN
  Administered 2021-12-02: 75 mL via INTRAVENOUS

## 2021-12-02 MED ORDER — SODIUM CHLORIDE 0.9 % IV BOLUS
500.0000 mL | Freq: Once | INTRAVENOUS | Status: AC
  Administered 2021-12-02: 500 mL via INTRAVENOUS

## 2021-12-02 MED ORDER — SODIUM CHLORIDE 0.9 % IV SOLN
10.0000 mL/h | Freq: Once | INTRAVENOUS | Status: AC
  Administered 2021-12-02: 10 mL/h via INTRAVENOUS

## 2021-12-02 NOTE — ED Provider Notes (Signed)
3:10 PM Assumed care of patient from off-going team. For more details, please see note from same day.  In brief, this is a 66 y.o. female with CML who p/w chest pain.  Talked with oncology who states he can follow up in office.  Plan/Dispo at time of sign-out & ED Course since sign-out: [ ]  platelets  BP 113/77   Pulse 90   Temp 98.1 F (36.7 C) (Oral)   Resp (!) 24   Ht 5\' 2"  (1.575 m)   Wt 122.9 kg   LMP 11/17/2014   SpO2 100%   BMI 49.57 kg/m    ED Course:   Patient received platelets and is stable for dispo.     Dispo: DC w/ discharge instructions, f/u w/ oncology, return precautions ------------------------------- Vivi Barrack, MD Emergency Medicine  This note was created using dictation software, which may contain spelling or grammatical errors.   Loetta Rough, MD 12/05/21 [phone]

## 2021-12-02 NOTE — ED Triage Notes (Signed)
Intermittent chest pain since Thursday, no other sx reported, hx of A-fib, current CA patient, recent cardiology f/u

## 2021-12-02 NOTE — ED Provider Notes (Signed)
[facility] EMERGENCY DEPARTMENT Provider Note   CSN: [id number] Arrival date & time: 12/02/21  1740     History {Add pertinent medical, surgical, social history, OB history to HPI:1} Chief Complaint  Patient presents with   Chest Pain    Kaitlyn Hull is a 66 y.o. female.  Patient complains of chest discomfort for the last day off-and-on.  It hurts some with breathing but does not last very long.  Patient has a history of hypertension diabetes, anemia and AML.  The history is provided by the patient and medical records. No language interpreter was used.  Chest Pain Pain location:  R chest Pain quality: aching   Pain radiates to:  Does not radiate Pain severity:  Mild Onset quality:  Sudden Timing:  Intermittent Progression:  Resolved Chronicity:  New Context: not breathing   Relieved by:  Nothing Associated symptoms: no abdominal pain, no back pain, no cough, no fatigue and no headache        Home Medications Prior to Admission medications   Medication Sig Start Date End Date Taking? Authorizing Provider  acyclovir (ZOVIRAX) 400 MG tablet Take 400 mg by mouth 2 (two) times daily. 11/18/21   [provider]  allopurinol (ZYLOPRIM) 300 MG tablet Take 1 tablet (300 mg total) by mouth daily. 10/30/21 10/30/22  Roxan Hockey, MD  ALPRAZolam Duanne Moron) 0.5 MG tablet Take 1 tablet by mouth 2 (two) times daily as needed. 10/30/21 10/30/22  [provider]  diltiazem (CARTIA XT) 120 MG 24 hr capsule Take by mouth. 10/18/16   [provider]  furosemide (LASIX) 20 MG tablet Take 1 tablet (20 mg total) by mouth daily as needed. 11/30/21   Derek Jack, MD  levofloxacin (LEVAQUIN) 500 MG tablet Take 500 mg by mouth daily. 11/18/21   [provider]  MAGNESIUM OXIDE PO Take 133 mg by mouth 3 (three) times daily. Amino Acid Chelate    [provider]  metFORMIN (GLUCOPHAGE-XR) 500 MG 24 hr tablet Take 500-1,000 mg by mouth See admin  instructions. Take 500 mg by mouth in the morning and 1000 mg at bedtime 03/26/18   [provider]  metoprolol tartrate (LOPRESSOR) 100 MG tablet Take 1 tablet (100 mg total) by mouth 2 (two) times daily. 11/23/21 11/18/22  Arnoldo Lenis, MD  posaconazole (NOXAFIL) 100 MG TBEC delayed-release tablet Take 300 mg by mouth daily. 11/18/21   [provider]  pravastatin (PRAVACHOL) 40 MG tablet Take 40 mg by mouth at bedtime.    [provider]  prochlorperazine (COMPAZINE) 10 MG tablet Take 10 mg by mouth as needed. 11/18/21   [provider]  venetoclax (VENCLEXTA) 100 MG tablet Take 100 mg by mouth daily. 11/13/21   [provider]      Allergies    Patient has no known allergies.    Review of Systems   Review of Systems  Constitutional:  Negative for appetite change and fatigue.  HENT:  Negative for congestion, ear discharge and sinus pressure.   Eyes:  Negative for discharge.  Respiratory:  Negative for cough.   Cardiovascular:  Positive for chest pain.  Gastrointestinal:  Negative for abdominal pain and diarrhea.  Genitourinary:  Negative for frequency and hematuria.  Musculoskeletal:  Negative for back pain.  Skin:  Negative for rash.  Neurological:  Negative for seizures and headaches.  Psychiatric/Behavioral:  Negative for hallucinations.     Physical Exam Updated Vital Signs BP 115/72   Pulse 87   Temp  98.1 F (36.7 C) (Oral)   Resp (!) 26   Ht '5\' 2"'$  (1.575 m)   Wt 122.9 kg   LMP 11/17/2014   SpO2 100%   BMI 49.57 kg/m  Physical Exam Vitals and nursing note reviewed.  Constitutional:      Appearance: She is well-developed.  HENT:     Head: Normocephalic.     Nose: Nose normal.  Eyes:     General: No scleral icterus.    Conjunctiva/sclera: Conjunctivae normal.  Neck:     Thyroid: No thyromegaly.  Cardiovascular:     Rate and Rhythm: Normal rate and regular rhythm.     Heart sounds: No murmur heard.    No friction  rub. No gallop.  Pulmonary:     Breath sounds: No stridor. No wheezing or rales.  Chest:     Chest wall: No tenderness.  Abdominal:     General: There is no distension.     Tenderness: There is no abdominal tenderness. There is no rebound.  Musculoskeletal:        General: Normal range of motion.     Cervical back: Neck supple.  Lymphadenopathy:     Cervical: No cervical adenopathy.  Skin:    Findings: No erythema or rash.  Neurological:     Mental Status: She is alert and oriented to person, place, and time.     Motor: No abnormal muscle tone.     Coordination: Coordination normal.  Psychiatric:        Behavior: Behavior normal.     ED Results / Procedures / Treatments   Labs (all labs ordered are listed, but only abnormal results are displayed) Labs Reviewed  CBC WITH DIFFERENTIAL/PLATELET - Abnormal; Notable for the following components:      Result Value   WBC 0.3 (*)    RBC 2.73 (*)    Hemoglobin 8.1 (*)    HCT 24.4 (*)    RDW 15.9 (*)    Platelets 15 (*)    Neutro Abs 0.0 (*)    Lymphs Abs 0.3 (*)    Monocytes Absolute 0.0 (*)    All other components within normal limits  COMPREHENSIVE METABOLIC PANEL - Abnormal; Notable for the following components:   Potassium 3.4 (*)    Glucose, Bld 125 (*)    Creatinine, Ser 1.12 (*)    Calcium 8.4 (*)    Albumin 3.3 (*)    Total Bilirubin 1.9 (*)    GFR, Estimated 55 (*)    All other components within normal limits  D-DIMER, QUANTITATIVE - Abnormal; Notable for the following components:   D-Dimer, Quant 3.15 (*)    All other components within normal limits  TYPE AND SCREEN  PREPARE PLATELET PHERESIS  TROPONIN I (HIGH SENSITIVITY)  TROPONIN I (HIGH SENSITIVITY)    EKG EKG Interpretation  Date/Time:  Saturday December 02 2021 07:13:14 EDT Ventricular Rate:  95 PR Interval:    QRS Duration: 124 QT Interval:  340 QTC Calculation: 423 R Axis:   48 Text Interpretation: Atrial fibrillation Nonspecific  intraventricular conduction delay Repol abnrm suggests ischemia, inferior leads Confirmed by Milton Ferguson [phone]) on 12/02/2021 11:25:17 AM  Radiology CT Angio Chest PE W and/or Wo Contrast  Result Date: 12/02/2021 CLINICAL DATA:  Rule out pulmonary embolism. Intermittent chest pain since Thursday. EXAM: CT ANGIOGRAPHY CHEST WITH CONTRAST TECHNIQUE: Multidetector CT imaging of the chest was performed using the standard protocol during bolus administration of intravenous contrast. Multiplanar CT image reconstructions and MIPs  were obtained to evaluate the vascular anatomy. RADIATION DOSE REDUCTION: This exam was performed according to the departmental dose-optimization program which includes automated exposure control, adjustment of the mA and/or kV according to patient size and/or use of iterative reconstruction technique. CONTRAST:  52m OMNIPAQUE IOHEXOL 350 MG/ML SOLN COMPARISON:  03/07/2014. FINDINGS: Cardiovascular: Satisfactory opacification of the pulmonary arteries to the segmental level. No evidence of pulmonary embolism. There is mild cardiac enlargement without pericardial effusion. Aortic atherosclerotic calcifications. Mediastinum/Nodes: No enlarged mediastinal, hilar, or axillary lymph nodes. Thyroid gland, trachea, and esophagus demonstrate no significant findings. Lungs/Pleura: Trace right pleural fluid. Subpleural banding and parenchymal scarring within the right lower lobe noted. There is a mild mosaic attenuation pattern noted bilaterally. There is no suspicious pulmonary nodule or mass identified bilaterally. No airspace consolidation, atelectasis or pneumothorax. Upper Abdomen: Cirrhotic liver with nodular contour identified. No acute abnormality identified. Musculoskeletal: Postsurgical change within the medial left breast noted with overlying skin thickening. Dissection clips noted within the left axilla. Age-indeterminate superior endplate compression deformity is noted at the  approximate L1 level with loss of 15% of the vertebral body height, image 103/8. Multilevel degenerative disc disease noted within the thoracic spine. Review of the MIP images confirms the above findings. IMPRESSION: 1. No evidence for acute pulmonary embolism. 2. Trace right pleural fluid with mild mosaic attenuation pattern noted bilaterally. Findings are nonspecific but may reflect underlying small airways disease. 3. Cirrhotic liver. 4. Age-indeterminate superior endplate compression deformity is noted at the approximate L1 level with loss of 15% of the vertebral body height. 5. Postsurgical change within the medial left breast with overlying skin thickening. Electronically Signed   By: TKerby MoorsM.D.   On: 12/02/2021 10:47   DG Chest Port 1 View  Result Date: 12/02/2021 CLINICAL DATA:  Pain. EXAM: PORTABLE CHEST 1 VIEW COMPARISON:  10/26/2021 FINDINGS: There is a left chest wall port a catheter with tip in the cavoatrial junction. Mild cardiac enlargement. Unchanged asymmetric elevation of right hemidiaphragm. No pleural effusion or edema. No airspace opacities identified. IMPRESSION: 1. No acute cardiopulmonary abnormalities. 2. Stable asymmetric elevation of right hemidiaphragm. Electronically Signed   By: TKerby MoorsM.D.   On: 12/02/2021 07:53    Procedures Procedures  {Document cardiac monitor, telemetry assessment procedure when appropriate:1}  Medications Ordered in ED Medications  0.9 %  sodium chloride infusion (has no administration in time range)  iohexol (OMNIPAQUE) 350 MG/ML injection 75 mL (75 mLs Intravenous Contrast Given 12/02/21 1017)  sodium chloride 0.9 % bolus 500 mL (500 mLs Intravenous New Bag/Given 12/02/21 1148)    ED Course/ Medical Decision Making/ A&P  Patient with atypical chest pain.  CT angio is negative along with 2 troponins.  I do not believe this is coronary artery disease causing the pain.  I spoke with her oncologist Dr. KDelton Coombesand he states that  he would like [place] give her 1 unit of platelets and he will see her next week in the office.  He just saw the patient 2 days ago and her leukopenia and thrombocytopenia has actually improved    CRITICAL CARE Performed by: JMilton FergusonTotal critical care time: 40 minutes Critical care time was exclusive of separately billable procedures and treating other patients. Critical care was necessary to treat or prevent imminent or life-threatening deterioration. Critical care was time spent personally by me on the following activities: development of treatment plan with patient and/or surrogate as well as nursing, discussions with consultants, evaluation of patient's response  to treatment, examination of patient, obtaining history from patient or surrogate, ordering and performing treatments and interventions, ordering and review of laboratory studies, ordering and review of radiographic studies, pulse oximetry and re-evaluation of patient's condition.                          Medical Decision Making Amount and/or Complexity of Data Reviewed Labs: ordered. Radiology: ordered.  Risk Prescription drug management.   Patient with atypical chest pain.  She will follow-up with her oncologist and she will receive 1 unit of platelets for thrombocytopenia  {Document critical care time when appropriate:1} {Document review of labs and clinical decision tools ie heart score, Chads2Vasc2 etc:1}  {Document your independent review of radiology images, and any outside records:1} {Document your discussion with family members, caretakers, and with consultants:1} {Document social determinants of health affecting pt's care:1} {Document your decision making why or why not admission, treatments were needed:1} Final Clinical Impression(s) / ED Diagnoses Final diagnoses:  Atypical chest pain  Thrombocytopenia ([place])    Rx / DC Orders ED Discharge Orders     None

## 2021-12-03 LAB — BPAM PLATELET PHERESIS
Blood Product Expiration Date: 202310032359
ISSUE DATE / TIME: 202309301618
Unit Type and Rh: 6200

## 2021-12-03 LAB — PREPARE PLATELET PHERESIS: Unit division: 0

## 2021-12-04 ENCOUNTER — Inpatient Hospital Stay: Payer: PPO | Attending: Hematology

## 2021-12-04 ENCOUNTER — Encounter: Payer: Self-pay | Admitting: Hematology

## 2021-12-04 VITALS — BP 116/96 | HR 111 | Temp 98.6°F | Resp 20

## 2021-12-04 DIAGNOSIS — Z79899 Other long term (current) drug therapy: Secondary | ICD-10-CM | POA: Insufficient documentation

## 2021-12-04 DIAGNOSIS — Z923 Personal history of irradiation: Secondary | ICD-10-CM | POA: Insufficient documentation

## 2021-12-04 DIAGNOSIS — R609 Edema, unspecified: Secondary | ICD-10-CM | POA: Insufficient documentation

## 2021-12-04 DIAGNOSIS — Z7969 Long term (current) use of other immunomodulators and immunosuppressants: Secondary | ICD-10-CM | POA: Insufficient documentation

## 2021-12-04 DIAGNOSIS — C9200 Acute myeloblastic leukemia, not having achieved remission: Secondary | ICD-10-CM | POA: Insufficient documentation

## 2021-12-04 DIAGNOSIS — R799 Abnormal finding of blood chemistry, unspecified: Secondary | ICD-10-CM

## 2021-12-04 DIAGNOSIS — Z5111 Encounter for antineoplastic chemotherapy: Secondary | ICD-10-CM | POA: Insufficient documentation

## 2021-12-04 DIAGNOSIS — D709 Neutropenia, unspecified: Secondary | ICD-10-CM | POA: Diagnosis not present

## 2021-12-04 DIAGNOSIS — Z452 Encounter for adjustment and management of vascular access device: Secondary | ICD-10-CM | POA: Insufficient documentation

## 2021-12-04 DIAGNOSIS — R197 Diarrhea, unspecified: Secondary | ICD-10-CM | POA: Insufficient documentation

## 2021-12-04 DIAGNOSIS — M7989 Other specified soft tissue disorders: Secondary | ICD-10-CM | POA: Insufficient documentation

## 2021-12-04 DIAGNOSIS — Z853 Personal history of malignant neoplasm of breast: Secondary | ICD-10-CM | POA: Insufficient documentation

## 2021-12-04 DIAGNOSIS — Z87891 Personal history of nicotine dependence: Secondary | ICD-10-CM | POA: Insufficient documentation

## 2021-12-04 DIAGNOSIS — Z803 Family history of malignant neoplasm of breast: Secondary | ICD-10-CM | POA: Insufficient documentation

## 2021-12-04 LAB — CBC WITH DIFFERENTIAL/PLATELET
Abs Immature Granulocytes: 0 10*3/uL (ref 0.00–0.07)
Basophils Absolute: 0 10*3/uL (ref 0.0–0.1)
Basophils Relative: 0 %
Eosinophils Absolute: 0 10*3/uL (ref 0.0–0.5)
Eosinophils Relative: 0 %
HCT: 22.9 % — ABNORMAL LOW (ref 36.0–46.0)
Hemoglobin: 7.5 g/dL — ABNORMAL LOW (ref 12.0–15.0)
Immature Granulocytes: 0 %
Lymphocytes Relative: 86 %
Lymphs Abs: 0.2 10*3/uL — ABNORMAL LOW (ref 0.7–4.0)
MCH: 29.6 pg (ref 26.0–34.0)
MCHC: 32.8 g/dL (ref 30.0–36.0)
MCV: 90.5 fL (ref 80.0–100.0)
Monocytes Absolute: 0 10*3/uL — ABNORMAL LOW (ref 0.1–1.0)
Monocytes Relative: 0 %
Neutro Abs: 0 10*3/uL — CL (ref 1.7–7.7)
Neutrophils Relative %: 14 %
Platelets: 24 10*3/uL — CL (ref 150–400)
RBC: 2.53 MIL/uL — ABNORMAL LOW (ref 3.87–5.11)
RDW: 15.8 % — ABNORMAL HIGH (ref 11.5–15.5)
WBC: 0.2 10*3/uL — CL (ref 4.0–10.5)
nRBC: 0 % (ref 0.0–0.2)

## 2021-12-04 LAB — COMPREHENSIVE METABOLIC PANEL
ALT: 10 U/L (ref 0–44)
AST: 18 U/L (ref 15–41)
Albumin: 3.2 g/dL — ABNORMAL LOW (ref 3.5–5.0)
Alkaline Phosphatase: 59 U/L (ref 38–126)
Anion gap: 8 (ref 5–15)
BUN: 18 mg/dL (ref 8–23)
CO2: 23 mmol/L (ref 22–32)
Calcium: 8.6 mg/dL — ABNORMAL LOW (ref 8.9–10.3)
Chloride: 105 mmol/L (ref 98–111)
Creatinine, Ser: 1.03 mg/dL — ABNORMAL HIGH (ref 0.44–1.00)
GFR, Estimated: >60 mL/min (ref >60)
Glucose, Bld: 118 mg/dL — ABNORMAL HIGH (ref 70–99)
Potassium: 3.4 mmol/L — ABNORMAL LOW (ref 3.5–5.1)
Sodium: 136 mmol/L (ref 135–145)
Total Bilirubin: 1.6 mg/dL — ABNORMAL HIGH (ref 0.3–1.2)
Total Protein: 6.7 g/dL (ref 6.5–8.1)

## 2021-12-04 LAB — MAGNESIUM: Magnesium: 1.5 mg/dL — ABNORMAL LOW (ref 1.7–2.4)

## 2021-12-04 LAB — SAMPLE TO BLOOD BANK

## 2021-12-04 MED ORDER — HEPARIN SOD (PORK) LOCK FLUSH 100 UNIT/ML IV SOLN
500.0000 [IU] | Freq: Once | INTRAVENOUS | Status: AC
  Administered 2021-12-04: 250 [IU] via INTRAVENOUS

## 2021-12-04 MED ORDER — SODIUM CHLORIDE FLUSH 0.9 % IV SOLN
10.0000 mL | Freq: Once | INTRAVENOUS | Status: DC
  Filled 2021-12-04: qty 10

## 2021-12-04 MED ORDER — SODIUM CHLORIDE 0.9% FLUSH
10.0000 mL | Freq: Once | INTRAVENOUS | Status: AC
  Administered 2021-12-04: 10 mL via INTRAVENOUS

## 2021-12-04 NOTE — Progress Notes (Signed)
Patient present for labs and PICC line flush.  Purple flushed easily with good blood return and labs drawn.  Unable to flush the red side.  Patient has an appointment with [facility] and will let them know about the red side not flushing.  No complaints of pain with the purple side and flushing.  Dressing intact and no complaints of pain.  No s/s of distress noted.  Dr. Delton Coombes made aware.   Labs faxed and call report to the triage nurse at [facility].

## 2021-12-04 NOTE — Progress Notes (Signed)
CRITICAL VALUE ALERT Critical value received:  ANC 0.0, Platelets 24, WBC 0.2.  Date of notification:  12/04/2021 Time of notification: 12:38 pm Critical value read back:  Yes.   Nurse who received alert:  B. Daniel Johndrow RN MD notified time and response:  [place] 12:48pm / A. Beckie Salts.

## 2021-12-05 ENCOUNTER — Inpatient Hospital Stay: Payer: PPO

## 2021-12-07 ENCOUNTER — Other Ambulatory Visit: Payer: Self-pay

## 2021-12-07 ENCOUNTER — Inpatient Hospital Stay (HOSPITAL_COMMUNITY)
Admission: EM | Admit: 2021-12-07 | Discharge: 2021-12-11 | DRG: 809 | Disposition: A | Discharge status: Home / Self Care | Payer: PPO | Attending: Family Medicine | Admitting: Family Medicine

## 2021-12-07 ENCOUNTER — Encounter (HOSPITAL_COMMUNITY): Payer: Self-pay | Admitting: *Deleted

## 2021-12-07 ENCOUNTER — Emergency Department (HOSPITAL_COMMUNITY): Payer: PPO

## 2021-12-07 ENCOUNTER — Inpatient Hospital Stay: Payer: PPO

## 2021-12-07 DIAGNOSIS — R799 Abnormal finding of blood chemistry, unspecified: Secondary | ICD-10-CM

## 2021-12-07 DIAGNOSIS — D61818 Other pancytopenia: Secondary | ICD-10-CM

## 2021-12-07 DIAGNOSIS — R5081 Fever presenting with conditions classified elsewhere: Secondary | ICD-10-CM | POA: Diagnosis present

## 2021-12-07 DIAGNOSIS — Z79899 Other long term (current) drug therapy: Secondary | ICD-10-CM

## 2021-12-07 DIAGNOSIS — Z8249 Family history of ischemic heart disease and other diseases of the circulatory system: Secondary | ICD-10-CM

## 2021-12-07 DIAGNOSIS — D701 Agranulocytosis secondary to cancer chemotherapy: Secondary | ICD-10-CM | POA: Diagnosis not present

## 2021-12-07 DIAGNOSIS — Z87891 Personal history of nicotine dependence: Secondary | ICD-10-CM | POA: Diagnosis not present

## 2021-12-07 DIAGNOSIS — Z1152 Encounter for screening for COVID-19: Secondary | ICD-10-CM

## 2021-12-07 DIAGNOSIS — Z853 Personal history of malignant neoplasm of breast: Secondary | ICD-10-CM | POA: Diagnosis not present

## 2021-12-07 DIAGNOSIS — I4821 Permanent atrial fibrillation: Secondary | ICD-10-CM | POA: Diagnosis present

## 2021-12-07 DIAGNOSIS — L03211 Cellulitis of face: Secondary | ICD-10-CM | POA: Diagnosis present

## 2021-12-07 DIAGNOSIS — I482 Chronic atrial fibrillation, unspecified: Secondary | ICD-10-CM | POA: Diagnosis not present

## 2021-12-07 DIAGNOSIS — T451X5A Adverse effect of antineoplastic and immunosuppressive drugs, initial encounter: Secondary | ICD-10-CM

## 2021-12-07 DIAGNOSIS — Z7984 Long term (current) use of oral hypoglycemic drugs: Secondary | ICD-10-CM | POA: Diagnosis not present

## 2021-12-07 DIAGNOSIS — C9200 Acute myeloblastic leukemia, not having achieved remission: Secondary | ICD-10-CM

## 2021-12-07 DIAGNOSIS — E119 Type 2 diabetes mellitus without complications: Secondary | ICD-10-CM | POA: Diagnosis present

## 2021-12-07 DIAGNOSIS — D649 Anemia, unspecified: Secondary | ICD-10-CM

## 2021-12-07 DIAGNOSIS — D849 Immunodeficiency, unspecified: Secondary | ICD-10-CM | POA: Diagnosis present

## 2021-12-07 DIAGNOSIS — D6959 Other secondary thrombocytopenia: Secondary | ICD-10-CM | POA: Diagnosis present

## 2021-12-07 DIAGNOSIS — Z9221 Personal history of antineoplastic chemotherapy: Secondary | ICD-10-CM | POA: Diagnosis not present

## 2021-12-07 DIAGNOSIS — I10 Essential (primary) hypertension: Secondary | ICD-10-CM | POA: Diagnosis present

## 2021-12-07 DIAGNOSIS — E876 Hypokalemia: Secondary | ICD-10-CM | POA: Diagnosis present

## 2021-12-07 DIAGNOSIS — D61810 Antineoplastic chemotherapy induced pancytopenia: Secondary | ICD-10-CM | POA: Diagnosis present

## 2021-12-07 DIAGNOSIS — E785 Hyperlipidemia, unspecified: Secondary | ICD-10-CM | POA: Diagnosis present

## 2021-12-07 DIAGNOSIS — D709 Neutropenia, unspecified: Secondary | ICD-10-CM | POA: Diagnosis present

## 2021-12-07 DIAGNOSIS — Z6841 Body Mass Index (BMI) 40.0 and over, adult: Secondary | ICD-10-CM

## 2021-12-07 DIAGNOSIS — A419 Sepsis, unspecified organism: Principal | ICD-10-CM

## 2021-12-07 LAB — COMPREHENSIVE METABOLIC PANEL
ALT: 9 U/L (ref 0–44)
AST: 15 U/L (ref 15–41)
Albumin: 3.1 g/dL — ABNORMAL LOW (ref 3.5–5.0)
Alkaline Phosphatase: 64 U/L (ref 38–126)
Anion gap: 10 (ref 5–15)
BUN: 15 mg/dL (ref 8–23)
CO2: 22 mmol/L (ref 22–32)
Calcium: 8.6 mg/dL — ABNORMAL LOW (ref 8.9–10.3)
Chloride: 102 mmol/L (ref 98–111)
Creatinine, Ser: 0.93 mg/dL (ref 0.44–1.00)
GFR, Estimated: >60 mL/min (ref >60)
Glucose, Bld: 134 mg/dL — ABNORMAL HIGH (ref 70–99)
Potassium: 3.4 mmol/L — ABNORMAL LOW (ref 3.5–5.1)
Sodium: 134 mmol/L — ABNORMAL LOW (ref 135–145)
Total Bilirubin: 2.7 mg/dL — ABNORMAL HIGH (ref 0.3–1.2)
Total Protein: 6.9 g/dL (ref 6.5–8.1)

## 2021-12-07 LAB — URINALYSIS, ROUTINE W REFLEX MICROSCOPIC
Bilirubin Urine: NEGATIVE
Glucose, UA: NEGATIVE mg/dL
Ketones, ur: 5 mg/dL — AB
Leukocytes,Ua: NEGATIVE
Nitrite: NEGATIVE
Protein, ur: 30 mg/dL — AB
Specific Gravity, Urine: 1.018 (ref 1.005–1.030)
pH: 6 (ref 5.0–8.0)

## 2021-12-07 LAB — CBC WITH DIFFERENTIAL/PLATELET
Abs Immature Granulocytes: 0 10*3/uL (ref 0.00–0.07)
Basophils Absolute: 0 10*3/uL (ref 0.0–0.1)
Basophils Relative: 0 %
Eosinophils Absolute: 0 10*3/uL (ref 0.0–0.5)
Eosinophils Relative: 0 %
HCT: 23.5 % — ABNORMAL LOW (ref 36.0–46.0)
Hemoglobin: 8 g/dL — ABNORMAL LOW (ref 12.0–15.0)
Immature Granulocytes: 0 %
Lymphocytes Relative: 84 %
Lymphs Abs: 0.1 10*3/uL — ABNORMAL LOW (ref 0.7–4.0)
MCH: 30.1 pg (ref 26.0–34.0)
MCHC: 34 g/dL (ref 30.0–36.0)
MCV: 88.3 fL (ref 80.0–100.0)
Monocytes Absolute: 0 10*3/uL — ABNORMAL LOW (ref 0.1–1.0)
Monocytes Relative: 8 %
Neutro Abs: 0 10*3/uL — CL (ref 1.7–7.7)
Neutrophils Relative %: 8 %
Platelets: 30 10*3/uL — ABNORMAL LOW (ref 150–400)
RBC: 2.66 MIL/uL — ABNORMAL LOW (ref 3.87–5.11)
RDW: 14.9 % (ref 11.5–15.5)
WBC: 0.1 10*3/uL — CL (ref 4.0–10.5)
nRBC: 0 % (ref 0.0–0.2)

## 2021-12-07 LAB — APTT: aPTT: 196 seconds (ref 24–36)

## 2021-12-07 LAB — MAGNESIUM: Magnesium: 1.5 mg/dL — ABNORMAL LOW (ref 1.7–2.4)

## 2021-12-07 LAB — LACTIC ACID, PLASMA
Lactic Acid, Venous: 1.6 mmol/L (ref 0.5–1.9)
Lactic Acid, Venous: 0.9 mmol/L (ref 0.5–1.9)

## 2021-12-07 LAB — RESP PANEL BY RT-PCR (FLU A&B, COVID) ARPGX2
Influenza A by PCR: NEGATIVE
Influenza B by PCR: NEGATIVE
SARS Coronavirus 2 by RT PCR: NEGATIVE

## 2021-12-07 LAB — SAMPLE TO BLOOD BANK

## 2021-12-07 LAB — GLUCOSE, CAPILLARY: Glucose-Capillary: 143 mg/dL — ABNORMAL HIGH (ref 70–99)

## 2021-12-07 LAB — PROTIME-INR
INR: 1.2 (ref 0.8–1.2)
Prothrombin Time: 15.2 seconds (ref 11.4–15.2)

## 2021-12-07 MED ORDER — VENETOCLAX 100 MG PO TABS: 100.0000 mg | ORAL_TABLET | Freq: Every day | ORAL | Status: DC

## 2021-12-07 MED ORDER — PRAVASTATIN SODIUM 40 MG PO TABS
40.0000 mg | ORAL_TABLET | Freq: Every day | ORAL | Status: DC
  Administered 2021-12-07 – 2021-12-10 (×4): 40 mg via ORAL
  Filled 2021-12-07 (×4): qty 1

## 2021-12-07 MED ORDER — SODIUM CHLORIDE 0.9% FLUSH: 3.0000 mL | INTRAVENOUS | Status: DC | PRN

## 2021-12-07 MED ORDER — LACTATED RINGERS IV SOLN: INTRAVENOUS | Status: DC

## 2021-12-07 MED ORDER — ACETAMINOPHEN 650 MG RE SUPP: 650.0000 mg | Freq: Four times a day (QID) | RECTAL | Status: DC | PRN

## 2021-12-07 MED ORDER — SODIUM CHLORIDE 0.9 % IV SOLN
2.0000 g | Freq: Once | INTRAVENOUS | Status: AC
  Administered 2021-12-07: 2 g via INTRAVENOUS
  Filled 2021-12-07: qty 12.5

## 2021-12-07 MED ORDER — ONDANSETRON HCL 4 MG PO TABS: 4.0000 mg | ORAL_TABLET | Freq: Four times a day (QID) | ORAL | Status: DC | PRN

## 2021-12-07 MED ORDER — VANCOMYCIN HCL 2000 MG/400ML IV SOLN
2000.0000 mg | Freq: Once | INTRAVENOUS | Status: AC
  Administered 2021-12-07: 2000 mg via INTRAVENOUS
  Filled 2021-12-07: qty 400

## 2021-12-07 MED ORDER — METOPROLOL TARTRATE 50 MG PO TABS
100.0000 mg | ORAL_TABLET | Freq: Two times a day (BID) | ORAL | Status: DC
  Administered 2021-12-07 – 2021-12-11 (×8): 100 mg via ORAL
  Filled 2021-12-07 (×8): qty 2

## 2021-12-07 MED ORDER — HEPARIN SODIUM (PORCINE) 5000 UNIT/ML IJ SOLN: 5000.0000 [IU] | Freq: Three times a day (TID) | INTRAMUSCULAR | Status: DC

## 2021-12-07 MED ORDER — MAGNESIUM OXIDE -MG SUPPLEMENT 400 (240 MG) MG PO TABS
400.0000 mg | ORAL_TABLET | Freq: Two times a day (BID) | ORAL | Status: DC
  Administered 2021-12-07 – 2021-12-11 (×8): 400 mg via ORAL
  Filled 2021-12-07 (×8): qty 1

## 2021-12-07 MED ORDER — MAGNESIUM SULFATE 2 GM/50ML IV SOLN
2.0000 g | Freq: Once | INTRAVENOUS | Status: AC
  Administered 2021-12-07: 2 g via INTRAVENOUS
  Filled 2021-12-07: qty 50

## 2021-12-07 MED ORDER — INSULIN ASPART 100 UNIT/ML IJ SOLN: 0.0000 [IU] | Freq: Every day | INTRAMUSCULAR | Status: DC

## 2021-12-07 MED ORDER — SODIUM CHLORIDE 0.9 % IV SOLN: 2.0000 g | Freq: Three times a day (TID) | INTRAVENOUS | Status: DC

## 2021-12-07 MED ORDER — ONDANSETRON HCL 4 MG/2ML IJ SOLN: 4.0000 mg | Freq: Four times a day (QID) | INTRAMUSCULAR | Status: DC | PRN

## 2021-12-07 MED ORDER — POLYETHYLENE GLYCOL 3350 17 G PO PACK: 17.0000 g | PACK | Freq: Every day | ORAL | Status: DC | PRN

## 2021-12-07 MED ORDER — SODIUM CHLORIDE 0.9 % IV SOLN: INTRAVENOUS | Status: DC | PRN

## 2021-12-07 MED ORDER — SODIUM CHLORIDE 0.9 % IV SOLN
2.0000 g | Freq: Three times a day (TID) | INTRAVENOUS | Status: DC
  Administered 2021-12-07 – 2021-12-11 (×12): 2 g via INTRAVENOUS
  Filled 2021-12-07 (×12): qty 12.5

## 2021-12-07 MED ORDER — TRAZODONE HCL 50 MG PO TABS
50.0000 mg | ORAL_TABLET | Freq: Every evening | ORAL | Status: DC | PRN
  Administered 2021-12-09 – 2021-12-10 (×2): 50 mg via ORAL
  Filled 2021-12-07 (×2): qty 1

## 2021-12-07 MED ORDER — ACYCLOVIR 800 MG PO TABS
400.0000 mg | ORAL_TABLET | Freq: Two times a day (BID) | ORAL | Status: DC
  Administered 2021-12-07 – 2021-12-11 (×8): 400 mg via ORAL
  Filled 2021-12-07 (×8): qty 1

## 2021-12-07 MED ORDER — ACETAMINOPHEN 325 MG PO TABS
650.0000 mg | ORAL_TABLET | Freq: Four times a day (QID) | ORAL | Status: DC | PRN
  Administered 2021-12-09 – 2021-12-10 (×2): 650 mg via ORAL
  Filled 2021-12-07 (×2): qty 2

## 2021-12-07 MED ORDER — ADULT MULTIVITAMIN W/MINERALS CH
1.0000 | ORAL_TABLET | Freq: Every day | ORAL | Status: DC
  Administered 2021-12-08 – 2021-12-11 (×4): 1 via ORAL
  Filled 2021-12-07 (×5): qty 1

## 2021-12-07 MED ORDER — ALLOPURINOL 100 MG PO TABS
300.0000 mg | ORAL_TABLET | Freq: Every day | ORAL | Status: DC
  Administered 2021-12-08 – 2021-12-11 (×4): 300 mg via ORAL
  Filled 2021-12-07 (×5): qty 3

## 2021-12-07 MED ORDER — BISACODYL 10 MG RE SUPP: 10.0000 mg | Freq: Every day | RECTAL | Status: DC | PRN

## 2021-12-07 MED ORDER — SODIUM CHLORIDE 0.9% FLUSH
3.0000 mL | Freq: Two times a day (BID) | INTRAVENOUS | Status: DC
  Administered 2021-12-07 – 2021-12-11 (×7): 3 mL via INTRAVENOUS

## 2021-12-07 MED ORDER — POTASSIUM CHLORIDE CRYS ER 20 MEQ PO TBCR
40.0000 meq | EXTENDED_RELEASE_TABLET | ORAL | Status: AC
  Administered 2021-12-07 – 2021-12-08 (×2): 40 meq via ORAL
  Filled 2021-12-07 (×2): qty 2

## 2021-12-07 MED ORDER — INSULIN ASPART 100 UNIT/ML IJ SOLN
0.0000 [IU] | Freq: Three times a day (TID) | INTRAMUSCULAR | Status: DC
  Administered 2021-12-08: 1 [IU] via SUBCUTANEOUS

## 2021-12-07 MED ORDER — POSACONAZOLE 100 MG PO TBEC
100.0000 mg | DELAYED_RELEASE_TABLET | Freq: Every day | ORAL | Status: DC
  Filled 2021-12-07 (×4): qty 1

## 2021-12-07 MED ORDER — PROCHLORPERAZINE MALEATE 5 MG PO TABS: 10.0000 mg | ORAL_TABLET | ORAL | Status: DC | PRN

## 2021-12-07 MED ORDER — ALPRAZOLAM 0.5 MG PO TABS: 0.5000 mg | ORAL_TABLET | Freq: Two times a day (BID) | ORAL | Status: DC | PRN

## 2021-12-07 MED ORDER — SODIUM CHLORIDE 0.9% FLUSH
3.0000 mL | Freq: Two times a day (BID) | INTRAVENOUS | Status: DC
  Administered 2021-12-07 – 2021-12-09 (×5): 3 mL via INTRAVENOUS

## 2021-12-07 MED ORDER — VANCOMYCIN HCL 1500 MG/300ML IV SOLN
1500.0000 mg | INTRAVENOUS | Status: DC
  Administered 2021-12-08 – 2021-12-09 (×2): 1500 mg via INTRAVENOUS
  Filled 2021-12-07 (×2): qty 300

## 2021-12-07 NOTE — Progress Notes (Signed)
11:00 Called the ER and gave report to Renelda Loma RN/Charge Nurse.

## 2021-12-07 NOTE — ED Triage Notes (Signed)
Pt with left ear redness and swelling x 2 days.  Pt sent from cancer center. Denies any fevers. Denies any trouble swallowing or breathing.

## 2021-12-07 NOTE — Sepsis Progress Note (Signed)
Secure chat with EMT-P . He was able to get blood cultures drawn prior to antibiotics administered.

## 2021-12-07 NOTE — Sepsis Progress Note (Signed)
Code sepsis protocol being monitored by eLink. 

## 2021-12-07 NOTE — Progress Notes (Addendum)
Pharmacy Antibiotic Note  Kaitlyn Hull is a 66 y.o. female admitted on 12/07/2021 with  febrile neutropenia .  Pharmacy has been consulted for vancomycin dosing.  Plan: Vancomycin 2000 mg IV x 1 dose. Vancomycin 1500 mg IV every 24 hours. Cefepime 2000 mg IV every 8 hours. Monitor labs, c/s, and vanco level as indicated.  Height: '5\' 2"'$  (157.5 cm) Weight: 126.6 kg (279 lb) IBW/kg (Calculated) : 50.1  Temp (24hrs), Avg:98.4 F (36.9 C), Min:98.2 F (36.8 C), Max:98.5 F (36.9 C)  Recent Labs  Lab 12/02/21 0715 12/04/21 1128 12/04/21 1129 12/07/21 1100  WBC 0.3*  --  0.2* 0.1*  CREATININE 1.12* 1.03*  --  0.93    Estimated Creatinine Clearance: 77.9 mL/min (by C-G formula based on SCr of 0.93 mg/dL).    No Known Allergies  Antimicrobials this admission: Vanco 10/5 >> Cefepime 10/5 >>  Microbiology results: 10/5 BCx: pending 10/5 UCx: pending    Thank you for allowing pharmacy to be a part of this patient's care.  Margot Ables, PharmD Clinical Pharmacist 12/07/2021 12:36 PM

## 2021-12-07 NOTE — H&P (Signed)
Patient Demographics:    Kaitlyn Hull, is a 66 y.o. female  MRN: [id number]   DOB - Apr 23, 1955  Admit Date - 12/07/2021  Outpatient Primary MD for the patient is [name], MD   Assessment & Plan:   Assessment and Plan:   1)Neutropenic fever/left-sided facial cellulitis----patient has a left arm PICC line placed about 3 weeks ago -Left-sided facial cellulitis noted--- patient started on 12/05/2021 -Patient with profound leukopenia with ANC of 0.0%, and WBC of 0.1 -Observe protective neutropenic precautions for patient -Cefepime and vancomycin pending culture data -Chest x-ray and UA not suggestive of infection -Discussed with Dr. Delton Coombes he advises against Granix at this time -Patient does not meet sepsis criteria  2)Acute Myeloid Leukemia with severe leukopenia, severe anemia and severe thrombocytopenia- -persistent pancytopenia -Continue allopurinol with concerns for tumor lysis syndrome -Continue prophylactic acyclovir -transfuse PRBC and platelets if patient meets threshold -Patient received transfusion on 12/06/2021  3) chronic atrial fibrillation--- discontinued Eliquis due to severe anemia and severe thrombocytopenia in the setting of AML on chemotherapy -Continue Cardizem CD and metoprolol for rate control  4)DM2-.  A1c 6.2% reflecting excellent diabetic control PTA -hold metformin Use Novolog/Humalog Sliding scale insulin with Accu-Cheks/Fingersticks as ordered   5)Hypokalemia and  hypomagnesemia--- replace and recheck  6)Morbid Obesity- -Low calorie diet, portion control and increase physical activity discussed with patient -Body mass index is 51.03 kg/m.   Disposition/Need for in-Hospital Stay- patient unable to be discharged at this time due to - -neutropenic fevers/cellulitis  requiring IV antibiotics pending culture data and is severely pancytopenic patient  Status is: Inpatient  Remains inpatient appropriate because:   Dispo: The patient is from: Home              Anticipated d/c is to: Home              Anticipated d/c date is: > 3 days              Patient currently is not medically stable to d/c. Barriers: Not Clinically Stable-    With History of - Reviewed by me  Past Medical History:  Diagnosis Date   Atrial fibrillation with RVR ([place]) 06/2013   Breast cancer ([place]) 2007   left   Breast disorder    cancer   Diabetes mellitus    Type II   Dysrhythmia    Hematuria 01/20/2014   Hypertension    Hyperthyroidism 06/2013   Obesity    PMB (postmenopausal bleeding) 07/24/2012   Had 16.1 mm endometrium on [place] will get endo biopsy   Psoriasis    Urinary frequency 01/20/2014      Past Surgical History:  Procedure Laterality Date   BREAST BIOPSY Left 09/14/2020   Procedure: BREAST BIOPSY;  Surgeon: Aviva Signs, MD;  Location: [facility];  Service: General;  Laterality: Left;   BREAST SURGERY  02/20/06   left side    [place]  COLONOSCOPY WITH PROPOFOL N/A 10/28/2021   Procedure: COLONOSCOPY WITH PROPOFOL;  Surgeon: Daneil Dolin, MD;  Location: [facility];  Service: Endoscopy;  Laterality: N/A;   ESOPHAGOGASTRODUODENOSCOPY (EGD) WITH PROPOFOL N/A 10/28/2021   Procedure: ESOPHAGOGASTRODUODENOSCOPY (EGD) WITH PROPOFOL;  Surgeon: Daneil Dolin, MD;  Location: [facility];  Service: Endoscopy;  Laterality: N/A;   HYSTEROSCOPY WITH D & C N/A 08/13/2012   Procedure: DILATATION AND CURETTAGE /HYSTEROSCOPY;  Surgeon: Florian Buff, MD;  Location: [facility];  Service: Gynecology;  Laterality: N/A;   SECONDARY CLOSURE OF WOUND Left 02/20/2021   Procedure: SIMPLE WOUND CLOSURE;  Surgeon: Aviva Signs, MD;  Location: [facility];  Service: General;  Laterality: Left;    Chief Complaint  Patient presents with   Facial Swelling      HPI:     Kaitlyn Hull  is a 66 y.o. female with past medical history relevant for  permanent A-fib , history of breast cancer, diabetes, hypertension, HLD and and morbid obesity recently diagnosed with AML undergoing chemotherapy--- presenting with redness over the left side of the face that started on 12/05/2021 associated with chills and pancytopenia -Patient received transfusion of PRBC on 12/06/2021 -Had oncologist called today due to concerns about neutropenic fevers - No Nausea, Vomiting or Diarrhea -No productive cough no sick contacts at home -Check x-ray without acute findings -No urinary symptoms and UA is not suggestive of UTI -Potassium is low at 3.4 with sodium 134 -Creatinine 0.93, LFTs are not elevated -Magnesium is low at 1.5 -WBC 0.1 with ANC of 0.0% -Hemoglobin is up to 8.0 -Platelets 30 k   Review of systems:    In addition to the HPI above,   A full Review of  Systems was done, all other systems reviewed are negative except as noted above in HPI , .   Social History:  Reviewed by me    Social History   Tobacco Use   Smoking status: Former    Packs/day: 0.01    Years: 1.00    Total pack years: 0.01    Types: Cigarettes    Start date: 03/05/1976    Quit date: 03/06/2003    Years since quitting: 18.7   Smokeless tobacco: Never  Substance Use Topics   Alcohol use: No    Alcohol/week: 0.0 standard drinks of alcohol     Family History :  Reviewed by me    Family History  Problem Relation Age of Onset   Heart failure Father    CAD Father    Diabetes Sister    Other Sister        blood clots   Breast cancer Sister    Alzheimer's disease Mother    Diabetes Sister    Hypertension Maternal Grandmother    Colon cancer Neg Hx      Home Medications:   Prior to Admission medications   Medication Sig Start Date End Date Taking? Authorizing Provider  acetaminophen (TYLENOL) 500 MG tablet Take 500 mg by mouth every 6 (six) hours as needed. Taken as needed for  headache   Yes [provider]  acyclovir (ZOVIRAX) 400 MG tablet Take 400 mg by mouth 2 (two) times daily. 11/18/21  Yes [provider]  allopurinol (ZYLOPRIM) 300 MG tablet Take 1 tablet (300 mg total) by mouth daily. 10/30/21 10/30/22 Yes Rachella Basden, MD  ALPRAZolam Duanne Moron) 0.5 MG tablet Take 1 tablet by mouth 2 (two) times daily as needed. 10/30/21 10/30/22 Yes [provider]  furosemide (LASIX) 20 MG tablet Take 1 tablet (20 mg total) by mouth daily as needed. 11/30/21  Yes Derek Jack, MD  levofloxacin (LEVAQUIN) 500 MG tablet Take 500 mg by mouth daily. 11/18/21  Yes [provider]  MAGNESIUM OXIDE PO Take 266 mg by mouth 3 (three) times daily. 2 tablets TID   Yes [provider]  metFORMIN (GLUCOPHAGE-XR) 500 MG 24 hr tablet Take 500-1,000 mg by mouth See admin instructions. Take 500 mg by mouth in the morning and 1000 mg at bedtime 03/26/18  Yes [provider]  metoprolol tartrate (LOPRESSOR) 100 MG tablet Take 1 tablet (100 mg total) by mouth 2 (two) times daily. 11/23/21 11/18/22 Yes Branch, Alphonse Guild, MD  posaconazole (NOXAFIL) 100 MG TBEC delayed-release tablet Take 100 mg by mouth daily. 11/18/21  Yes [provider]  pravastatin (PRAVACHOL) 40 MG tablet Take 40 mg by mouth at bedtime.   Yes [provider]  prochlorperazine (COMPAZINE) 10 MG tablet Take 10 mg by mouth as needed. 11/18/21  Yes [provider]  venetoclax (VENCLEXTA) 100 MG tablet Take 100 mg by mouth daily. 11/13/21  Yes [provider]  diltiazem (CARTIA XT) 120 MG 24 hr capsule Take by mouth. Patient not taking: Reported on 12/07/2021 10/18/16   [provider]     Allergies:    No Known Allergies   Physical Exam:   Vitals  Blood pressure 128/76, pulse (!) 107, temperature 98.5 F (36.9 C), temperature source Oral, resp. rate 20, height '5\' 2"'$  (1.575 m), weight 126.6 kg, last menstrual period 11/17/2014,  SpO2 100 %.  Physical Examination: General appearance - alert,  in no distress Mental status - alert, oriented to person, place, and time,  Eyes - sclera anicteric Neck - supple, no JVD elevation , Chest - clear  to auscultation bilaterally, symmetrical air movement,  Heart - S1 and S2 normal, regular  Abdomen - soft, nontender, nondistended, +BS, increased truncal adiposity Neurological - screening mental status exam normal, neck supple without rigidity, cranial nerves II through XII intact, DTR's normal and symmetric Extremities - no pedal edema noted, intact peripheral pulses , left arm PICC line site is clean dry and intact Skin -left-sided facial and neck erythema as below   Media Information   Document Information  Photos    12/07/2021 17:53  Attached To:  Hospital Encounter on 12/07/21   Source Information  Roxan Hockey, MD  [facility]      Data Review:    CBC Recent Labs  Lab 12/02/21 0715 12/04/21 1129 12/07/21 1100  WBC 0.3* 0.2* 0.1*  HGB 8.1* 7.5* 8.0*  HCT 24.4* 22.9* 23.5*  PLT 15* 24* 30*  MCV 89.4 90.5 88.3  MCH 29.7 29.6 30.1  MCHC 33.2 32.8 34.0  RDW 15.9* 15.8* 14.9  LYMPHSABS 0.3* 0.2* 0.1*  MONOABS 0.0* 0.0* 0.0*  EOSABS 0.0 0.0 0.0  BASOSABS 0.0 0.0 0.0   ------------------------------------------------------------------------------------------------------------------  Chemistries  Recent Labs  Lab 12/02/21 0715 12/04/21 1128 12/07/21 1100  NA 136 136 134*  K 3.4* 3.4* 3.4*  CL 101 105 102  CO2 '24 23 22  '$ GLUCOSE 125* 118* 134*  BUN '14 18 15  '$ CREATININE 1.12* 1.03* 0.93  CALCIUM 8.4* 8.6* 8.6*  MG  --  1.5* 1.5*  AST '20 18 15  '$ ALT '12 10 9  '$ ALKPHOS 64 59 64  BILITOT 1.9* 1.6* 2.7*   ------------------------------------------------------------------------------------------------------------------ estimated creatinine clearance is 77.9 mL/min (by C-G formula based on SCr of 0.93  mg/dL). ------------------------------------------------------------------------------------------------------------------ Coagulation profile Recent Labs  Lab 12/07/21 1238  INR 1.2   -----------------------------------------------------------------------------------------------------------------    Component Value Date/Time   BNP 307.0 (H) 10/26/2021 0937   Urinalysis    Component Value Date/Time   COLORURINE YELLOW 12/07/2021 [place] 12/07/2021 1715   LABSPEC 1.018 12/07/2021 1715   PHURINE 6.0 12/07/2021 1715   GLUCOSEU NEGATIVE 12/07/2021 1715   HGBUR SMALL (A) 12/07/2021 1715   BILIRUBINUR NEGATIVE 12/07/2021 1715   KETONESUR 5 (A) 12/07/2021 1715   PROTEINUR 30 (A) 12/07/2021 1715   UROBILINOGEN 1 01/20/2014 1620   NITRITE NEGATIVE 12/07/2021 1715   LEUKOCYTESUR NEGATIVE 12/07/2021 1715    Imaging Results:    DG Chest Port 1 View  Result Date: 12/07/2021 CLINICAL DATA:  Questionable sepsis - evaluate for abnormality EXAM: PORTABLE CHEST 1 VIEW COMPARISON:  12/02/2021 FINDINGS: Mild cardiomegaly. Slightly low lung volumes. No focal airspace consolidation, pleural effusion, or pneumothorax. IMPRESSION: No active disease. Electronically Signed   By: Davina Poke D.O.   On: 12/07/2021 12:44    Radiological Exams on Admission: DG Chest Port 1 View  Result Date: 12/07/2021 CLINICAL DATA:  Questionable sepsis - evaluate for abnormality EXAM: PORTABLE CHEST 1 VIEW COMPARISON:  12/02/2021 FINDINGS: Mild cardiomegaly. Slightly low lung volumes. No focal airspace consolidation, pleural effusion, or pneumothorax. IMPRESSION: No active disease. Electronically Signed   By: Davina Poke D.O.   On: 12/07/2021 12:44    DVT Prophylaxis -SCD   AM Labs Ordered, also please review Full Orders  Family Communication: Admission, patients condition and plan of care including tests being ordered have been discussed with the patient  who indicate understanding and  agree with the plan   Condition   stable  Roxan Hockey M.D on 12/07/2021 at 6:48 PM Go to [email or web address] -  for contact info  [facility] - Office  [phone]

## 2021-12-07 NOTE — Progress Notes (Signed)
Patient presented to clinic today for labs and dressing change. Upon entering room, RN noticed her left ear, cheek, neck red and swollen, tender to touch. MD notified and in room to assess. Patient stated she noticed it was a little sore on Tuesday the 3rd late that afternoon, then it got worse, patient stated she could not touch it yesterday, feels little better today.   Picc line assessed, no signs of infection present. Labs drawn per protocol. Will send pt down to the Emergency room for further assessment. Patient transported via wheelchair in stable condition to the emergency department .

## 2021-12-07 NOTE — ED Provider Notes (Signed)
[facility] EMERGENCY DEPARTMENT Provider Note   CSN: [id number] Arrival date & time: 12/07/21  1110     History  Chief Complaint  Patient presents with   Facial Swelling    LUVERTA KORTE is a 66 y.o. female.  HPI   This patient is a 66 year old female, she has a known history of AML currently on chemotherapy, also has a history of diabetes on metformin, hypertension on metoprolol, atrial fibrillation on diltiazem but not anticoagulated.  She receives intermittent blood transfusions and during her work-up at the cancer center this morning for her chronic anemia she was noted to have significant redness of the left side of her face as well as the left ear thought to be related to a skin and soft tissue infection.  She has not had objective fevers but has increasing pain and spreading redness.  Dr. Delton Coombes called and requested the patient be admitted for IV antibiotics with sepsis order set and culture use.  Requested neutropenic fever treatment.  Patient denies abdominal pain or vomiting chest pain or coughing or shortness of breath.  Home Medications Prior to Admission medications   Medication Sig Start Date End Date Taking? Authorizing Provider  acetaminophen (TYLENOL) 500 MG tablet Take 500 mg by mouth every 6 (six) hours as needed. Taken as needed for headache   Yes [provider]  acyclovir (ZOVIRAX) 400 MG tablet Take 400 mg by mouth 2 (two) times daily. 11/18/21  Yes [provider]  allopurinol (ZYLOPRIM) 300 MG tablet Take 1 tablet (300 mg total) by mouth daily. 10/30/21 10/30/22 Yes Emokpae, Courage, MD  ALPRAZolam Duanne Moron) 0.5 MG tablet Take 1 tablet by mouth 2 (two) times daily as needed. 10/30/21 10/30/22 Yes [provider]  furosemide (LASIX) 20 MG tablet Take 1 tablet (20 mg total) by mouth daily as needed. 11/30/21  Yes Derek Jack, MD  levofloxacin (LEVAQUIN) 500 MG tablet Take 500 mg by mouth daily. 11/18/21  Yes [provider]   MAGNESIUM OXIDE PO Take 266 mg by mouth 3 (three) times daily. 2 tablets TID   Yes [provider]  metFORMIN (GLUCOPHAGE-XR) 500 MG 24 hr tablet Take 500-1,000 mg by mouth See admin instructions. Take 500 mg by mouth in the morning and 1000 mg at bedtime 03/26/18  Yes [provider]  metoprolol tartrate (LOPRESSOR) 100 MG tablet Take 1 tablet (100 mg total) by mouth 2 (two) times daily. 11/23/21 11/18/22 Yes Branch, Alphonse Guild, MD  posaconazole (NOXAFIL) 100 MG TBEC delayed-release tablet Take 100 mg by mouth daily. 11/18/21  Yes [provider]  pravastatin (PRAVACHOL) 40 MG tablet Take 40 mg by mouth at bedtime.   Yes [provider]  prochlorperazine (COMPAZINE) 10 MG tablet Take 10 mg by mouth as needed. 11/18/21  Yes [provider]  venetoclax (VENCLEXTA) 100 MG tablet Take 100 mg by mouth daily. 11/13/21  Yes [provider]  diltiazem (CARTIA XT) 120 MG 24 hr capsule Take by mouth. Patient not taking: Reported on 12/07/2021 10/18/16   [provider]      Allergies    Patient has no known allergies.    Review of Systems   Review of Systems  Physical Exam Updated Vital Signs BP 132/71   Pulse (!) 110   Temp 98.5 F (36.9 C) (Oral)   Resp (!) 26   Ht 1.575 m ('5\' 2"'$ )   Wt 126.6 kg   LMP 11/17/2014   SpO2 100%   BMI 51.03 kg/m  Physical Exam Vitals and nursing note reviewed.  Constitutional:      General: She is in acute distress.     Appearance: She is well-developed.  HENT:     Head: Normocephalic and atraumatic.     Mouth/Throat:     Pharynx: No oropharyngeal exudate.  Eyes:     General: No scleral icterus.       Right eye: No discharge.        Left eye: No discharge.     Conjunctiva/sclera: Conjunctivae normal.     Pupils: Pupils are equal, round, and reactive to light.  Neck:     Thyroid: No thyromegaly.     Vascular: No JVD.  Cardiovascular:     Rate and Rhythm: Tachycardia present. Rhythm irregular.      Heart sounds: Normal heart sounds. No murmur heard.    No friction rub. No gallop.  Pulmonary:     Effort: Pulmonary effort is normal. No respiratory distress.     Breath sounds: Normal breath sounds. No wheezing or rales.  Abdominal:     General: Bowel sounds are normal. There is no distension.     Palpations: Abdomen is soft. There is no mass.     Tenderness: There is no abdominal tenderness.  Musculoskeletal:        General: No tenderness. Normal range of motion.     Cervical back: Normal range of motion and neck supple.  Lymphadenopathy:     Cervical: No cervical adenopathy.  Skin:    General: Skin is warm and dry.     Findings: Rash present. No erythema.  Neurological:     General: No focal deficit present.     Mental Status: She is alert.     Coordination: Coordination normal.  Psychiatric:        Behavior: Behavior normal.     ED Results / Procedures / Treatments   Labs (all labs ordered are listed, but only abnormal results are displayed) Labs Reviewed  APTT - Abnormal; Notable for the following components:      Result Value   aPTT 196 (*)    All other components within normal limits  RESP PANEL BY RT-PCR (FLU A&B, COVID) ARPGX2  CULTURE, BLOOD (ROUTINE X 2)  CULTURE, BLOOD (ROUTINE X 2)  URINE CULTURE  LACTIC ACID, PLASMA  PROTIME-INR  LACTIC ACID, PLASMA  CBC WITH DIFFERENTIAL/PLATELET  URINALYSIS, ROUTINE W REFLEX MICROSCOPIC    EKG None  Radiology DG Chest Port 1 View  Result Date: 12/07/2021 CLINICAL DATA:  Questionable sepsis - evaluate for abnormality EXAM: PORTABLE CHEST 1 VIEW COMPARISON:  12/02/2021 FINDINGS: Mild cardiomegaly. Slightly low lung volumes. No focal airspace consolidation, pleural effusion, or pneumothorax. IMPRESSION: No active disease. Electronically Signed   By: Davina Poke D.O.   On: 12/07/2021 12:44    Procedures .Critical Care  Performed by: Noemi Chapel, MD Authorized by: Noemi Chapel, MD   Critical care  provider statement:    Critical care time (minutes):  30   Critical care time was exclusive of:  Separately billable procedures and treating other patients and teaching time   Critical care was necessary to treat or prevent imminent or life-threatening deterioration of the following conditions:  Sepsis   Critical care was time spent personally by me on the following activities:  Development of treatment plan with patient or surrogate, discussions with consultants, evaluation of patient's response to treatment, examination of patient, ordering and review of laboratory studies, ordering and review of radiographic studies, ordering  and performing treatments and interventions, pulse oximetry, re-evaluation of patient's condition, review of old charts and obtaining history from patient or surrogate   I assumed direction of critical care for this patient from another provider in my specialty: no     Care discussed with: admitting provider   Comments:           Medications Ordered in ED Medications  lactated ringers infusion ( Intravenous New Bag/Given 12/07/21 1222)  vancomycin (VANCOREADY) IVPB 2000 mg/400 mL (2,000 mg Intravenous New Bag/Given 12/07/21 1225)  vancomycin (VANCOREADY) IVPB 1500 mg/300 mL (has no administration in time range)  ceFEPIme (MAXIPIME) 2 g in sodium chloride 0.9 % 100 mL IVPB (0 g Intravenous Stopped 12/07/21 1336)    ED Course/ Medical Decision Making/ A&P                           Medical Decision Making Amount and/or Complexity of Data Reviewed Labs: ordered. Radiology: ordered. ECG/medicine tests: ordered.  Risk Prescription drug management. Decision regarding hospitalization.   This patient presents to the ED for concern of sepsis, this involves an extensive number of treatment options, and is a complaint that carries with it a high risk of complications and morbidity.  The differential diagnosis includes sepsis, cellulitis, erysipelas, patient denies any  insect bites and there is no itching, this is tender red and hot to the touch, she is tachycardic all consistent with sepsis   Co morbidities that complicate the patient evaluation  Immunodeficiency   Additional history obtained:  Additional history obtained from discussion with oncology as well as medical records External records from outside source obtained and reviewed including prior oncological treatment of her underlying AML   Lab Tests:  I Ordered, and personally interpreted labs.  The pertinent results include: Sepsis order set, lab tests ordered, lactic acid less than 2, the patient is pancytopenic, this is consistent with prior lab values   Imaging Studies ordered:  I ordered imaging studies including chest x-ray no acute findings I independently visualized and interpreted imaging which showed no acute findings I agree with the radiologist interpretation   Cardiac Monitoring: / EKG:  The patient was maintained on a cardiac monitor.  I personally viewed and interpreted the cardiac monitored which showed an underlying rhythm of: Atrial fibrillation with slight increase in rate between 110 and 120   Consultations Obtained:  I requested consultation with the hospitalist and oncology,  and discussed lab and imaging findings as well as pertinent plan - they recommend: Admit to hospitalist for antibiotics, neutropenic fever and cellulitis   Problem List / ED Course / Critical interventions / Medication management  Antibiotics and IV fluids given, rate control held as this is likely being driven by infection and the patient is not hemodynamically unstable however the patient is being treated for sepsis I ordered medication including antibiotics for infection Reevaluation of the patient after these medicines showed that the patient slightly improved but critically ill with regards to the diagnosis of sepsis I have reviewed the patients home medicines and have made  adjustments as needed   Social Determinants of Health:  Neutropenic secondary to cancer treatment   Test / Admission - Considered:  We will need to be admitted to high level of care         Final Clinical Impression(s) / ED Diagnoses Final diagnoses:  Sepsis, due to unspecified organism, unspecified whether acute organ dysfunction present [facility])     Sabra Heck,  Aaron Edelman, MD 12/07/21 1352

## 2021-12-07 NOTE — Progress Notes (Signed)
CRITICAL VALUE ALERT Critical value received:  WBC 0.1 , ANC 0.0.  Date of notification:  12/07/2021 Time of notification: 11:39 am.  Critical value read back:  Yes.   Nurse who received alert:  B. Kloe Oates Rn MD notified time and response:  [place] @ 11:52 am. Patient sent to the ER by Dr. Delton Coombes.

## 2021-12-08 ENCOUNTER — Inpatient Hospital Stay: Payer: PPO

## 2021-12-08 LAB — CBC
HCT: 17 % — ABNORMAL LOW (ref 36.0–46.0)
Hemoglobin: 5.6 g/dL — CL (ref 12.0–15.0)
MCH: 29.3 pg (ref 26.0–34.0)
MCHC: 32.9 g/dL (ref 30.0–36.0)
MCV: 89 fL (ref 80.0–100.0)
Platelets: 34 10*3/uL — ABNORMAL LOW (ref 150–400)
RBC: 1.91 MIL/uL — ABNORMAL LOW (ref 3.87–5.11)
RDW: 14.9 % (ref 11.5–15.5)
WBC: 0.1 10*3/uL — CL (ref 4.0–10.5)
nRBC: 0 % (ref 0.0–0.2)

## 2021-12-08 LAB — COMPREHENSIVE METABOLIC PANEL
ALT: 8 U/L (ref 0–44)
AST: 13 U/L — ABNORMAL LOW (ref 15–41)
Albumin: 2.1 g/dL — ABNORMAL LOW (ref 3.5–5.0)
Alkaline Phosphatase: 50 U/L (ref 38–126)
Anion gap: 9 (ref 5–15)
BUN: 13 mg/dL (ref 8–23)
CO2: 21 mmol/L — ABNORMAL LOW (ref 22–32)
Calcium: 7.9 mg/dL — ABNORMAL LOW (ref 8.9–10.3)
Chloride: 105 mmol/L (ref 98–111)
Creatinine, Ser: 0.68 mg/dL (ref 0.44–1.00)
GFR, Estimated: >60 mL/min (ref >60)
Glucose, Bld: 115 mg/dL — ABNORMAL HIGH (ref 70–99)
Potassium: 4.1 mmol/L (ref 3.5–5.1)
Sodium: 135 mmol/L (ref 135–145)
Total Bilirubin: 1.9 mg/dL — ABNORMAL HIGH (ref 0.3–1.2)
Total Protein: 5 g/dL — ABNORMAL LOW (ref 6.5–8.1)

## 2021-12-08 LAB — URINE CULTURE: Culture: NO GROWTH

## 2021-12-08 LAB — GLUCOSE, CAPILLARY
Glucose-Capillary: 121 mg/dL — ABNORMAL HIGH (ref 70–99)
Glucose-Capillary: 168 mg/dL — ABNORMAL HIGH (ref 70–99)
Glucose-Capillary: 131 mg/dL — ABNORMAL HIGH (ref 70–99)

## 2021-12-08 LAB — PREPARE RBC (CROSSMATCH)

## 2021-12-08 MED ORDER — CHLORHEXIDINE GLUCONATE CLOTH 2 % EX PADS
6.0000 | MEDICATED_PAD | Freq: Every day | CUTANEOUS | Status: DC
  Administered 2021-12-08 – 2021-12-11 (×4): 6 via TOPICAL

## 2021-12-08 MED ORDER — VENETOCLAX 100 MG PO TABS
100.0000 mg | ORAL_TABLET | Freq: Every day | ORAL | Status: DC
  Administered 2021-12-09: 100 mg via ORAL

## 2021-12-08 MED ORDER — FUROSEMIDE 10 MG/ML IJ SOLN
20.0000 mg | Freq: Once | INTRAMUSCULAR | Status: AC
  Administered 2021-12-08: 20 mg via INTRAVENOUS
  Filled 2021-12-08: qty 2

## 2021-12-08 MED ORDER — SODIUM CHLORIDE 0.9% IV SOLUTION: Freq: Once | INTRAVENOUS | Status: AC

## 2021-12-08 MED ORDER — POSACONAZOLE 100 MG PO TBEC
300.0000 mg | DELAYED_RELEASE_TABLET | Freq: Every day | ORAL | Status: DC
  Administered 2021-12-09 – 2021-12-11 (×3): 300 mg via ORAL
  Filled 2021-12-08 (×6): qty 3

## 2021-12-08 NOTE — Progress Notes (Signed)
Message from Dr. Delton Coombes and attending Dr. Joesph Fillers to Hold venetoclax.

## 2021-12-08 NOTE — Progress Notes (Signed)
Home medication Posaconazole sent to pharmacy by [facility].

## 2021-12-08 NOTE — TOC Initial Note (Signed)
Transition of Care [facility]) - Initial/Assessment Note    Patient Details  Name: Kaitlyn Hull MRN: [id number] Date of Birth: [birth year]  Transition of Care [facility]) CM/SW Contact:    Shade Flood, LCSW Phone Number: 12/08/2021, 12:37 PM  Clinical Narrative:                  Pt admitted from home. She has a high readmission risk score. Spoke with pt's daughter today for initial assessment. Dtr states that pt lives with her sig other. She is independent in ADLs and does not use any DME. Pt drives and is able to get to appointments and obtain medications with no difficulty. Pt does not have any HH services.  MD anticipating pt will remain hospitalized through the weekend. TOC will follow and continue to assess and assist with dc planning.  Expected Discharge Plan: Home/Self Care Barriers to Discharge: Continued Medical Work up   Patient Goals and CMS Choice Patient states their goals for this hospitalization and ongoing recovery are:: go home CMS Medicare.gov Compare Post Acute Care list provided to:: Patient Represenative (must comment) Choice offered to / list presented to : Adult Children  Expected Discharge Plan and Services Expected Discharge Plan: Home/Self Care                                              Prior Living Arrangements/Services   Lives with:: Significant Other Patient language and need for interpreter reviewed:: Yes Do you feel safe going back to the place where you live?: Yes      Need for Family Participation in Patient Care: No (Comment)     Criminal Activity/Legal Involvement Pertinent to Current Situation/Hospitalization: No - Comment as needed  Activities of Daily Living Home Assistive Devices/Equipment: Eyeglasses ADL Screening (condition at time of admission) Patient's cognitive ability adequate to safely complete daily activities?: Yes Is the patient deaf or have difficulty hearing?: No Does the patient have difficulty seeing, even when  wearing glasses/contacts?: Yes Does the patient have difficulty concentrating, remembering, or making decisions?: No Patient able to express need for assistance with ADLs?: Yes Does the patient have difficulty dressing or bathing?: No Independently performs ADLs?: Yes (appropriate for developmental age) Does the patient have difficulty walking or climbing stairs?: No Weakness of Legs: None Weakness of Arms/Hands: None  Permission Sought/Granted                  Emotional Assessment       Orientation: : Oriented to Self, Oriented to Place, Oriented to  Time, Oriented to Situation Alcohol / Substance Use: Not Applicable Psych Involvement: No (comment)  Admission diagnosis:  Neutropenia ([place]) [D70.9] Sepsis, due to unspecified organism, unspecified whether acute organ dysfunction present [facility]) [A41.9] Patient Active Problem List   Diagnosis Date Noted   Neutropenia ([place]) 12/07/2021   Hypomagnesemia 11/30/2021   Pancytopenia due to AML 10/30/2021   Acute myeloid leukemia not having achieved remission ([place])    Acute Myeloid Leukemia/Abnormal blood smear/ 10/27/2021   Acute anemia 10/26/2021   Thrombocytopenia ([facility])    Umbilical bleeding    Rupture of operation wound    Left breast mass    Wound of left breast 08/25/2020   Fecal occult blood test positive 12/17/2017   Screening for colorectal cancer 12/17/2017   Encounter for gynecological examination with Papanicolaou smear of cervix 12/17/2017  Esophageal reflux    Pain in the chest 10/11/2015   Urinary frequency 01/20/2014   Hematuria 01/20/2014   Hyperthyroidism 06/29/2013   Chronic atrial fibrillation ([place]) 06/26/2013   Postmenopausal bleeding 08/20/2012   History of breast cancer 07/24/2012   Diabetes ([place]) 07/24/2012   Hypertension 07/24/2012   Morbid obesity due to excess calories ([place]) 07/24/2012   PMB (postmenopausal bleeding) 07/24/2012   PCP:  [name], MD Pharmacy:   [place] [place] [address] [place] [address] Phone: [phone] Fax: [phone]     Social Determinants of Health (SDOH) Interventions    Readmission Risk Interventions    12/08/2021   12:36 PM  Readmission Risk Prevention Plan  Transportation Screening Complete  Home Care Screening Complete  Medication Review (RN CM) Complete

## 2021-12-08 NOTE — Progress Notes (Signed)
Permission and verbal order obtained from Attending Dr. Joesph Fillers to assess PICC  for continuous IV fluids

## 2021-12-08 NOTE — Progress Notes (Signed)
PROGRESS NOTE     Kaitlyn Hull, is a 66 y.o. female, DOB - 09/09/1955, XBD:[id number]  Admit date - 12/07/2021   Admitting Physician Micheline Markes Denton Brick, MD  Outpatient Primary MD for the patient is [name], MD  LOS - 1  Chief Complaint  Patient presents with   Facial Swelling       Brief Narrative:   66 y.o. female with past medical history relevant for  permanent A-fib , history of breast cancer, diabetes, hypertension, HLD and and morbid obesity recently diagnosed with AML undergoing chemotherapy--- admitted on 12/07/2021 with lysis of left side of her face in the setting of neutropenia/profound pancytopenia secondary to AML and chemotherapy    -Assessment and Plan:  1)Neutropenic fever/left-sided facial cellulitis---- -patient has a left arm PICC line placed about 3 weeks ago--site does not look infected -Left-sided facial cellulitis noted--- symptoms started on 12/05/2021 -Patient with profound leukopenia with ANC of 0.0%, and WBC of 0.1 -Observe protective neutropenic precautions for patient -c/n Cefepime and vancomycin pending blood culture data -Chest x-ray and UA not suggestive of infection -Discussed with Dr. Delton Coombes he advises against Granix at this time -Patient does not meet sepsis criteria   2)Acute Myeloid Leukemia with severe leukopenia, severe anemia and severe thrombocytopenia- -persistent pancytopenia -Continue allopurinol with concerns for tumor lysis syndrome -Continue prophylactic acyclovir -Hold Venetoclax. -Transfuse PRBC and platelets if patient meets threshold -Patient received transfusion of PRBC on 12/06/2021 -Hgb is down to 5.6--- transfused 2 units of PRBC on 12/08/2021   3) chronic atrial fibrillation--- discontinued Eliquis due to severe anemia and severe thrombocytopenia in the setting of AML on chemotherapy -Continue Cardizem CD and metoprolol for rate control   4)DM2-.  A1c 6.2% reflecting excellent diabetic control PTA -hold  metformin Use Novolog/Humalog Sliding scale insulin with Accu-Cheks/Fingersticks as ordered    5)Hypokalemia and  hypomagnesemia--- replace and recheck   6)Morbid Obesity- -Low calorie diet, portion control and increase physical activity discussed with patient -Body mass index is 51.03 kg/m.     Disposition/Need for in-Hospital Stay- patient unable to be discharged at this time due to - -neutropenic fevers/cellulitis requiring IV antibiotics pending culture data and is severely pancytopenic patient   Status is: Inpatient   Remains inpatient appropriate because:    Dispo: The patient is from: Home              Anticipated d/c is to: Home              Anticipated d/c date is: > 3 days              Patient currently is not medically stable to d/c. Barriers: Not Clinically Stable-   Code Status : -  Code Status: Full Code   Family Communication:    NA (patient is alert, awake and coherent)   DVT Prophylaxis  :   - SCDs   SCDs Start: 12/07/21 1717 Place TED hose Start: 12/07/21 1717   Lab Results  Component Value Date   PLT 34 (L) 12/08/2021    Inpatient Medications  Scheduled Meds:  acyclovir  400 mg Oral BID   allopurinol  300 mg Oral Daily   Chlorhexidine Gluconate Cloth  6 each Topical Daily   furosemide  20 mg Intravenous Once   insulin aspart  0-5 Units Subcutaneous QHS   insulin aspart  0-6 Units Subcutaneous TID WC   magnesium oxide  400 mg Oral BID   metoprolol tartrate  100 mg Oral BID  multivitamin with minerals  1 tablet Oral Daily   [START ON 12/09/2021] posaconazole  300 mg Oral Daily   pravastatin  40 mg Oral QHS   sodium chloride flush  3 mL Intravenous Q12H   sodium chloride flush  3 mL Intravenous Q12H   Continuous Infusions:  sodium chloride     ceFEPime (MAXIPIME) IV Stopped (12/08/21 1436)   vancomycin Stopped (12/08/21 1341)   PRN Meds:.sodium chloride, acetaminophen **OR** acetaminophen, ALPRAZolam, bisacodyl, ondansetron **OR** ondansetron  (ZOFRAN) IV, polyethylene glycol, prochlorperazine, sodium chloride flush, traZODone   Anti-infectives (From admission, onward)    Start     Dose/Rate Route Frequency Ordered Stop   12/09/21 1000  posaconazole (NOXAFIL) delayed-release tablet 300 mg        300 mg Oral Daily 12/08/21 1514     12/08/21 1300  vancomycin (VANCOREADY) IVPB 1500 mg/300 mL        1,500 mg 150 mL/hr over 120 Minutes Intravenous Every 24 hours 12/07/21 1233     12/08/21 1000  posaconazole (NOXAFIL) delayed-release tablet 100 mg  Status:  Discontinued        100 mg Oral Daily 12/07/21 1717 12/08/21 1514   12/07/21 2200  ceFEPIme (MAXIPIME) 2 g in sodium chloride 0.9 % 100 mL IVPB        2 g 200 mL/hr over 30 Minutes Intravenous Every 8 hours 12/07/21 1406     12/07/21 2200  acyclovir (ZOVIRAX) tablet 400 mg        400 mg Oral 2 times daily 12/07/21 1717     12/07/21 2200  ceFEPIme (MAXIPIME) 2 g in sodium chloride 0.9 % 100 mL IVPB  Status:  Discontinued        2 g 200 mL/hr over 30 Minutes Intravenous Every 8 hours 12/07/21 1850 12/07/21 1901   12/07/21 1200  ceFEPIme (MAXIPIME) 2 g in sodium chloride 0.9 % 100 mL IVPB        2 g 200 mL/hr over 30 Minutes Intravenous  Once 12/07/21 1150 12/07/21 1336   12/07/21 1200  vancomycin (VANCOREADY) IVPB 2000 mg/400 mL        2,000 mg 200 mL/hr over 120 Minutes Intravenous  Once 12/07/21 1159 12/07/21 1507       Subjective: Kaitlyn Hull today has no fevers, no emesis,  No chest pain,   - No Nausea, Vomiting or Diarrhea No Productive cough -No bleeding concerns  Objective: Vitals:   12/08/21 1505 12/08/21 1624 12/08/21 1648 12/08/21 1745  BP: 114/67 111/76  (!) 112/99  Pulse: (!) 109 (!) 113 (!) 109 (!) 109  Resp: '19 20  20  '$ Temp: 98.4 F (36.9 C) 98.5 F (36.9 C)  98.4 F (36.9 C)  TempSrc: Oral Oral  Oral  SpO2: 100% 100%  100%  Weight:      Height:        Intake/Output Summary (Last 24 hours) at 12/08/2021 1801 Last data filed at 12/08/2021  1528 Gross per 24 hour  Intake 1933.57 ml  Output --  Net 1933.57 ml   Filed Weights   12/07/21 1136 12/07/21 1700  Weight: 126.6 kg 122 kg   Physical Exam  Gen:- Awake Alert,  in no apparent distress  HEENT:- [place].AT, No sclera icterus Neck-Supple Neck,No JVD,.  Lungs-  CTAB , fair symmetrical air movement CV- S1, S2 normal, regular  Abd-  +ve B.Sounds, Abd Soft, No tenderness, increased truncal adiposity    Psych-affect is appropriate, oriented x3 Neuro-no new focal deficits, no tremors -MSK-  left arm PICC line site is clean dry and intact Skin -left-sided facial and neck erythema as below  Data Reviewed: I have personally reviewed following labs and imaging studies  CBC: Recent Labs  Lab 12/02/21 0715 12/04/21 1129 12/07/21 1100 12/08/21 0551  WBC 0.3* 0.2* 0.1* 0.1*  NEUTROABS 0.0* 0.0* 0.0*  --   HGB 8.1* 7.5* 8.0* 5.6*  HCT 24.4* 22.9* 23.5* 17.0*  MCV 89.4 90.5 88.3 89.0  PLT 15* 24* 30* 34*   Basic Metabolic Panel: Recent Labs  Lab 12/02/21 0715 12/04/21 1128 12/07/21 1100 12/08/21 0551  NA 136 136 134* 135  K 3.4* 3.4* 3.4* 4.1  CL 101 105 102 105  CO2 '24 23 22 '$ 21*  GLUCOSE 125* 118* 134* 115*  BUN '14 18 15 13  '$ CREATININE 1.12* 1.03* 0.93 0.68  CALCIUM 8.4* 8.6* 8.6* 7.9*  MG  --  1.5* 1.5*  --    GFR: Estimated Creatinine Clearance: 88.5 mL/min (by C-G formula based on SCr of 0.68 mg/dL). Liver Function Tests: Recent Labs  Lab 12/02/21 0715 12/04/21 1128 12/07/21 1100 12/08/21 0551  AST '20 18 15 '$ 13*  ALT '12 10 9 8  '$ ALKPHOS 64 59 64 50  BILITOT 1.9* 1.6* 2.7* 1.9*  PROT 6.8 6.7 6.9 5.0*  ALBUMIN 3.3* 3.2* 3.1* 2.1*   Recent Results (from the past 240 hour(s))  C difficile quick screen w PCR reflex     Status: None   Collection Time: 12/01/21  9:49 AM   Specimen: STOOL  Result Value Ref Range Status   C Diff antigen NEGATIVE NEGATIVE Final   C Diff toxin NEGATIVE NEGATIVE Final   C Diff interpretation No C. difficile detected.   Final    Comment: Performed at [facility], [address] [place], [place] [address]  Resp Panel by RT-PCR (Flu A&B, Covid) Anterior Nasal Swab     Status: None   Collection Time: 12/07/21 11:50 AM   Specimen: Anterior Nasal Swab  Result Value Ref Range Status   SARS Coronavirus 2 by RT PCR NEGATIVE NEGATIVE Final    Comment: (NOTE) SARS-CoV-2 target nucleic acids are NOT DETECTED.  The SARS-CoV-2 RNA is generally detectable in upper respiratory specimens during the acute phase of infection. The lowest concentration of SARS-CoV-2 viral copies this assay can detect is 138 copies/mL. A negative result does not preclude SARS-Cov-2 infection and should not be used as the sole basis for treatment or other patient management decisions. A negative result may occur with  improper specimen collection/handling, submission of specimen other than nasopharyngeal swab, presence of viral mutation(s) within the areas targeted by this assay, and inadequate number of viral copies(<138 copies/mL). A negative result must be combined with clinical observations, patient history, and epidemiological information. The expected result is Negative.  Fact Sheet for Patients:  EntrepreneurPulse.com.au  Fact Sheet for Healthcare Providers:  IncredibleEmployment.be  This test is no t yet approved or cleared by the [place] FDA and  has been authorized for detection and/or diagnosis of SARS-CoV-2 by FDA under an Emergency Use Authorization (EUA). This EUA will remain  in effect (meaning this test can be used) for the duration of the COVID-19 declaration under Section 564(b)(1) of the Act, 21 U.S.C.section 360bbb-3(b)(1), unless the authorization is terminated  or revoked sooner.       Influenza A by PCR NEGATIVE NEGATIVE Final   Influenza B by PCR NEGATIVE NEGATIVE Final    Comment: (NOTE) The Xpert Xpress SARS-CoV-2/FLU/RSV plus assay is intended as  an aid in the  diagnosis of influenza from Nasopharyngeal swab specimens and should not be used as a sole basis for treatment. Nasal washings and aspirates are unacceptable for Xpert Xpress SARS-CoV-2/FLU/RSV testing.  Fact Sheet for Patients: EntrepreneurPulse.com.au  Fact Sheet for Healthcare Providers: IncredibleEmployment.be  This test is not yet approved or cleared by the [place] FDA and has been authorized for detection and/or diagnosis of SARS-CoV-2 by FDA under an Emergency Use Authorization (EUA). This EUA will remain in effect (meaning this test can be used) for the duration of the COVID-19 declaration under Section 564(b)(1) of the Act, 21 U.S.C. section 360bbb-3(b)(1), unless the authorization is terminated or revoked.  Performed at [facility], [address] [place], [place] [address]   Blood Culture (routine x 2)     Status: None (Preliminary result)   Collection Time: 12/07/21 12:38 PM   Specimen: BLOOD  Result Value Ref Range Status   Specimen Description BLOOD LEFT HAND  Final   Special Requests   Final    BOTTLES DRAWN AEROBIC AND ANAEROBIC Blood Culture adequate volume   Culture   Final    NO GROWTH < 24 HOURS Performed at [facility], [address] [place], [place] [address]    Report Status PENDING  Incomplete  Blood Culture (routine x 2)     Status: None (Preliminary result)   Collection Time: 12/07/21 12:38 PM   Specimen: BLOOD  Result Value Ref Range Status   Specimen Description BLOOD RIGHT ARM  Final   Special Requests   Final    BOTTLES DRAWN AEROBIC AND ANAEROBIC Blood Culture adequate volume   Culture   Final    NO GROWTH < 24 HOURS Performed at [facility], [address], [place], [place] [address]    Report Status PENDING  Incomplete  Culture, blood (Routine X 2) w Reflex to ID Panel     Status: None (Preliminary result)   Collection Time: 12/07/21  8:58 PM   Specimen: BLOOD RIGHT HAND  Result Value Ref  Range Status   Specimen Description BLOOD RIGHT HAND  Final   Special Requests   Final    BOTTLES DRAWN AEROBIC AND ANAEROBIC Blood Culture adequate volume   Culture   Final    NO GROWTH < 12 HOURS Performed at [facility], [address], [place], [place] [address]    Report Status PENDING  Incomplete  Culture, blood (Routine X 2) w Reflex to ID Panel     Status: None (Preliminary result)   Collection Time: 12/07/21  8:58 PM   Specimen: BLOOD LEFT HAND  Result Value Ref Range Status   Specimen Description BLOOD LEFT HAND  Final   Special Requests   Final    BOTTLES DRAWN AEROBIC AND ANAEROBIC Blood Culture adequate volume   Culture   Final    NO GROWTH < 12 HOURS Performed at [facility], [address] [place], [place] [address]    Report Status PENDING  Incomplete    Radiology Studies: DG Chest Port 1 View  Result Date: 12/07/2021 CLINICAL DATA:  Questionable sepsis - evaluate for abnormality EXAM: PORTABLE CHEST 1 VIEW COMPARISON:  12/02/2021 FINDINGS: Mild cardiomegaly. Slightly low lung volumes. No focal airspace consolidation, pleural effusion, or pneumothorax. IMPRESSION: No active disease. Electronically Signed   By: Davina Poke D.O.   On: 12/07/2021 12:44    Scheduled Meds:  acyclovir  400 mg Oral BID   allopurinol  300 mg Oral Daily   Chlorhexidine  Gluconate Cloth  6 each Topical Daily   furosemide  20 mg Intravenous Once   insulin aspart  0-5 Units Subcutaneous QHS   insulin aspart  0-6 Units Subcutaneous TID WC   magnesium oxide  400 mg Oral BID   metoprolol tartrate  100 mg Oral BID   multivitamin with minerals  1 tablet Oral Daily   [START ON 12/09/2021] posaconazole  300 mg Oral Daily   pravastatin  40 mg Oral QHS   sodium chloride flush  3 mL Intravenous Q12H   sodium chloride flush  3 mL Intravenous Q12H   Continuous Infusions:  sodium chloride     ceFEPime (MAXIPIME) IV Stopped (12/08/21 1436)   vancomycin Stopped (12/08/21 1341)     LOS: 1 day    Roxan Hockey M.D on 12/08/2021 at 6:01 PM  Go to [email or web address] - for contact info  [facility] - Office  [phone]  If 7PM-7AM, please contact night-coverage [email or web address] 12/08/2021, 6:01 PM

## 2021-12-08 NOTE — Progress Notes (Signed)
Patients 15 minute post blood transfusion vital signs: T-98.1, P-115, R-18, BP-109/73, O2-100% Room air. Patient reported no s/s of blood transfusion reaction. MD Courage made aware of patients vital signs.

## 2021-12-09 LAB — TYPE AND SCREEN
ABO/RH(D): A POS
Antibody Screen: NEGATIVE
Unit division: 0
Unit division: 0

## 2021-12-09 LAB — COMPREHENSIVE METABOLIC PANEL
ALT: 15 U/L (ref 0–44)
AST: 27 U/L (ref 15–41)
Albumin: 2.6 g/dL — ABNORMAL LOW (ref 3.5–5.0)
Alkaline Phosphatase: 78 U/L (ref 38–126)
Anion gap: 7 (ref 5–15)
BUN: 15 mg/dL (ref 8–23)
CO2: 24 mmol/L (ref 22–32)
Calcium: 8.2 mg/dL — ABNORMAL LOW (ref 8.9–10.3)
Chloride: 103 mmol/L (ref 98–111)
Creatinine, Ser: 0.81 mg/dL (ref 0.44–1.00)
GFR, Estimated: >60 mL/min (ref >60)
Glucose, Bld: 127 mg/dL — ABNORMAL HIGH (ref 70–99)
Potassium: 3.6 mmol/L (ref 3.5–5.1)
Sodium: 134 mmol/L — ABNORMAL LOW (ref 135–145)
Total Bilirubin: 3.7 mg/dL — ABNORMAL HIGH (ref 0.3–1.2)
Total Protein: 6.1 g/dL — ABNORMAL LOW (ref 6.5–8.1)

## 2021-12-09 LAB — BPAM RBC
Blood Product Expiration Date: 202310212359
Blood Product Expiration Date: 202310212359
ISSUE DATE / TIME: 202310061751
ISSUE DATE / TIME: 202310062105
Unit Type and Rh: 6200
Unit Type and Rh: 6200

## 2021-12-09 LAB — GLUCOSE, CAPILLARY
Glucose-Capillary: 131 mg/dL — ABNORMAL HIGH (ref 70–99)
Glucose-Capillary: 124 mg/dL — ABNORMAL HIGH (ref 70–99)
Glucose-Capillary: 144 mg/dL — ABNORMAL HIGH (ref 70–99)
Glucose-Capillary: 134 mg/dL — ABNORMAL HIGH (ref 70–99)
Glucose-Capillary: 133 mg/dL — ABNORMAL HIGH (ref 70–99)

## 2021-12-09 LAB — PHOSPHORUS: Phosphorus: 2.7 mg/dL (ref 2.5–4.6)

## 2021-12-09 LAB — MAGNESIUM: Magnesium: 1.5 mg/dL — ABNORMAL LOW (ref 1.7–2.4)

## 2021-12-09 MED ORDER — POTASSIUM CHLORIDE CRYS ER 20 MEQ PO TBCR
40.0000 meq | EXTENDED_RELEASE_TABLET | Freq: Once | ORAL | Status: AC
  Administered 2021-12-09: 40 meq via ORAL
  Filled 2021-12-09: qty 2

## 2021-12-09 MED ORDER — MAGNESIUM SULFATE 4 GM/100ML IV SOLN
4.0000 g | Freq: Once | INTRAVENOUS | Status: AC
  Administered 2021-12-09: 4 g via INTRAVENOUS
  Filled 2021-12-09: qty 100

## 2021-12-09 NOTE — Plan of Care (Signed)
  Problem: Clinical Measurements: Goal: Will remain free from infection Outcome: Progressing   Problem: Clinical Measurements: Goal: Diagnostic test results will improve Outcome: Progressing   Problem: Clinical Measurements: Goal: Cardiovascular complication will be avoided Outcome: Progressing   

## 2021-12-09 NOTE — Progress Notes (Signed)
PROGRESS NOTE     Kaitlyn Hull, is a 66 y.o. female, DOB - 1955-10-24, TIR:[id number]  Admit date - 12/07/2021   Admitting Physician Lary Eckardt Denton Brick, MD  Outpatient Primary MD for the patient is [name], MD  LOS - 2  Chief Complaint  Patient presents with   Facial Swelling       Brief Narrative:   66 y.o. female with past medical history relevant for  permanent A-fib , history of breast cancer, diabetes, hypertension, HLD and and morbid obesity recently diagnosed with AML undergoing chemotherapy--- admitted on 12/07/2021 with Cellulitis of left side of her face in the setting of neutropenia/profound pancytopenia secondary to AML and chemotherapy    -Assessment and Plan:  1)Neutropenic fever/left-sided facial cellulitis---- -patient has a left arm PICC line placed about 3 weeks ago--site does not look infected -Left-sided facial cellulitis improving very slowly--- symptoms started on 12/05/2021 -Patient with profound leukopenia with ANC of 0.0%, and WBC up to 0.2 -Observe protective neutropenic precautions for patient -c/n Cefepime and vancomycin pending blood culture data -Chest x-ray and UA not suggestive of infection -Discussed with Dr. Delton Coombes he advises against Granix at this time -Patient does not meet sepsis criteria -Blood cultures NGTD   2)Acute Myeloid Leukemia with severe leukopenia, severe anemia and severe thrombocytopenia- -persistent pancytopenia -Continue allopurinol with concerns for tumor lysis syndrome -Continue prophylactic acyclovir -Hold Venetoclax as per Dr. Delton Coombes -Patient received transfusion of PRBC on 12/06/2021 -Hgb is up to 8.3 from 5.6---after 2 units of PRBC on 12/08/2021 -Platelets trending up--- no bleeding concerns   3) chronic atrial fibrillation--- discontinued Eliquis due to severe anemia and severe thrombocytopenia in the setting of AML on chemotherapy -Continue Cardizem CD and metoprolol for rate control   4)DM2-.  A1c  6.2% reflecting excellent diabetic control PTA -hold metformin Use Novolog/Humalog Sliding scale insulin with Accu-Cheks/Fingersticks as ordered    5)Hypokalemia and  hypomagnesemia--- replace and recheck   6)Morbid Obesity- -Low calorie diet, portion control and increase physical activity discussed with patient -Body mass index is 51.03 kg/m.     Disposition/Need for in-Hospital Stay- patient unable to be discharged at this time due to - -neutropenic fevers/cellulitis requiring IV antibiotics pending culture data and is severely pancytopenic patient   Status is: Inpatient   Remains inpatient appropriate because:    Dispo: The patient is from: Home              Anticipated d/c is to: Home              Anticipated d/c date is: 2 days              Patient currently is not medically stable to d/c. Barriers: Not Clinically Stable-   Code Status : -  Code Status: Full Code   Family Communication:    NA (patient is alert, awake and coherent)   DVT Prophylaxis  :   - SCDs   SCDs Start: 12/07/21 1717 Place TED hose Start: 12/07/21 1717   Lab Results  Component Value Date   PLT 61 (L) 12/09/2021    Inpatient Medications  Scheduled Meds:  acyclovir  400 mg Oral BID   allopurinol  300 mg Oral Daily   Chlorhexidine Gluconate Cloth  6 each Topical Daily   insulin aspart  0-5 Units Subcutaneous QHS   insulin aspart  0-6 Units Subcutaneous TID WC   magnesium oxide  400 mg Oral BID   metoprolol tartrate  100 mg Oral BID  multivitamin with minerals  1 tablet Oral Daily   posaconazole  300 mg Oral Daily   pravastatin  40 mg Oral QHS   sodium chloride flush  3 mL Intravenous Q12H   sodium chloride flush  3 mL Intravenous Q12H   Continuous Infusions:  sodium chloride     ceFEPime (MAXIPIME) IV Stopped (12/09/21 7867)   vancomycin 1,500 mg (12/09/21 1256)   PRN Meds:.sodium chloride, acetaminophen **OR** acetaminophen, ALPRAZolam, bisacodyl, ondansetron **OR** ondansetron  (ZOFRAN) IV, polyethylene glycol, prochlorperazine, sodium chloride flush, traZODone   Anti-infectives (From admission, onward)    Start     Dose/Rate Route Frequency Ordered Stop   12/09/21 1000  posaconazole (NOXAFIL) delayed-release tablet 300 mg        300 mg Oral Daily 12/08/21 1514     12/08/21 1300  vancomycin (VANCOREADY) IVPB 1500 mg/300 mL        1,500 mg 150 mL/hr over 120 Minutes Intravenous Every 24 hours 12/07/21 1233     12/08/21 1000  posaconazole (NOXAFIL) delayed-release tablet 100 mg  Status:  Discontinued        100 mg Oral Daily 12/07/21 1717 12/08/21 1514   12/07/21 2200  ceFEPIme (MAXIPIME) 2 g in sodium chloride 0.9 % 100 mL IVPB        2 g 200 mL/hr over 30 Minutes Intravenous Every 8 hours 12/07/21 1406     12/07/21 2200  acyclovir (ZOVIRAX) tablet 400 mg        400 mg Oral 2 times daily 12/07/21 1717     12/07/21 2200  ceFEPIme (MAXIPIME) 2 g in sodium chloride 0.9 % 100 mL IVPB  Status:  Discontinued        2 g 200 mL/hr over 30 Minutes Intravenous Every 8 hours 12/07/21 1850 12/07/21 1901   12/07/21 1200  ceFEPIme (MAXIPIME) 2 g in sodium chloride 0.9 % 100 mL IVPB        2 g 200 mL/hr over 30 Minutes Intravenous  Once 12/07/21 1150 12/07/21 1336   12/07/21 1200  vancomycin (VANCOREADY) IVPB 2000 mg/400 mL        2,000 mg 200 mL/hr over 120 Minutes Intravenous  Once 12/07/21 1159 12/07/21 1507       Subjective: Kaitlyn Hull today has no fevers, no emesis,  No chest pain,   - --Fatigue and malaise persist -  Objective: Vitals:   12/08/21 2346 12/09/21 0228 12/09/21 0532 12/09/21 0922  BP: (!) 142/99 123/78 121/84 (!) 137/91  Pulse: (!) 114 (!) 109 (!) 120 (!) 120  Resp: '18 18 14 18  '$ Temp: 97.9 F (36.6 C) 98.2 F (36.8 C) 97.8 F (36.6 C) 98.2 F (36.8 C)  TempSrc: Oral Oral  Oral  SpO2: 98% 99% 100% 100%  Weight:      Height:        Intake/Output Summary (Last 24 hours) at 12/09/2021 1436 Last data filed at 12/09/2021 0645 Gross per 24  hour  Intake 1876.46 ml  Output --  Net 1876.46 ml   Filed Weights   12/07/21 1136 12/07/21 1700  Weight: 126.6 kg 122 kg   Physical Exam  Gen:- Awake Alert,  in no apparent distress  HEENT:- [place].AT, No sclera icterus Neck-Supple Neck,No JVD,.  Lungs-  CTAB , fair symmetrical air movement CV- S1, S2 normal, regular  Abd-  +ve B.Sounds, Abd Soft, No tenderness, increased truncal adiposity    Psych-affect is appropriate, oriented x3 Neuro-no new focal deficits, no tremors -MSK- left arm PICC line  site is clean dry and intact Skin -left-sided facial and upper neck cellulitis is improving very slowly  Data Reviewed: I have personally reviewed following labs and imaging studies  CBC: Recent Labs  Lab 12/04/21 1129 12/07/21 1100 12/08/21 0551 12/09/21 0600  WBC 0.2* 0.1* 0.1* 0.2*  NEUTROABS 0.0* 0.0*  --   --   HGB 7.5* 8.0* 5.6* 8.3*  HCT 22.9* 23.5* 17.0* 24.2*  MCV 90.5 88.3 89.0 85.8  PLT 24* 30* 34* 61*   Basic Metabolic Panel: Recent Labs  Lab 12/04/21 1128 12/07/21 1100 12/08/21 0551 12/09/21 0600  NA 136 134* 135 134*  K 3.4* 3.4* 4.1 3.6  CL 105 102 105 103  CO2 23 22 21* 24  GLUCOSE 118* 134* 115* 127*  BUN '18 15 13 15  '$ CREATININE 1.03* 0.93 0.68 0.81  CALCIUM 8.6* 8.6* 7.9* 8.2*  MG 1.5* 1.5*  --  1.5*  PHOS  --   --   --  2.7   GFR: Estimated Creatinine Clearance: 87.4 mL/min (by C-G formula based on SCr of 0.81 mg/dL). Liver Function Tests: Recent Labs  Lab 12/04/21 1128 12/07/21 1100 12/08/21 0551 12/09/21 0600  AST 18 15 13* 27  ALT '10 9 8 15  '$ ALKPHOS 59 64 50 78  BILITOT 1.6* 2.7* 1.9* 3.7*  PROT 6.7 6.9 5.0* 6.1*  ALBUMIN 3.2* 3.1* 2.1* 2.6*   Recent Results (from the past 240 hour(s))  C difficile quick screen w PCR reflex     Status: None   Collection Time: 12/01/21  9:49 AM   Specimen: STOOL  Result Value Ref Range Status   C Diff antigen NEGATIVE NEGATIVE Final   C Diff toxin NEGATIVE NEGATIVE Final   C Diff interpretation  No C. difficile detected.  Final    Comment: Performed at [facility], [address], [place], [place] [address]  Resp Panel by RT-PCR (Flu A&B, Covid) Anterior Nasal Swab     Status: None   Collection Time: 12/07/21 11:50 AM   Specimen: Anterior Nasal Swab  Result Value Ref Range Status   SARS Coronavirus 2 by RT PCR NEGATIVE NEGATIVE Final    Comment: (NOTE) SARS-CoV-2 target nucleic acids are NOT DETECTED.  The SARS-CoV-2 RNA is generally detectable in upper respiratory specimens during the acute phase of infection. The lowest concentration of SARS-CoV-2 viral copies this assay can detect is 138 copies/mL. A negative result does not preclude SARS-Cov-2 infection and should not be used as the sole basis for treatment or other patient management decisions. A negative result may occur with  improper specimen collection/handling, submission of specimen other than nasopharyngeal swab, presence of viral mutation(s) within the areas targeted by this assay, and inadequate number of viral copies(<138 copies/mL). A negative result must be combined with clinical observations, patient history, and epidemiological information. The expected result is Negative.  Fact Sheet for Patients:  EntrepreneurPulse.com.au  Fact Sheet for Healthcare Providers:  IncredibleEmployment.be  This test is no t yet approved or cleared by the [place] FDA and  has been authorized for detection and/or diagnosis of SARS-CoV-2 by FDA under an Emergency Use Authorization (EUA). This EUA will remain  in effect (meaning this test can be used) for the duration of the COVID-19 declaration under Section 564(b)(1) of the Act, 21 U.S.C.section 360bbb-3(b)(1), unless the authorization is terminated  or revoked sooner.       Influenza A by PCR NEGATIVE NEGATIVE Final   Influenza B by PCR NEGATIVE NEGATIVE Final  Comment: (NOTE) The Xpert Xpress SARS-CoV-2/FLU/RSV plus assay is  intended as an aid in the diagnosis of influenza from Nasopharyngeal swab specimens and should not be used as a sole basis for treatment. Nasal washings and aspirates are unacceptable for Xpert Xpress SARS-CoV-2/FLU/RSV testing.  Fact Sheet for Patients: EntrepreneurPulse.com.au  Fact Sheet for Healthcare Providers: IncredibleEmployment.be  This test is not yet approved or cleared by the [place] FDA and has been authorized for detection and/or diagnosis of SARS-CoV-2 by FDA under an Emergency Use Authorization (EUA). This EUA will remain in effect (meaning this test can be used) for the duration of the COVID-19 declaration under Section 564(b)(1) of the Act, 21 U.S.C. section 360bbb-3(b)(1), unless the authorization is terminated or revoked.  Performed at [facility], [address], [place], [place] [address]   Blood Culture (routine x 2)     Status: None (Preliminary result)   Collection Time: 12/07/21 12:38 PM   Specimen: BLOOD  Result Value Ref Range Status   Specimen Description BLOOD LEFT HAND  Final   Special Requests   Final    BOTTLES DRAWN AEROBIC AND ANAEROBIC Blood Culture adequate volume   Culture   Final    NO GROWTH 2 DAYS Performed at [facility], [address] [place], [place] [address]    Report Status PENDING  Incomplete  Blood Culture (routine x 2)     Status: None (Preliminary result)   Collection Time: 12/07/21 12:38 PM   Specimen: BLOOD  Result Value Ref Range Status   Specimen Description BLOOD RIGHT ARM  Final   Special Requests   Final    BOTTLES DRAWN AEROBIC AND ANAEROBIC Blood Culture adequate volume   Culture   Final    NO GROWTH 2 DAYS Performed at [facility], [address] [place], [place] [address]    Report Status PENDING  Incomplete  Urine Culture     Status: None   Collection Time: 12/07/21  5:15 PM   Specimen: Urine, Clean Catch  Result Value Ref Range Status   Specimen Description    Final    URINE, CLEAN CATCH Performed at [facility], [address] [place], [place] [address]    Special Requests   Final    NONE Performed at [facility], [address], [place], [place] [address]    Culture   Final    NO GROWTH Performed at [facility], [place] [address], [place], [place] [address]    Report Status 12/08/2021 FINAL  Final  Culture, blood (Routine X 2) w Reflex to ID Panel     Status: None (Preliminary result)   Collection Time: 12/07/21  8:58 PM   Specimen: BLOOD RIGHT HAND  Result Value Ref Range Status   Specimen Description BLOOD RIGHT HAND  Final   Special Requests   Final    BOTTLES DRAWN AEROBIC AND ANAEROBIC Blood Culture adequate volume   Culture   Final    NO GROWTH 2 DAYS Performed at [facility], [address] [place], [place] [address]    Report Status PENDING  Incomplete  Culture, blood (Routine X 2) w Reflex to ID Panel     Status: None (Preliminary result)   Collection Time: 12/07/21  8:58 PM   Specimen: BLOOD LEFT HAND  Result Value Ref Range Status   Specimen Description BLOOD LEFT HAND  Final   Special Requests   Final    BOTTLES DRAWN AEROBIC AND ANAEROBIC Blood Culture adequate volume  Culture   Final    NO GROWTH 2 DAYS Performed at [facility], [address] [place], [place] [address]    Report Status PENDING  Incomplete    Radiology Studies: No results found.  Scheduled Meds:  acyclovir  400 mg Oral BID   allopurinol  300 mg Oral Daily   Chlorhexidine Gluconate Cloth  6 each Topical Daily   insulin aspart  0-5 Units Subcutaneous QHS   insulin aspart  0-6 Units Subcutaneous TID WC   magnesium oxide  400 mg Oral BID   metoprolol tartrate  100 mg Oral BID   multivitamin with minerals  1 tablet Oral Daily   posaconazole  300 mg Oral Daily   pravastatin  40 mg Oral QHS   sodium chloride flush  3 mL Intravenous Q12H   sodium chloride flush  3 mL Intravenous Q12H   Continuous Infusions:  sodium chloride      ceFEPime (MAXIPIME) IV Stopped (12/09/21 1700)   vancomycin 1,500 mg (12/09/21 1256)     LOS: 2 days   Roxan Hockey M.D on 12/09/2021 at 2:36 PM  Go to [email or web address] - for contact info  [facility] - Office  [phone]  If 7PM-7AM, please contact night-coverage [email or web address] 12/09/2021, 2:36 PM

## 2021-12-10 LAB — CBC WITH DIFFERENTIAL/PLATELET
Abs Immature Granulocytes: 0 10*3/uL (ref 0.00–0.07)
Basophils Absolute: 0 10*3/uL (ref 0.0–0.1)
Basophils Relative: 0 %
Eosinophils Absolute: 0 10*3/uL (ref 0.0–0.5)
Eosinophils Relative: 0 %
HCT: 24.1 % — ABNORMAL LOW (ref 36.0–46.0)
Hemoglobin: 8.2 g/dL — ABNORMAL LOW (ref 12.0–15.0)
Immature Granulocytes: 0 %
Lymphocytes Relative: 91 %
Lymphs Abs: 0.1 10*3/uL — ABNORMAL LOW (ref 0.7–4.0)
MCH: 29.4 pg (ref 26.0–34.0)
MCHC: 34 g/dL (ref 30.0–36.0)
MCV: 86.4 fL (ref 80.0–100.0)
Monocytes Absolute: 0 10*3/uL — ABNORMAL LOW (ref 0.1–1.0)
Monocytes Relative: 0 %
Neutro Abs: 0 10*3/uL — CL (ref 1.7–7.7)
Neutrophils Relative %: 9 %
Platelets: 110 10*3/uL — ABNORMAL LOW (ref 150–400)
RBC: 2.79 MIL/uL — ABNORMAL LOW (ref 3.87–5.11)
RDW: 16.2 % — ABNORMAL HIGH (ref 11.5–15.5)
WBC Morphology: REACTIVE
WBC: 0.1 10*3/uL — CL (ref 4.0–10.5)
nRBC: 0 % (ref 0.0–0.2)

## 2021-12-10 LAB — BASIC METABOLIC PANEL
Anion gap: 6 (ref 5–15)
BUN: 15 mg/dL (ref 8–23)
CO2: 22 mmol/L (ref 22–32)
Calcium: 8 mg/dL — ABNORMAL LOW (ref 8.9–10.3)
Chloride: 107 mmol/L (ref 98–111)
Creatinine, Ser: 0.76 mg/dL (ref 0.44–1.00)
GFR, Estimated: >60 mL/min (ref >60)
Glucose, Bld: 123 mg/dL — ABNORMAL HIGH (ref 70–99)
Potassium: 3.6 mmol/L (ref 3.5–5.1)
Sodium: 135 mmol/L (ref 135–145)

## 2021-12-10 LAB — GLUCOSE, CAPILLARY
Glucose-Capillary: 106 mg/dL — ABNORMAL HIGH (ref 70–99)
Glucose-Capillary: 125 mg/dL — ABNORMAL HIGH (ref 70–99)
Glucose-Capillary: 144 mg/dL — ABNORMAL HIGH (ref 70–99)

## 2021-12-10 LAB — VANCOMYCIN, TROUGH: Vancomycin Tr: 14 ug/mL — ABNORMAL LOW (ref 15–20)

## 2021-12-10 MED ORDER — VANCOMYCIN HCL 1750 MG/350ML IV SOLN
1750.0000 mg | INTRAVENOUS | Status: DC
  Administered 2021-12-11: 1750 mg via INTRAVENOUS
  Filled 2021-12-10 (×2): qty 350

## 2021-12-10 NOTE — Progress Notes (Signed)
PROGRESS NOTE     Kaitlyn Hull, is a 66 y.o. female, DOB - 26-Feb-1956, JHE:[id number]  Admit date - 12/07/2021   Admitting Physician Dorissa Stinnette Denton Brick, MD  Outpatient Primary MD for the patient is [name], MD  LOS - 3  Chief Complaint  Patient presents with   Facial Swelling       Brief Narrative:   66 y.o. female with past medical history relevant for  permanent A-fib , history of breast cancer, diabetes, hypertension, HLD and and morbid obesity recently diagnosed with AML undergoing chemotherapy--- admitted on 12/07/2021 with Cellulitis of left side of her face in the setting of neutropenia/profound pancytopenia secondary to AML and chemotherapy    -Assessment and Plan:  1)Neutropenic fever/left-sided facial cellulitis---- -patient has a left arm PICC line placed about 3 weeks ago--site does not look infected -Left-sided facial cellulitis improving very slowly--- symptoms started on 12/05/2021 -Patient with profound leukopenia with ANC of 0.0%,  -Observe protective neutropenic precautions for patient -c/n Cefepime and vancomycin pending blood culture data -Chest x-ray and UA not suggestive of infection -Discussed with Dr. Delton Coombes he advises against Granix at this time -Patient does not meet sepsis criteria -Blood cultures NGTD -Left-sided facial cellulitis improving slowly -Possible discharge home on 12/11/2021 if cultures remains negative and patient CBC indices improves   2)Acute Myeloid Leukemia with severe leukopenia, severe anemia and severe thrombocytopenia- -persistent pancytopenia -Continue allopurinol with concerns for tumor lysis syndrome -Continue prophylactic acyclovir -Hold Venetoclax as per Dr. Delton Coombes -Patient received transfusion of PRBC on 12/06/2021 -Hgb is above 8  from 5.6---after 2 units of PRBC on 12/08/2021 -Platelets trending up--- no bleeding concerns -Severe leukopenia persists   3) chronic atrial fibrillation--- discontinued Eliquis  due to severe anemia and severe thrombocytopenia in the setting of AML on chemotherapy -Continue Cardizem CD and metoprolol for rate control   4)DM2-.  A1c 6.2% reflecting excellent diabetic control PTA -hold metformin Use Novolog/Humalog Sliding scale insulin with Accu-Cheks/Fingersticks as ordered    5)Hypokalemia and  hypomagnesemia--- normalized after replacement  6)Morbid Obesity- -Low calorie diet, portion control and increase physical activity discussed with patient -Body mass index is 51.03 kg/m.     Disposition/Need for in-Hospital Stay- patient unable to be discharged at this time due to - -neutropenic fevers/cellulitis requiring IV antibiotics pending culture data and is severely pancytopenic patient -Possible discharge home on 12/11/2021 if cultures remains negative and patient CBC indices improves   Status is: Inpatient   Remains inpatient appropriate because:    Dispo: The patient is from: Home              Anticipated d/c is to: Home              Anticipated d/c date is: 1 days              Patient currently is not medically stable to d/c. Barriers: Not Clinically Stable-   Code Status : -  Code Status: Full Code   Family Communication:    NA (patient is alert, awake and coherent)   DVT Prophylaxis  :   - SCDs   SCDs Start: 12/07/21 1717 Place TED hose Start: 12/07/21 1717   Lab Results  Component Value Date   PLT 110 (L) 12/10/2021    Inpatient Medications  Scheduled Meds:  acyclovir  400 mg Oral BID   allopurinol  300 mg Oral Daily   Chlorhexidine Gluconate Cloth  6 each Topical Daily   insulin aspart  0-5 Units Subcutaneous  QHS   insulin aspart  0-6 Units Subcutaneous TID WC   magnesium oxide  400 mg Oral BID   metoprolol tartrate  100 mg Oral BID   multivitamin with minerals  1 tablet Oral Daily   posaconazole  300 mg Oral Daily   pravastatin  40 mg Oral QHS   sodium chloride flush  3 mL Intravenous Q12H   sodium chloride flush  3 mL  Intravenous Q12H   Continuous Infusions:  sodium chloride     ceFEPime (MAXIPIME) IV Stopped (12/10/21 0617)   vancomycin Stopped (12/09/21 1456)   PRN Meds:.sodium chloride, acetaminophen **OR** acetaminophen, ALPRAZolam, bisacodyl, ondansetron **OR** ondansetron (ZOFRAN) IV, polyethylene glycol, prochlorperazine, sodium chloride flush, traZODone   Anti-infectives (From admission, onward)    Start     Dose/Rate Route Frequency Ordered Stop   12/09/21 1000  posaconazole (NOXAFIL) delayed-release tablet 300 mg        300 mg Oral Daily 12/08/21 1514     12/08/21 1300  vancomycin (VANCOREADY) IVPB 1500 mg/300 mL        1,500 mg 150 mL/hr over 120 Minutes Intravenous Every 24 hours 12/07/21 1233     12/08/21 1000  posaconazole (NOXAFIL) delayed-release tablet 100 mg  Status:  Discontinued        100 mg Oral Daily 12/07/21 1717 12/08/21 1514   12/07/21 2200  ceFEPIme (MAXIPIME) 2 g in sodium chloride 0.9 % 100 mL IVPB        2 g 200 mL/hr over 30 Minutes Intravenous Every 8 hours 12/07/21 1406     12/07/21 2200  acyclovir (ZOVIRAX) tablet 400 mg        400 mg Oral 2 times daily 12/07/21 1717     12/07/21 2200  ceFEPIme (MAXIPIME) 2 g in sodium chloride 0.9 % 100 mL IVPB  Status:  Discontinued        2 g 200 mL/hr over 30 Minutes Intravenous Every 8 hours 12/07/21 1850 12/07/21 1901   12/07/21 1200  ceFEPIme (MAXIPIME) 2 g in sodium chloride 0.9 % 100 mL IVPB        2 g 200 mL/hr over 30 Minutes Intravenous  Once 12/07/21 1150 12/07/21 1336   12/07/21 1200  vancomycin (VANCOREADY) IVPB 2000 mg/400 mL        2,000 mg 200 mL/hr over 120 Minutes Intravenous  Once 12/07/21 1159 12/07/21 1507       Subjective: Kaitlyn Hull today has no fevers, no emesis,  No chest pain,   - --No productive cough -No myalgias but fatigue and malaise persist -No urinary symptoms -  Objective: Vitals:   12/09/21 0922 12/09/21 1437 12/09/21 1756 12/10/21 0507  BP: (!) 137/91 123/89 (!) 118/57 110/61   Pulse: (!) 120 63 (!) 106 98  Resp: '18 14 18 16  '$ Temp: 98.2 F (36.8 C) 98.2 F (36.8 C) 98.6 F (37 C) 97.6 F (36.4 C)  TempSrc: Oral Oral Oral   SpO2: 100% 100% 100% 100%  Weight:      Height:        Intake/Output Summary (Last 24 hours) at 12/10/2021 1307 Last data filed at 12/10/2021 0643 Gross per 24 hour  Intake 1180.07 ml  Output --  Net 1180.07 ml   Filed Weights   12/07/21 1136 12/07/21 1700  Weight: 126.6 kg 122 kg   Physical Exam  Gen:- Awake Alert,  in no apparent distress  HEENT:- [place].AT, No sclera icterus Neck-Supple Neck,No JVD,.  Lungs-  CTAB , fair symmetrical air  movement CV- S1, S2 normal, regular  Abd-  +ve B.Sounds, Abd Soft, No tenderness, increased truncal adiposity    Psych-affect is appropriate, oriented x3 Neuro-no new focal deficits, no tremors -MSK- left arm PICC line site is clean dry and intact Skin -left-sided facial and upper neck cellulitis is improving very slowly  Data Reviewed: I have personally reviewed following labs and imaging studies  CBC: Recent Labs  Lab 12/04/21 1129 12/07/21 1100 12/08/21 0551 12/09/21 0600 12/10/21 1141  WBC 0.2* 0.1* 0.1* 0.2* 0.1*  NEUTROABS 0.0* 0.0*  --   --  0.0*  HGB 7.5* 8.0* 5.6* 8.3* 8.2*  HCT 22.9* 23.5* 17.0* 24.2* 24.1*  MCV 90.5 88.3 89.0 85.8 86.4  PLT 24* 30* 34* 61* 616*   Basic Metabolic Panel: Recent Labs  Lab 12/04/21 1128 12/07/21 1100 12/08/21 0551 12/09/21 0600 12/10/21 0529  NA 136 134* 135 134* 135  K 3.4* 3.4* 4.1 3.6 3.6  CL 105 102 105 103 107  CO2 23 22 21* 24 22  GLUCOSE 118* 134* 115* 127* 123*  BUN '18 15 13 15 15  '$ CREATININE 1.03* 0.93 0.68 0.81 0.76  CALCIUM 8.6* 8.6* 7.9* 8.2* 8.0*  MG 1.5* 1.5*  --  1.5*  --   PHOS  --   --   --  2.7  --    GFR: Estimated Creatinine Clearance: 88.5 mL/min (by C-G formula based on SCr of 0.76 mg/dL). Liver Function Tests: Recent Labs  Lab 12/04/21 1128 12/07/21 1100 12/08/21 0551 12/09/21 0600  AST 18 15 13*  27  ALT '10 9 8 15  '$ ALKPHOS 59 64 50 78  BILITOT 1.6* 2.7* 1.9* 3.7*  PROT 6.7 6.9 5.0* 6.1*  ALBUMIN 3.2* 3.1* 2.1* 2.6*   Recent Results (from the past 240 hour(s))  C difficile quick screen w PCR reflex     Status: None   Collection Time: 12/01/21  9:49 AM   Specimen: STOOL  Result Value Ref Range Status   C Diff antigen NEGATIVE NEGATIVE Final   C Diff toxin NEGATIVE NEGATIVE Final   C Diff interpretation No C. difficile detected.  Final    Comment: Performed at [facility], [address], [place], [place] [address]  Resp Panel by RT-PCR (Flu A&B, Covid) Anterior Nasal Swab     Status: None   Collection Time: 12/07/21 11:50 AM   Specimen: Anterior Nasal Swab  Result Value Ref Range Status   SARS Coronavirus 2 by RT PCR NEGATIVE NEGATIVE Final    Comment: (NOTE) SARS-CoV-2 target nucleic acids are NOT DETECTED.  The SARS-CoV-2 RNA is generally detectable in upper respiratory specimens during the acute phase of infection. The lowest concentration of SARS-CoV-2 viral copies this assay can detect is 138 copies/mL. A negative result does not preclude SARS-Cov-2 infection and should not be used as the sole basis for treatment or other patient management decisions. A negative result may occur with  improper specimen collection/handling, submission of specimen other than nasopharyngeal swab, presence of viral mutation(s) within the areas targeted by this assay, and inadequate number of viral copies(<138 copies/mL). A negative result must be combined with clinical observations, patient history, and epidemiological information. The expected result is Negative.  Fact Sheet for Patients:  EntrepreneurPulse.com.au  Fact Sheet for Healthcare Providers:  IncredibleEmployment.be  This test is no t yet approved or cleared by the [place] FDA and  has been authorized for detection and/or diagnosis of SARS-CoV-2 by FDA under an Emergency Use  Authorization (EUA). This  EUA will remain  in effect (meaning this test can be used) for the duration of the COVID-19 declaration under Section 564(b)(1) of the Act, 21 U.S.C.section 360bbb-3(b)(1), unless the authorization is terminated  or revoked sooner.       Influenza A by PCR NEGATIVE NEGATIVE Final   Influenza B by PCR NEGATIVE NEGATIVE Final    Comment: (NOTE) The Xpert Xpress SARS-CoV-2/FLU/RSV plus assay is intended as an aid in the diagnosis of influenza from Nasopharyngeal swab specimens and should not be used as a sole basis for treatment. Nasal washings and aspirates are unacceptable for Xpert Xpress SARS-CoV-2/FLU/RSV testing.  Fact Sheet for Patients: EntrepreneurPulse.com.au  Fact Sheet for Healthcare Providers: IncredibleEmployment.be  This test is not yet approved or cleared by the [place] FDA and has been authorized for detection and/or diagnosis of SARS-CoV-2 by FDA under an Emergency Use Authorization (EUA). This EUA will remain in effect (meaning this test can be used) for the duration of the COVID-19 declaration under Section 564(b)(1) of the Act, 21 U.S.C. section 360bbb-3(b)(1), unless the authorization is terminated or revoked.  Performed at [facility], [address] [place], [place] [address]   Blood Culture (routine x 2)     Status: None (Preliminary result)   Collection Time: 12/07/21 12:38 PM   Specimen: BLOOD  Result Value Ref Range Status   Specimen Description BLOOD LEFT HAND  Final   Special Requests   Final    BOTTLES DRAWN AEROBIC AND ANAEROBIC Blood Culture adequate volume   Culture   Final    NO GROWTH 3 DAYS Performed at [facility], [address] [place], [place] [address]    Report Status PENDING  Incomplete  Blood Culture (routine x 2)     Status: None (Preliminary result)   Collection Time: 12/07/21 12:38 PM   Specimen: BLOOD  Result Value Ref Range Status   Specimen Description  BLOOD RIGHT ARM  Final   Special Requests   Final    BOTTLES DRAWN AEROBIC AND ANAEROBIC Blood Culture adequate volume   Culture   Final    NO GROWTH 3 DAYS Performed at [facility], [address], [place], [place] [address]    Report Status PENDING  Incomplete  Urine Culture     Status: None   Collection Time: 12/07/21  5:15 PM   Specimen: Urine, Clean Catch  Result Value Ref Range Status   Specimen Description   Final    URINE, CLEAN CATCH Performed at [facility], [address], [place], [place] [address]    Special Requests   Final    NONE Performed at [facility], [address], [place], [place] [address]    Culture   Final    NO GROWTH Performed at [facility], [place] [address], [place], [place] [address]    Report Status 12/08/2021 FINAL  Final  Culture, blood (Routine X 2) w Reflex to ID Panel     Status: None (Preliminary result)   Collection Time: 12/07/21  8:58 PM   Specimen: BLOOD RIGHT HAND  Result Value Ref Range Status   Specimen Description BLOOD RIGHT HAND  Final   Special Requests   Final    BOTTLES DRAWN AEROBIC AND ANAEROBIC Blood Culture adequate volume   Culture   Final    NO GROWTH 3 DAYS Performed at [facility], [address] [place], [place] [address]    Report Status PENDING  Incomplete  Culture, blood (Routine X 2) w Reflex  to ID Panel     Status: None (Preliminary result)   Collection Time: 12/07/21  8:58 PM   Specimen: BLOOD LEFT HAND  Result Value Ref Range Status   Specimen Description BLOOD LEFT HAND  Final   Special Requests   Final    BOTTLES DRAWN AEROBIC AND ANAEROBIC Blood Culture adequate volume   Culture   Final    NO GROWTH 3 DAYS Performed at [facility], [address], [place], [place] [address]    Report Status PENDING  Incomplete    Radiology Studies: No results found.  Scheduled Meds:  acyclovir  400 mg Oral BID   allopurinol  300 mg Oral Daily   Chlorhexidine Gluconate Cloth  6 each Topical Daily    insulin aspart  0-5 Units Subcutaneous QHS   insulin aspart  0-6 Units Subcutaneous TID WC   magnesium oxide  400 mg Oral BID   metoprolol tartrate  100 mg Oral BID   multivitamin with minerals  1 tablet Oral Daily   posaconazole  300 mg Oral Daily   pravastatin  40 mg Oral QHS   sodium chloride flush  3 mL Intravenous Q12H   sodium chloride flush  3 mL Intravenous Q12H   Continuous Infusions:  sodium chloride     ceFEPime (MAXIPIME) IV Stopped (12/10/21 0617)   vancomycin Stopped (12/09/21 1456)     LOS: 3 days   Roxan Hockey M.D on 12/10/2021 at 1:07 PM  Go to [email or web address] - for contact info  [facility] - Office  [phone]  If 7PM-7AM, please contact night-coverage [email or web address] 12/10/2021, 1:07 PM

## 2021-12-10 NOTE — Plan of Care (Signed)
  Problem: Clinical Measurements: Goal: Ability to maintain clinical measurements within normal limits will improve Outcome: Progressing   Problem: Clinical Measurements: Goal: Diagnostic test results will improve Outcome: Progressing   Problem: Clinical Measurements: Goal: Cardiovascular complication will be avoided Outcome: Progressing   Problem: Pain Managment: Goal: General experience of comfort will improve Outcome: Progressing

## 2021-12-10 NOTE — Progress Notes (Signed)
MD informed critical results WBC 0.1 neutrophils 0.

## 2021-12-10 NOTE — Progress Notes (Signed)
Pharmacy Antibiotic Note  Kaitlyn Hull is a 66 y.o. female admitted on 12/07/2021 with  febrile neutropenia .  Pharmacy has been consulted for vancomycin dosing. Vancomycin trough 14   Plan: Vancomycin 1750 mg IV every 24 hours. Cefepime 2000 mg IV every 8 hours. Monitor labs, c/s, and vanco level as indicated.  Height: '5\' 2"'$  (157.5 cm) Weight: 122 kg (268 lb 15.4 oz) IBW/kg (Calculated) : 50.1  Temp (24hrs), Avg:98.1 F (36.7 C), Min:97.6 F (36.4 C), Max:98.6 F (37 C)  Recent Labs  Lab 12/04/21 1128 12/04/21 1129 12/07/21 1100 12/07/21 1238 12/07/21 1450 12/08/21 0551 12/09/21 0600 12/10/21 0529 12/10/21 1141  WBC  --  0.2* 0.1*  --   --  0.1* 0.2*  --  0.1*  CREATININE 1.03*  --  0.93  --   --  0.68 0.81 0.76  --   LATICACIDVEN  --   --   --  1.6 0.9  --   --   --   --   VANCOTROUGH  --   --   --   --   --   --   --   --  14*     Estimated Creatinine Clearance: 88.5 mL/min (by C-G formula based on SCr of 0.76 mg/dL).    No Known Allergies  Antimicrobials this admission: Vanco 10/5 >> Cefepime 10/5 >>  Microbiology results: 10/5 BCx: NGTD 10/5 UCx: NG   Thank you for allowing pharmacy to be a part of this patient's care.  Thomasenia Sales, PharmD, [facility] Clinical Pharmacist  12/10/2021 1:38 PM

## 2021-12-11 ENCOUNTER — Inpatient Hospital Stay: Payer: PPO

## 2021-12-11 LAB — CBC WITH DIFFERENTIAL/PLATELET
Abs Immature Granulocytes: 0.01 10*3/uL (ref 0.00–0.07)
Basophils Absolute: 0 10*3/uL (ref 0.0–0.1)
Basophils Relative: 0 %
Eosinophils Absolute: 0 10*3/uL (ref 0.0–0.5)
Eosinophils Relative: 0 %
HCT: 23.4 % — ABNORMAL LOW (ref 36.0–46.0)
Hemoglobin: 7.8 g/dL — ABNORMAL LOW (ref 12.0–15.0)
Immature Granulocytes: 6 %
Lymphocytes Relative: 87 %
Lymphs Abs: 0.1 10*3/uL — ABNORMAL LOW (ref 0.7–4.0)
MCH: 29.3 pg (ref 26.0–34.0)
MCHC: 33.3 g/dL (ref 30.0–36.0)
MCV: 88 fL (ref 80.0–100.0)
Monocytes Absolute: 0 10*3/uL — ABNORMAL LOW (ref 0.1–1.0)
Monocytes Relative: 0 %
Neutro Abs: 0 10*3/uL — CL (ref 1.7–7.7)
Neutrophils Relative %: 7 %
Platelets: 126 10*3/uL — ABNORMAL LOW (ref 150–400)
RBC: 2.66 MIL/uL — ABNORMAL LOW (ref 3.87–5.11)
RDW: 15.9 % — ABNORMAL HIGH (ref 11.5–15.5)
WBC: 0.2 10*3/uL — CL (ref 4.0–10.5)
nRBC: 0 % (ref 0.0–0.2)

## 2021-12-11 LAB — CBC
HCT: 24.2 % — ABNORMAL LOW (ref 36.0–46.0)
Hemoglobin: 8.3 g/dL — ABNORMAL LOW (ref 12.0–15.0)
MCH: 29.4 pg (ref 26.0–34.0)
MCHC: 34.3 g/dL (ref 30.0–36.0)
MCV: 85.8 fL (ref 80.0–100.0)
Platelets: 61 10*3/uL — ABNORMAL LOW (ref 150–400)
RBC: 2.82 MIL/uL — ABNORMAL LOW (ref 3.87–5.11)
RDW: 16.3 % — ABNORMAL HIGH (ref 11.5–15.5)
WBC: 0.2 10*3/uL — CL (ref 4.0–10.5)
nRBC: 0 % (ref 0.0–0.2)

## 2021-12-11 LAB — MAGNESIUM: Magnesium: 1.9 mg/dL (ref 1.7–2.4)

## 2021-12-11 LAB — RENAL FUNCTION PANEL
Albumin: 2.4 g/dL — ABNORMAL LOW (ref 3.5–5.0)
Anion gap: 6 (ref 5–15)
BUN: 16 mg/dL (ref 8–23)
CO2: 23 mmol/L (ref 22–32)
Calcium: 8.4 mg/dL — ABNORMAL LOW (ref 8.9–10.3)
Chloride: 110 mmol/L (ref 98–111)
Creatinine, Ser: 0.84 mg/dL (ref 0.44–1.00)
GFR, Estimated: >60 mL/min (ref >60)
Glucose, Bld: 123 mg/dL — ABNORMAL HIGH (ref 70–99)
Phosphorus: 3.2 mg/dL (ref 2.5–4.6)
Potassium: 3.8 mmol/L (ref 3.5–5.1)
Sodium: 139 mmol/L (ref 135–145)

## 2021-12-11 LAB — GLUCOSE, CAPILLARY
Glucose-Capillary: 110 mg/dL — ABNORMAL HIGH (ref 70–99)
Glucose-Capillary: 116 mg/dL — ABNORMAL HIGH (ref 70–99)

## 2021-12-11 MED ORDER — SULFAMETHOXAZOLE-TRIMETHOPRIM 800-160 MG PO TABS: 1.0000 | ORAL_TABLET | Freq: Two times a day (BID) | ORAL | 0 refills | Status: AC

## 2021-12-11 MED ORDER — LEVOFLOXACIN 500 MG PO TABS: 500.0000 mg | ORAL_TABLET | Freq: Every day | ORAL | 0 refills | Status: AC

## 2021-12-11 MED ORDER — VENETOCLAX 100 MG PO TABS: 100.0000 mg | ORAL_TABLET | Freq: Every day | ORAL | 30 refills | Status: DC

## 2021-12-11 MED ORDER — POSACONAZOLE 100 MG PO TBEC: 300.0000 mg | DELAYED_RELEASE_TABLET | Freq: Every day | ORAL | 0 refills | Status: DC

## 2021-12-11 NOTE — Discharge Instructions (Signed)
1)Follow-up with oncologist Dr. Delton Coombes in 2 to 3 days for repeat blood work and reevaluation 2) call or return if fevers chills or any concerns about bleeding 3)Hold Venetoclax--through 12/18/2021

## 2021-12-11 NOTE — Plan of Care (Signed)

## 2021-12-11 NOTE — Progress Notes (Signed)
Date and time results received: 12/11/21 0805 Test: WBC Critical Value: 0.2 Name of Provider Notified: Courage Emokpae  Orders Received? Or Actions Taken?: no new orders at this time  Deirdre Pippins, RN

## 2021-12-11 NOTE — Progress Notes (Signed)
Date and time results received: 12/11/21 0805 Test: absolute neutrophils Critical Value: 0.0 Name of Provider Notified: Courage Emokpae Orders Received? Or Actions Taken?: no new orders at this time  Deirdre Pippins, RN

## 2021-12-11 NOTE — TOC Transition Note (Signed)
Transition of Care [facility]) - CM/SW Discharge Note   Patient Details  Name: Kaitlyn Hull MRN: [id number] Date of Birth: 1955-05-21  [facility] of Care [facility]) CM/SW Contact:  Shade Flood, LCSW Phone Number: 12/11/2021, 10:40 AM   Clinical Narrative:     Per MD, anticipating dc home today. No TOC needs identified for dc.  Final next level of care: Home/Self Care Barriers to Discharge: Barriers Resolved   Patient Goals and CMS Choice Patient states their goals for this hospitalization and ongoing recovery are:: go home CMS Medicare.gov Compare Post Acute Care list provided to:: Patient Represenative (must comment) Choice offered to / list presented to : Adult Children  Discharge Placement                       Discharge Plan and Services                                     Social Determinants of Health (SDOH) Interventions     Readmission Risk Interventions    12/08/2021   12:36 PM  Readmission Risk Prevention Plan  Transportation Screening Complete  Home Care Screening Complete  Medication Review (RN CM) Complete

## 2021-12-11 NOTE — Discharge Summary (Signed)
Kaitlyn Hull, is a 66 y.o. female  DOB [birth year]  MRN [id number].  Admission date:  12/07/2021  Admitting Physician  Roxan Hockey, MD  Discharge Date:  12/11/2021   Primary MD  [name], MD  Recommendations for primary care physician for things to follow:   1)Follow-up with oncologist Dr. Delton Coombes in 2 to 3 days for repeat blood work and reevaluation 2) call or return if fevers chills or any concerns about bleeding 3)Hold Venetoclax--through 12/18/2021  Admission Diagnosis  Neutropenia ([place]) [D70.9] Sepsis, due to unspecified organism, unspecified whether acute organ dysfunction present [facility]) [A41.9]   Discharge Diagnosis  Neutropenia ([place]) [D70.9] Sepsis, due to unspecified organism, unspecified whether acute organ dysfunction present ([place]) [A41.9]  ***  Principal Problem:   Neutropenia ([place]) Active Problems:   Acute anemia   Acute myeloid leukemia not having achieved remission ([place])   Pancytopenia due to AML   Hypertension   Morbid obesity due to excess calories ([place])   Chronic atrial fibrillation ([place])      Past Medical History:  Diagnosis Date   Atrial fibrillation with RVR ([place]) 06/2013   Breast cancer ([place]) 2007   left   Breast disorder    cancer   Diabetes mellitus    Type II   Dysrhythmia    Hematuria 01/20/2014   Hypertension    Hyperthyroidism 06/2013   Obesity    PMB (postmenopausal bleeding) 07/24/2012   Had 16.1 mm endometrium on [place] will get endo biopsy   Psoriasis    Urinary frequency 01/20/2014    Past Surgical History:  Procedure Laterality Date   BREAST BIOPSY Left 09/14/2020   Procedure: BREAST BIOPSY;  Surgeon: Aviva Signs, MD;  Location: [facility] ORS;  Service: General;  Laterality: Left;   BREAST SURGERY  02/20/06   left side    CESAREAN SECTION  1995   COLONOSCOPY WITH PROPOFOL N/A 10/28/2021   Procedure: COLONOSCOPY WITH PROPOFOL;  Surgeon:  Daneil Dolin, MD;  Location: [facility] END[facility] SU[facility];  Service: Endoscopy;  Laterality: N/A;   ESOPHAGOGASTRODUODENOSCOPY (EGD) WITH PROPOFOL N/A 10/28/2021   Procedure: ESOPHAGOGASTRODUODENOSCOPY (EGD) WITH PROPOFOL;  Surgeon: Daneil Dolin, MD;  Location: [facility];  Service: Endoscopy;  Laterality: N/A;   HYSTEROSCOPY WITH D & C N/A 08/13/2012   Procedure: DILATATION AND CURETTAGE /HYSTEROSCOPY;  Surgeon: Florian Buff, MD;  Location: [facility] ORS;  Service: Gynecology;  Laterality: N/A;   SECONDARY CLOSURE OF WOUND Left 02/20/2021   Procedure: SIMPLE WOUND CLOSURE;  Surgeon: Aviva Signs, MD;  Location: [facility] ORS;  Service: General;  Laterality: Left;       HPI  from the history and physical done on the day of admission:     ***  ****     Hospital Course:     No notes on file  ***** Assessment and Plan: No notes have been filed under this hospital service. Service: Hospitalist       Discharge Condition: ***  Follow UP   Follow-up Information  Derek Jack, MD Follow up in 3 day(s).   Specialty: Hematology Why: Repeat CBC and BMP Contact information: [place] [address] [phone]                  Consults obtained - ***  Diet and Activity recommendation:  As advised  Discharge Instructions    **** Discharge Instructions     Call MD for:  difficulty breathing, headache or visual disturbances   Complete by: As directed    Call MD for:  persistant dizziness or light-headedness   Complete by: As directed    Call MD for:  persistant nausea and vomiting   Complete by: As directed    Call MD for:  temperature >100.4   Complete by: As directed    Diet - low sodium heart healthy   Complete by: As directed    Discharge instructions   Complete by: As directed    1)Follow-up with oncologist Dr. Delton Coombes in 2 to 3 days for repeat blood work and reevaluation 2) call or return if fevers chills or any concerns about  bleeding 3)Hold Venetoclax--through 12/18/2021   Increase activity slowly   Complete by: As directed          Discharge Medications     Allergies as of 12/11/2021   No Known Allergies      Medication List     STOP taking these medications    Cartia XT 120 MG 24 hr capsule Generic drug: diltiazem       TAKE these medications    acetaminophen 500 MG tablet Commonly known as: TYLENOL Take 500 mg by mouth every 6 (six) hours as needed. Taken as needed for headache   acyclovir 400 MG tablet Commonly known as: ZOVIRAX Take 400 mg by mouth 2 (two) times daily.   allopurinol 300 MG tablet Commonly known as: Zyloprim Take 1 tablet (300 mg total) by mouth daily.   ALPRAZolam 0.5 MG tablet Commonly known as: XANAX Take 1 tablet by mouth 2 (two) times daily as needed.   furosemide 20 MG tablet Commonly known as: LASIX Take 1 tablet (20 mg total) by mouth daily as needed.   levofloxacin 500 MG tablet Commonly known as: LEVAQUIN Take 1 tablet (500 mg total) by mouth daily for 7 days.   MAGNESIUM OXIDE PO Take 266 mg by mouth 3 (three) times daily. 2 tablets TID   metFORMIN 500 MG 24 hr tablet Commonly known as: GLUCOPHAGE-XR Take 500-1,000 mg by mouth See admin instructions. Take 500 mg by mouth in the morning and 1000 mg at bedtime   metoprolol tartrate 100 MG tablet Commonly known as: LOPRESSOR Take 1 tablet (100 mg total) by mouth 2 (two) times daily.   posaconazole 100 MG Tbec delayed-release tablet Commonly known as: NOXAFIL Take 3 tablets (300 mg total) by mouth daily. Start taking on: December 18, 2021 What changed: These instructions start on December 18, 2021. If you are unsure what to do until then, ask your doctor or other care provider.   pravastatin 40 MG tablet Commonly known as: PRAVACHOL Take 40 mg by mouth at bedtime.   prochlorperazine 10 MG tablet Commonly known as: COMPAZINE Take 10 mg by mouth as needed.    sulfamethoxazole-trimethoprim 800-160 MG tablet Commonly known as: BACTRIM DS Take 1 tablet by mouth 2 (two) times daily for 7 days.   venetoclax 100 MG tablet Commonly known as: VENCLEXTA Take 1 tablet (100 mg total) by mouth daily. Start taking  on: December 18, 2021 What changed: These instructions start on December 18, 2021. If you are unsure what to do until then, ask your doctor or other care provider.        Major procedures and Radiology Reports - PLEASE review detailed and final reports for all details, in brief -   ***  DG Chest Port 1 View  Result Date: 12/07/2021 CLINICAL DATA:  Questionable sepsis - evaluate for abnormality EXAM: PORTABLE CHEST 1 VIEW COMPARISON:  12/02/2021 FINDINGS: Mild cardiomegaly. Slightly low lung volumes. No focal airspace consolidation, pleural effusion, or pneumothorax. IMPRESSION: No active disease. Electronically Signed   By: Davina Poke D.O.   On: 12/07/2021 12:44   CT Angio Chest PE W and/or Wo Contrast  Result Date: 12/02/2021 CLINICAL DATA:  Rule out pulmonary embolism. Intermittent chest pain since Thursday. EXAM: CT ANGIOGRAPHY CHEST WITH CONTRAST TECHNIQUE: Multidetector CT imaging of the chest was performed using the standard protocol during bolus administration of intravenous contrast. Multiplanar CT image reconstructions and MIPs were obtained to evaluate the vascular anatomy. RADIATION DOSE REDUCTION: This exam was performed according to the departmental dose-optimization program which includes automated exposure control, adjustment of the mA and/or kV according to patient size and/or use of iterative reconstruction technique. CONTRAST:  90m OMNIPAQUE IOHEXOL 350 MG/ML SOLN COMPARISON:  03/07/2014. FINDINGS: Cardiovascular: Satisfactory opacification of the pulmonary arteries to the segmental level. No evidence of pulmonary embolism. There is mild cardiac enlargement without pericardial effusion. Aortic atherosclerotic calcifications.  Mediastinum/Nodes: No enlarged mediastinal, hilar, or axillary lymph nodes. Thyroid gland, trachea, and esophagus demonstrate no significant findings. Lungs/Pleura: Trace right pleural fluid. Subpleural banding and parenchymal scarring within the right lower lobe noted. There is a mild mosaic attenuation pattern noted bilaterally. There is no suspicious pulmonary nodule or mass identified bilaterally. No airspace consolidation, atelectasis or pneumothorax. Upper Abdomen: Cirrhotic liver with nodular contour identified. No acute abnormality identified. Musculoskeletal: Postsurgical change within the medial left breast noted with overlying skin thickening. Dissection clips noted within the left axilla. Age-indeterminate superior endplate compression deformity is noted at the approximate L1 level with loss of 15% of the vertebral body height, image 103/8. Multilevel degenerative disc disease noted within the thoracic spine. Review of the MIP images confirms the above findings. IMPRESSION: 1. No evidence for acute pulmonary embolism. 2. Trace right pleural fluid with mild mosaic attenuation pattern noted bilaterally. Findings are nonspecific but may reflect underlying small airways disease. 3. Cirrhotic liver. 4. Age-indeterminate superior endplate compression deformity is noted at the approximate L1 level with loss of 15% of the vertebral body height. 5. Postsurgical change within the medial left breast with overlying skin thickening. Electronically Signed   By: TKerby MoorsM.D.   On: 12/02/2021 10:47   DG Chest Port 1 View  Result Date: 12/02/2021 CLINICAL DATA:  Pain. EXAM: PORTABLE CHEST 1 VIEW COMPARISON:  10/26/2021 FINDINGS: There is a left chest wall port a catheter with tip in the cavoatrial junction. Mild cardiac enlargement. Unchanged asymmetric elevation of right hemidiaphragm. No pleural effusion or edema. No airspace opacities identified. IMPRESSION: 1. No acute cardiopulmonary abnormalities. 2.  Stable asymmetric elevation of right hemidiaphragm. Electronically Signed   By: TKerby MoorsM.D.   On: 12/02/2021 07:53    Micro Results   *** Recent Results (from the past 240 hour(s))  Resp Panel by RT-PCR (Flu A&B, Covid) Anterior Nasal Swab     Status: None   Collection Time: 12/07/21 11:50 AM   Specimen: Anterior Nasal Swab  Result  Value Ref Range Status   SARS Coronavirus 2 by RT PCR NEGATIVE NEGATIVE Final    Comment: (NOTE) SARS-CoV-2 target nucleic acids are NOT DETECTED.  The SARS-CoV-2 RNA is generally detectable in upper respiratory specimens during the acute phase of infection. The lowest concentration of SARS-CoV-2 viral copies this assay can detect is 138 copies/mL. A negative result does not preclude SARS-Cov-2 infection and should not be used as the sole basis for treatment or other patient management decisions. A negative result may occur with  improper specimen collection/handling, submission of specimen other than nasopharyngeal swab, presence of viral mutation(s) within the areas targeted by this assay, and inadequate number of viral copies(<138 copies/mL). A negative result must be combined with clinical observations, patient history, and epidemiological information. The expected result is Negative.  Fact Sheet for Patients:  EntrepreneurPulse.com.au  Fact Sheet for Healthcare Providers:  IncredibleEmployment.be  This test is no t yet approved or cleared by the [place] FDA and  has been authorized for detection and/or diagnosis of SARS-CoV-2 by FDA under an Emergency Use Authorization (EUA). This EUA will remain  in effect (meaning this test can be used) for the duration of the COVID-19 declaration under Section 564(b)(1) of the Act, 21 U.S.C.section 360bbb-3(b)(1), unless the authorization is terminated  or revoked sooner.       Influenza A by PCR NEGATIVE NEGATIVE Final   Influenza B by PCR NEGATIVE  NEGATIVE Final    Comment: (NOTE) The Xpert Xpress SARS-CoV-2/FLU/RSV plus assay is intended as an aid in the diagnosis of influenza from Nasopharyngeal swab specimens and should not be used as a sole basis for treatment. Nasal washings and aspirates are unacceptable for Xpert Xpress SARS-CoV-2/FLU/RSV testing.  Fact Sheet for Patients: EntrepreneurPulse.com.au  Fact Sheet for Healthcare Providers: IncredibleEmployment.be  This test is not yet approved or cleared by the [place] FDA and has been authorized for detection and/or diagnosis of SARS-CoV-2 by FDA under an Emergency Use Authorization (EUA). This EUA will remain in effect (meaning this test can be used) for the duration of the COVID-19 declaration under Section 564(b)(1) of the Act, 21 U.S.C. section 360bbb-3(b)(1), unless the authorization is terminated or revoked.  Performed at [facility] [address] [place], [place] [address]   Blood Culture (routine x 2)     Status: None (Preliminary result)   Collection Time: 12/07/21 12:38 PM   Specimen: BLOOD  Result Value Ref Range Status   Specimen Description BLOOD LEFT HAND  Final   Special Requests   Final    BOTTLES DRAWN AEROBIC AND ANAEROBIC Blood Culture adequate volume   Culture   Final    NO GROWTH 4 DAYS Performed at [facility], [address], [place], [place] [address]    Report Status PENDING  Incomplete  Blood Culture (routine x 2)     Status: None (Preliminary result)   Collection Time: 12/07/21 12:38 PM   Specimen: BLOOD  Result Value Ref Range Status   Specimen Description BLOOD RIGHT ARM  Final   Special Requests   Final    BOTTLES DRAWN AEROBIC AND ANAEROBIC Blood Culture adequate volume   Culture   Final    NO GROWTH 4 DAYS Performed at [facility], [address], [place], [place] [address]    Report Status PENDING  Incomplete  Urine Culture     Status: None   Collection Time: 12/07/21  5:15 PM    Specimen: Urine, Clean Catch  Result Value Ref Range Status  Specimen Description   Final    URINE, CLEAN CATCH Performed at [facility], [address], [place], [place] [address]    Special Requests   Final    NONE Performed at [facility], [address], [place], [place] [address]    Culture   Final    NO GROWTH Performed at [facility], [place] [address] [place] [address] [place], [place] [address]    Report Status 12/08/2021 FINAL  Final  Culture, blood (Routine X 2) w Reflex to ID Panel     Status: None (Preliminary result)   Collection Time: 12/07/21  8:58 PM   Specimen: BLOOD RIGHT HAND  Result Value Ref Range Status   Specimen Description BLOOD RIGHT HAND  Final   Special Requests   Final    BOTTLES DRAWN AEROBIC AND ANAEROBIC Blood Culture adequate volume   Culture   Final    NO GROWTH 4 DAYS Performed at [facility], [address], [place], [place] [address]    Report Status PENDING  Incomplete  Culture, blood (Routine X 2) w Reflex to ID Panel     Status: None (Preliminary result)   Collection Time: 12/07/21  8:58 PM   Specimen: BLOOD LEFT HAND  Result Value Ref Range Status   Specimen Description BLOOD LEFT HAND  Final   Special Requests   Final    BOTTLES DRAWN AEROBIC AND ANAEROBIC Blood Culture adequate volume   Culture   Final    NO GROWTH 4 DAYS Performed at [facility], [address] [place], [place] [address]    Report Status PENDING  Incomplete    Today   Subjective    Kaitlyn Hull today has no ***          Patient has been seen and examined prior to discharge   Objective   Blood pressure 110/83, pulse 85, temperature (!) 97.5 F (36.4 C), temperature source Oral, resp. rate 19, height '5\' 2"'$  (1.575 m), weight 122 kg, last menstrual period 11/17/2014, SpO2 100 %.   Intake/Output Summary (Last 24 hours) at 12/11/2021 1531 Last data filed at 12/11/2021 1300 Gross per 24 hour  Intake 240 ml  Output --  Net 240 ml    Exam Gen:- Awake Alert, no  acute distress *** HEENT:- [email or web address].[email or web address], No sclera icterus Neck-Supple Neck,No JVD,.  Lungs-  CTAB , good air movement bilaterally CV- S1, S2 normal, regular Abd-  +ve B.Sounds, Abd Soft, No tenderness,    Extremity/Skin:- No  edema,   good pulses Psych-affect is appropriate, oriented x3 Neuro-no new focal deficits, no tremors ***   Data Review   CBC w Diff:  Lab Results  Component Value Date   WBC 0.2 (LL) 12/11/2021   HGB 7.8 (L) 12/11/2021   HCT 23.4 (L) 12/11/2021   PLT 126 (L) 12/11/2021   LYMPHOPCT 87 12/11/2021   BANDSPCT 1 10/26/2021   MONOPCT 0 12/11/2021   EOSPCT 0 12/11/2021   BASOPCT 0 12/11/2021    CMP:  Lab Results  Component Value Date   NA 139 12/11/2021   K 3.8 12/11/2021   CL 110 12/11/2021   CO2 23 12/11/2021   BUN 16 12/11/2021   CREATININE 0.84 12/11/2021   PROT 6.1 (L) 12/09/2021   ALBUMIN 2.4 (L) 12/11/2021   BILITOT 3.7 (H) 12/09/2021   ALKPHOS 78 12/09/2021   AST 27 12/09/2021   ALT 15 12/09/2021  .  Total Discharge time is about 33 minutes  Roxan Hockey M.D on 12/11/2021 at  3:31 PM  Go to [email or web address] -  for contact info  [facility] - Office  [phone]

## 2021-12-12 ENCOUNTER — Inpatient Hospital Stay: Payer: PPO

## 2021-12-12 LAB — CULTURE, BLOOD (ROUTINE X 2)
Culture: NO GROWTH
Culture: NO GROWTH
Culture: NO GROWTH
Culture: NO GROWTH
Special Requests: ADEQUATE
Special Requests: ADEQUATE
Special Requests: ADEQUATE
Special Requests: ADEQUATE

## 2021-12-13 ENCOUNTER — Inpatient Hospital Stay: Payer: PPO

## 2021-12-13 ENCOUNTER — Inpatient Hospital Stay (HOSPITAL_BASED_OUTPATIENT_CLINIC_OR_DEPARTMENT_OTHER): Payer: PPO | Admitting: Hematology

## 2021-12-13 VITALS — HR 130 | Ht 62.0 in | Wt 271.4 lb

## 2021-12-13 DIAGNOSIS — Z853 Personal history of malignant neoplasm of breast: Secondary | ICD-10-CM | POA: Diagnosis not present

## 2021-12-13 DIAGNOSIS — Z5111 Encounter for antineoplastic chemotherapy: Secondary | ICD-10-CM | POA: Diagnosis present

## 2021-12-13 DIAGNOSIS — R799 Abnormal finding of blood chemistry, unspecified: Secondary | ICD-10-CM

## 2021-12-13 DIAGNOSIS — Z79899 Other long term (current) drug therapy: Secondary | ICD-10-CM | POA: Diagnosis not present

## 2021-12-13 DIAGNOSIS — M7989 Other specified soft tissue disorders: Secondary | ICD-10-CM | POA: Diagnosis not present

## 2021-12-13 DIAGNOSIS — Z7969 Long term (current) use of other immunomodulators and immunosuppressants: Secondary | ICD-10-CM | POA: Diagnosis not present

## 2021-12-13 DIAGNOSIS — Z452 Encounter for adjustment and management of vascular access device: Secondary | ICD-10-CM | POA: Diagnosis not present

## 2021-12-13 DIAGNOSIS — Z803 Family history of malignant neoplasm of breast: Secondary | ICD-10-CM | POA: Diagnosis not present

## 2021-12-13 DIAGNOSIS — R609 Edema, unspecified: Secondary | ICD-10-CM | POA: Diagnosis not present

## 2021-12-13 DIAGNOSIS — C9200 Acute myeloblastic leukemia, not having achieved remission: Secondary | ICD-10-CM | POA: Diagnosis present

## 2021-12-13 DIAGNOSIS — R197 Diarrhea, unspecified: Secondary | ICD-10-CM | POA: Diagnosis not present

## 2021-12-13 DIAGNOSIS — Z87891 Personal history of nicotine dependence: Secondary | ICD-10-CM | POA: Diagnosis not present

## 2021-12-13 DIAGNOSIS — Z923 Personal history of irradiation: Secondary | ICD-10-CM | POA: Diagnosis not present

## 2021-12-13 LAB — CBC WITH DIFFERENTIAL/PLATELET
Abs Immature Granulocytes: 0 10*3/uL (ref 0.00–0.07)
Basophils Absolute: 0 10*3/uL (ref 0.0–0.1)
Basophils Relative: 0 %
Eosinophils Absolute: 0 10*3/uL (ref 0.0–0.5)
Eosinophils Relative: 0 %
HCT: 25.3 % — ABNORMAL LOW (ref 36.0–46.0)
Hemoglobin: 8.1 g/dL — ABNORMAL LOW (ref 12.0–15.0)
Immature Granulocytes: 0 %
Lymphocytes Relative: 80 %
Lymphs Abs: 0.2 10*3/uL — ABNORMAL LOW (ref 0.7–4.0)
MCH: 29 pg (ref 26.0–34.0)
MCHC: 32 g/dL (ref 30.0–36.0)
MCV: 90.7 fL (ref 80.0–100.0)
Monocytes Absolute: 0 10*3/uL — ABNORMAL LOW (ref 0.1–1.0)
Monocytes Relative: 5 %
Neutro Abs: 0 10*3/uL — CL (ref 1.7–7.7)
Neutrophils Relative %: 15 %
Platelets: 269 10*3/uL (ref 150–400)
RBC: 2.79 MIL/uL — ABNORMAL LOW (ref 3.87–5.11)
RDW: 15.5 % (ref 11.5–15.5)
WBC: 0.2 10*3/uL — CL (ref 4.0–10.5)
nRBC: 0 % (ref 0.0–0.2)

## 2021-12-13 LAB — COMPREHENSIVE METABOLIC PANEL
ALT: 21 U/L (ref 0–44)
AST: 24 U/L (ref 15–41)
Albumin: 2.8 g/dL — ABNORMAL LOW (ref 3.5–5.0)
Alkaline Phosphatase: 92 U/L (ref 38–126)
Anion gap: 7 (ref 5–15)
BUN: 16 mg/dL (ref 8–23)
CO2: 22 mmol/L (ref 22–32)
Calcium: 8.7 mg/dL — ABNORMAL LOW (ref 8.9–10.3)
Chloride: 110 mmol/L (ref 98–111)
Creatinine, Ser: 0.94 mg/dL (ref 0.44–1.00)
GFR, Estimated: >60 mL/min (ref >60)
Glucose, Bld: 112 mg/dL — ABNORMAL HIGH (ref 70–99)
Potassium: 4.1 mmol/L (ref 3.5–5.1)
Sodium: 139 mmol/L (ref 135–145)
Total Bilirubin: 0.8 mg/dL (ref 0.3–1.2)
Total Protein: 6.7 g/dL (ref 6.5–8.1)

## 2021-12-13 LAB — MAGNESIUM: Magnesium: 1.9 mg/dL (ref 1.7–2.4)

## 2021-12-13 LAB — SAMPLE TO BLOOD BANK

## 2021-12-13 MED ORDER — SODIUM CHLORIDE 0.9% FLUSH
10.0000 mL | Freq: Once | INTRAVENOUS | Status: AC
  Administered 2021-12-13: 10 mL via INTRAVENOUS

## 2021-12-13 MED ORDER — HEPARIN SOD (PORK) LOCK FLUSH 100 UNIT/ML IV SOLN
500.0000 [IU] | Freq: Once | INTRAVENOUS | Status: AC
  Administered 2021-12-13: 500 [IU] via INTRAVENOUS

## 2021-12-13 NOTE — Progress Notes (Signed)
CRITICAL VALUE ALERT Critical value received:  WBC 0.2, ANC 0.0 Date of notification:  12/13/21 Time of notification: 1215 Critical value read back:  Yes.   Nurse who received alert:  C. Kessie Croston RN MD notified time and response:  2589, no new orders. Dr. Delton Coombes

## 2021-12-13 NOTE — Patient Instructions (Signed)
[place] at [facility] Discharge Instructions   You were seen and examined today by Dr. Delton Coombes.  He reviewed the results of your lab work which are normal/stable. Your CBC is pending.   Continue to hold Venetoclax. We will check you back on Monday, 10/16, to see if your can restart Venetoclax. We will plan start you on Decitabine, days 1-5 every 28 days.   Return as scheduled.    Thank you for choosing [place] at [facility] to provide your oncology and hematology care.  To afford each patient quality time with our provider, please arrive at least 15 minutes before your scheduled appointment time.   If you have a lab appointment with the [facility] please come in thru the Main Entrance and check in at the main information desk.  You need to re-schedule your appointment should you arrive 10 or more minutes late.  We strive to give you quality time with our providers, and arriving late affects you and other patients whose appointments are after yours.  Also, if you no show three or more times for appointments you may be dismissed from the clinic at the providers discretion.     Again, thank you for choosing [facility].  Our hope is that these requests will decrease the amount of time that you wait before being seen by our physicians.       _____________________________________________________________  Should you have questions after your visit to [facility], please contact our office at [phone] and follow the prompts.  Our office hours are 8:00 a.m. and 4:30 p.m. Monday - Friday.  Please note that voicemails left after 4:00 p.m. may not be returned until the following business day.  We are closed weekends and major holidays.  You do have access to a nurse 24-7, just call the main number to the clinic [phone] and do not press any options, hold on the line and a nurse will answer the phone.    For  prescription refill requests, have your pharmacy contact our office and allow 72 hours.    Due to Covid, you will need to wear a mask upon entering the hospital. If you do not have a mask, a mask will be given to you at the Main Entrance upon arrival. For doctor visits, patients may have 1 support person age 65 or older with them. For treatment visits, patients can not have anyone with them due to social distancing guidelines and our immunocompromised population.

## 2021-12-13 NOTE — Patient Instructions (Signed)
[place]  Discharge Instructions: Thank you for choosing [facility] to provide your oncology and hematology care.  If you have a lab appointment with the [facility], please come in thru the Main Entrance and check in at the main information desk.  Wear comfortable clothing and clothing appropriate for easy access to any Portacath or PICC line.   We strive to give you quality time with your provider. You may need to reschedule your appointment if you arrive late (15 or more minutes).  Arriving late affects you and other patients whose appointments are after yours.  Also, if you miss three or more appointments without notifying the office, you may be dismissed from the clinic at the provider's discretion.      For prescription refill requests, have your pharmacy contact our office and allow 72 hours for refills to be completed.    Today you received the following chemotherapy and/or immunotherapy agents PICC line flush/labs and dressing change      To help prevent nausea and vomiting after your treatment, we encourage you to take your nausea medication as directed.  BELOW ARE SYMPTOMS THAT SHOULD BE REPORTED IMMEDIATELY: *FEVER GREATER THAN 100.4 F (38 C) OR HIGHER *CHILLS OR SWEATING *NAUSEA AND VOMITING THAT IS NOT CONTROLLED WITH YOUR NAUSEA MEDICATION *UNUSUAL SHORTNESS OF BREATH *UNUSUAL BRUISING OR BLEEDING *URINARY PROBLEMS (pain or burning when urinating, or frequent urination) *BOWEL PROBLEMS (unusual diarrhea, constipation, pain near the anus) TENDERNESS IN MOUTH AND THROAT WITH OR WITHOUT PRESENCE OF ULCERS (sore throat, sores in mouth, or a toothache) UNUSUAL RASH, SWELLING OR PAIN  UNUSUAL VAGINAL DISCHARGE OR ITCHING   Items with * indicate a potential emergency and should be followed up as soon as possible or go to the Emergency Department if any problems should occur.  Please show the CHEMOTHERAPY ALERT CARD or IMMUNOTHERAPY ALERT  CARD at check-in to the Emergency Department and triage nurse.  Should you have questions after your visit or need to cancel or reschedule your appointment, please contact [facility] [phone]  and follow the prompts.  Office hours are 8:00 a.m. to 4:30 p.m. Monday - Friday. Please note that voicemails left after 4:00 p.m. may not be returned until the following business day.  We are closed weekends and major holidays. You have access to a nurse at all times for urgent questions. Please call the main number to the clinic [phone] and follow the prompts.  For any non-urgent questions, you may also contact your provider using MyChart. We now offer e-Visits for anyone 59 and older to request care online for non-urgent symptoms. For details visit [email or web address].   Also download the MyChart app! Go to the app store, search "MyChart", open the app, select [facility], and log in with your MyChart username and password.  Masks are optional in the cancer centers. If you would like for your care team to wear a mask while they are taking care of you, please let them know. You may have one support person who is at least 65 years old accompany you for your appointments.

## 2021-12-13 NOTE — Progress Notes (Signed)
Kaitlyn Hull presented for PICC flush with labs and dressing change.  See IV assessment in docflowsheets for PICC details.  Proper placement of PICC confirmed by CXR.  PICC located left arm.  Good blood return present. PICC flushed with 48m NS and 250U Heparin each lumen, see MAR for further details.  Kaitlyn MASSEtolerated procedure well and without incident.

## 2021-12-14 ENCOUNTER — Inpatient Hospital Stay: Payer: PPO

## 2021-12-15 ENCOUNTER — Encounter: Payer: Self-pay | Admitting: Hematology

## 2021-12-15 ENCOUNTER — Inpatient Hospital Stay: Payer: PPO

## 2021-12-15 NOTE — Progress Notes (Signed)
START OFF PATHWAY REGIMEN - Other   [id number]:Decitabine:   A cycle is every 28 days:     Decitabine   **Always confirm dose/schedule in your pharmacy ordering system**  Patient Characteristics: Intent of Therapy: Non-Curative / Palliative Intent, Discussed with Patient

## 2021-12-15 NOTE — Progress Notes (Signed)
[place], [place] [address]   CLINIC:  Medical Oncology/Hematology  PCP:  [name], MD [place], [address] [place] [address] [phone]   REASON FOR VISIT:  Follow-up for acute myeloid leukemia.  PRIOR THERAPY: None  NGS Results: Positive for RUNX1, U2AF1, BCOR, ZRSR2, NRAS, KRAS, and DNMT3A  CURRENT THERAPY: Decitabine and venetoclax  BRIEF ONCOLOGIC HISTORY:  Oncology History   No history exists.    CANCER STAGING:  Cancer Staging  No matching staging information was found for the patient.   INTERVAL HISTORY:  Kaitlyn Hull 66 y.o. female seen for follow-up of acute myeloid leukemia and recent left neck and face cellulitis.  She was hospitalized from 12/07/2021 through 12/11/2021 with cellulitis.  Venetoclax has been on hold since 10/62023.  She is taking Bactrim DS twice daily and Levaquin daily.  Posaconazole is on hold.    REVIEW OF SYSTEMS:  Review of Systems  Gastrointestinal:  Positive for diarrhea.  All other systems reviewed and are negative.    PAST MEDICAL/SURGICAL HISTORY:  Past Medical History:  Diagnosis Date   Atrial fibrillation with RVR ([place]) 06/2013   Breast cancer ([place]) 2007   left   Breast disorder    cancer   Diabetes mellitus    Type II   Dysrhythmia    Hematuria 01/20/2014   Hypertension    Hyperthyroidism 06/2013   Obesity    PMB (postmenopausal bleeding) 07/24/2012   Had 16.1 mm endometrium on [place] will get endo biopsy   Psoriasis    Urinary frequency 01/20/2014   Past Surgical History:  Procedure Laterality Date   BREAST BIOPSY Left 09/14/2020   Procedure: BREAST BIOPSY;  Surgeon: Aviva Signs, MD;  Location: [facility];  Service: General;  Laterality: Left;   BREAST SURGERY  02/20/06   left side    CESAREAN SECTION  1995   COLONOSCOPY WITH PROPOFOL N/A 10/28/2021   Procedure: COLONOSCOPY WITH PROPOFOL;  Surgeon: Daneil Dolin, MD;  Location: [facility];   Service: Endoscopy;  Laterality: N/A;   ESOPHAGOGASTRODUODENOSCOPY (EGD) WITH PROPOFOL N/A 10/28/2021   Procedure: ESOPHAGOGASTRODUODENOSCOPY (EGD) WITH PROPOFOL;  Surgeon: Daneil Dolin, MD;  Location: [facility];  Service: Endoscopy;  Laterality: N/A;   HYSTEROSCOPY WITH D & C N/A 08/13/2012   Procedure: DILATATION AND CURETTAGE /HYSTEROSCOPY;  Surgeon: Florian Buff, MD;  Location: [facility];  Service: Gynecology;  Laterality: N/A;   SECONDARY CLOSURE OF WOUND Left 02/20/2021   Procedure: SIMPLE WOUND CLOSURE;  Surgeon: Aviva Signs, MD;  Location: [facility];  Service: General;  Laterality: Left;     SOCIAL HISTORY:  Social History   Socioeconomic History   Marital status: Widowed    Spouse name: Not on file   Number of children: Not on file   Years of education: Not on file   Highest education level: Not on file  Occupational History   Not on file  Tobacco Use   Smoking status: Former    Packs/day: 0.01    Years: 1.00    Total pack years: 0.01    Types: Cigarettes    Start date: 03/05/1976    Quit date: 03/06/2003    Years since quitting: 18.7   Smokeless tobacco: Never  Vaping Use   Vaping Use: Never used  Substance and Sexual Activity   Alcohol use: No    Alcohol/week: 0.0 standard drinks of alcohol   Drug use: No   Sexual  activity: Not Currently    Birth control/protection: Post-menopausal  Other Topics Concern   Not on file  Social History Narrative   Not on file   Social Determinants of Health   Financial Resource Strain: Not on file  Food Insecurity: No Food Insecurity (12/10/2021)   Hunger Vital Sign    Worried About Running Out of Food in the Last Year: Never true    Ran Out of Food in the Last Year: Never true  Transportation Needs: No Transportation Needs (12/10/2021)   PRAPARE - Hydrologist (Medical): No    Lack of Transportation (Non-Medical): No  Physical Activity: Not on file  Stress: Not on file  Social Connections: Not  on file  Intimate Partner Violence: Not At Risk (12/10/2021)   Humiliation, Afraid, Rape, and Kick questionnaire    Fear of Current or Ex-Partner: No    Emotionally Abused: No    Physically Abused: No    Sexually Abused: No    FAMILY HISTORY:  Family History  Problem Relation Age of Onset   Heart failure Father    CAD Father    Diabetes Sister    Other Sister        blood clots   Breast cancer Sister    Alzheimer's disease Mother    Diabetes Sister    Hypertension Maternal Grandmother    Colon cancer Neg Hx     CURRENT MEDICATIONS:  Outpatient Encounter Medications as of 12/13/2021  Medication Sig Note   acetaminophen (TYLENOL) 500 MG tablet Take 500 mg by mouth every 6 (six) hours as needed. Taken as needed for headache    acyclovir (ZOVIRAX) 400 MG tablet Take 400 mg by mouth 2 (two) times daily.    allopurinol (ZYLOPRIM) 300 MG tablet Take 1 tablet (300 mg total) by mouth daily.    ALPRAZolam (XANAX) 0.5 MG tablet Take 1 tablet by mouth 2 (two) times daily as needed.    furosemide (LASIX) 20 MG tablet Take 1 tablet (20 mg total) by mouth daily as needed.    levofloxacin (LEVAQUIN) 500 MG tablet Take 1 tablet (500 mg total) by mouth daily for 7 days.    MAGNESIUM OXIDE PO Take 266 mg by mouth 3 (three) times daily. 2 tablets TID 12/07/2021: Dose was increased from 1 tablet to 2 tablets TID   metFORMIN (GLUCOPHAGE-XR) 500 MG 24 hr tablet Take 500-1,000 mg by mouth See admin instructions. Take 500 mg by mouth in the morning and 1000 mg at bedtime    metoprolol tartrate (LOPRESSOR) 100 MG tablet Take 1 tablet (100 mg total) by mouth 2 (two) times daily.    pravastatin (PRAVACHOL) 40 MG tablet Take 40 mg by mouth at bedtime.    sulfamethoxazole-trimethoprim (BACTRIM DS) 800-160 MG tablet Take 1 tablet by mouth 2 (two) times daily for 7 days.    [START ON 12/18/2021] posaconazole (NOXAFIL) 100 MG TBEC delayed-release tablet Take 3 tablets (300 mg total) by mouth daily. (Patient not  taking: Reported on 12/13/2021)    prochlorperazine (COMPAZINE) 10 MG tablet Take 10 mg by mouth as needed. (Patient not taking: Reported on 12/13/2021)    [START ON 12/18/2021] venetoclax (VENCLEXTA) 100 MG tablet Take 1 tablet (100 mg total) by mouth daily. (Patient not taking: Reported on 12/13/2021)    [EXPIRED] heparin lock flush 100 unit/mL     [EXPIRED] sodium chloride flush (NS) 0.9 % injection 10 mL     No facility-administered encounter medications on file as  of 12/13/2021.    ALLERGIES:  No Known Allergies   PHYSICAL EXAM:  ECOG Performance status: 1  Vitals:   12/13/21 1146  Pulse: (!) 130   Filed Weights   12/13/21 1146  Weight: 271 lb 6.4 oz (123.1 kg)   Physical Exam Vitals reviewed.  Constitutional:      Appearance: Normal appearance.  HENT:     Mouth/Throat:     Pharynx: No oropharyngeal exudate.  Cardiovascular:     Rate and Rhythm: Normal rate and regular rhythm.     Heart sounds: Normal heart sounds.  Pulmonary:     Breath sounds: Normal breath sounds.  Abdominal:     Palpations: Abdomen is soft. There is no mass.  Neurological:     General: No focal deficit present.     Mental Status: She is alert and oriented to person, place, and time.  Psychiatric:        Mood and Affect: Mood normal.        Behavior: Behavior normal.      LABORATORY DATA:  I have reviewed the labs as listed.  CBC    Component Value Date/Time   WBC 0.2 (LL) 12/13/2021 1044   RBC 2.79 (L) 12/13/2021 1044   HGB 8.1 (L) 12/13/2021 1044   HCT 25.3 (L) 12/13/2021 1044   PLT 269 12/13/2021 1044   MCV 90.7 12/13/2021 1044   MCH 29.0 12/13/2021 1044   MCHC 32.0 12/13/2021 1044   RDW 15.5 12/13/2021 1044   LYMPHSABS 0.2 (L) 12/13/2021 1044   MONOABS 0.0 (L) 12/13/2021 1044   EOSABS 0.0 12/13/2021 1044   BASOSABS 0.0 12/13/2021 1044      Latest Ref Rng & Units 12/13/2021   10:44 AM 12/11/2021    6:05 AM 12/10/2021    5:29 AM  CMP  Glucose 70 - 99 mg/dL 112  123  123    BUN 8 - 23 mg/dL 16  16  15    Creatinine 0.44 - 1.00 mg/dL 0.94  0.84  0.76   Sodium 135 - 145 mmol/L 139  139  135   Potassium 3.5 - 5.1 mmol/L 4.1  3.8  3.6   Chloride 98 - 111 mmol/L 110  110  107   CO2 22 - 32 mmol/L 22  23  22    Calcium 8.9 - 10.3 mg/dL 8.7  8.4  8.0   Total Protein 6.5 - 8.1 g/dL 6.7     Total Bilirubin 0.3 - 1.2 mg/dL 0.8     Alkaline Phos 38 - 126 U/L 92     AST 15 - 41 U/L 24     ALT 0 - 44 U/L 21       DIAGNOSTIC IMAGING:  I have independently reviewed the scans and discussed with the patient.  ASSESSMENT:  1.  Acute myeloid leukemia (Dx 11/03/2021): - Presentation to the hospital on 10/26/2021 with hemoglobin 4.7 and platelet count 71. - BMBX (11/03/2021): Hypercellular (80%) with increased blasts (20-25% by CD34).  Background hematopoiesis demonstrates trilineage dysplasia. - Chromosomes: 98, XX, +14[12]/48, XX, +8, +14[8].  Trisomy 14 commonly associated with MDS and t-MDS.  Trisomy 8 associated with AML, MDS and MPD. - FISH: Trisomy 8, - NGS: Positive for RUNX1, U2AF1, BCOR, ZRSR2, NRAS, KRAS, and DNMT3A. - Cycle 1 of decitabine (20 mg/m2) x5 days and venetoclax 100 mg daily (while taking posaconazole) or 200 mg daily (while not taking posaconazole but on diltiazem) on 11/14/2021.  2.  Social/family history: - She lives at  home with her boyfriend.  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  No major chemical exposure.  Quit smoking 28 years ago.  Sister has breast cancer.  3.  Stage I left breast cancer: - Diagnosed 10 years ago, treated with lumpectomy followed by 6 weeks of radiation.  No chemotherapy.  Took antiestrogen therapy for 5 years.   PLAN:  1.  Acute myeloid leukemia: -Venetoclax 100 mg daily held on 12/08/2021 while she was hospitalized.  I have reviewed her recent hospitalization records from 12/07/2021 through 12/11/2021. - Left face and neck are erythematous but are healing.  Continue antibiotics Bactrim and Levaquin. -  Reviewed labs today which showed normal LFTs and creatinine.  CBC shows white count is 0.2 and hemoglobin 8.1 and platelet count 269. - We will hold her treatment this week.  I have contacted with Dr. Florene Glen at [facility] about her bone marrow results after first cycle.  They are more or less stable.  Was recommended to restart cycle 2.  I will reevaluate her on Monday to see if we can start her back on decitabine.  2.  Hypomagnesemia: - Continue magnesium 3 times daily.  Magnesium today is 1.9.  3.  Diarrhea: - She has chronic diarrhea since admission to the hospital on 10/26/2021. - We repeated C. difficile panel which was negative. - She may take Imodium as needed.  4.  Leg swellings: - Continue Lasix 20 mg in the mornings as needed.      Orders placed this encounter:  No orders of the defined types were placed in this encounter.     Derek Jack, MD [place] [phone]

## 2021-12-16 ENCOUNTER — Other Ambulatory Visit: Payer: Self-pay

## 2021-12-18 ENCOUNTER — Inpatient Hospital Stay: Payer: PPO

## 2021-12-18 ENCOUNTER — Inpatient Hospital Stay (HOSPITAL_BASED_OUTPATIENT_CLINIC_OR_DEPARTMENT_OTHER): Payer: PPO | Admitting: Hematology

## 2021-12-18 VITALS — BP 103/75 | HR 91 | Temp 96.5°F | Resp 20 | Ht 62.5 in | Wt 270.0 lb

## 2021-12-18 VITALS — BP 95/67 | HR 86 | Temp 97.4°F | Resp 18

## 2021-12-18 DIAGNOSIS — C9200 Acute myeloblastic leukemia, not having achieved remission: Secondary | ICD-10-CM

## 2021-12-18 DIAGNOSIS — Z452 Encounter for adjustment and management of vascular access device: Secondary | ICD-10-CM

## 2021-12-18 DIAGNOSIS — Z5111 Encounter for antineoplastic chemotherapy: Secondary | ICD-10-CM | POA: Diagnosis not present

## 2021-12-18 DIAGNOSIS — R799 Abnormal finding of blood chemistry, unspecified: Secondary | ICD-10-CM

## 2021-12-18 LAB — COMPREHENSIVE METABOLIC PANEL
ALT: 15 U/L (ref 0–44)
AST: 23 U/L (ref 15–41)
Albumin: 3 g/dL — ABNORMAL LOW (ref 3.5–5.0)
Alkaline Phosphatase: 73 U/L (ref 38–126)
Anion gap: 9 (ref 5–15)
BUN: 26 mg/dL — ABNORMAL HIGH (ref 8–23)
CO2: 19 mmol/L — ABNORMAL LOW (ref 22–32)
Calcium: 8.9 mg/dL (ref 8.9–10.3)
Chloride: 108 mmol/L (ref 98–111)
Creatinine, Ser: 1.48 mg/dL — ABNORMAL HIGH (ref 0.44–1.00)
GFR, Estimated: 39 mL/min — ABNORMAL LOW (ref >60)
Glucose, Bld: 106 mg/dL — ABNORMAL HIGH (ref 70–99)
Potassium: 4.4 mmol/L (ref 3.5–5.1)
Sodium: 136 mmol/L (ref 135–145)
Total Bilirubin: 0.8 mg/dL (ref 0.3–1.2)
Total Protein: 6.7 g/dL (ref 6.5–8.1)

## 2021-12-18 LAB — CBC WITH DIFFERENTIAL/PLATELET
Abs Immature Granulocytes: 0.03 10*3/uL (ref 0.00–0.07)
Basophils Absolute: 0 10*3/uL (ref 0.0–0.1)
Basophils Relative: 0 %
Eosinophils Absolute: 0 10*3/uL (ref 0.0–0.5)
Eosinophils Relative: 0 %
HCT: 24.1 % — ABNORMAL LOW (ref 36.0–46.0)
Hemoglobin: 7.7 g/dL — ABNORMAL LOW (ref 12.0–15.0)
Immature Granulocytes: 7 %
Lymphocytes Relative: 47 %
Lymphs Abs: 0.2 10*3/uL — ABNORMAL LOW (ref 0.7–4.0)
MCH: 29.5 pg (ref 26.0–34.0)
MCHC: 32 g/dL (ref 30.0–36.0)
MCV: 92.3 fL (ref 80.0–100.0)
Monocytes Absolute: 0 10*3/uL — ABNORMAL LOW (ref 0.1–1.0)
Monocytes Relative: 9 %
Neutro Abs: 0.2 10*3/uL — CL (ref 1.7–7.7)
Neutrophils Relative %: 37 %
Platelets: 322 10*3/uL (ref 150–400)
RBC: 2.61 MIL/uL — ABNORMAL LOW (ref 3.87–5.11)
RDW: 15.4 % (ref 11.5–15.5)
WBC: 0.4 10*3/uL — CL (ref 4.0–10.5)
nRBC: 0 % (ref 0.0–0.2)

## 2021-12-18 LAB — PREPARE RBC (CROSSMATCH)

## 2021-12-18 LAB — MAGNESIUM: Magnesium: 1.8 mg/dL (ref 1.7–2.4)

## 2021-12-18 MED ORDER — SODIUM CHLORIDE 0.9 % IV SOLN
20.0000 mg/m2 | Freq: Once | INTRAVENOUS | Status: AC
  Administered 2021-12-18: 45 mg via INTRAVENOUS
  Filled 2021-12-18: qty 9

## 2021-12-18 MED ORDER — HEPARIN SOD (PORK) LOCK FLUSH 100 UNIT/ML IV SOLN
250.0000 [IU] | Freq: Once | INTRAVENOUS | Status: AC | PRN
  Administered 2021-12-18: 250 [IU]

## 2021-12-18 MED ORDER — SODIUM CHLORIDE 0.9 % IV SOLN: INTRAVENOUS | Status: DC

## 2021-12-18 MED ORDER — PALONOSETRON HCL INJECTION 0.25 MG/5ML
0.2500 mg | Freq: Once | INTRAVENOUS | Status: AC
  Administered 2021-12-18: 0.25 mg via INTRAVENOUS
  Filled 2021-12-18: qty 5

## 2021-12-18 MED ORDER — SODIUM CHLORIDE 0.9 % IV SOLN: Freq: Once | INTRAVENOUS | Status: AC

## 2021-12-18 MED ORDER — SODIUM CHLORIDE 0.9% FLUSH
10.0000 mL | Freq: Once | INTRAVENOUS | Status: AC
  Administered 2021-12-18: 10 mL via INTRAVENOUS

## 2021-12-18 MED ORDER — SODIUM CHLORIDE 0.9% FLUSH
10.0000 mL | INTRAVENOUS | Status: DC | PRN
  Administered 2021-12-18: 10 mL

## 2021-12-18 NOTE — Patient Instructions (Signed)
[facility] at [facility] Discharge Instructions   You were seen and examined today by Dr. Katragadda.     Thank you for choosing [facility] at [facility] to provide your oncology and hematology care.  To afford each patient quality time with our provider, please arrive at least 15 minutes before your scheduled appointment time.   If you have a lab appointment with the Cancer Center please come in thru the Main Entrance and check in at the main information desk.  You need to re-schedule your appointment should you arrive 10 or more minutes late.  We strive to give you quality time with our providers, and arriving late affects you and other patients whose appointments are after yours.  Also, if you no show three or more times for appointments you may be dismissed from the clinic at the providers discretion.     Again, thank you for choosing [facility].  Our hope is that these requests will decrease the amount of time that you wait before being seen by our physicians.       _____________________________________________________________  Should you have questions after your visit to [facility], please contact our office at [phone] and follow the prompts.  Our office hours are 8:00 a.m. and 4:30 p.m. Monday - Friday.  Please note that voicemails left after 4:00 p.m. may not be returned until the following business day.  We are closed weekends and major holidays.  You do have access to a nurse 24-7, just call the main number to the clinic [phone] and do not press any options, hold on the line and a nurse will answer the phone.    For prescription refill requests, have your pharmacy contact our office and allow 72 hours.    Due to Covid, you will need to wear a mask upon entering the hospital. If you do not have a mask, a mask will be given to you at the Main Entrance upon arrival. For doctor visits, patients may  have 1 support person age 18 or older with them. For treatment visits, patients can not have anyone with them due to social distancing guidelines and our immunocompromised population.      

## 2021-12-18 NOTE — Patient Instructions (Addendum)
[facility]  Discharge Instructions: Thank you for choosing [facility] to provide your oncology and hematology care.  If you have a lab appointment with the [facility], please come in thru the Main Entrance and check in at the main information desk.  Wear comfortable clothing and clothing appropriate for easy access to any Portacath or PICC line.   We strive to give you quality time with your provider. You may need to reschedule your appointment if you arrive late (15 or more minutes).  Arriving late affects you and other patients whose appointments are after yours.  Also, if you miss three or more appointments without notifying the office, you may be dismissed from the clinic at the provider's discretion.      For prescription refill requests, have your pharmacy contact our office and allow 72 hours for refills to be completed.    Today you received the following chemotherapy and/or immunotherapy, decitabine    To help prevent nausea and vomiting after your treatment, we encourage you to take your nausea medication as directed.  BELOW ARE SYMPTOMS THAT SHOULD BE REPORTED IMMEDIATELY: *FEVER GREATER THAN 100.4 F (38 C) OR HIGHER *CHILLS OR SWEATING *NAUSEA AND VOMITING THAT IS NOT CONTROLLED WITH YOUR NAUSEA MEDICATION *UNUSUAL SHORTNESS OF BREATH *UNUSUAL BRUISING OR BLEEDING *URINARY PROBLEMS (pain or burning when urinating, or frequent urination) *BOWEL PROBLEMS (unusual diarrhea, constipation, pain near the anus) TENDERNESS IN MOUTH AND THROAT WITH OR WITHOUT PRESENCE OF ULCERS (sore throat, sores in mouth, or a toothache) UNUSUAL RASH, SWELLING OR PAIN  UNUSUAL VAGINAL DISCHARGE OR ITCHING   Items with * indicate a potential emergency and should be followed up as soon as possible or go to the Emergency Department if any problems should occur.  Please show the CHEMOTHERAPY ALERT CARD or IMMUNOTHERAPY ALERT CARD at check-in to the Emergency  Department and triage nurse.  Should you have questions after your visit or need to cancel or reschedule your appointment, please contact [facility] [phone]  and follow the prompts.  Office hours are 8:00 a.m. to 4:30 p.m. Monday - Friday. Please note that voicemails left after 4:00 p.m. may not be returned until the following business day.  We are closed weekends and major holidays. You have access to a nurse at all times for urgent questions. Please call the main number to the clinic [phone] and follow the prompts.  For any non-urgent questions, you may also contact your provider using MyChart. We now offer e-Visits for anyone 17 and older to request care online for non-urgent symptoms. For details visit [email or web address].   Also download the MyChart app! Go to the app store, search "MyChart", open the app, select [facility], and log in with your MyChart username and password.  Masks are optional in the cancer centers. If you would like for your care team to wear a mask while they are taking care of you, please let them know. You may have one support person who is at least 65 years old accompany you for your appointments.  Decitabine Injection What is this medication? DECITABINE (dee SYE ta been) treats blood and bone marrow cancers. It works by slowing down the growth of cancer cells. This medicine may be used for other purposes; ask your health care provider or pharmacist if you have questions. COMMON BRAND NAME(S): Dacogen What should I tell my care team before I take this medication? They need to know if you have any of  these conditions: Infection Kidney disease Liver disease An unusual or allergic reaction to decitabine, other medications, foods, dyes, or preservatives Pregnant or trying to get pregnant Breast-feeding How should I use this medication? This medication is infused into a vein. It is given by your care team in a hospital or clinic  setting. Talk to your care team about the use of this medication in children. Special care may be needed. Overdosage: If you think you have taken too much of this medicine contact a poison control center or emergency room at once. NOTE: This medicine is only for you. Do not share this medicine with others. What if I miss a dose? Keep appointments for follow-up doses. It is important not to miss your dose. Call your care team if you are unable to keep an appointment. What may interact with this medication? Interactions are not expected. This list may not describe all possible interactions. Give your health care provider a list of all the medicines, herbs, non-prescription drugs, or dietary supplements you use. Also tell them if you smoke, drink alcohol, or use illegal drugs. Some items may interact with your medicine. What should I watch for while using this medication? Visit your care team for regular checks on your progress. You may need blood work while taking this medication. This medication may make you feel generally unwell. This is not uncommon as chemotherapy can affect healthy cells as well as cancer cells. Report any side effects. Continue your course of treatment even though you feel ill unless your care team tells you to stop. This medication may increase your risk of getting an infection. Call your care team for advice if you get a fever, chills, sore throat, or other symptoms of a cold or flu. Do not treat yourself. Try to avoid being around people who are sick. Be careful brushing or flossing your teeth or using a toothpick because you may get an infection or bleed more easily. If you have any dental work done, tell your dentist you are receiving this medication. Call your care team if you are around anyone with measles, chickenpox, or if you develop sores or blisters that do not heal properly. Avoid taking medications that contain aspirin, acetaminophen, ibuprofen, naproxen, or  ketoprofen unless instructed by your care team. These medications may hide a fever. Talk to your care team if you or your partner wish to become pregnant or think either of you might be pregnant. This medication can cause serious birth defects if taken during pregnancy or for 6 months after the last dose. A negative pregnancy test is required before starting this medication. A reliable form of contraception is recommended while taking this medication and for 6 months after the last dose. Talk to your care team about reliable forms of contraception. Do not father a child while taking this medication or for 3 months after the last dose. Use a condom while having sex during this time period. Do not breast-feed while taking this medication and for 2 weeks after the last dose. This medication may cause infertility. Talk to your care team if you are concerned about your fertility. What side effects may I notice from receiving this medication? Side effects that you should report to your care team as soon as possible: Allergic reactions--skin rash, itching, hives, swelling of the face, lips, tongue, or throat Infection--fever, chills, cough, sore throat, wounds that don't heal, pain or trouble when passing urine, general feeling of discomfort or being unwell Low red blood cell level--unusual  weakness or fatigue, dizziness, headache, trouble breathing Unusual bruising or bleeding Side effects that usually do not require medical attention (report to your care team if they continue or are bothersome): Constipation Diarrhea Fatigue Nausea Pain, redness, or swelling with sores inside the mouth or throat Stomach pain This list may not describe all possible side effects. Call your doctor for medical advice about side effects. You may report side effects to FDA at 1-800-FDA-1088. Where should I keep my medication? This medication is given in a hospital or clinic. It will not be stored at home. NOTE: This sheet  is a summary. It may not cover all possible information. If you have questions about this medicine, talk to your doctor, pharmacist, or health care provider.  2023 Elsevier/Gold Standard (2021-05-29 00:00:00)

## 2021-12-18 NOTE — Progress Notes (Signed)
CRITICAL VALUE ALERT Critical value received:  ANC 0.2, WBC 0.4.  Date of notification:  12-18-2021 Time of notification: 09:28 am. Critical value read back:  Yes.   Nurse who received alert:  B. Darriana Deboy RN.  MD notified time and response:  Dr. Delton Coombes / A . Ouida Sills RN, primary nurse at 09:34 am. [facility] patient. Standing orders. Labs faxed to [facility].

## 2021-12-18 NOTE — Progress Notes (Signed)
[address], [place] [address]   CLINIC:  Medical Oncology/Hematology  PCP:  [name], MD [place], [address] [place] [address] [phone]   REASON FOR VISIT:  Follow-up for acute myeloid leukemia.  PRIOR THERAPY: None  NGS Results: Positive for RUNX1, U2AF1, BCOR, ZRSR2, NRAS, KRAS, and DNMT3A  CURRENT THERAPY: Decitabine and venetoclax  BRIEF ONCOLOGIC HISTORY:  Oncology History  Acute myeloid leukemia not having achieved remission ([facility])  10/30/2021 Initial Diagnosis   Acute myeloid leukemia not having achieved remission ([facility])   11/14/2021 -  Chemotherapy   Patient is on Treatment Plan : AML Decitabine D1-5 q28d       CANCER STAGING:  Cancer Staging  No matching staging information was found for the patient.   INTERVAL HISTORY:  Ms. Pardo 66 y.o. female seen for follow-up of AML and recent left neck and face cellulitis.  She is completing her Levaquin last dose today.  She has already completed Bactrim DS.  She denies any fevers or chills.  Shortness of breath on exertion is stable.  She is having about 2 loose stools per day.    REVIEW OF SYSTEMS:  Review of Systems  Respiratory:  Positive for shortness of breath.   Gastrointestinal:  Positive for diarrhea.  All other systems reviewed and are negative.    PAST MEDICAL/SURGICAL HISTORY:  Past Medical History:  Diagnosis Date   Atrial fibrillation with RVR ([facility]) 06/2013   Breast cancer ([facility]) 2007   left   Breast disorder    cancer   Diabetes mellitus    Type II   Dysrhythmia    Hematuria 01/20/2014   Hypertension    Hyperthyroidism 06/2013   Obesity    PMB (postmenopausal bleeding) 07/24/2012   Had 16.1 mm endometrium on [place] will get endo biopsy   Psoriasis    Urinary frequency 01/20/2014   Past Surgical History:  Procedure Laterality Date   BREAST BIOPSY Left 09/14/2020   Procedure: BREAST BIOPSY;  Surgeon: Aviva Signs, MD;   Location: [facility];  Service: General;  Laterality: Left;   BREAST SURGERY  02/20/06   left side    CESAREAN SECTION  1995   COLONOSCOPY WITH PROPOFOL N/A 10/28/2021   Procedure: COLONOSCOPY WITH PROPOFOL;  Surgeon: Daneil Dolin, MD;  Location: [facility];  Service: Endoscopy;  Laterality: N/A;   ESOPHAGOGASTRODUODENOSCOPY (EGD) WITH PROPOFOL N/A 10/28/2021   Procedure: ESOPHAGOGASTRODUODENOSCOPY (EGD) WITH PROPOFOL;  Surgeon: Daneil Dolin, MD;  Location: [facility];  Service: Endoscopy;  Laterality: N/A;   HYSTEROSCOPY WITH D & C N/A 08/13/2012   Procedure: DILATATION AND CURETTAGE /HYSTEROSCOPY;  Surgeon: Florian Buff, MD;  Location: [facility];  Service: Gynecology;  Laterality: N/A;   SECONDARY CLOSURE OF WOUND Left 02/20/2021   Procedure: SIMPLE WOUND CLOSURE;  Surgeon: Aviva Signs, MD;  Location: [facility];  Service: General;  Laterality: Left;     SOCIAL HISTORY:  Social History   Socioeconomic History   Marital status: Widowed    Spouse name: Not on file   Number of children: Not on file   Years of education: Not on file   Highest education level: Not on file  Occupational History   Not on file  Tobacco Use   Smoking status: Former    Packs/day: 0.01    Years: 1.00    Total pack years: 0.01    Types: Cigarettes    Start date: 03/05/1976  Quit date: 03/06/2003    Years since quitting: 18.8   Smokeless tobacco: Never  Vaping Use   Vaping Use: Never used  Substance and Sexual Activity   Alcohol use: No    Alcohol/week: 0.0 standard drinks of alcohol   Drug use: No   Sexual activity: Not Currently    Birth control/protection: Post-menopausal  Other Topics Concern   Not on file  Social History Narrative   Not on file   Social Determinants of Health   Financial Resource Strain: Not on file  Food Insecurity: No Food Insecurity (12/10/2021)   Hunger Vital Sign    Worried About Running Out of Food in the Last Year: Never true    Ran Out of Food in the Last Year:  Never true  Transportation Needs: No Transportation Needs (12/10/2021)   PRAPARE - Hydrologist (Medical): No    Lack of Transportation (Non-Medical): No  Physical Activity: Not on file  Stress: Not on file  Social Connections: Not on file  Intimate Partner Violence: Not At Risk (12/10/2021)   Humiliation, Afraid, Rape, and Kick questionnaire    Fear of Current or Ex-Partner: No    Emotionally Abused: No    Physically Abused: No    Sexually Abused: No    FAMILY HISTORY:  Family History  Problem Relation Age of Onset   Heart failure Father    CAD Father    Diabetes Sister    Other Sister        blood clots   Breast cancer Sister    Alzheimer's disease Mother    Diabetes Sister    Hypertension Maternal Grandmother    Colon cancer Neg Hx     CURRENT MEDICATIONS:  Outpatient Encounter Medications as of 12/18/2021  Medication Sig Note   acetaminophen (TYLENOL) 500 MG tablet Take 500 mg by mouth every 6 (six) hours as needed. Taken as needed for headache    acyclovir (ZOVIRAX) 400 MG tablet Take 400 mg by mouth 2 (two) times daily.    allopurinol (ZYLOPRIM) 300 MG tablet Take 1 tablet (300 mg total) by mouth daily.    ALPRAZolam (XANAX) 0.5 MG tablet Take 1 tablet by mouth 2 (two) times daily as needed.    furosemide (LASIX) 20 MG tablet Take 1 tablet (20 mg total) by mouth daily as needed.    levofloxacin (LEVAQUIN) 500 MG tablet Take 1 tablet (500 mg total) by mouth daily for 7 days.    MAGNESIUM OXIDE PO Take 266 mg by mouth 3 (three) times daily. 2 tablets TID 12/07/2021: Dose was increased from 1 tablet to 2 tablets TID   metFORMIN (GLUCOPHAGE-XR) 500 MG 24 hr tablet Take 500-1,000 mg by mouth See admin instructions. Take 500 mg by mouth in the morning and 1000 mg at bedtime    metoprolol tartrate (LOPRESSOR) 100 MG tablet Take 1 tablet (100 mg total) by mouth 2 (two) times daily.    posaconazole (NOXAFIL) 100 MG TBEC delayed-release tablet Take 3  tablets (300 mg total) by mouth daily. (Patient not taking: Reported on 12/13/2021)    pravastatin (PRAVACHOL) 40 MG tablet Take 40 mg by mouth at bedtime.    prochlorperazine (COMPAZINE) 10 MG tablet Take 10 mg by mouth as needed. (Patient not taking: Reported on 12/13/2021)    sulfamethoxazole-trimethoprim (BACTRIM DS) 800-160 MG tablet Take 1 tablet by mouth 2 (two) times daily for 7 days.    venetoclax (VENCLEXTA) 100 MG tablet Take 1 tablet (100  mg total) by mouth daily. (Patient not taking: Reported on 12/13/2021)    No facility-administered encounter medications on file as of 12/18/2021.    ALLERGIES:  No Known Allergies   PHYSICAL EXAM:  ECOG Performance status: 1  There were no vitals filed for this visit.  There were no vitals filed for this visit.  Physical Exam Vitals reviewed.  Constitutional:      Appearance: Normal appearance.  HENT:     Mouth/Throat:     Pharynx: No oropharyngeal exudate.  Cardiovascular:     Rate and Rhythm: Normal rate and regular rhythm.     Heart sounds: Normal heart sounds.  Pulmonary:     Breath sounds: Normal breath sounds.  Abdominal:     Palpations: Abdomen is soft. There is no mass.  Neurological:     General: No focal deficit present.     Mental Status: She is alert and oriented to person, place, and time.  Psychiatric:        Mood and Affect: Mood normal.        Behavior: Behavior normal.     LABORATORY DATA:  I have reviewed the labs as listed.  CBC    Component Value Date/Time   WBC 0.2 (LL) 12/13/2021 1044   RBC 2.79 (L) 12/13/2021 1044   HGB 8.1 (L) 12/13/2021 1044   HCT 25.3 (L) 12/13/2021 1044   PLT 269 12/13/2021 1044   MCV 90.7 12/13/2021 1044   MCH 29.0 12/13/2021 1044   MCHC 32.0 12/13/2021 1044   RDW 15.5 12/13/2021 1044   LYMPHSABS 0.2 (L) 12/13/2021 1044   MONOABS 0.0 (L) 12/13/2021 1044   EOSABS 0.0 12/13/2021 1044   BASOSABS 0.0 12/13/2021 1044      Latest Ref Rng & Units 12/13/2021   10:44 AM  12/11/2021    6:05 AM 12/10/2021    5:29 AM  CMP  Glucose 70 - 99 mg/dL 112  123  123   BUN 8 - 23 mg/dL 16  16  15    Creatinine 0.44 - 1.00 mg/dL 0.94  0.84  0.76   Sodium 135 - 145 mmol/L 139  139  135   Potassium 3.5 - 5.1 mmol/L 4.1  3.8  3.6   Chloride 98 - 111 mmol/L 110  110  107   CO2 22 - 32 mmol/L 22  23  22    Calcium 8.9 - 10.3 mg/dL 8.7  8.4  8.0   Total Protein 6.5 - 8.1 g/dL 6.7     Total Bilirubin 0.3 - 1.2 mg/dL 0.8     Alkaline Phos 38 - 126 U/L 92     AST 15 - 41 U/L 24     ALT 0 - 44 U/L 21       DIAGNOSTIC IMAGING:  I have independently reviewed the scans and discussed with the patient.  ASSESSMENT:  1.  Acute myeloid leukemia (Dx 11/03/2021): - Presentation to the hospital on 10/26/2021 with hemoglobin 4.7 and platelet count 71. - BMBX (11/03/2021): Hypercellular (80%) with increased blasts (20-25% by CD34).  Background hematopoiesis demonstrates trilineage dysplasia. - Chromosomes: 60, XX, +14[12]/48, XX, +8, +14[8].  Trisomy 14 commonly associated with MDS and t-MDS.  Trisomy 8 associated with AML, MDS and MPD. - FISH: Trisomy 8, - NGS: Positive for RUNX1, U2AF1, BCOR, ZRSR2, NRAS, KRAS, and DNMT3A. - Cycle 1 of decitabine (20 mg/m2) x5 days and venetoclax 100 mg daily (while taking posaconazole) or 200 mg daily (while not taking posaconazole but on diltiazem)  on 11/14/2021.  2.  Social/family history: - She lives at home with her boyfriend.  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  No major chemical exposure.  Quit smoking 28 years ago.  Sister has breast cancer.  3.  Stage I left breast cancer: - Diagnosed 10 years ago, treated with lumpectomy followed by 6 weeks of radiation.  No chemotherapy.  Took antiestrogen therapy for 5 years.   PLAN:  1.  Acute myeloid leukemia: -Her left neck and the side of the face looks better today. - However she has hard area in front of the left tragus which is mildly tender.  It is not fluctuant.  We will  closely monitor it for any progression. - I have reviewed labs today which showed normal LFTs.  However creatinine has increased to 1.48, likely from Bactrim.  She will receive 1 L normal saline today. - CBC showed hemoglobin 7.7 and platelet count normal.  White count is 0.4 with ANC of 0.2. - Recommend that she proceed with her next cycle of decitabine and venetoclax starting today.  She will start venetoclax 100 mg daily.  She will restart posaconazole.  We will continue to monitor twice weekly labs.  RTC 4 weeks for follow-up.  2.  Hypomagnesemia: - Continue magnesium 3 times daily.  Magnesium is normal today.  3.  Diarrhea: - She has chronic diarrhea since admission to the hospital from August 2023. - Repeat C. difficile was negative. - She is having 2 watery stools per day.  Try Imodium as needed.  4.  Leg swellings: - Continue Lasix 20 mg in the mornings as needed.      Orders placed this encounter:  No orders of the defined types were placed in this encounter.     Derek Jack, MD [place] [phone]

## 2021-12-18 NOTE — Progress Notes (Signed)
Labs reviewed today with Dr. Delton Coombes, ok to treat today per MD. Critical values noted by MD. Will give two units of blood per orders.   Treatment given per orders. Patient tolerated it well without problems. Vitals stable and discharged home from clinic ambulatory. Follow up as scheduled.

## 2021-12-19 ENCOUNTER — Inpatient Hospital Stay: Payer: PPO

## 2021-12-19 ENCOUNTER — Other Ambulatory Visit: Payer: Self-pay | Admitting: *Deleted

## 2021-12-19 ENCOUNTER — Ambulatory Visit: Payer: 59 | Admitting: Cardiology

## 2021-12-19 VITALS — BP 99/79 | HR 91 | Temp 97.4°F | Resp 18

## 2021-12-19 DIAGNOSIS — Z5111 Encounter for antineoplastic chemotherapy: Secondary | ICD-10-CM | POA: Diagnosis not present

## 2021-12-19 DIAGNOSIS — C9200 Acute myeloblastic leukemia, not having achieved remission: Secondary | ICD-10-CM

## 2021-12-19 MED ORDER — DIPHENHYDRAMINE HCL 25 MG PO CAPS
25.0000 mg | ORAL_CAPSULE | Freq: Once | ORAL | Status: AC
  Administered 2021-12-19: 25 mg via ORAL
  Filled 2021-12-19: qty 1

## 2021-12-19 MED ORDER — SODIUM CHLORIDE 0.9 % IV SOLN: Freq: Once | INTRAVENOUS | Status: AC

## 2021-12-19 MED ORDER — SODIUM CHLORIDE 0.9 % IV SOLN
20.0000 mg/m2 | Freq: Once | INTRAVENOUS | Status: AC
  Administered 2021-12-19: 45 mg via INTRAVENOUS
  Filled 2021-12-19: qty 9

## 2021-12-19 MED ORDER — LORAZEPAM 0.5 MG PO TABS: 0.5000 mg | ORAL_TABLET | Freq: Two times a day (BID) | ORAL | 0 refills | Status: AC | PRN

## 2021-12-19 MED ORDER — ACETAMINOPHEN 325 MG PO TABS
650.0000 mg | ORAL_TABLET | Freq: Once | ORAL | Status: AC
  Administered 2021-12-19: 650 mg via ORAL
  Filled 2021-12-19: qty 2

## 2021-12-19 MED ORDER — SODIUM CHLORIDE 0.9% IV SOLUTION
250.0000 mL | Freq: Once | INTRAVENOUS | Status: AC
  Administered 2021-12-19: 250 mL via INTRAVENOUS

## 2021-12-19 MED ORDER — HEPARIN SOD (PORK) LOCK FLUSH 100 UNIT/ML IV SOLN
250.0000 [IU] | Freq: Once | INTRAVENOUS | Status: AC | PRN
  Administered 2021-12-19: 250 [IU]

## 2021-12-19 MED ORDER — SODIUM CHLORIDE 0.9% FLUSH
10.0000 mL | INTRAVENOUS | Status: DC | PRN
  Administered 2021-12-19: 10 mL

## 2021-12-19 MED ORDER — LORAZEPAM 2 MG/ML IJ SOLN
0.5000 mg | Freq: Once | INTRAMUSCULAR | Status: AC
  Administered 2021-12-19: 0.5 mg via INTRAVENOUS
  Filled 2021-12-19: qty 1

## 2021-12-19 NOTE — Patient Instructions (Signed)
[facility]  Discharge Instructions: Thank you for choosing [facility] to provide your oncology and hematology care.  If you have a lab appointment with the [facility], please come in thru the Main Entrance and check in at the main information desk.  Wear comfortable clothing and clothing appropriate for easy access to any Portacath or PICC line.   We strive to give you quality time with your provider. You may need to reschedule your appointment if you arrive late (15 or more minutes).  Arriving late affects you and other patients whose appointments are after yours.  Also, if you miss three or more appointments without notifying the office, you may be dismissed from the clinic at the provider's discretion.      For prescription refill requests, have your pharmacy contact our office and allow 72 hours for refills to be completed.    Today you received the following chemotherapy, decitabine   To help prevent nausea and vomiting after your treatment, we encourage you to take your nausea medication as directed.  BELOW ARE SYMPTOMS THAT SHOULD BE REPORTED IMMEDIATELY: *FEVER GREATER THAN 100.4 F (38 C) OR HIGHER *CHILLS OR SWEATING *NAUSEA AND VOMITING THAT IS NOT CONTROLLED WITH YOUR NAUSEA MEDICATION *UNUSUAL SHORTNESS OF BREATH *UNUSUAL BRUISING OR BLEEDING *URINARY PROBLEMS (pain or burning when urinating, or frequent urination) *BOWEL PROBLEMS (unusual diarrhea, constipation, pain near the anus) TENDERNESS IN MOUTH AND THROAT WITH OR WITHOUT PRESENCE OF ULCERS (sore throat, sores in mouth, or a toothache) UNUSUAL RASH, SWELLING OR PAIN  UNUSUAL VAGINAL DISCHARGE OR ITCHING   Items with * indicate a potential emergency and should be followed up as soon as possible or go to the Emergency Department if any problems should occur.  Please show the CHEMOTHERAPY ALERT CARD or IMMUNOTHERAPY ALERT CARD at check-in to the Emergency Department and triage  nurse.  Should you have questions after your visit or need to cancel or reschedule your appointment, please contact [facility] [phone]  and follow the prompts.  Office hours are 8:00 a.m. to 4:30 p.m. Monday - Friday. Please note that voicemails left after 4:00 p.m. may not be returned until the following business day.  We are closed weekends and major holidays. You have access to a nurse at all times for urgent questions. Please call the main number to the clinic [phone] and follow the prompts.  For any non-urgent questions, you may also contact your provider using MyChart. We now offer e-Visits for anyone 55 and older to request care online for non-urgent symptoms. For details visit [email or web address].   Also download the MyChart app! Go to the app store, search "MyChart", open the app, select [facility], and log in with your MyChart username and password.  Masks are optional in the cancer centers. If you would like for your care team to wear a mask while they are taking care of you, please let them know. You may have one support person who is at least 65 years old accompany you for your appointments.

## 2021-12-19 NOTE — Progress Notes (Signed)
Treatment given per orders. One unit of blood given today per orders. Patient tolerated it well without problems. Vitals stable and discharged home from clinic via wheelchair.Follow up as scheduled.

## 2021-12-20 ENCOUNTER — Inpatient Hospital Stay: Payer: PPO

## 2021-12-20 VITALS — BP 122/80 | HR 104 | Temp 97.2°F | Resp 18

## 2021-12-20 DIAGNOSIS — C9200 Acute myeloblastic leukemia, not having achieved remission: Secondary | ICD-10-CM

## 2021-12-20 DIAGNOSIS — Z5111 Encounter for antineoplastic chemotherapy: Secondary | ICD-10-CM | POA: Diagnosis not present

## 2021-12-20 MED ORDER — PALONOSETRON HCL INJECTION 0.25 MG/5ML
0.2500 mg | Freq: Once | INTRAVENOUS | Status: AC
  Administered 2021-12-20: 0.25 mg via INTRAVENOUS
  Filled 2021-12-20: qty 5

## 2021-12-20 MED ORDER — SODIUM CHLORIDE 0.9% IV SOLUTION
250.0000 mL | Freq: Once | INTRAVENOUS | Status: AC
  Administered 2021-12-20: 250 mL via INTRAVENOUS

## 2021-12-20 MED ORDER — SODIUM CHLORIDE 0.9 % IV SOLN
20.0000 mg/m2 | Freq: Once | INTRAVENOUS | Status: AC
  Administered 2021-12-20: 45 mg via INTRAVENOUS
  Filled 2021-12-20: qty 9

## 2021-12-20 MED ORDER — ACETAMINOPHEN 325 MG PO TABS
650.0000 mg | ORAL_TABLET | Freq: Once | ORAL | Status: AC
  Administered 2021-12-20: 650 mg via ORAL
  Filled 2021-12-20: qty 2

## 2021-12-20 MED ORDER — SODIUM CHLORIDE 0.9% IV SOLUTION: 250.0000 mL | Freq: Once | INTRAVENOUS | Status: AC

## 2021-12-20 MED ORDER — DIPHENHYDRAMINE HCL 25 MG PO CAPS
25.0000 mg | ORAL_CAPSULE | Freq: Once | ORAL | Status: AC
  Administered 2021-12-20: 25 mg via ORAL
  Filled 2021-12-20: qty 1

## 2021-12-20 MED ORDER — SODIUM CHLORIDE 0.9 % IV SOLN: Freq: Once | INTRAVENOUS | Status: AC

## 2021-12-20 MED ORDER — HEPARIN SOD (PORK) LOCK FLUSH 100 UNIT/ML IV SOLN
500.0000 [IU] | Freq: Once | INTRAVENOUS | Status: AC | PRN
  Administered 2021-12-20: 500 [IU]

## 2021-12-20 MED ORDER — SODIUM CHLORIDE 0.9% FLUSH
10.0000 mL | INTRAVENOUS | Status: AC | PRN
  Administered 2021-12-20: 10 mL

## 2021-12-20 NOTE — Progress Notes (Unsigned)
Patient presents today for Decitabine infusion and one unit of PRBC.  Patient is in satisfactory condition with no new complaints voiced.  Vital signs are stable.  We will proceed with infusion and transfusion per MD orders.   Patient tolerated treatment and transfusion well with no complaints voiced.  PICC dressing changed today.  Patient left via wheelchair in stable condition.  Vital signs stable at discharge.  Follow up as scheduled.

## 2021-12-20 NOTE — Patient Instructions (Signed)
[facility]  Discharge Instructions: Thank you for choosing [facility] to provide your oncology and hematology care.  If you have a lab appointment with the Cancer Center, please come in thru the Main Entrance and check in at the main information desk.  Wear comfortable clothing and clothing appropriate for easy access to any Portacath or PICC line.   We strive to give you quality time with your provider. You may need to reschedule your appointment if you arrive late (15 or more minutes).  Arriving late affects you and other patients whose appointments are after yours.  Also, if you miss three or more appointments without notifying the office, you may be dismissed from the clinic at the provider's discretion.      For prescription refill requests, have your pharmacy contact our office and allow 72 hours for refills to be completed.    Today you received the following chemotherapy and/or immunotherapy agents Decitabine.  Decitabine Injection What is this medication? DECITABINE (dee SYE ta been) treats blood and bone marrow cancers. It works by slowing down the growth of cancer cells. This medicine may be used for other purposes; ask your health care provider or pharmacist if you have questions. COMMON BRAND NAME(S): Dacogen What should I tell my care team before I take this medication? They need to know if you have any of these conditions: Infection Kidney disease Liver disease An unusual or allergic reaction to decitabine, other medications, foods, dyes, or preservatives Pregnant or trying to get pregnant Breast-feeding How should I use this medication? This medication is infused into a vein. It is given by your care team in a hospital or clinic setting. Talk to your care team about the use of this medication in children. Special care may be needed. Overdosage: If you think you have taken too much of this medicine contact a poison control center or  emergency room at once. NOTE: This medicine is only for you. Do not share this medicine with others. What if I miss a dose? Keep appointments for follow-up doses. It is important not to miss your dose. Call your care team if you are unable to keep an appointment. What may interact with this medication? Interactions are not expected. This list may not describe all possible interactions. Give your health care provider a list of all the medicines, herbs, non-prescription drugs, or dietary supplements you use. Also tell them if you smoke, drink alcohol, or use illegal drugs. Some items may interact with your medicine. What should I watch for while using this medication? Visit your care team for regular checks on your progress. You may need blood work while taking this medication. This medication may make you feel generally unwell. This is not uncommon as chemotherapy can affect healthy cells as well as cancer cells. Report any side effects. Continue your course of treatment even though you feel ill unless your care team tells you to stop. This medication may increase your risk of getting an infection. Call your care team for advice if you get a fever, chills, sore throat, or other symptoms of a cold or flu. Do not treat yourself. Try to avoid being around people who are sick. Be careful brushing or flossing your teeth or using a toothpick because you may get an infection or bleed more easily. If you have any dental work done, tell your dentist you are receiving this medication. Call your care team if you are around anyone with measles, chickenpox, or   if you develop sores or blisters that do not heal properly. Avoid taking medications that contain aspirin, acetaminophen, ibuprofen, naproxen, or ketoprofen unless instructed by your care team. These medications may hide a fever. Talk to your care team if you or your partner wish to become pregnant or think either of you might be pregnant. This medication can  cause serious birth defects if taken during pregnancy or for 6 months after the last dose. A negative pregnancy test is required before starting this medication. A reliable form of contraception is recommended while taking this medication and for 6 months after the last dose. Talk to your care team about reliable forms of contraception. Do not father a child while taking this medication or for 3 months after the last dose. Use a condom while having sex during this time period. Do not breast-feed while taking this medication and for 2 weeks after the last dose. This medication may cause infertility. Talk to your care team if you are concerned about your fertility. What side effects may I notice from receiving this medication? Side effects that you should report to your care team as soon as possible: Allergic reactions--skin rash, itching, hives, swelling of the face, lips, tongue, or throat Infection--fever, chills, cough, sore throat, wounds that don't heal, pain or trouble when passing urine, general feeling of discomfort or being unwell Low red blood cell level--unusual weakness or fatigue, dizziness, headache, trouble breathing Unusual bruising or bleeding Side effects that usually do not require medical attention (report to your care team if they continue or are bothersome): Constipation Diarrhea Fatigue Nausea Pain, redness, or swelling with sores inside the mouth or throat Stomach pain This list may not describe all possible side effects. Call your doctor for medical advice about side effects. You may report side effects to FDA at 1-800-FDA-1088. Where should I keep my medication? This medication is given in a hospital or clinic. It will not be stored at home. NOTE: This sheet is a summary. It may not cover all possible information. If you have questions about this medicine, talk to your doctor, pharmacist, or health care provider.  2023 Elsevier/Gold Standard (2021-05-29 00:00:00)         To help prevent nausea and vomiting after your treatment, we encourage you to take your nausea medication as directed.  BELOW ARE SYMPTOMS THAT SHOULD BE REPORTED IMMEDIATELY: *FEVER GREATER THAN 100.4 F (38 C) OR HIGHER *CHILLS OR SWEATING *NAUSEA AND VOMITING THAT IS NOT CONTROLLED WITH YOUR NAUSEA MEDICATION *UNUSUAL SHORTNESS OF BREATH *UNUSUAL BRUISING OR BLEEDING *URINARY PROBLEMS (pain or burning when urinating, or frequent urination) *BOWEL PROBLEMS (unusual diarrhea, constipation, pain near the anus) TENDERNESS IN MOUTH AND THROAT WITH OR WITHOUT PRESENCE OF ULCERS (sore throat, sores in mouth, or a toothache) UNUSUAL RASH, SWELLING OR PAIN  UNUSUAL VAGINAL DISCHARGE OR ITCHING   Items with * indicate a potential emergency and should be followed up as soon as possible or go to the Emergency Department if any problems should occur.  Please show the CHEMOTHERAPY ALERT CARD or IMMUNOTHERAPY ALERT CARD at check-in to the Emergency Department and triage nurse.  Should you have questions after your visit or need to cancel or reschedule your appointment, please contact [facility] [phone]  and follow the prompts.  Office hours are 8:00 a.m. to 4:30 p.m. Monday - Friday. Please note that voicemails left after 4:00 p.m. may not be returned until the following business day.  We are closed weekends and major   holidays. You have access to a nurse at all times for urgent questions. Please call the main number to the clinic [phone] and follow the prompts.  For any non-urgent questions, you may also contact your provider using MyChart. We now offer e-Visits for anyone 18 and older to request care online for non-urgent symptoms. For details visit [email or web address].   Also download the MyChart app! Go to the app store, search "MyChart", open the app, select [facility], and log in with your MyChart username and password.  Masks are optional in the cancer  centers. If you would like for your care team to wear a mask while they are taking care of you, please let them know. You may have one support person who is at least 65 years old accompany you for your appointments.  

## 2021-12-21 ENCOUNTER — Inpatient Hospital Stay: Payer: PPO

## 2021-12-21 VITALS — BP 113/73 | HR 93 | Temp 97.0°F | Resp 18

## 2021-12-21 DIAGNOSIS — C9200 Acute myeloblastic leukemia, not having achieved remission: Secondary | ICD-10-CM

## 2021-12-21 DIAGNOSIS — R799 Abnormal finding of blood chemistry, unspecified: Secondary | ICD-10-CM

## 2021-12-21 DIAGNOSIS — Z5111 Encounter for antineoplastic chemotherapy: Secondary | ICD-10-CM | POA: Diagnosis not present

## 2021-12-21 DIAGNOSIS — Z452 Encounter for adjustment and management of vascular access device: Secondary | ICD-10-CM

## 2021-12-21 LAB — CBC WITH DIFFERENTIAL/PLATELET
Abs Immature Granulocytes: 0.01 10*3/uL (ref 0.00–0.07)
Basophils Absolute: 0 10*3/uL (ref 0.0–0.1)
Basophils Relative: 0 %
Eosinophils Absolute: 0 10*3/uL (ref 0.0–0.5)
Eosinophils Relative: 0 %
HCT: 30.2 % — ABNORMAL LOW (ref 36.0–46.0)
Hemoglobin: 9.7 g/dL — ABNORMAL LOW (ref 12.0–15.0)
Immature Granulocytes: 2 %
Lymphocytes Relative: 27 %
Lymphs Abs: 0.2 10*3/uL — ABNORMAL LOW (ref 0.7–4.0)
MCH: 28.9 pg (ref 26.0–34.0)
MCHC: 32.1 g/dL (ref 30.0–36.0)
MCV: 89.9 fL (ref 80.0–100.0)
Monocytes Absolute: 0.1 10*3/uL (ref 0.1–1.0)
Monocytes Relative: 12 %
Neutro Abs: 0.4 10*3/uL — CL (ref 1.7–7.7)
Neutrophils Relative %: 59 %
Platelets: 190 10*3/uL (ref 150–400)
RBC: 3.36 MIL/uL — ABNORMAL LOW (ref 3.87–5.11)
RDW: 16.3 % — ABNORMAL HIGH (ref 11.5–15.5)
WBC: 0.7 10*3/uL — CL (ref 4.0–10.5)
nRBC: 0 % (ref 0.0–0.2)

## 2021-12-21 LAB — BPAM RBC
Blood Product Expiration Date: 202311092359
Blood Product Expiration Date: 202311132359
ISSUE DATE / TIME: 202310171311
ISSUE DATE / TIME: 202310181240
Unit Type and Rh: 5100
Unit Type and Rh: 6200

## 2021-12-21 LAB — TYPE AND SCREEN
ABO/RH(D): A POS
Antibody Screen: NEGATIVE
Unit division: 0
Unit division: 0

## 2021-12-21 LAB — COMPREHENSIVE METABOLIC PANEL
ALT: 13 U/L (ref 0–44)
AST: 20 U/L (ref 15–41)
Albumin: 3 g/dL — ABNORMAL LOW (ref 3.5–5.0)
Alkaline Phosphatase: 72 U/L (ref 38–126)
Anion gap: 5 (ref 5–15)
BUN: 23 mg/dL (ref 8–23)
CO2: 21 mmol/L — ABNORMAL LOW (ref 22–32)
Calcium: 9.1 mg/dL (ref 8.9–10.3)
Chloride: 111 mmol/L (ref 98–111)
Creatinine, Ser: 1.44 mg/dL — ABNORMAL HIGH (ref 0.44–1.00)
GFR, Estimated: 40 mL/min — ABNORMAL LOW (ref >60)
Glucose, Bld: 95 mg/dL (ref 70–99)
Potassium: 5.5 mmol/L — ABNORMAL HIGH (ref 3.5–5.1)
Sodium: 137 mmol/L (ref 135–145)
Total Bilirubin: 1.1 mg/dL (ref 0.3–1.2)
Total Protein: 6.5 g/dL (ref 6.5–8.1)

## 2021-12-21 LAB — SAMPLE TO BLOOD BANK

## 2021-12-21 LAB — MAGNESIUM: Magnesium: 1.6 mg/dL — ABNORMAL LOW (ref 1.7–2.4)

## 2021-12-21 MED ORDER — SODIUM CHLORIDE 0.9% FLUSH
10.0000 mL | INTRAVENOUS | Status: DC | PRN
  Administered 2021-12-21: 10 mL

## 2021-12-21 MED ORDER — SODIUM CHLORIDE 0.9 % IV SOLN
20.0000 mg/m2 | Freq: Once | INTRAVENOUS | Status: AC
  Administered 2021-12-21: 45 mg via INTRAVENOUS
  Filled 2021-12-21: qty 9

## 2021-12-21 MED ORDER — SODIUM CHLORIDE 0.9% FLUSH
10.0000 mL | Freq: Once | INTRAVENOUS | Status: AC
  Administered 2021-12-21: 10 mL via INTRAVENOUS

## 2021-12-21 MED ORDER — SODIUM CHLORIDE 0.9 % IV SOLN: Freq: Once | INTRAVENOUS | Status: AC

## 2021-12-21 MED ORDER — HEPARIN SOD (PORK) LOCK FLUSH 100 UNIT/ML IV SOLN
250.0000 [IU] | Freq: Once | INTRAVENOUS | Status: AC
  Administered 2021-12-21: 250 [IU] via INTRAVENOUS

## 2021-12-21 MED ORDER — HEPARIN SOD (PORK) LOCK FLUSH 100 UNIT/ML IV SOLN
250.0000 [IU] | Freq: Once | INTRAVENOUS | Status: AC | PRN
  Administered 2021-12-21: 250 [IU]

## 2021-12-21 NOTE — Progress Notes (Signed)
Patient tolerated chemotherapy with no complaints voiced. Side effects with management reviewed understanding verbalized.PICC site clean and dry with no bruising or swelling noted at site. Good blood return noted before and after administration of chemotherapy. Patient left in satisfactory condition with VSS and no s/s of distress noted. 

## 2021-12-21 NOTE — Patient Instructions (Signed)
[place]  Discharge Instructions: Thank you for choosing [facility] to provide your oncology and hematology care.  If you have a lab appointment with the [facility], please come in thru the Main Entrance and check in at the main information desk.  Wear comfortable clothing and clothing appropriate for easy access to any Portacath or PICC line.   We strive to give you quality time with your provider. You may need to reschedule your appointment if you arrive late (15 or more minutes).  Arriving late affects you and other patients whose appointments are after yours.  Also, if you miss three or more appointments without notifying the office, you may be dismissed from the clinic at the provider's discretion.      For prescription refill requests, have your pharmacy contact our office and allow 72 hours for refills to be completed.    Today you received the following chemotherapy and/or immunotherapy agents Decitabine and labs drawn, return as scheduled.   To help prevent nausea and vomiting after your treatment, we encourage you to take your nausea medication as directed.  BELOW ARE SYMPTOMS THAT SHOULD BE REPORTED IMMEDIATELY: *FEVER GREATER THAN 100.4 F (38 C) OR HIGHER *CHILLS OR SWEATING *NAUSEA AND VOMITING THAT IS NOT CONTROLLED WITH YOUR NAUSEA MEDICATION *UNUSUAL SHORTNESS OF BREATH *UNUSUAL BRUISING OR BLEEDING *URINARY PROBLEMS (pain or burning when urinating, or frequent urination) *BOWEL PROBLEMS (unusual diarrhea, constipation, pain near the anus) TENDERNESS IN MOUTH AND THROAT WITH OR WITHOUT PRESENCE OF ULCERS (sore throat, sores in mouth, or a toothache) UNUSUAL RASH, SWELLING OR PAIN  UNUSUAL VAGINAL DISCHARGE OR ITCHING   Items with * indicate a potential emergency and should be followed up as soon as possible or go to the Emergency Department if any problems should occur.  Please show the CHEMOTHERAPY ALERT CARD or IMMUNOTHERAPY  ALERT CARD at check-in to the Emergency Department and triage nurse.  Should you have questions after your visit or need to cancel or reschedule your appointment, please contact [facility] [phone]  and follow the prompts.  Office hours are 8:00 a.m. to 4:30 p.m. Monday - Friday. Please note that voicemails left after 4:00 p.m. may not be returned until the following business day.  We are closed weekends and major holidays. You have access to a nurse at all times for urgent questions. Please call the main number to the clinic [phone] and follow the prompts.  For any non-urgent questions, you may also contact your provider using MyChart. We now offer e-Visits for anyone 26 and older to request care online for non-urgent symptoms. For details visit [email or web address].   Also download the MyChart app! Go to the app store, search "MyChart", open the app, select [facility], and log in with your MyChart username and password.  Masks are optional in the cancer centers. If you would like for your care team to wear a mask while they are taking care of you, please let them know. You may have one support person who is at least 64 years old accompany you for your appointments.

## 2021-12-21 NOTE — Progress Notes (Signed)
CRITICAL VALUE STICKER  CRITICAL VALUE: WBC 0.7 and ANC 0.4   RECEIVER (on-site recipient of call):[place] rn  DATE & TIME NOTIFIED: 12/21/2021 at 1339  MESSENGER (representative from lab):Suzanne [place] Silos Lab tech  MD NOTIFIED: Dede Query at [facility]: No orders received.  Patient is a [facility] patient.  Labs faxed to [facility].

## 2021-12-22 ENCOUNTER — Inpatient Hospital Stay: Payer: PPO

## 2021-12-22 VITALS — BP 117/76 | HR 75 | Temp 97.6°F | Resp 17

## 2021-12-22 DIAGNOSIS — C9200 Acute myeloblastic leukemia, not having achieved remission: Secondary | ICD-10-CM

## 2021-12-22 DIAGNOSIS — Z5111 Encounter for antineoplastic chemotherapy: Secondary | ICD-10-CM | POA: Diagnosis not present

## 2021-12-22 MED ORDER — SODIUM CHLORIDE 0.9% FLUSH
10.0000 mL | INTRAVENOUS | Status: DC | PRN
  Administered 2021-12-22: 10 mL

## 2021-12-22 MED ORDER — SODIUM CHLORIDE 0.9 % IV SOLN
20.0000 mg/m2 | Freq: Once | INTRAVENOUS | Status: AC
  Administered 2021-12-22: 45 mg via INTRAVENOUS
  Filled 2021-12-22: qty 9

## 2021-12-22 MED ORDER — PALONOSETRON HCL INJECTION 0.25 MG/5ML
0.2500 mg | Freq: Once | INTRAVENOUS | Status: AC
  Administered 2021-12-22: 0.25 mg via INTRAVENOUS
  Filled 2021-12-22: qty 5

## 2021-12-22 MED ORDER — HEPARIN SOD (PORK) LOCK FLUSH 100 UNIT/ML IV SOLN
250.0000 [IU] | Freq: Once | INTRAVENOUS | Status: AC | PRN
  Administered 2021-12-22: 250 [IU]

## 2021-12-22 MED ORDER — SODIUM CHLORIDE 0.9 % IV SOLN: Freq: Once | INTRAVENOUS | Status: AC

## 2021-12-22 NOTE — Progress Notes (Signed)
Treatment given per orders. Patient tolerated it well without problems. Vitals stable and discharged home from clinic via wheelchair Follow up as scheduled.  

## 2021-12-22 NOTE — Patient Instructions (Signed)
[facility]  Discharge Instructions: Thank you for choosing [facility] to provide your oncology and hematology care.  If you have a lab appointment with the [facility], please come in thru the Main Entrance and check in at the main information desk.  Wear comfortable clothing and clothing appropriate for easy access to any Portacath or PICC line.   We strive to give you quality time with your provider. You may need to reschedule your appointment if you arrive late (15 or more minutes).  Arriving late affects you and other patients whose appointments are after yours.  Also, if you miss three or more appointments without notifying the office, you may be dismissed from the clinic at the provider's discretion.      For prescription refill requests, have your pharmacy contact our office and allow 72 hours for refills to be completed.    Today you received the following chemotherapy and/or immunotherapy agents, decitabine    To help prevent nausea and vomiting after your treatment, we encourage you to take your nausea medication as directed.  BELOW ARE SYMPTOMS THAT SHOULD BE REPORTED IMMEDIATELY: *FEVER GREATER THAN 100.4 F (38 C) OR HIGHER *CHILLS OR SWEATING *NAUSEA AND VOMITING THAT IS NOT CONTROLLED WITH YOUR NAUSEA MEDICATION *UNUSUAL SHORTNESS OF BREATH *UNUSUAL BRUISING OR BLEEDING *URINARY PROBLEMS (pain or burning when urinating, or frequent urination) *BOWEL PROBLEMS (unusual diarrhea, constipation, pain near the anus) TENDERNESS IN MOUTH AND THROAT WITH OR WITHOUT PRESENCE OF ULCERS (sore throat, sores in mouth, or a toothache) UNUSUAL RASH, SWELLING OR PAIN  UNUSUAL VAGINAL DISCHARGE OR ITCHING   Items with * indicate a potential emergency and should be followed up as soon as possible or go to the Emergency Department if any problems should occur.  Please show the CHEMOTHERAPY ALERT CARD or IMMUNOTHERAPY ALERT CARD at check-in to the  Emergency Department and triage nurse.  Should you have questions after your visit or need to cancel or reschedule your appointment, please contact [facility] [phone]  and follow the prompts.  Office hours are 8:00 a.m. to 4:30 p.m. Monday - Friday. Please note that voicemails left after 4:00 p.m. may not be returned until the following business day.  We are closed weekends and major holidays. You have access to a nurse at all times for urgent questions. Please call the main number to the clinic [phone] and follow the prompts.  For any non-urgent questions, you may also contact your provider using MyChart. We now offer e-Visits for anyone 86 and older to request care online for non-urgent symptoms. For details visit [email or web address].   Also download the MyChart app! Go to the app store, search "MyChart", open the app, select [facility], and log in with your MyChart username and password.  Masks are optional in the cancer centers. If you would like for your care team to wear a mask while they are taking care of you, please let them know. You may have one support person who is at least 65 years old accompany you for your appointments.

## 2021-12-25 ENCOUNTER — Inpatient Hospital Stay: Payer: PPO

## 2021-12-25 ENCOUNTER — Other Ambulatory Visit: Payer: Self-pay

## 2021-12-25 DIAGNOSIS — Z5111 Encounter for antineoplastic chemotherapy: Secondary | ICD-10-CM | POA: Diagnosis not present

## 2021-12-25 DIAGNOSIS — R799 Abnormal finding of blood chemistry, unspecified: Secondary | ICD-10-CM

## 2021-12-25 LAB — COMPREHENSIVE METABOLIC PANEL
ALT: 13 U/L (ref 0–44)
AST: 24 U/L (ref 15–41)
Albumin: 3.3 g/dL — ABNORMAL LOW (ref 3.5–5.0)
Alkaline Phosphatase: 62 U/L (ref 38–126)
Anion gap: 8 (ref 5–15)
BUN: 19 mg/dL (ref 8–23)
CO2: 24 mmol/L (ref 22–32)
Calcium: 9 mg/dL (ref 8.9–10.3)
Chloride: 107 mmol/L (ref 98–111)
Creatinine, Ser: 1.23 mg/dL — ABNORMAL HIGH (ref 0.44–1.00)
GFR, Estimated: 49 mL/min — ABNORMAL LOW (ref >60)
Glucose, Bld: 97 mg/dL (ref 70–99)
Potassium: 4.6 mmol/L (ref 3.5–5.1)
Sodium: 139 mmol/L (ref 135–145)
Total Bilirubin: 1 mg/dL (ref 0.3–1.2)
Total Protein: 6.6 g/dL (ref 6.5–8.1)

## 2021-12-25 LAB — CBC WITH DIFFERENTIAL/PLATELET
Abs Immature Granulocytes: 0.01 10*3/uL (ref 0.00–0.07)
Basophils Absolute: 0 10*3/uL (ref 0.0–0.1)
Basophils Relative: 0 %
Eosinophils Absolute: 0 10*3/uL (ref 0.0–0.5)
Eosinophils Relative: 0 %
HCT: 30.4 % — ABNORMAL LOW (ref 36.0–46.0)
Hemoglobin: 9.7 g/dL — ABNORMAL LOW (ref 12.0–15.0)
Immature Granulocytes: 1 %
Lymphocytes Relative: 19 %
Lymphs Abs: 0.2 10*3/uL — ABNORMAL LOW (ref 0.7–4.0)
MCH: 29.2 pg (ref 26.0–34.0)
MCHC: 31.9 g/dL (ref 30.0–36.0)
MCV: 91.6 fL (ref 80.0–100.0)
Monocytes Absolute: 0.1 10*3/uL (ref 0.1–1.0)
Monocytes Relative: 8 %
Neutro Abs: 0.8 10*3/uL — ABNORMAL LOW (ref 1.7–7.7)
Neutrophils Relative %: 72 %
Platelets: 134 10*3/uL — ABNORMAL LOW (ref 150–400)
RBC: 3.32 MIL/uL — ABNORMAL LOW (ref 3.87–5.11)
RDW: 16.1 % — ABNORMAL HIGH (ref 11.5–15.5)
WBC: 1.1 10*3/uL — CL (ref 4.0–10.5)
nRBC: 0 % (ref 0.0–0.2)

## 2021-12-25 LAB — SAMPLE TO BLOOD BANK

## 2021-12-25 LAB — MAGNESIUM: Magnesium: 1.2 mg/dL — ABNORMAL LOW (ref 1.7–2.4)

## 2021-12-25 MED ORDER — HEPARIN SOD (PORK) LOCK FLUSH 100 UNIT/ML IV SOLN
500.0000 [IU] | Freq: Once | INTRAVENOUS | Status: AC
  Administered 2021-12-25: 500 [IU] via INTRAVENOUS

## 2021-12-25 MED ORDER — SODIUM CHLORIDE 0.9% FLUSH
10.0000 mL | Freq: Once | INTRAVENOUS | Status: AC
  Administered 2021-12-25: 10 mL via INTRAVENOUS

## 2021-12-25 NOTE — Telephone Encounter (Signed)
CRITICAL VALUE ALERT Critical value received:  WBC 1.0 Date of notification:  12-25-21 Time of notification: 0263 Critical value read back:  Yes.   Nurse who received alert:  C. Jeni Duling RN MD notified time and response:  Dede Query PA-C, noted no new orders   Magnesium is also 1.2. will give 4 grams of magnesium tomorrow per orders.

## 2021-12-25 NOTE — Progress Notes (Signed)
Patient in clinic for PICC flush and labs. Good blood returned noted in both lines. Patient stable through blood draw. Patient discharged from clinic in stable condition.

## 2021-12-25 NOTE — Patient Instructions (Signed)
[facility]  Discharge Instructions: Thank you for choosing [facility] to provide your oncology and hematology care.  If you have a lab appointment with the [facility], please come in thru the Main Entrance and check in at the main information desk.  Wear comfortable clothing and clothing appropriate for easy access to any Portacath or PICC line.   We strive to give you quality time with your provider. You may need to reschedule your appointment if you arrive late (15 or more minutes).  Arriving late affects you and other patients whose appointments are after yours.  Also, if you miss three or more appointments without notifying the office, you may be dismissed from the clinic at the provider's discretion.      For prescription refill requests, have your pharmacy contact our office and allow 72 hours for refills to be completed.    Today you received the following chemotherapy and/or immunotherapy agents PICC flush labs      To help prevent nausea and vomiting after your treatment, we encourage you to take your nausea medication as directed.  BELOW ARE SYMPTOMS THAT SHOULD BE REPORTED IMMEDIATELY: *FEVER GREATER THAN 100.4 F (38 C) OR HIGHER *CHILLS OR SWEATING *NAUSEA AND VOMITING THAT IS NOT CONTROLLED WITH YOUR NAUSEA MEDICATION *UNUSUAL SHORTNESS OF BREATH *UNUSUAL BRUISING OR BLEEDING *URINARY PROBLEMS (pain or burning when urinating, or frequent urination) *BOWEL PROBLEMS (unusual diarrhea, constipation, pain near the anus) TENDERNESS IN MOUTH AND THROAT WITH OR WITHOUT PRESENCE OF ULCERS (sore throat, sores in mouth, or a toothache) UNUSUAL RASH, SWELLING OR PAIN  UNUSUAL VAGINAL DISCHARGE OR ITCHING   Items with * indicate a potential emergency and should be followed up as soon as possible or go to the Emergency Department if any problems should occur.  Please show the CHEMOTHERAPY ALERT CARD or IMMUNOTHERAPY ALERT CARD at check-in to the  Emergency Department and triage nurse.  Should you have questions after your visit or need to cancel or reschedule your appointment, please contact [facility] [phone]  and follow the prompts.  Office hours are 8:00 a.m. to 4:30 p.m. Monday - Friday. Please note that voicemails left after 4:00 p.m. may not be returned until the following business day.  We are closed weekends and major holidays. You have access to a nurse at all times for urgent questions. Please call the main number to the clinic [phone] and follow the prompts.  For any non-urgent questions, you may also contact your provider using MyChart. We now offer e-Visits for anyone 87 and older to request care online for non-urgent symptoms. For details visit [email or web address].   Also download the MyChart app! Go to the app store, search "MyChart", open the app, select [facility], and log in with your MyChart username and password.  Masks are optional in the cancer centers. If you would like for your care team to wear a mask while they are taking care of you, please let them know. You may have one support person who is at least 65 years old accompany you for your appointments.

## 2021-12-26 ENCOUNTER — Inpatient Hospital Stay: Payer: PPO

## 2021-12-26 DIAGNOSIS — Z5111 Encounter for antineoplastic chemotherapy: Secondary | ICD-10-CM | POA: Diagnosis not present

## 2021-12-26 MED ORDER — HEPARIN SOD (PORK) LOCK FLUSH 100 UNIT/ML IV SOLN
500.0000 [IU] | Freq: Once | INTRAVENOUS | Status: AC
  Administered 2021-12-26: 500 [IU] via INTRAVENOUS

## 2021-12-26 MED ORDER — SODIUM CHLORIDE 0.9% FLUSH
10.0000 mL | INTRAVENOUS | Status: AC
  Administered 2021-12-26: 10 mL

## 2021-12-26 MED ORDER — MAGNESIUM SULFATE 2 GM/50ML IV SOLN
2.0000 g | Freq: Once | INTRAVENOUS | Status: AC
  Administered 2021-12-26: 2 g via INTRAVENOUS
  Filled 2021-12-26: qty 50

## 2021-12-26 MED ORDER — SODIUM CHLORIDE 0.9 % IV SOLN: INTRAVENOUS | Status: DC

## 2021-12-26 NOTE — Patient Instructions (Signed)
[facility]  Discharge Instructions: Thank you for choosing [facility] to provide your oncology and hematology care.  If you have a lab appointment with the [facility], please come in thru the Main Entrance and check in at the main information desk.  Wear comfortable clothing and clothing appropriate for easy access to any Portacath or PICC line.   We strive to give you quality time with your provider. You may need to reschedule your appointment if you arrive late (15 or more minutes).  Arriving late affects you and other patients whose appointments are after yours.  Also, if you miss three or more appointments without notifying the office, you may be dismissed from the clinic at the provider's discretion.      For prescription refill requests, have your pharmacy contact our office and allow 72 hours for refills to be completed.    Today you received 4g IV magnesium sulfate.   BELOW ARE SYMPTOMS THAT SHOULD BE REPORTED IMMEDIATELY: *FEVER GREATER THAN 100.4 F (38 C) OR HIGHER *CHILLS OR SWEATING *NAUSEA AND VOMITING THAT IS NOT CONTROLLED WITH YOUR NAUSEA MEDICATION *UNUSUAL SHORTNESS OF BREATH *UNUSUAL BRUISING OR BLEEDING *URINARY PROBLEMS (pain or burning when urinating, or frequent urination) *BOWEL PROBLEMS (unusual diarrhea, constipation, pain near the anus) TENDERNESS IN MOUTH AND THROAT WITH OR WITHOUT PRESENCE OF ULCERS (sore throat, sores in mouth, or a toothache) UNUSUAL RASH, SWELLING OR PAIN  UNUSUAL VAGINAL DISCHARGE OR ITCHING   Items with * indicate a potential emergency and should be followed up as soon as possible or go to the Emergency Department if any problems should occur.  Please show the CHEMOTHERAPY ALERT CARD or IMMUNOTHERAPY ALERT CARD at check-in to the Emergency Department and triage nurse.  Should you have questions after your visit or need to cancel or reschedule your appointment, please contact [facility] [phone]  and follow the prompts.  Office hours are 8:00 a.m. to 4:30 p.m. Monday - Friday. Please note that voicemails left after 4:00 p.m. may not be returned until the following business day.  We are closed weekends and major holidays. You have access to a nurse at all times for urgent questions. Please call the main number to the clinic [phone] and follow the prompts.  For any non-urgent questions, you may also contact your provider using MyChart. We now offer e-Visits for anyone 69 and older to request care online for non-urgent symptoms. For details visit [email or web address].   Also download the MyChart app! Go to the app store, search "MyChart", open the app, select [facility], and log in with your MyChart username and password.  Masks are optional in the cancer centers. If you would like for your care team to wear a mask while they are taking care of you, please let them know. You may have one support person who is at least 65 years old accompany you for your appointments.

## 2021-12-26 NOTE — Progress Notes (Signed)
4g IV magnesium sulfate given today per MD orders. Tolerated infusion without adverse affects. Vital signs stable. No complaints at this time. Discharged from clinic ambulatory in stable condition. Alert and oriented x 3. F/U with [facility] as scheduled.

## 2021-12-26 NOTE — Progress Notes (Signed)
Patient will receive 4 gms of magnesium today per orders that were given yesterday by Dede Query PA-C. Magnesium level was 1.2 yesterday.

## 2021-12-28 ENCOUNTER — Other Ambulatory Visit: Payer: Self-pay

## 2021-12-28 ENCOUNTER — Inpatient Hospital Stay: Payer: PPO

## 2021-12-28 VITALS — BP 120/84 | HR 91 | Temp 97.3°F | Resp 18

## 2021-12-28 DIAGNOSIS — Z5111 Encounter for antineoplastic chemotherapy: Secondary | ICD-10-CM | POA: Diagnosis not present

## 2021-12-28 DIAGNOSIS — C9200 Acute myeloblastic leukemia, not having achieved remission: Secondary | ICD-10-CM

## 2021-12-28 LAB — COMPREHENSIVE METABOLIC PANEL
ALT: 12 U/L (ref 0–44)
AST: 23 U/L (ref 15–41)
Albumin: 3.2 g/dL — ABNORMAL LOW (ref 3.5–5.0)
Alkaline Phosphatase: 61 U/L (ref 38–126)
Anion gap: 7 (ref 5–15)
BUN: 18 mg/dL (ref 8–23)
CO2: 24 mmol/L (ref 22–32)
Calcium: 8.7 mg/dL — ABNORMAL LOW (ref 8.9–10.3)
Chloride: 108 mmol/L (ref 98–111)
Creatinine, Ser: 1.16 mg/dL — ABNORMAL HIGH (ref 0.44–1.00)
GFR, Estimated: 52 mL/min — ABNORMAL LOW (ref >60)
Glucose, Bld: 108 mg/dL — ABNORMAL HIGH (ref 70–99)
Potassium: 4.2 mmol/L (ref 3.5–5.1)
Sodium: 139 mmol/L (ref 135–145)
Total Bilirubin: 1 mg/dL (ref 0.3–1.2)
Total Protein: 6.5 g/dL (ref 6.5–8.1)

## 2021-12-28 LAB — CBC WITH DIFFERENTIAL/PLATELET
Abs Immature Granulocytes: 0 10*3/uL (ref 0.00–0.07)
Band Neutrophils: 1 %
Basophils Absolute: 0 10*3/uL (ref 0.0–0.1)
Basophils Relative: 0 %
Eosinophils Absolute: 0 10*3/uL (ref 0.0–0.5)
Eosinophils Relative: 0 %
HCT: 29 % — ABNORMAL LOW (ref 36.0–46.0)
Hemoglobin: 9.5 g/dL — ABNORMAL LOW (ref 12.0–15.0)
Lymphocytes Relative: 20 %
Lymphs Abs: 0.2 10*3/uL — ABNORMAL LOW (ref 0.7–4.0)
MCH: 29.7 pg (ref 26.0–34.0)
MCHC: 32.8 g/dL (ref 30.0–36.0)
MCV: 90.6 fL (ref 80.0–100.0)
Monocytes Absolute: 0 10*3/uL — ABNORMAL LOW (ref 0.1–1.0)
Monocytes Relative: 4 %
Neutro Abs: 0.6 10*3/uL — ABNORMAL LOW (ref 1.7–7.7)
Neutrophils Relative %: 75 %
Platelets: 107 10*3/uL — ABNORMAL LOW (ref 150–400)
RBC: 3.2 MIL/uL — ABNORMAL LOW (ref 3.87–5.11)
RDW: 15.6 % — ABNORMAL HIGH (ref 11.5–15.5)
WBC: 0.8 10*3/uL — CL (ref 4.0–10.5)
nRBC: 0 % (ref 0.0–0.2)

## 2021-12-28 LAB — SAMPLE TO BLOOD BANK

## 2021-12-28 LAB — MAGNESIUM: Magnesium: 1.6 mg/dL — ABNORMAL LOW (ref 1.7–2.4)

## 2021-12-28 MED ORDER — SODIUM CHLORIDE 0.9% FLUSH
10.0000 mL | INTRAVENOUS | Status: AC
  Administered 2021-12-28: 10 mL

## 2021-12-28 MED ORDER — HEPARIN SOD (PORK) LOCK FLUSH 100 UNIT/ML IV SOLN
500.0000 [IU] | Freq: Once | INTRAVENOUS | Status: AC
  Administered 2021-12-28: 500 [IU] via INTRAVENOUS

## 2021-12-28 NOTE — Progress Notes (Signed)
Kaitlyn Hull presented for PICC dressing change and labs for [facility].  See IV assessment in docflowsheets for PICC flush and dressing change details.   Good blood return present. Lumen 1 flushed with 16m NS and lumen 2 flushed with 10 mls of NS. 250U Heparin flushed in lumen 1 and 250 U of Hepain flushed in lumen 2. see MAR for further details.  CROYALTI SCHAUFtolerated procedure well and without incident.   Treatment given today per MD orders. No complaints at this time. Discharged from clinic ambulatory in stable condition. Alert and oriented x 3. [facility]

## 2021-12-28 NOTE — Progress Notes (Signed)
CRITICAL VALUE ALERT Critical value received:  WBC 0.8 Date of notification:  12-28-21 Time of notification: 1233 Critical value read back:  Yes.   Nurse who received alert:  C. Clarie Camey RN MD notified time and response:  1240, Dr. Delton Coombes

## 2021-12-28 NOTE — Patient Instructions (Signed)
[facility]  Discharge Instructions: Thank you for choosing [facility] to provide your oncology and hematology care.  If you have a lab appointment with the [facility], please come in thru the Main Entrance and check in at the main information desk.  Wear comfortable clothing and clothing appropriate for easy access to any Portacath or PICC line.   We strive to give you quality time with your provider. You may need to reschedule your appointment if you arrive late (15 or more minutes).  Arriving late affects you and other patients whose appointments are after yours.  Also, if you miss three or more appointments without notifying the office, you may be dismissed from the clinic at the provider's discretion.      For prescription refill requests, have your pharmacy contact our office and allow 72 hours for refills to be completed.    Today you received the following chemotherapy and/or immunotherapy agents picc line dressing change.       To help prevent nausea and vomiting after your treatment, we encourage you to take your nausea medication as directed.  BELOW ARE SYMPTOMS THAT SHOULD BE REPORTED IMMEDIATELY: *FEVER GREATER THAN 100.4 F (38 C) OR HIGHER *CHILLS OR SWEATING *NAUSEA AND VOMITING THAT IS NOT CONTROLLED WITH YOUR NAUSEA MEDICATION *UNUSUAL SHORTNESS OF BREATH *UNUSUAL BRUISING OR BLEEDING *URINARY PROBLEMS (pain or burning when urinating, or frequent urination) *BOWEL PROBLEMS (unusual diarrhea, constipation, pain near the anus) TENDERNESS IN MOUTH AND THROAT WITH OR WITHOUT PRESENCE OF ULCERS (sore throat, sores in mouth, or a toothache) UNUSUAL RASH, SWELLING OR PAIN  UNUSUAL VAGINAL DISCHARGE OR ITCHING   Items with * indicate a potential emergency and should be followed up as soon as possible or go to the Emergency Department if any problems should occur.  Please show the CHEMOTHERAPY ALERT CARD or IMMUNOTHERAPY ALERT CARD at  check-in to the Emergency Department and triage nurse.  Should you have questions after your visit or need to cancel or reschedule your appointment, please contact [facility] [phone]  and follow the prompts.  Office hours are 8:00 a.m. to 4:30 p.m. Monday - Friday. Please note that voicemails left after 4:00 p.m. may not be returned until the following business day.  We are closed weekends and major holidays. You have access to a nurse at all times for urgent questions. Please call the main number to the clinic [phone] and follow the prompts.  For any non-urgent questions, you may also contact your provider using MyChart. We now offer e-Visits for anyone 25 and older to request care online for non-urgent symptoms. For details visit [email or web address].   Also download the MyChart app! Go to the app store, search "MyChart", open the app, select [facility], and log in with your MyChart username and password.  Masks are optional in the cancer centers. If you would like for your care team to wear a mask while they are taking care of you, please let them know. You may have one support person who is at least 65 years old accompany you for your appointments.

## 2021-12-29 ENCOUNTER — Inpatient Hospital Stay: Payer: PPO

## 2022-01-01 ENCOUNTER — Inpatient Hospital Stay: Payer: PPO

## 2022-01-01 VITALS — BP 107/72 | HR 78 | Temp 97.6°F | Resp 16

## 2022-01-01 DIAGNOSIS — D61818 Other pancytopenia: Secondary | ICD-10-CM

## 2022-01-01 DIAGNOSIS — Z5111 Encounter for antineoplastic chemotherapy: Secondary | ICD-10-CM | POA: Diagnosis not present

## 2022-01-01 DIAGNOSIS — C9200 Acute myeloblastic leukemia, not having achieved remission: Secondary | ICD-10-CM

## 2022-01-01 LAB — COMPREHENSIVE METABOLIC PANEL
ALT: 10 U/L (ref 0–44)
AST: 19 U/L (ref 15–41)
Albumin: 3.2 g/dL — ABNORMAL LOW (ref 3.5–5.0)
Alkaline Phosphatase: 65 U/L (ref 38–126)
Anion gap: 7 (ref 5–15)
BUN: 18 mg/dL (ref 8–23)
CO2: 23 mmol/L (ref 22–32)
Calcium: 8.7 mg/dL — ABNORMAL LOW (ref 8.9–10.3)
Chloride: 110 mmol/L (ref 98–111)
Creatinine, Ser: 1.14 mg/dL — ABNORMAL HIGH (ref 0.44–1.00)
GFR, Estimated: 53 mL/min — ABNORMAL LOW (ref >60)
Glucose, Bld: 121 mg/dL — ABNORMAL HIGH (ref 70–99)
Potassium: 3.8 mmol/L (ref 3.5–5.1)
Sodium: 140 mmol/L (ref 135–145)
Total Bilirubin: 0.8 mg/dL (ref 0.3–1.2)
Total Protein: 6.6 g/dL (ref 6.5–8.1)

## 2022-01-01 LAB — SAMPLE TO BLOOD BANK

## 2022-01-01 LAB — CBC WITH DIFFERENTIAL/PLATELET
Abs Immature Granulocytes: 0.01 10*3/uL (ref 0.00–0.07)
Basophils Absolute: 0 10*3/uL (ref 0.0–0.1)
Basophils Relative: 0 %
Eosinophils Absolute: 0 10*3/uL (ref 0.0–0.5)
Eosinophils Relative: 0 %
HCT: 29.2 % — ABNORMAL LOW (ref 36.0–46.0)
Hemoglobin: 9.4 g/dL — ABNORMAL LOW (ref 12.0–15.0)
Immature Granulocytes: 2 %
Lymphocytes Relative: 40 %
Lymphs Abs: 0.2 10*3/uL — ABNORMAL LOW (ref 0.7–4.0)
MCH: 28.9 pg (ref 26.0–34.0)
MCHC: 32.2 g/dL (ref 30.0–36.0)
MCV: 89.8 fL (ref 80.0–100.0)
Monocytes Absolute: 0.1 10*3/uL (ref 0.1–1.0)
Monocytes Relative: 12 %
Neutro Abs: 0.2 10*3/uL — CL (ref 1.7–7.7)
Neutrophils Relative %: 46 %
Platelets: 115 10*3/uL — ABNORMAL LOW (ref 150–400)
RBC: 3.25 MIL/uL — ABNORMAL LOW (ref 3.87–5.11)
RDW: 15.9 % — ABNORMAL HIGH (ref 11.5–15.5)
WBC: 0.5 10*3/uL — CL (ref 4.0–10.5)
nRBC: 3.8 % — ABNORMAL HIGH (ref 0.0–0.2)

## 2022-01-01 LAB — MAGNESIUM: Magnesium: 1.2 mg/dL — ABNORMAL LOW (ref 1.7–2.4)

## 2022-01-01 MED ORDER — SODIUM CHLORIDE 0.9% FLUSH
10.0000 mL | INTRAVENOUS | Status: DC | PRN
  Administered 2022-01-01: 10 mL via INTRAVENOUS

## 2022-01-01 MED ORDER — HEPARIN SOD (PORK) LOCK FLUSH 100 UNIT/ML IV SOLN
500.0000 [IU] | Freq: Once | INTRAVENOUS | Status: AC
  Administered 2022-01-01: 250 [IU] via INTRAVENOUS

## 2022-01-01 NOTE — Patient Instructions (Signed)
[facility]  Discharge Instructions: Thank you for choosing [facility] to provide your oncology and hematology care.  If you have a lab appointment with the [facility], please come in thru the Main Entrance and check in at the main information desk.  Wear comfortable clothing and clothing appropriate for easy access to any Portacath or PICC line.   We strive to give you quality time with your provider. You may need to reschedule your appointment if you arrive late (15 or more minutes).  Arriving late affects you and other patients whose appointments are after yours.  Also, if you miss three or more appointments without notifying the office, you may be dismissed from the clinic at the provider's discretion.      For prescription refill requests, have your pharmacy contact our office and allow 72 hours for refills to be completed.    We flushed your PICC and labs drawn per protocol.   To help prevent nausea and vomiting after your treatment, we encourage you to take your nausea medication as directed.  BELOW ARE SYMPTOMS THAT SHOULD BE REPORTED IMMEDIATELY: *FEVER GREATER THAN 100.4 F (38 C) OR HIGHER *CHILLS OR SWEATING *NAUSEA AND VOMITING THAT IS NOT CONTROLLED WITH YOUR NAUSEA MEDICATION *UNUSUAL SHORTNESS OF BREATH *UNUSUAL BRUISING OR BLEEDING *URINARY PROBLEMS (pain or burning when urinating, or frequent urination) *BOWEL PROBLEMS (unusual diarrhea, constipation, pain near the anus) TENDERNESS IN MOUTH AND THROAT WITH OR WITHOUT PRESENCE OF ULCERS (sore throat, sores in mouth, or a toothache) UNUSUAL RASH, SWELLING OR PAIN  UNUSUAL VAGINAL DISCHARGE OR ITCHING   Items with * indicate a potential emergency and should be followed up as soon as possible or go to the Emergency Department if any problems should occur.  Please show the CHEMOTHERAPY ALERT CARD or IMMUNOTHERAPY ALERT CARD at check-in to the Emergency Department and triage  nurse.  Should you have questions after your visit or need to cancel or reschedule your appointment, please contact [facility] [phone]  and follow the prompts.  Office hours are 8:00 a.m. to 4:30 p.m. Monday - Friday. Please note that voicemails left after 4:00 p.m. may not be returned until the following business day.  We are closed weekends and major holidays. You have access to a nurse at all times for urgent questions. Please call the main number to the clinic [phone] and follow the prompts.  For any non-urgent questions, you may also contact your provider using MyChart. We now offer e-Visits for anyone 35 and older to request care online for non-urgent symptoms. For details visit [email or web address].   Also download the MyChart app! Go to the app store, search "MyChart", open the app, select [facility], and log in with your MyChart username and password.  Masks are optional in the cancer centers. If you would like for your care team to wear a mask while they are taking care of you, please let them know. You may have one support person who is at least 64 years old accompany you for your appointments.

## 2022-01-01 NOTE — Progress Notes (Signed)
Kaitlyn Hull presented for PICC line flush. Proper placement of PICC confirmed by CXR. PICC line located left arm . Good blood return present in each lumen PICC line flushed with 32m NS and 300U/333mHeparin in each lumen Procedure without incident. Patient tolerated procedure well.  Vitals stable and discharged home from clinic ambulatory. Follow up as scheduled.

## 2022-01-01 NOTE — Progress Notes (Signed)
CRITICAL VALUE ALERT Critical value received:  WBC 0.5. ANC 0.2.  Date of notification:  01-01-2022 Time of notification: 12:01 pm. Critical value read back:  Yes.   Nurse who received alert:  B. Berlyn Saylor RN MD notified time and response:  Dr. Linward Headland. Ouida Sills RN and Dorna Bloom NP at [facility].  Orders received from [facility] S. [place] to infuse 4 Grams of Magnesium Sulfate.    Magnesium 1.2 .

## 2022-01-02 ENCOUNTER — Inpatient Hospital Stay: Payer: PPO

## 2022-01-02 DIAGNOSIS — Z5111 Encounter for antineoplastic chemotherapy: Secondary | ICD-10-CM | POA: Diagnosis not present

## 2022-01-02 MED ORDER — SODIUM CHLORIDE 0.9 % IV SOLN: INTRAVENOUS | Status: DC

## 2022-01-02 MED ORDER — SODIUM CHLORIDE 0.9% FLUSH
10.0000 mL | Freq: Once | INTRAVENOUS | Status: AC | PRN
  Administered 2022-01-02: 10 mL

## 2022-01-02 MED ORDER — MAGNESIUM SULFATE 2 GM/50ML IV SOLN
2.0000 g | INTRAVENOUS | Status: AC
  Administered 2022-01-02 (×2): 2 g via INTRAVENOUS
  Filled 2022-01-02 (×2): qty 50

## 2022-01-02 MED ORDER — HEPARIN SOD (PORK) LOCK FLUSH 100 UNIT/ML IV SOLN
250.0000 [IU] | Freq: Once | INTRAVENOUS | Status: AC | PRN
  Administered 2022-01-02: 250 [IU]

## 2022-01-02 NOTE — Patient Instructions (Signed)
[facility]  Discharge Instructions: Thank you for choosing [facility] to provide your oncology and hematology care.  If you have a lab appointment with the [facility], please come in thru the Main Entrance and check in at the main information desk.  Wear comfortable clothing and clothing appropriate for easy access to any Portacath or PICC line.   We strive to give you quality time with your provider. You may need to reschedule your appointment if you arrive late (15 or more minutes).  Arriving late affects you and other patients whose appointments are after yours.  Also, if you miss three or more appointments without notifying the office, you may be dismissed from the clinic at the provider's discretion.      For prescription refill requests, have your pharmacy contact our office and allow 72 hours for refills to be completed.    Today you received the following Magnesium, return as scheduled.   To help prevent nausea and vomiting after your treatment, we encourage you to take your nausea medication as directed.  BELOW ARE SYMPTOMS THAT SHOULD BE REPORTED IMMEDIATELY: *FEVER GREATER THAN 100.4 F (38 C) OR HIGHER *CHILLS OR SWEATING *NAUSEA AND VOMITING THAT IS NOT CONTROLLED WITH YOUR NAUSEA MEDICATION *UNUSUAL SHORTNESS OF BREATH *UNUSUAL BRUISING OR BLEEDING *URINARY PROBLEMS (pain or burning when urinating, or frequent urination) *BOWEL PROBLEMS (unusual diarrhea, constipation, pain near the anus) TENDERNESS IN MOUTH AND THROAT WITH OR WITHOUT PRESENCE OF ULCERS (sore throat, sores in mouth, or a toothache) UNUSUAL RASH, SWELLING OR PAIN  UNUSUAL VAGINAL DISCHARGE OR ITCHING   Items with * indicate a potential emergency and should be followed up as soon as possible or go to the Emergency Department if any problems should occur.  Please show the CHEMOTHERAPY ALERT CARD or IMMUNOTHERAPY ALERT CARD at check-in to the Emergency Department and  triage nurse.  Should you have questions after your visit or need to cancel or reschedule your appointment, please contact [facility] [phone]  and follow the prompts.  Office hours are 8:00 a.m. to 4:30 p.m. Monday - Friday. Please note that voicemails left after 4:00 p.m. may not be returned until the following business day.  We are closed weekends and major holidays. You have access to a nurse at all times for urgent questions. Please call the main number to the clinic [phone] and follow the prompts.  For any non-urgent questions, you may also contact your provider using MyChart. We now offer e-Visits for anyone 93 and older to request care online for non-urgent symptoms. For details visit [email or web address].   Also download the MyChart app! Go to the app store, search "MyChart", open the app, select [facility], and log in with your MyChart username and password.  Masks are optional in the cancer centers. If you would like for your care team to wear a mask while they are taking care of you, please let them know. You may have one support person who is at least 65 years old accompany you for your appointments.

## 2022-01-02 NOTE — Progress Notes (Signed)
Patient tolerated therapy with no complaints voiced.  Side effects with management reviewed with understanding verbalized.  Port site clean and dry with no bruising or swelling noted at site.  Good blood return noted before and after administration of therapy.  Band aid applied.  Patient left in satisfactory condition with VSS and no s/s of distress noted.  

## 2022-01-04 ENCOUNTER — Inpatient Hospital Stay: Payer: PPO | Attending: Hematology

## 2022-01-04 VITALS — BP 118/77 | HR 93 | Temp 97.0°F | Resp 20

## 2022-01-04 DIAGNOSIS — Z7901 Long term (current) use of anticoagulants: Secondary | ICD-10-CM | POA: Insufficient documentation

## 2022-01-04 DIAGNOSIS — I4891 Unspecified atrial fibrillation: Secondary | ICD-10-CM | POA: Insufficient documentation

## 2022-01-04 DIAGNOSIS — M7989 Other specified soft tissue disorders: Secondary | ICD-10-CM | POA: Insufficient documentation

## 2022-01-04 DIAGNOSIS — Z79899 Other long term (current) drug therapy: Secondary | ICD-10-CM | POA: Insufficient documentation

## 2022-01-04 DIAGNOSIS — Z87891 Personal history of nicotine dependence: Secondary | ICD-10-CM | POA: Insufficient documentation

## 2022-01-04 DIAGNOSIS — Z5111 Encounter for antineoplastic chemotherapy: Secondary | ICD-10-CM | POA: Insufficient documentation

## 2022-01-04 DIAGNOSIS — Z7969 Long term (current) use of other immunomodulators and immunosuppressants: Secondary | ICD-10-CM | POA: Diagnosis not present

## 2022-01-04 DIAGNOSIS — Z452 Encounter for adjustment and management of vascular access device: Secondary | ICD-10-CM | POA: Insufficient documentation

## 2022-01-04 DIAGNOSIS — R197 Diarrhea, unspecified: Secondary | ICD-10-CM | POA: Insufficient documentation

## 2022-01-04 DIAGNOSIS — R609 Edema, unspecified: Secondary | ICD-10-CM | POA: Diagnosis not present

## 2022-01-04 DIAGNOSIS — D61810 Antineoplastic chemotherapy induced pancytopenia: Secondary | ICD-10-CM | POA: Diagnosis not present

## 2022-01-04 DIAGNOSIS — Z853 Personal history of malignant neoplasm of breast: Secondary | ICD-10-CM | POA: Insufficient documentation

## 2022-01-04 DIAGNOSIS — C9200 Acute myeloblastic leukemia, not having achieved remission: Secondary | ICD-10-CM | POA: Insufficient documentation

## 2022-01-04 LAB — COMPREHENSIVE METABOLIC PANEL
ALT: 10 U/L (ref 0–44)
AST: 20 U/L (ref 15–41)
Albumin: 3.1 g/dL — ABNORMAL LOW (ref 3.5–5.0)
Alkaline Phosphatase: 63 U/L (ref 38–126)
Anion gap: 9 (ref 5–15)
BUN: 12 mg/dL (ref 8–23)
CO2: 23 mmol/L (ref 22–32)
Calcium: 8.4 mg/dL — ABNORMAL LOW (ref 8.9–10.3)
Chloride: 106 mmol/L (ref 98–111)
Creatinine, Ser: 0.94 mg/dL (ref 0.44–1.00)
GFR, Estimated: >60 mL/min (ref >60)
Glucose, Bld: 114 mg/dL — ABNORMAL HIGH (ref 70–99)
Potassium: 3.7 mmol/L (ref 3.5–5.1)
Sodium: 138 mmol/L (ref 135–145)
Total Bilirubin: 0.7 mg/dL (ref 0.3–1.2)
Total Protein: 6.1 g/dL — ABNORMAL LOW (ref 6.5–8.1)

## 2022-01-04 LAB — CBC WITH DIFFERENTIAL/PLATELET
Abs Immature Granulocytes: 0 10*3/uL (ref 0.00–0.07)
Basophils Absolute: 0 10*3/uL (ref 0.0–0.1)
Basophils Relative: 0 %
Eosinophils Absolute: 0 10*3/uL (ref 0.0–0.5)
Eosinophils Relative: 0 %
HCT: 26.8 % — ABNORMAL LOW (ref 36.0–46.0)
Hemoglobin: 8.7 g/dL — ABNORMAL LOW (ref 12.0–15.0)
Lymphocytes Relative: 44 %
Lymphs Abs: 0.2 10*3/uL — ABNORMAL LOW (ref 0.7–4.0)
MCH: 29 pg (ref 26.0–34.0)
MCHC: 32.5 g/dL (ref 30.0–36.0)
MCV: 89.3 fL (ref 80.0–100.0)
Monocytes Absolute: 0 10*3/uL — ABNORMAL LOW (ref 0.1–1.0)
Monocytes Relative: 4 %
Neutro Abs: 0.2 10*3/uL — CL (ref 1.7–7.7)
Neutrophils Relative %: 52 %
Platelets: 159 10*3/uL (ref 150–400)
RBC: 3 MIL/uL — ABNORMAL LOW (ref 3.87–5.11)
RDW: 16.5 % — ABNORMAL HIGH (ref 11.5–15.5)
WBC Morphology: ABNORMAL
WBC: 0.4 10*3/uL — CL (ref 4.0–10.5)
nRBC: 10 /100 WBC — ABNORMAL HIGH
nRBC: 13.2 % — ABNORMAL HIGH (ref 0.0–0.2)

## 2022-01-04 LAB — MAGNESIUM: Magnesium: 1.5 mg/dL — ABNORMAL LOW (ref 1.7–2.4)

## 2022-01-04 LAB — SAMPLE TO BLOOD BANK

## 2022-01-04 MED ORDER — HEPARIN SOD (PORK) LOCK FLUSH 100 UNIT/ML IV SOLN
500.0000 [IU] | Freq: Once | INTRAVENOUS | Status: AC
  Administered 2022-01-04: 500 [IU] via INTRAVENOUS

## 2022-01-04 MED ORDER — SODIUM CHLORIDE 0.9% FLUSH
10.0000 mL | Freq: Once | INTRAVENOUS | Status: AC
  Administered 2022-01-04: 10 mL via INTRAVENOUS

## 2022-01-04 NOTE — Patient Instructions (Signed)
[facility]  Discharge Instructions: Thank you for choosing [facility] to provide your oncology and hematology care.  If you have a lab appointment with the [facility], please come in thru the Main Entrance and check in at the main information desk.  Wear comfortable clothing and clothing appropriate for easy access to any Portacath or PICC line.   We strive to give you quality time with your provider. You may need to reschedule your appointment if you arrive late (15 or more minutes).  Arriving late affects you and other patients whose appointments are after yours.  Also, if you miss three or more appointments without notifying the office, you may be dismissed from the clinic at the provider's discretion.      For prescription refill requests, have your pharmacy contact our office and allow 72 hours for refills to be completed.    Today you received the following chemotherapy and/or immunotherapy agents picc flush dressing change      To help prevent nausea and vomiting after your treatment, we encourage you to take your nausea medication as directed.  BELOW ARE SYMPTOMS THAT SHOULD BE REPORTED IMMEDIATELY: *FEVER GREATER THAN 100.4 F (38 C) OR HIGHER *CHILLS OR SWEATING *NAUSEA AND VOMITING THAT IS NOT CONTROLLED WITH YOUR NAUSEA MEDICATION *UNUSUAL SHORTNESS OF BREATH *UNUSUAL BRUISING OR BLEEDING *URINARY PROBLEMS (pain or burning when urinating, or frequent urination) *BOWEL PROBLEMS (unusual diarrhea, constipation, pain near the anus) TENDERNESS IN MOUTH AND THROAT WITH OR WITHOUT PRESENCE OF ULCERS (sore throat, sores in mouth, or a toothache) UNUSUAL RASH, SWELLING OR PAIN  UNUSUAL VAGINAL DISCHARGE OR ITCHING   Items with * indicate a potential emergency and should be followed up as soon as possible or go to the Emergency Department if any problems should occur.  Please show the CHEMOTHERAPY ALERT CARD or IMMUNOTHERAPY ALERT CARD at  check-in to the Emergency Department and triage nurse.  Should you have questions after your visit or need to cancel or reschedule your appointment, please contact [facility]  [phone]  and follow the prompts.  Office hours are 8:00 a.m. to 4:30 p.m. Monday - Friday. Please note that voicemails left after 4:00 p.m. may not be returned until the following business day.  We are closed weekends and major holidays. You have access to a nurse at all times for urgent questions. Please call the main number to the clinic [phone] and follow the prompts.  For any non-urgent questions, you may also contact your provider using MyChart. We now offer e-Visits for anyone 17 and older to request care online for non-urgent symptoms. For details visit [email or web address].   Also download the MyChart app! Go to the app store, search "MyChart", open the app, select [facility], and log in with your MyChart username and password.  Masks are optional in the cancer centers. If you would like for your care team to wear a mask while they are taking care of you, please let them know. You may have one support person who is at least 65 years old accompany you for your appointments.

## 2022-01-04 NOTE — Progress Notes (Signed)
CRITICAL VALUE ALERT Critical value received:  WBC 0.4. ANC 0.2.  Date of notification:  01-04-2022 Time of notification: 13:33 pm Critical value read back:  Yes.   Nurse who received alert:  B. Shaurya Rawdon RN MD notified time and response:  Dr. Delton Coombes @ 13:46pm. [facility] patient. Called critical's and followed [facility] orders.

## 2022-01-04 NOTE — Progress Notes (Signed)
Kaitlyn Hull presented for PICC flush, labs, and dressing change.  See IV assessment in docflowsheets for PICC details.  Proper placement of PICC confirmed by CXR.  PICC located left arm.  Good blood return present. PICC flushed both lumens with 48m NS and 250U Heparin, see MAR for further details.  Kaitlyn WORSTtolerated procedure well and without incident.

## 2022-01-04 NOTE — Progress Notes (Signed)
TO orders received from [facility] , Kandice Moos NP to infuse 3 grams of Magnesium Sulfate 01/05/2022. Orders read back. Patient's HGB 8.7 Asymptomatic no blood products needed per [facility] orders. Platelets 159. Magnesium 1.5.

## 2022-01-05 ENCOUNTER — Inpatient Hospital Stay: Payer: PPO

## 2022-01-05 DIAGNOSIS — Z452 Encounter for adjustment and management of vascular access device: Secondary | ICD-10-CM

## 2022-01-05 DIAGNOSIS — Z5111 Encounter for antineoplastic chemotherapy: Secondary | ICD-10-CM | POA: Diagnosis not present

## 2022-01-05 MED ORDER — MAGNESIUM SULFATE IN D5W 1-5 GM/100ML-% IV SOLN
1.0000 g | Freq: Once | INTRAVENOUS | Status: AC
  Administered 2022-01-05: 1 g via INTRAVENOUS
  Filled 2022-01-05: qty 100

## 2022-01-05 MED ORDER — SODIUM CHLORIDE 0.9 % IV SOLN: Freq: Once | INTRAVENOUS | Status: AC

## 2022-01-05 MED ORDER — MAGNESIUM SULFATE 2 GM/50ML IV SOLN
2.0000 g | Freq: Once | INTRAVENOUS | Status: AC
  Administered 2022-01-05: 2 g via INTRAVENOUS
  Filled 2022-01-05: qty 50

## 2022-01-05 MED ORDER — HEPARIN SOD (PORK) LOCK FLUSH 100 UNIT/ML IV SOLN
500.0000 [IU] | Freq: Once | INTRAVENOUS | Status: AC
  Administered 2022-01-05: 500 [IU] via INTRAVENOUS

## 2022-01-05 MED ORDER — SODIUM CHLORIDE 0.9% FLUSH
10.0000 mL | INTRAVENOUS | Status: DC | PRN
  Administered 2022-01-05: 10 mL via INTRAVENOUS

## 2022-01-05 NOTE — Progress Notes (Signed)
Patient presents today for Magnesium 3 grams IV per orders from [facility].  Patient is in satisfactory condition with no new complaints voiced.  Vital signs are stable.  We will proceed with infusion per provider orders.   Patient tolerated infusion well with no complaints voiced.  Patient left via wheelchair in stable condition.  Vital signs stable at discharge.  Follow up as scheduled.

## 2022-01-05 NOTE — Patient Instructions (Signed)
[facility]  Discharge Instructions: Thank you for choosing [facility] to provide your oncology and hematology care.  If you have a lab appointment with the Cancer Center, please come in thru the Main Entrance and check in at the main information desk.  Wear comfortable clothing and clothing appropriate for easy access to any Portacath or PICC line.   We strive to give you quality time with your provider. You may need to reschedule your appointment if you arrive late (15 or more minutes).  Arriving late affects you and other patients whose appointments are after yours.  Also, if you miss three or more appointments without notifying the office, you may be dismissed from the clinic at the provider's discretion.      For prescription refill requests, have your pharmacy contact our office and allow 72 hours for refills to be completed.     To help prevent nausea and vomiting after your treatment, we encourage you to take your nausea medication as directed.  BELOW ARE SYMPTOMS THAT SHOULD BE REPORTED IMMEDIATELY: *FEVER GREATER THAN 100.4 F (38 C) OR HIGHER *CHILLS OR SWEATING *NAUSEA AND VOMITING THAT IS NOT CONTROLLED WITH YOUR NAUSEA MEDICATION *UNUSUAL SHORTNESS OF BREATH *UNUSUAL BRUISING OR BLEEDING *URINARY PROBLEMS (pain or burning when urinating, or frequent urination) *BOWEL PROBLEMS (unusual diarrhea, constipation, pain near the anus) TENDERNESS IN MOUTH AND THROAT WITH OR WITHOUT PRESENCE OF ULCERS (sore throat, sores in mouth, or a toothache) UNUSUAL RASH, SWELLING OR PAIN  UNUSUAL VAGINAL DISCHARGE OR ITCHING   Items with * indicate a potential emergency and should be followed up as soon as possible or go to the Emergency Department if any problems should occur.  Please show the CHEMOTHERAPY ALERT CARD or IMMUNOTHERAPY ALERT CARD at check-in to the Emergency Department and triage nurse.  Should you have questions after your visit or need to  cancel or reschedule your appointment, please contact [facility] [phone]  and follow the prompts.  Office hours are 8:00 a.m. to 4:30 p.m. Monday - Friday. Please note that voicemails left after 4:00 p.m. may not be returned until the following business day.  We are closed weekends and major holidays. You have access to a nurse at all times for urgent questions. Please call the main number to the clinic [phone] and follow the prompts.  For any non-urgent questions, you may also contact your provider using MyChart. We now offer e-Visits for anyone 18 and older to request care online for non-urgent symptoms. For details visit [email or web address].   Also download the MyChart app! Go to the app store, search "MyChart", open the app, select [facility], and log in with your MyChart username and password.  Masks are optional in the cancer centers. If you would like for your care team to wear a mask while they are taking care of you, please let them know. You may have one support person who is at least 65 years old accompany you for your appointments.  

## 2022-01-08 ENCOUNTER — Other Ambulatory Visit: Payer: PPO

## 2022-01-11 ENCOUNTER — Inpatient Hospital Stay: Payer: PPO

## 2022-01-11 DIAGNOSIS — C9200 Acute myeloblastic leukemia, not having achieved remission: Secondary | ICD-10-CM

## 2022-01-11 DIAGNOSIS — Z5111 Encounter for antineoplastic chemotherapy: Secondary | ICD-10-CM | POA: Diagnosis not present

## 2022-01-11 LAB — CBC WITH DIFFERENTIAL/PLATELET
Abs Immature Granulocytes: 0 10*3/uL (ref 0.00–0.07)
Basophils Absolute: 0 10*3/uL (ref 0.0–0.1)
Basophils Relative: 0 %
Eosinophils Absolute: 0 10*3/uL (ref 0.0–0.5)
Eosinophils Relative: 0 %
HCT: 28 % — ABNORMAL LOW (ref 36.0–46.0)
Hemoglobin: 8.9 g/dL — ABNORMAL LOW (ref 12.0–15.0)
Immature Granulocytes: 0 %
Lymphocytes Relative: 83 %
Lymphs Abs: 0.2 10*3/uL — ABNORMAL LOW (ref 0.7–4.0)
MCH: 30 pg (ref 26.0–34.0)
MCHC: 31.8 g/dL (ref 30.0–36.0)
MCV: 94.3 fL (ref 80.0–100.0)
Monocytes Absolute: 0 10*3/uL — ABNORMAL LOW (ref 0.1–1.0)
Monocytes Relative: 0 %
Neutro Abs: 0 10*3/uL — CL (ref 1.7–7.7)
Neutrophils Relative %: 17 %
Platelets: 193 10*3/uL (ref 150–400)
RBC: 2.97 MIL/uL — ABNORMAL LOW (ref 3.87–5.11)
RDW: 20 % — ABNORMAL HIGH (ref 11.5–15.5)
WBC: 0.2 10*3/uL — CL (ref 4.0–10.5)
nRBC: 11.1 % — ABNORMAL HIGH (ref 0.0–0.2)

## 2022-01-11 LAB — COMPREHENSIVE METABOLIC PANEL
ALT: 14 U/L (ref 0–44)
AST: 23 U/L (ref 15–41)
Albumin: 3.2 g/dL — ABNORMAL LOW (ref 3.5–5.0)
Alkaline Phosphatase: 77 U/L (ref 38–126)
Anion gap: 7 (ref 5–15)
BUN: 16 mg/dL (ref 8–23)
CO2: 24 mmol/L (ref 22–32)
Calcium: 8.7 mg/dL — ABNORMAL LOW (ref 8.9–10.3)
Chloride: 109 mmol/L (ref 98–111)
Creatinine, Ser: 1.17 mg/dL — ABNORMAL HIGH (ref 0.44–1.00)
GFR, Estimated: 52 mL/min — ABNORMAL LOW (ref >60)
Glucose, Bld: 140 mg/dL — ABNORMAL HIGH (ref 70–99)
Potassium: 3.9 mmol/L (ref 3.5–5.1)
Sodium: 140 mmol/L (ref 135–145)
Total Bilirubin: 1.3 mg/dL — ABNORMAL HIGH (ref 0.3–1.2)
Total Protein: 6.6 g/dL (ref 6.5–8.1)

## 2022-01-11 LAB — MAGNESIUM: Magnesium: 1.7 mg/dL (ref 1.7–2.4)

## 2022-01-11 MED ORDER — SODIUM CHLORIDE 0.9% FLUSH
10.0000 mL | INTRAVENOUS | Status: DC | PRN
  Administered 2022-01-11: 10 mL via INTRAVENOUS

## 2022-01-11 MED ORDER — HEPARIN SOD (PORK) LOCK FLUSH 100 UNIT/ML IV SOLN
500.0000 [IU] | Freq: Once | INTRAVENOUS | Status: AC
  Administered 2022-01-11: 500 [IU] via INTRAVENOUS

## 2022-01-11 NOTE — Progress Notes (Signed)
Patient informed of lab results.  Per [facility] orders we will transfuse one unit of PRBC if hemoglobin is less than 9.0 AND patient is symptomatic.  Patient denies any symptoms at this time.  We will hold any treatment per orders.  Labs faxed to [facility].

## 2022-01-11 NOTE — Patient Instructions (Signed)
[facility]  Discharge Instructions: Thank you for choosing [facility] to provide your oncology and hematology care.  If you have a lab appointment with the Cancer Center, please come in thru the Main Entrance and check in at the main information desk.  Wear comfortable clothing and clothing appropriate for easy access to any Portacath or PICC line.   We strive to give you quality time with your provider. You may need to reschedule your appointment if you arrive late (15 or more minutes).  Arriving late affects you and other patients whose appointments are after yours.  Also, if you miss three or more appointments without notifying the office, you may be dismissed from the clinic at the provider's discretion.      For prescription refill requests, have your pharmacy contact our office and allow 72 hours for refills to be completed.     To help prevent nausea and vomiting after your treatment, we encourage you to take your nausea medication as directed.  BELOW ARE SYMPTOMS THAT SHOULD BE REPORTED IMMEDIATELY: *FEVER GREATER THAN 100.4 F (38 C) OR HIGHER *CHILLS OR SWEATING *NAUSEA AND VOMITING THAT IS NOT CONTROLLED WITH YOUR NAUSEA MEDICATION *UNUSUAL SHORTNESS OF BREATH *UNUSUAL BRUISING OR BLEEDING *URINARY PROBLEMS (pain or burning when urinating, or frequent urination) *BOWEL PROBLEMS (unusual diarrhea, constipation, pain near the anus) TENDERNESS IN MOUTH AND THROAT WITH OR WITHOUT PRESENCE OF ULCERS (sore throat, sores in mouth, or a toothache) UNUSUAL RASH, SWELLING OR PAIN  UNUSUAL VAGINAL DISCHARGE OR ITCHING   Items with * indicate a potential emergency and should be followed up as soon as possible or go to the Emergency Department if any problems should occur.  Please show the CHEMOTHERAPY ALERT CARD or IMMUNOTHERAPY ALERT CARD at check-in to the Emergency Department and triage nurse.  Should you have questions after your visit or need to  cancel or reschedule your appointment, please contact [facility] [phone]  and follow the prompts.  Office hours are 8:00 a.m. to 4:30 p.m. Monday - Friday. Please note that voicemails left after 4:00 p.m. may not be returned until the following business day.  We are closed weekends and major holidays. You have access to a nurse at all times for urgent questions. Please call the main number to the clinic [phone] and follow the prompts.  For any non-urgent questions, you may also contact your provider using MyChart. We now offer e-Visits for anyone 18 and older to request care online for non-urgent symptoms. For details visit [email or web address].   Also download the MyChart app! Go to the app store, search "MyChart", open the app, select , and log in with your MyChart username and password.  Masks are optional in the cancer centers. If you would like for your care team to wear a mask while they are taking care of you, please let them know. You may have one support person who is at least 65 years old accompany you for your appointments.  

## 2022-01-11 NOTE — Progress Notes (Signed)
Patients PICC flushed without difficulty.  Good blood return noted from each lumen with no bruising or swelling noted at site. PICC dressing changed today.   VSS with discharge and left in satisfactory condition with no s/s of distress noted.

## 2022-01-11 NOTE — Progress Notes (Signed)
CRITICAL VALUE ALERT Critical value received:  WBC 0.2. ANC 0.0.  Date of notification:  01-11-2022. Time of notification: 11:57 am.  Critical value read back:  Yes.   Nurse who received alert:  B. Riya Huxford RN.  MD notified time and response:  Dr. Delton Coombes @ 12:00 pm. [facility] patient has standing orders.   HGB 8.9 and Platelets 193.

## 2022-01-12 ENCOUNTER — Inpatient Hospital Stay: Payer: PPO

## 2022-01-15 ENCOUNTER — Inpatient Hospital Stay: Payer: PPO

## 2022-01-15 ENCOUNTER — Inpatient Hospital Stay (HOSPITAL_BASED_OUTPATIENT_CLINIC_OR_DEPARTMENT_OTHER): Payer: PPO | Admitting: Hematology

## 2022-01-15 DIAGNOSIS — C9200 Acute myeloblastic leukemia, not having achieved remission: Secondary | ICD-10-CM | POA: Diagnosis not present

## 2022-01-15 DIAGNOSIS — Z452 Encounter for adjustment and management of vascular access device: Secondary | ICD-10-CM

## 2022-01-15 DIAGNOSIS — Z5111 Encounter for antineoplastic chemotherapy: Secondary | ICD-10-CM | POA: Diagnosis not present

## 2022-01-15 LAB — CBC WITH DIFFERENTIAL/PLATELET
Abs Immature Granulocytes: 0 10*3/uL (ref 0.00–0.07)
Basophils Absolute: 0 10*3/uL (ref 0.0–0.1)
Basophils Relative: 0 %
Eosinophils Absolute: 0 10*3/uL (ref 0.0–0.5)
Eosinophils Relative: 0 %
HCT: 29.2 % — ABNORMAL LOW (ref 36.0–46.0)
Hemoglobin: 9.3 g/dL — ABNORMAL LOW (ref 12.0–15.0)
Immature Granulocytes: 0 %
Lymphocytes Relative: 67 %
Lymphs Abs: 0.2 10*3/uL — ABNORMAL LOW (ref 0.7–4.0)
MCH: 29.7 pg (ref 26.0–34.0)
MCHC: 31.8 g/dL (ref 30.0–36.0)
MCV: 93.3 fL (ref 80.0–100.0)
Monocytes Absolute: 0 10*3/uL — ABNORMAL LOW (ref 0.1–1.0)
Monocytes Relative: 3 %
Neutro Abs: 0.1 10*3/uL — CL (ref 1.7–7.7)
Neutrophils Relative %: 30 %
Platelets: 202 10*3/uL (ref 150–400)
RBC: 3.13 MIL/uL — ABNORMAL LOW (ref 3.87–5.11)
RDW: 21 % — ABNORMAL HIGH (ref 11.5–15.5)
WBC: 0.3 10*3/uL — CL (ref 4.0–10.5)
nRBC: 11.8 % — ABNORMAL HIGH (ref 0.0–0.2)

## 2022-01-15 LAB — COMPREHENSIVE METABOLIC PANEL
ALT: 14 U/L (ref 0–44)
AST: 24 U/L (ref 15–41)
Albumin: 3.4 g/dL — ABNORMAL LOW (ref 3.5–5.0)
Alkaline Phosphatase: 86 U/L (ref 38–126)
Anion gap: 7 (ref 5–15)
BUN: 16 mg/dL (ref 8–23)
CO2: 23 mmol/L (ref 22–32)
Calcium: 8.9 mg/dL (ref 8.9–10.3)
Chloride: 108 mmol/L (ref 98–111)
Creatinine, Ser: 1.27 mg/dL — ABNORMAL HIGH (ref 0.44–1.00)
GFR, Estimated: 47 mL/min — ABNORMAL LOW (ref >60)
Glucose, Bld: 121 mg/dL — ABNORMAL HIGH (ref 70–99)
Potassium: 3.8 mmol/L (ref 3.5–5.1)
Sodium: 138 mmol/L (ref 135–145)
Total Bilirubin: 1.4 mg/dL — ABNORMAL HIGH (ref 0.3–1.2)
Total Protein: 7 g/dL (ref 6.5–8.1)

## 2022-01-15 LAB — SAMPLE TO BLOOD BANK

## 2022-01-15 LAB — MAGNESIUM: Magnesium: 1.4 mg/dL — ABNORMAL LOW (ref 1.7–2.4)

## 2022-01-15 MED ORDER — SODIUM CHLORIDE 0.9% FLUSH
10.0000 mL | Freq: Once | INTRAVENOUS | Status: AC
  Administered 2022-01-15: 10 mL via INTRAVENOUS

## 2022-01-15 MED ORDER — HEPARIN SOD (PORK) LOCK FLUSH 100 UNIT/ML IV SOLN
250.0000 [IU] | Freq: Once | INTRAVENOUS | Status: AC
  Administered 2022-01-15: 250 [IU] via INTRAVENOUS

## 2022-01-15 NOTE — Progress Notes (Signed)
[address], [place] [address]   CLINIC:  Medical Oncology/Hematology  PCP:  [name], MD [place] [address] [phone]   REASON FOR VISIT:  Follow-up for acute myeloid leukemia.  PRIOR THERAPY: None  NGS Results: Positive for RUNX1, U2AF1, BCOR, ZRSR2, NRAS, KRAS, and DNMT3A  CURRENT THERAPY: Decitabine and venetoclax  BRIEF ONCOLOGIC HISTORY:  Oncology History  Acute myeloid leukemia not having achieved remission ([place])  10/30/2021 Initial Diagnosis   Acute myeloid leukemia not having achieved remission ([place])   11/14/2021 -  Chemotherapy   Patient is on Treatment Plan : AML Decitabine D1-5 q28d       CANCER STAGING:  Cancer Staging  No matching staging information was found for the patient.   INTERVAL HISTORY:  Ms. Zanders 66 y.o. female seen for follow-up of AML.  She recently underwent bone marrow biopsy at [facility].  She is currently continuing acyclovir and posaconazole and magnesium twice daily.  She is also taking Eliquis twice daily for atrial fibrillation.  Denies any bleeding issues.  Denies any fevers, night sweats or weight loss.  Energy levels are 50%.    REVIEW OF SYSTEMS:  Review of Systems  All other systems reviewed and are negative.    PAST MEDICAL/SURGICAL HISTORY:  Past Medical History:  Diagnosis Date   Atrial fibrillation with RVR ([place]) 06/2013   Breast cancer ([place]) 2007   left   Breast disorder    cancer   Diabetes mellitus    Type II   Dysrhythmia    Hematuria 01/20/2014   Hypertension    Hyperthyroidism 06/2013   Obesity    PMB (postmenopausal bleeding) 07/24/2012   Had 16.1 mm endometrium on [place] will get endo biopsy   Psoriasis    Urinary frequency 01/20/2014   Past Surgical History:  Procedure Laterality Date   BREAST BIOPSY Left 09/14/2020   Procedure: BREAST BIOPSY;  Surgeon: Aviva Signs, MD;  Location: [facility] ORS;  Service: General;   Laterality: Left;   BREAST SURGERY  02/20/06   left side    CESAREAN SECTION  1995   COLONOSCOPY WITH PROPOFOL N/A 10/28/2021   Procedure: COLONOSCOPY WITH PROPOFOL;  Surgeon: Daneil Dolin, MD;  Location: [facility] END[facility] SUITE;  Service: Endoscopy;  Laterality: N/A;   ESOPHAGOGASTRODUODENOSCOPY (EGD) WITH PROPOFOL N/A 10/28/2021   Procedure: ESOPHAGOGASTRODUODENOSCOPY (EGD) WITH PROPOFOL;  Surgeon: Daneil Dolin, MD;  Location: [facility] END[facility] SU[facility];  Service: Endoscopy;  Laterality: N/A;   HYSTEROSCOPY WITH D & C N/A 08/13/2012   Procedure: DILATATION AND CURETTAGE /HYSTEROSCOPY;  Surgeon: Florian Buff, MD;  Location: [facility] ORS;  Service: Gynecology;  Laterality: N/A;   SECONDARY CLOSURE OF WOUND Left 02/20/2021   Procedure: SIMPLE WOUND CLOSURE;  Surgeon: Aviva Signs, MD;  Location: [facility] ORS;  Service: General;  Laterality: Left;     SOCIAL HISTORY:  Social History   Socioeconomic History   Marital status: Widowed    Spouse name: Not on file   Number of children: Not on file   Years of education: Not on file   Highest education level: Not on file  Occupational History   Not on file  Tobacco Use   Smoking status: Former    Packs/day: 0.01    Years: 1.00    Total pack years: 0.01    Types: Cigarettes    Start date: 03/05/1976    Quit date: 03/06/2003    Years since quitting: 54.8  Smokeless tobacco: Never  Vaping Use   Vaping Use: Never used  Substance and Sexual Activity   Alcohol use: No    Alcohol/week: 0.0 standard drinks of alcohol   Drug use: No   Sexual activity: Not Currently    Birth control/protection: Post-menopausal  Other Topics Concern   Not on file  Social History Narrative   Not on file   Social Determinants of Health   Financial Resource Strain: Not on file  Food Insecurity: No Food Insecurity (12/10/2021)   Hunger Vital Sign    Worried About Running Out of Food in the Last Year: Never true    Ran Out of Food in the Last Year: Never true  Transportation Needs: No  Transportation Needs (12/10/2021)   PRAPARE - Hydrologist (Medical): No    Lack of Transportation (Non-Medical): No  Physical Activity: Not on file  Stress: Not on file  Social Connections: Not on file  Intimate Partner Violence: Not At Risk (12/10/2021)   Humiliation, Afraid, Rape, and Kick questionnaire    Fear of Current or Ex-Partner: No    Emotionally Abused: No    Physically Abused: No    Sexually Abused: No    FAMILY HISTORY:  Family History  Problem Relation Age of Onset   Heart failure Father    CAD Father    Diabetes Sister    Other Sister        blood clots   Breast cancer Sister    Alzheimer's disease Mother    Diabetes Sister    Hypertension Maternal Grandmother    Colon cancer Neg Hx     CURRENT MEDICATIONS:  Outpatient Encounter Medications as of 01/15/2022  Medication Sig Note   acetaminophen (TYLENOL) 500 MG tablet Take 500 mg by mouth every 6 (six) hours as needed. Taken as needed for headache    acyclovir (ZOVIRAX) 400 MG tablet Take 400 mg by mouth 2 (two) times daily.    allopurinol (ZYLOPRIM) 300 MG tablet Take 1 tablet (300 mg total) by mouth daily.    ALPRAZolam (XANAX) 0.5 MG tablet Take 1 tablet by mouth 2 (two) times daily as needed.    furosemide (LASIX) 20 MG tablet Take 1 tablet (20 mg total) by mouth daily as needed.    LORazepam (ATIVAN) 0.5 MG tablet Take 1 tablet (0.5 mg total) by mouth 2 (two) times daily as needed for anxiety.    magnesium oxide (MAG-OX) 400 (240 Mg) MG tablet Take 1 tablet by mouth 2 (two) times daily.    magnesium oxide (MAG-OX) 400 MG tablet Take by mouth.    MAGNESIUM OXIDE PO Take 266 mg by mouth 3 (three) times daily. 2 tablets TID 12/07/2021: Dose was increased from 1 tablet to 2 tablets TID   metFORMIN (GLUCOPHAGE-XR) 500 MG 24 hr tablet Take 500-1,000 mg by mouth See admin instructions. Take 500 mg by mouth in the morning and 1000 mg at bedtime    metoprolol tartrate (LOPRESSOR) 100 MG  tablet Take 1 tablet (100 mg total) by mouth 2 (two) times daily.    posaconazole (NOXAFIL) 100 MG TBEC delayed-release tablet Take 3 tablets (300 mg total) by mouth daily.    pravastatin (PRAVACHOL) 40 MG tablet Take 40 mg by mouth at bedtime.    prochlorperazine (COMPAZINE) 10 MG tablet Take 10 mg by mouth as needed.    Specialty Vitamins Products (MG PLUS PROTEIN) 133 MG TABS Take 1 tablet by mouth 3 (three) times daily.  venetoclax (VENCLEXTA) 100 MG tablet Take 1 tablet (100 mg total) by mouth daily.    Facility-Administered Encounter Medications as of 01/15/2022  Medication   0.9 %  sodium chloride infusion (Manually program via Guardrails IV Fluids)   sodium chloride flush (NS) 0.9 % injection 10 mL    ALLERGIES:  No Known Allergies   PHYSICAL EXAM:  ECOG Performance status: 1  There were no vitals filed for this visit.  There were no vitals filed for this visit.  Physical Exam Vitals reviewed.  Constitutional:      Appearance: Normal appearance.  HENT:     Mouth/Throat:     Pharynx: No oropharyngeal exudate.  Cardiovascular:     Rate and Rhythm: Normal rate and regular rhythm.     Heart sounds: Normal heart sounds.  Pulmonary:     Breath sounds: Normal breath sounds.  Abdominal:     Palpations: Abdomen is soft. There is no mass.  Neurological:     General: No focal deficit present.     Mental Status: She is alert and oriented to person, place, and time.  Psychiatric:        Mood and Affect: Mood normal.        Behavior: Behavior normal.      LABORATORY DATA:  I have reviewed the labs as listed.  CBC    Component Value Date/Time   WBC 0.2 (LL) 01/11/2022 1056   RBC 2.97 (L) 01/11/2022 1056   HGB 8.9 (L) 01/11/2022 1056   HCT 28.0 (L) 01/11/2022 1056   PLT 193 01/11/2022 1056   MCV 94.3 01/11/2022 1056   MCH 30.0 01/11/2022 1056   MCHC 31.8 01/11/2022 1056   RDW 20.0 (H) 01/11/2022 1056   LYMPHSABS 0.2 (L) 01/11/2022 1056   MONOABS 0.0 (L)  01/11/2022 1056   EOSABS 0.0 01/11/2022 1056   BASOSABS 0.0 01/11/2022 1056      Latest Ref Rng & Units 01/11/2022   10:56 AM 01/04/2022   11:33 AM 01/01/2022   11:12 AM  CMP  Glucose 70 - 99 mg/dL 140  114  121   BUN 8 - 23 mg/dL _0 Creatinine 0.44 - 1.00 mg/dL 1.17  0.94  1.14   Sodium 135 - 145 mmol/L 140  138  140   Potassium 3.5 - 5.1 mmol/L 3.9  3.7  3.8   Chloride 98 - 111 mmol/L 109  106  110   CO2 22 - 32 mmol/L _1 Calcium 8.9 - 10.3 mg/dL 8.7  8.4  8.7   Total Protein 6.5 - 8.1 g/dL 6.6  6.1  6.6   Total Bilirubin 0.3 - 1.2 mg/dL 1.3  0.7  0.8   Alkaline Phos 38 - 126 U/L 77  63  65   AST 15 - 41 U/L _2 ALT 0 - 44 U/L _3 DIAGNOSTIC IMAGING:  I have independently reviewed the scans and discussed with the patient.  ASSESSMENT:  1.  Acute myeloid leukemia (Dx 11/03/2021): - Presentation to the hospital on 10/26/2021 with hemoglobin 4.7 and platelet count 71. - BMBX (11/03/2021): Hypercellular (80%) with increased blasts (20-25% by CD34).  Background hematopoiesis demonstrates trilineage dysplasia. - Chromosomes: 10, XX, +14[12]/48, XX, +8, +14[8].  Trisomy 14 commonly associated with MDS and t-MDS.  Trisomy 8 associated with AML, MDS and MPD. - FISH: Trisomy 8, - NGS: Positive  for RUNX1, U2AF1, BCOR, ZRSR2, NRAS, KRAS, and DNMT3A. - Cycle 1 of decitabine (20 mg/m2) x5 days and venetoclax 100 mg daily (while taking posaconazole) or 200 mg daily (while not taking posaconazole but on diltiazem) on 11/14/2021. - BMBX (01/09/2022): Negative for morphological disease.  2.  Social/family history: - She lives at home with her boyfriend.  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  No major chemical exposure.  Quit smoking 28 years ago.  Sister has breast cancer.  3.  Stage I left breast cancer: - Diagnosed 10 years ago, treated with lumpectomy followed by 6 weeks of radiation.  No chemotherapy.  Took antiestrogen therapy for 5  years.   PLAN:  1.  Acute myeloid leukemia: - She had bone marrow biopsy done at [facility] on 01/09/2022 which was negative for morphologic disease. - She is being evaluated for bone marrow transplant with Dr. Doy Mince. - She took her last dose of venetoclax on 01/14/2022. - Next cycle of decitabine 20 Mg/m2 x5 days with venetoclax 100 mg (400 mg daily when off posaconazole) x21 days daily to start on 01/29/2022. - Continue lab checks every Thursday.  We will check twice weekly during the week of treatment.  Transfuse for platelet count less than 20 and hemoglobin less than 8. - RTC 3 to 4 weeks for follow-up.  2.  Hypomagnesemia: - Cannot tolerate magnesium 3 times daily.  She will continue magnesium twice daily.  Magnesium is low at 1.4.  3.  Diarrhea: - She has chronic diarrhea since gnosis. - C. difficile x2 was negative.  May take Imodium as needed.  4.  Leg swellings: - Continue Lasix 20 mg in the mornings as needed.      Orders placed this encounter:  No orders of the defined types were placed in this encounter.     Derek Jack, MD [place] [phone]

## 2022-01-15 NOTE — Progress Notes (Signed)
Patient is taking Venetoclax as prescribed.  She has not missed any doses and reports no side effects at this time.

## 2022-01-15 NOTE — Progress Notes (Signed)
CRITICAL VALUE ALERT Critical value received:  WBC 0.3 and ANC 0.1. Date of notification:  01-15-2022 Time of notification: 09:18 am. Critical value read back:  Yes.   Nurse who received alert:  B.Toneisha Savary RN.  MD notified time and response:  Dr. Delton Coombes @ 09:19 am. [facility] patient standing orders followed.

## 2022-01-15 NOTE — Progress Notes (Signed)
Patient presents today for possible treatment, per Dr. Delton Coombes no treatment today, with possible treatment for the 27th, labs will also be once a week. Dressing changed and PICC flushed with good blood return noted. Patient discharged in satisfactory condition.

## 2022-01-15 NOTE — Patient Instructions (Addendum)
[place] at [facility] Discharge Instructions   You were seen and examined today by Dr. Delton Coombes.  He reviewed the results of your lab work which are stable.   We will continue weekly lab checks and transfuse as needed.  Treatment to resume on 01/29/22.   We will see you back in 3 weeks.    Thank you for choosing [place] at [facility] to provide your oncology and hematology care.  To afford each patient quality time with our provider, please arrive at least 15 minutes before your scheduled appointment time.   If you have a lab appointment with the [facility] please come in thru the Main Entrance and check in at the main information desk.  You need to re-schedule your appointment should you arrive 10 or more minutes late.  We strive to give you quality time with our providers, and arriving late affects you and other patients whose appointments are after yours.  Also, if you no show three or more times for appointments you may be dismissed from the clinic at the providers discretion.     Again, thank you for choosing [facility].  Our hope is that these requests will decrease the amount of time that you wait before being seen by our physicians.       _____________________________________________________________  Should you have questions after your visit to [facility], please contact our office at [phone] and follow the prompts.  Our office hours are 8:00 a.m. and 4:30 p.m. Monday - Friday.  Please note that voicemails left after 4:00 p.m. may not be returned until the following business day.  We are closed weekends and major holidays.  You do have access to a nurse 24-7, just call the main number to the clinic [phone] and do not press any options, hold on the line and a nurse will answer the phone.    For prescription refill requests, have your pharmacy contact our office and allow 72 hours.     Due to Covid, you will need to wear a mask upon entering the hospital. If you do not have a mask, a mask will be given to you at the Main Entrance upon arrival. For doctor visits, patients may have 1 support person age 69 or older with them. For treatment visits, patients can not have anyone with them due to social distancing guidelines and our immunocompromised population.

## 2022-01-16 ENCOUNTER — Ambulatory Visit: Payer: PPO

## 2022-01-16 LAB — SAMPLE TO BLOOD BANK

## 2022-01-17 ENCOUNTER — Ambulatory Visit: Payer: PPO

## 2022-01-18 ENCOUNTER — Ambulatory Visit: Payer: PPO

## 2022-01-19 ENCOUNTER — Ambulatory Visit: Payer: PPO

## 2022-01-22 ENCOUNTER — Inpatient Hospital Stay: Payer: PPO

## 2022-01-22 DIAGNOSIS — C9200 Acute myeloblastic leukemia, not having achieved remission: Secondary | ICD-10-CM

## 2022-01-22 DIAGNOSIS — Z5111 Encounter for antineoplastic chemotherapy: Secondary | ICD-10-CM | POA: Diagnosis not present

## 2022-01-22 LAB — CBC WITH DIFFERENTIAL/PLATELET
Abs Immature Granulocytes: 0 10*3/uL (ref 0.00–0.07)
Band Neutrophils: 2 %
Basophils Absolute: 0 10*3/uL (ref 0.0–0.1)
Basophils Relative: 0 %
Eosinophils Absolute: 0 10*3/uL (ref 0.0–0.5)
Eosinophils Relative: 0 %
HCT: 28.5 % — ABNORMAL LOW (ref 36.0–46.0)
Hemoglobin: 9 g/dL — ABNORMAL LOW (ref 12.0–15.0)
Lymphocytes Relative: 19 %
Lymphs Abs: 0.2 10*3/uL — ABNORMAL LOW (ref 0.7–4.0)
MCH: 30.2 pg (ref 26.0–34.0)
MCHC: 31.6 g/dL (ref 30.0–36.0)
MCV: 95.6 fL (ref 80.0–100.0)
Monocytes Absolute: 0.1 10*3/uL (ref 0.1–1.0)
Monocytes Relative: 8 %
Neutro Abs: 0.6 10*3/uL — ABNORMAL LOW (ref 1.7–7.7)
Neutrophils Relative %: 71 %
Platelets: 169 10*3/uL (ref 150–400)
RBC: 2.98 MIL/uL — ABNORMAL LOW (ref 3.87–5.11)
RDW: 22.5 % — ABNORMAL HIGH (ref 11.5–15.5)
WBC: 0.8 10*3/uL — CL (ref 4.0–10.5)
nRBC: 3.6 % — ABNORMAL HIGH (ref 0.0–0.2)

## 2022-01-22 LAB — COMPREHENSIVE METABOLIC PANEL
ALT: 13 U/L (ref 0–44)
AST: 26 U/L (ref 15–41)
Albumin: 3.4 g/dL — ABNORMAL LOW (ref 3.5–5.0)
Alkaline Phosphatase: 76 U/L (ref 38–126)
Anion gap: 7 (ref 5–15)
BUN: 19 mg/dL (ref 8–23)
CO2: 22 mmol/L (ref 22–32)
Calcium: 8.8 mg/dL — ABNORMAL LOW (ref 8.9–10.3)
Chloride: 110 mmol/L (ref 98–111)
Creatinine, Ser: 1.27 mg/dL — ABNORMAL HIGH (ref 0.44–1.00)
GFR, Estimated: 47 mL/min — ABNORMAL LOW (ref >60)
Glucose, Bld: 110 mg/dL — ABNORMAL HIGH (ref 70–99)
Potassium: 3.7 mmol/L (ref 3.5–5.1)
Sodium: 139 mmol/L (ref 135–145)
Total Bilirubin: 1.2 mg/dL (ref 0.3–1.2)
Total Protein: 7.1 g/dL (ref 6.5–8.1)

## 2022-01-22 LAB — SAMPLE TO BLOOD BANK

## 2022-01-22 LAB — MAGNESIUM: Magnesium: 1.6 mg/dL — ABNORMAL LOW (ref 1.7–2.4)

## 2022-01-22 MED ORDER — SODIUM CHLORIDE 0.9% FLUSH
10.0000 mL | INTRAVENOUS | Status: AC
  Administered 2022-01-22: 10 mL

## 2022-01-22 MED ORDER — HEPARIN SOD (PORK) LOCK FLUSH 100 UNIT/ML IV SOLN
500.0000 [IU] | Freq: Once | INTRAVENOUS | Status: AC
  Administered 2022-01-22: 500 [IU] via INTRAVENOUS

## 2022-01-22 NOTE — Patient Instructions (Signed)
[place]  Discharge Instructions: Thank you for choosing [facility] to provide your oncology and hematology care.  If you have a lab appointment with the [facility], please come in thru the Main Entrance and check in at the main information desk.  Wear comfortable clothing and clothing appropriate for easy access to any Portacath or PICC line.   We strive to give you quality time with your provider. You may need to reschedule your appointment if you arrive late (15 or more minutes).  Arriving late affects you and other patients whose appointments are after yours.  Also, if you miss three or more appointments without notifying the office, you may be dismissed from the clinic at the provider's discretion.      For prescription refill requests, have your pharmacy contact our office and allow 72 hours for refills to be completed.    Today you received PICC line flush, labs and dressing change.       To help prevent nausea and vomiting after your treatment, we encourage you to take your nausea medication as directed.  BELOW ARE SYMPTOMS THAT SHOULD BE REPORTED IMMEDIATELY: *FEVER GREATER THAN 100.4 F (38 C) OR HIGHER *CHILLS OR SWEATING *NAUSEA AND VOMITING THAT IS NOT CONTROLLED WITH YOUR NAUSEA MEDICATION *UNUSUAL SHORTNESS OF BREATH *UNUSUAL BRUISING OR BLEEDING *URINARY PROBLEMS (pain or burning when urinating, or frequent urination) *BOWEL PROBLEMS (unusual diarrhea, constipation, pain near the anus) TENDERNESS IN MOUTH AND THROAT WITH OR WITHOUT PRESENCE OF ULCERS (sore throat, sores in mouth, or a toothache) UNUSUAL RASH, SWELLING OR PAIN  UNUSUAL VAGINAL DISCHARGE OR ITCHING   Items with * indicate a potential emergency and should be followed up as soon as possible or go to the Emergency Department if any problems should occur.  Please show the CHEMOTHERAPY ALERT CARD or IMMUNOTHERAPY ALERT CARD at check-in to the Emergency Department and  triage nurse.  Should you have questions after your visit or need to cancel or reschedule your appointment, please contact [place] [phone]  and follow the prompts.  Office hours are 8:00 a.m. to 4:30 p.m. Monday - Friday. Please note that voicemails left after 4:00 p.m. may not be returned until the following business day.  We are closed weekends and major holidays. You have access to a nurse at all times for urgent questions. Please call the main number to the clinic [phone] and follow the prompts.  For any non-urgent questions, you may also contact your provider using MyChart. We now offer e-Visits for anyone 60 and older to request care online for non-urgent symptoms. For details visit [email or web address].   Also download the MyChart app! Go to the app store, search "MyChart", open the app, select [place], and log in with your MyChart username and password.  Masks are optional in the cancer centers. If you would like for your care team to wear a mask while they are taking care of you, please let them know. You may have one support person who is at least 65 years old accompany you for your appointments.

## 2022-01-22 NOTE — Progress Notes (Signed)
CRITICAL VALUE ALERT Critical value received:  WBC 0.8. Date of notification:  01-22-2022 Time of notification: 14:45 pm. Critical value read back:  Yes.   Nurse who received alert:  B.Donette Mainwaring RN MD notified time and response:  Dr. Delton Coombes @ 14:49pm. [facility] patient with standing orders. Labs faxed to [facility].

## 2022-01-22 NOTE — Progress Notes (Signed)
Sherald Hess presented for PICC dressing change and flush.  See IV assessment in docflowsheets for PICC details. PICC located left arm.  Good blood return present in lumen #1 and lumen #2.  PICC flushed with 83m of  NS and 250U Heparin, in both lumen #1 and lumen #2. See MAR for further details.  CTIANN SAHAtolerated procedure well and without incident. Discharged from clinic ambulatory in stable condition. Alert and oriented x 3. [facility]

## 2022-01-22 NOTE — Progress Notes (Signed)
Placed call to [facility] to report S. Roberston-Dralle, NP Magnesium level 1.6 Orders received to infuse 2 grams of Magnesium Sulfate 01-23-2022

## 2022-01-22 NOTE — Progress Notes (Signed)
Patient aware 10:00 appointment tomorrow for 2 grams of Magnesium Sulfate. Patient called an aware.

## 2022-01-23 ENCOUNTER — Inpatient Hospital Stay: Payer: PPO

## 2022-01-23 DIAGNOSIS — Z5111 Encounter for antineoplastic chemotherapy: Secondary | ICD-10-CM | POA: Diagnosis not present

## 2022-01-23 MED ORDER — SODIUM CHLORIDE 0.9 % IV SOLN: INTRAVENOUS | Status: DC

## 2022-01-23 MED ORDER — MAGNESIUM SULFATE 2 GM/50ML IV SOLN
2.0000 g | Freq: Once | INTRAVENOUS | Status: AC
  Administered 2022-01-23: 2 g via INTRAVENOUS
  Filled 2022-01-23: qty 50

## 2022-01-23 MED ORDER — HEPARIN SOD (PORK) LOCK FLUSH 100 UNIT/ML IV SOLN
500.0000 [IU] | Freq: Once | INTRAVENOUS | Status: AC
  Administered 2022-01-23: 500 [IU] via INTRAVENOUS

## 2022-01-23 MED ORDER — SODIUM CHLORIDE 0.9% FLUSH
10.0000 mL | INTRAVENOUS | Status: DC | PRN
  Administered 2022-01-23: 10 mL via INTRAVENOUS

## 2022-01-23 NOTE — Progress Notes (Signed)
Chaplain engaged in an initial visit with Kaitlyn Hull and her family.  Chaplain explained role, offered support, and presence.    01/23/22 1100  Clinical Encounter Type  Visited With Patient and family together  Visit Type Initial

## 2022-01-23 NOTE — Patient Instructions (Signed)
[place]  Discharge Instructions: Thank you for choosing [place] to provide your oncology and hematology care.  If you have a lab appointment with the [facility], please come in thru the Main Entrance and check in at the main information desk.  Wear comfortable clothing and clothing appropriate for easy access to any Portacath or PICC line.   We strive to give you quality time with your provider. You may need to reschedule your appointment if you arrive late (15 or more minutes).  Arriving late affects you and other patients whose appointments are after yours.  Also, if you miss three or more appointments without notifying the office, you may be dismissed from the clinic at the provider's discretion.      For prescription refill requests, have your pharmacy contact our office and allow 72 hours for refills to be completed.    Today you received 2g IV magnesium sulfate     BELOW ARE SYMPTOMS THAT SHOULD BE REPORTED IMMEDIATELY: *FEVER GREATER THAN 100.4 F (38 C) OR HIGHER *CHILLS OR SWEATING *NAUSEA AND VOMITING THAT IS NOT CONTROLLED WITH YOUR NAUSEA MEDICATION *UNUSUAL SHORTNESS OF BREATH *UNUSUAL BRUISING OR BLEEDING *URINARY PROBLEMS (pain or burning when urinating, or frequent urination) *BOWEL PROBLEMS (unusual diarrhea, constipation, pain near the anus) TENDERNESS IN MOUTH AND THROAT WITH OR WITHOUT PRESENCE OF ULCERS (sore throat, sores in mouth, or a toothache) UNUSUAL RASH, SWELLING OR PAIN  UNUSUAL VAGINAL DISCHARGE OR ITCHING   Items with * indicate a potential emergency and should be followed up as soon as possible or go to the Emergency Department if any problems should occur.  Please show the CHEMOTHERAPY ALERT CARD or IMMUNOTHERAPY ALERT CARD at check-in to the Emergency Department and triage nurse.  Should you have questions after your visit or need to cancel or reschedule your appointment, please contact [place] [phone]  and follow the prompts.  Office hours are 8:00 a.m. to 4:30 p.m. Monday - Friday. Please note that voicemails left after 4:00 p.m. may not be returned until the following business day.  We are closed weekends and major holidays. You have access to a nurse at all times for urgent questions. Please call the main number to the clinic [phone] and follow the prompts.  For any non-urgent questions, you may also contact your provider using MyChart. We now offer e-Visits for anyone 39 and older to request care online for non-urgent symptoms. For details visit [email or web address].   Also download the MyChart app! Go to the app store, search "MyChart", open the app, select [place], and log in with your MyChart username and password.  Masks are optional in the cancer centers. If you would like for your care team to wear a mask while they are taking care of you, please let them know. You may have one support person who is at least 65 years old accompany you for your appointments.

## 2022-01-23 NOTE — Progress Notes (Signed)
Pt presents today for 2g IV magnesium sulfate per provider's order. Vital signs stable and pt voiced no new complaints at this time.  2g IV magnesium sulfate given today per MD orders. Tolerated infusion without adverse affects. Vital signs stable. No complaints at this time. Discharged from clinic ambulatory in stable condition. Alert and oriented x 3. F/U with [facility] as scheduled.

## 2022-01-29 ENCOUNTER — Inpatient Hospital Stay: Payer: PPO

## 2022-01-29 VITALS — BP 121/69 | HR 85 | Temp 98.0°F | Resp 18 | Wt 253.1 lb

## 2022-01-29 DIAGNOSIS — Z5111 Encounter for antineoplastic chemotherapy: Secondary | ICD-10-CM | POA: Diagnosis not present

## 2022-01-29 DIAGNOSIS — Z452 Encounter for adjustment and management of vascular access device: Secondary | ICD-10-CM

## 2022-01-29 DIAGNOSIS — C9200 Acute myeloblastic leukemia, not having achieved remission: Secondary | ICD-10-CM

## 2022-01-29 LAB — CBC WITH DIFFERENTIAL/PLATELET
Abs Immature Granulocytes: 0.01 10*3/uL (ref 0.00–0.07)
Basophils Absolute: 0 10*3/uL (ref 0.0–0.1)
Basophils Relative: 0 %
Eosinophils Absolute: 0 10*3/uL (ref 0.0–0.5)
Eosinophils Relative: 0 %
HCT: 29.1 % — ABNORMAL LOW (ref 36.0–46.0)
Hemoglobin: 9.2 g/dL — ABNORMAL LOW (ref 12.0–15.0)
Immature Granulocytes: 1 %
Lymphocytes Relative: 25 %
Lymphs Abs: 0.4 10*3/uL — ABNORMAL LOW (ref 0.7–4.0)
MCH: 31 pg (ref 26.0–34.0)
MCHC: 31.6 g/dL (ref 30.0–36.0)
MCV: 98 fL (ref 80.0–100.0)
Monocytes Absolute: 0.3 10*3/uL (ref 0.1–1.0)
Monocytes Relative: 21 %
Neutro Abs: 0.8 10*3/uL — ABNORMAL LOW (ref 1.7–7.7)
Neutrophils Relative %: 53 %
Platelets: 116 10*3/uL — ABNORMAL LOW (ref 150–400)
RBC: 2.97 MIL/uL — ABNORMAL LOW (ref 3.87–5.11)
RDW: 24 % — ABNORMAL HIGH (ref 11.5–15.5)
WBC: 1.5 10*3/uL — ABNORMAL LOW (ref 4.0–10.5)
nRBC: 4.8 % — ABNORMAL HIGH (ref 0.0–0.2)

## 2022-01-29 LAB — COMPREHENSIVE METABOLIC PANEL
ALT: 12 U/L (ref 0–44)
AST: 25 U/L (ref 15–41)
Albumin: 3.4 g/dL — ABNORMAL LOW (ref 3.5–5.0)
Alkaline Phosphatase: 87 U/L (ref 38–126)
Anion gap: 9 (ref 5–15)
BUN: 16 mg/dL (ref 8–23)
CO2: 23 mmol/L (ref 22–32)
Calcium: 8.7 mg/dL — ABNORMAL LOW (ref 8.9–10.3)
Chloride: 109 mmol/L (ref 98–111)
Creatinine, Ser: 0.98 mg/dL (ref 0.44–1.00)
GFR, Estimated: >60 mL/min (ref >60)
Glucose, Bld: 115 mg/dL — ABNORMAL HIGH (ref 70–99)
Potassium: 3.9 mmol/L (ref 3.5–5.1)
Sodium: 141 mmol/L (ref 135–145)
Total Bilirubin: 0.9 mg/dL (ref 0.3–1.2)
Total Protein: 6.5 g/dL (ref 6.5–8.1)

## 2022-01-29 LAB — SAMPLE TO BLOOD BANK

## 2022-01-29 LAB — MAGNESIUM: Magnesium: 1.3 mg/dL — ABNORMAL LOW (ref 1.7–2.4)

## 2022-01-29 MED ORDER — SODIUM CHLORIDE 0.9 % IV SOLN: Freq: Once | INTRAVENOUS | Status: AC

## 2022-01-29 MED ORDER — HEPARIN SOD (PORK) LOCK FLUSH 100 UNIT/ML IV SOLN
250.0000 [IU] | Freq: Once | INTRAVENOUS | Status: AC | PRN
  Administered 2022-01-29: 250 [IU]

## 2022-01-29 MED ORDER — SODIUM CHLORIDE 0.9% FLUSH
10.0000 mL | INTRAVENOUS | Status: DC | PRN
  Administered 2022-01-29: 10 mL

## 2022-01-29 MED ORDER — PALONOSETRON HCL INJECTION 0.25 MG/5ML
0.2500 mg | Freq: Once | INTRAVENOUS | Status: AC
  Administered 2022-01-29: 0.25 mg via INTRAVENOUS
  Filled 2022-01-29: qty 5

## 2022-01-29 MED ORDER — SODIUM CHLORIDE 0.9 % IV SOLN
20.0000 mg/m2 | Freq: Once | INTRAVENOUS | Status: AC
  Administered 2022-01-29: 45 mg via INTRAVENOUS
  Filled 2022-01-29: qty 9

## 2022-01-29 MED ORDER — SODIUM CHLORIDE 0.9% FLUSH
10.0000 mL | Freq: Once | INTRAVENOUS | Status: AC
  Administered 2022-01-29: 10 mL via INTRAVENOUS

## 2022-01-29 MED ORDER — HEPARIN SOD (PORK) LOCK FLUSH 100 UNIT/ML IV SOLN
250.0000 [IU] | Freq: Once | INTRAVENOUS | Status: AC
  Administered 2022-01-29: 250 [IU] via INTRAVENOUS

## 2022-01-29 NOTE — Patient Instructions (Signed)
Kaitlyn Hull  Discharge Instructions: Thank you for choosing [facility] to provide your oncology and hematology care.  If you have a lab appointment with the [facility], please come in thru the Main Entrance and check in at the main information desk.  Wear comfortable clothing and clothing appropriate for easy access to any Portacath or PICC line.   We strive to give you quality time with your provider. You may need to reschedule your appointment if you arrive late (15 or more minutes).  Arriving late affects you and other patients whose appointments are after yours.  Also, if you miss three or more appointments without notifying the office, you may be dismissed from the clinic at the provider's discretion.      For prescription refill requests, have your pharmacy contact our office and allow 72 hours for refills to be completed.    Today you received the following chemotherapy and/or immunotherapy agents Decitabine, return as scheduled.   To help prevent nausea and vomiting after your treatment, we encourage you to take your nausea medication as directed.  BELOW ARE SYMPTOMS THAT SHOULD BE REPORTED IMMEDIATELY: *FEVER GREATER THAN 100.4 F (38 C) OR HIGHER *CHILLS OR SWEATING *NAUSEA AND VOMITING THAT IS NOT CONTROLLED WITH YOUR NAUSEA MEDICATION *UNUSUAL SHORTNESS OF BREATH *UNUSUAL BRUISING OR BLEEDING *URINARY PROBLEMS (pain or burning when urinating, or frequent urination) *BOWEL PROBLEMS (unusual diarrhea, constipation, pain near the anus) TENDERNESS IN MOUTH AND THROAT WITH OR WITHOUT PRESENCE OF ULCERS (sore throat, sores in mouth, or a toothache) UNUSUAL RASH, SWELLING OR PAIN  UNUSUAL VAGINAL DISCHARGE OR ITCHING   Items with * indicate a potential emergency and should be followed up as soon as possible or go to the Emergency Department if any problems should occur.  Please show the CHEMOTHERAPY ALERT CARD or IMMUNOTHERAPY ALERT CARD at  check-in to the Emergency Department and triage nurse.  Should you have questions after your visit or need to cancel or reschedule your appointment, please contact [facility] [phone]  and follow the prompts.  Office hours are 8:00 a.m. to 4:30 p.m. Monday - Friday. Please note that voicemails left after 4:00 p.m. may not be returned until the following business day.  We are closed weekends and major holidays. You have access to a nurse at all times for urgent questions. Please call the main number to the clinic [phone] and follow the prompts.  For any non-urgent questions, you may also contact your provider using MyChart. We now offer e-Visits for anyone 68 and older to request care online for non-urgent symptoms. For details visit [email or web address].   Also download the MyChart app! Go to the app store, search "MyChart", open the app, select [facility], and log in with your MyChart username and password.  Masks are optional in the cancer centers. If you would like for your care team to wear a mask while they are taking care of you, please let them know. You may have one support person who is at least 65 years old accompany you for your appointments.

## 2022-01-29 NOTE — Progress Notes (Signed)
Patient presents today for Decitabine, patient's ANC is 0.8, Magnesium 1.3, per Dr, Delton Coombes patient is okay to receive treatment today with additional orders received for 4 g of Magnesium. Patient will receive magnesium tomorrow.  Patient tolerated chemotherapy with no complaints voiced. Side effects with management reviewed understanding verbalized. Port site clean and dry with no bruising or swelling noted at site. Good blood return noted before and after administration of chemotherapy. Patient left in satisfactory condition with VSS and no s/s of distress noted.

## 2022-01-30 ENCOUNTER — Inpatient Hospital Stay: Payer: PPO

## 2022-01-30 DIAGNOSIS — Z5111 Encounter for antineoplastic chemotherapy: Secondary | ICD-10-CM | POA: Diagnosis not present

## 2022-01-30 DIAGNOSIS — C9200 Acute myeloblastic leukemia, not having achieved remission: Secondary | ICD-10-CM

## 2022-01-30 MED ORDER — SODIUM CHLORIDE 0.9% FLUSH
10.0000 mL | INTRAVENOUS | Status: DC | PRN
  Administered 2022-01-30: 10 mL

## 2022-01-30 MED ORDER — SODIUM CHLORIDE 0.9 % IV SOLN: Freq: Once | INTRAVENOUS | Status: AC

## 2022-01-30 MED ORDER — MAGNESIUM SULFATE 2 GM/50ML IV SOLN
2.0000 g | Freq: Once | INTRAVENOUS | Status: AC
  Administered 2022-01-30: 2 g via INTRAVENOUS
  Filled 2022-01-30: qty 50

## 2022-01-30 MED ORDER — HEPARIN SOD (PORK) LOCK FLUSH 100 UNIT/ML IV SOLN
500.0000 [IU] | Freq: Once | INTRAVENOUS | Status: AC | PRN
  Administered 2022-01-30: 500 [IU]

## 2022-01-30 MED ORDER — SODIUM CHLORIDE 0.9 % IV SOLN
20.0000 mg/m2 | Freq: Once | INTRAVENOUS | Status: AC
  Administered 2022-01-30: 45 mg via INTRAVENOUS
  Filled 2022-01-30: qty 9

## 2022-01-30 NOTE — Progress Notes (Signed)
Patient presented today for Decitabine and 4 g of magnesium. Patient tolerated chemotherapy with no complaints voiced. Side effects with management reviewed understanding verbalized. PICC site clean and dry with no bruising or swelling noted at site. Good blood return noted before and after administration of chemotherapy. Patient left in satisfactory condition with VSS and no s/s of distress noted.

## 2022-01-30 NOTE — Progress Notes (Signed)
Chaplain checked in with Kaitlyn Hull who was doing well today.  No needs at this time.    01/30/22 1100  Clinical Encounter Type  Visited With Patient  Visit Type Follow-up

## 2022-01-30 NOTE — Patient Instructions (Signed)
[facility]  Discharge Instructions: Thank you for choosing [facility] to provide your oncology and hematology care.  If you have a lab appointment with the [facility], please come in thru the Main Entrance and check in at the main information desk.  Wear comfortable clothing and clothing appropriate for easy access to any Portacath or PICC line.   We strive to give you quality time with your provider. You may need to reschedule your appointment if you arrive late (15 or more minutes).  Arriving late affects you and other patients whose appointments are after yours.  Also, if you miss three or more appointments without notifying the office, you may be dismissed from the clinic at the provider's discretion.      For prescription refill requests, have your pharmacy contact our office and allow 72 hours for refills to be completed.    Today you received the following chemotherapy and/or immunotherapy agents Decitabine and 4g of magnesium, return as scheduled.   To help prevent nausea and vomiting after your treatment, we encourage you to take your nausea medication as directed.  BELOW ARE SYMPTOMS THAT SHOULD BE REPORTED IMMEDIATELY: *FEVER GREATER THAN 100.4 F (38 C) OR HIGHER *CHILLS OR SWEATING *NAUSEA AND VOMITING THAT IS NOT CONTROLLED WITH YOUR NAUSEA MEDICATION *UNUSUAL SHORTNESS OF BREATH *UNUSUAL BRUISING OR BLEEDING *URINARY PROBLEMS (pain or burning when urinating, or frequent urination) *BOWEL PROBLEMS (unusual diarrhea, constipation, pain near the anus) TENDERNESS IN MOUTH AND THROAT WITH OR WITHOUT PRESENCE OF ULCERS (sore throat, sores in mouth, or a toothache) UNUSUAL RASH, SWELLING OR PAIN  UNUSUAL VAGINAL DISCHARGE OR ITCHING   Items with * indicate a potential emergency and should be followed up as soon as possible or go to the Emergency Department if any problems should occur.  Please show the CHEMOTHERAPY ALERT CARD or  IMMUNOTHERAPY ALERT CARD at check-in to the Emergency Department and triage nurse.  Should you have questions after your visit or need to cancel or reschedule your appointment, please contact [facility] [phone]  and follow the prompts.  Office hours are 8:00 a.m. to 4:30 p.m. Monday - Friday. Please note that voicemails left after 4:00 p.m. may not be returned until the following business day.  We are closed weekends and major holidays. You have access to a nurse at all times for urgent questions. Please call the main number to the clinic [phone] and follow the prompts.  For any non-urgent questions, you may also contact your provider using MyChart. We now offer e-Visits for anyone 14 and older to request care online for non-urgent symptoms. For details visit [email or web address].   Also download the MyChart app! Go to the app store, search "MyChart", open the app, select [facility], and log in with your MyChart username and password.  Masks are optional in the cancer centers. If you would like for your care team to wear a mask while they are taking care of you, please let them know. You may have one support person who is at least 65 years old accompany you for your appointments.

## 2022-01-31 ENCOUNTER — Inpatient Hospital Stay: Payer: PPO

## 2022-01-31 VITALS — BP 118/65 | HR 80 | Temp 97.8°F | Resp 18

## 2022-01-31 DIAGNOSIS — C9200 Acute myeloblastic leukemia, not having achieved remission: Secondary | ICD-10-CM

## 2022-01-31 DIAGNOSIS — Z5111 Encounter for antineoplastic chemotherapy: Secondary | ICD-10-CM | POA: Diagnosis not present

## 2022-01-31 MED ORDER — SODIUM CHLORIDE 0.9 % IV SOLN: Freq: Once | INTRAVENOUS | Status: AC

## 2022-01-31 MED ORDER — PALONOSETRON HCL INJECTION 0.25 MG/5ML
0.2500 mg | Freq: Once | INTRAVENOUS | Status: AC
  Administered 2022-01-31: 0.25 mg via INTRAVENOUS
  Filled 2022-01-31: qty 5

## 2022-01-31 MED ORDER — SODIUM CHLORIDE 0.9 % IV SOLN
20.0000 mg/m2 | Freq: Once | INTRAVENOUS | Status: AC
  Administered 2022-01-31: 45 mg via INTRAVENOUS
  Filled 2022-01-31: qty 9

## 2022-01-31 MED ORDER — HEPARIN SOD (PORK) LOCK FLUSH 100 UNIT/ML IV SOLN
500.0000 [IU] | Freq: Once | INTRAVENOUS | Status: AC | PRN
  Administered 2022-01-31: 500 [IU]

## 2022-01-31 MED ORDER — SODIUM CHLORIDE 0.9% FLUSH
10.0000 mL | INTRAVENOUS | Status: DC | PRN
  Administered 2022-01-31: 10 mL

## 2022-01-31 NOTE — Patient Instructions (Signed)
[facility]  Discharge Instructions: Thank you for choosing [facility] to provide your oncology and hematology care.  If you have a lab appointment with the [facility], please come in thru the Main Entrance and check in at the main information desk.  Wear comfortable clothing and clothing appropriate for easy access to any Portacath or PICC line.   We strive to give you quality time with your provider. You may need to reschedule your appointment if you arrive late (15 or more minutes).  Arriving late affects you and other patients whose appointments are after yours.  Also, if you miss three or more appointments without notifying the office, you may be dismissed from the clinic at the provider's discretion.      For prescription refill requests, have your pharmacy contact our office and allow 72 hours for refills to be completed.    Today you received the following chemotherapy and/or immunotherapy agents Decitabine.  Decitabine Injection What is this medication? DECITABINE (dee SYE ta been) treats blood and bone marrow cancers. It works by slowing down the growth of cancer cells. This medicine may be used for other purposes; ask your health care provider or pharmacist if you have questions. COMMON BRAND NAME(S): Dacogen What should I tell my care team before I take this medication? They need to know if you have any of these conditions: Infection Kidney disease Liver disease An unusual or allergic reaction to decitabine, other medications, foods, dyes, or preservatives Pregnant or trying to get pregnant Breast-feeding How should I use this medication? This medication is infused into a vein. It is given by your care team in a hospital or clinic setting. Talk to your care team about the use of this medication in children. Special care may be needed. Overdosage: If you think you have taken too much of this medicine contact a poison control center or  emergency room at once. NOTE: This medicine is only for you. Do not share this medicine with others. What if I miss a dose? Keep appointments for follow-up doses. It is important not to miss your dose. Call your care team if you are unable to keep an appointment. What may interact with this medication? Interactions are not expected. This list may not describe all possible interactions. Give your health care provider a list of all the medicines, herbs, non-prescription drugs, or dietary supplements you use. Also tell them if you smoke, drink alcohol, or use illegal drugs. Some items may interact with your medicine. What should I watch for while using this medication? Visit your care team for regular checks on your progress. You may need blood work while taking this medication. This medication may make you feel generally unwell. This is not uncommon as chemotherapy can affect healthy cells as well as cancer cells. Report any side effects. Continue your course of treatment even though you feel ill unless your care team tells you to stop. This medication may increase your risk of getting an infection. Call your care team for advice if you get a fever, chills, sore throat, or other symptoms of a cold or flu. Do not treat yourself. Try to avoid being around people who are sick. Be careful brushing or flossing your teeth or using a toothpick because you may get an infection or bleed more easily. If you have any dental work done, tell your dentist you are receiving this medication. Call your care team if you are around anyone with measles, chickenpox, or  if you develop sores or blisters that do not heal properly. Avoid taking medications that contain aspirin, acetaminophen, ibuprofen, naproxen, or ketoprofen unless instructed by your care team. These medications may hide a fever. Talk to your care team if you or your partner wish to become pregnant or think either of you might be pregnant. This medication can  cause serious birth defects if taken during pregnancy or for 6 months after the last dose. A negative pregnancy test is required before starting this medication. A reliable form of contraception is recommended while taking this medication and for 6 months after the last dose. Talk to your care team about reliable forms of contraception. Do not father a child while taking this medication or for 3 months after the last dose. Use a condom while having sex during this time period. Do not breast-feed while taking this medication and for 2 weeks after the last dose. This medication may cause infertility. Talk to your care team if you are concerned about your fertility. What side effects may I notice from receiving this medication? Side effects that you should report to your care team as soon as possible: Allergic reactions--skin rash, itching, hives, swelling of the face, lips, tongue, or throat Infection--fever, chills, cough, sore throat, wounds that don't heal, pain or trouble when passing urine, general feeling of discomfort or being unwell Low red blood cell level--unusual weakness or fatigue, dizziness, headache, trouble breathing Unusual bruising or bleeding Side effects that usually do not require medical attention (report to your care team if they continue or are bothersome): Constipation Diarrhea Fatigue Nausea Pain, redness, or swelling with sores inside the mouth or throat Stomach pain This list may not describe all possible side effects. Call your doctor for medical advice about side effects. You may report side effects to FDA at 1-800-FDA-1088. Where should I keep my medication? This medication is given in a hospital or clinic. It will not be stored at home. NOTE: This sheet is a summary. It may not cover all possible information. If you have questions about this medicine, talk to your doctor, pharmacist, or health care provider.  2023 Elsevier/Gold Standard (2021-05-29 00:00:00)         To help prevent nausea and vomiting after your treatment, we encourage you to take your nausea medication as directed.  BELOW ARE SYMPTOMS THAT SHOULD BE REPORTED IMMEDIATELY: *FEVER GREATER THAN 100.4 F (38 C) OR HIGHER *CHILLS OR SWEATING *NAUSEA AND VOMITING THAT IS NOT CONTROLLED WITH YOUR NAUSEA MEDICATION *UNUSUAL SHORTNESS OF BREATH *UNUSUAL BRUISING OR BLEEDING *URINARY PROBLEMS (pain or burning when urinating, or frequent urination) *BOWEL PROBLEMS (unusual diarrhea, constipation, pain near the anus) TENDERNESS IN MOUTH AND THROAT WITH OR WITHOUT PRESENCE OF ULCERS (sore throat, sores in mouth, or a toothache) UNUSUAL RASH, SWELLING OR PAIN  UNUSUAL VAGINAL DISCHARGE OR ITCHING   Items with * indicate a potential emergency and should be followed up as soon as possible or go to the Emergency Department if any problems should occur.  Please show the CHEMOTHERAPY ALERT CARD or IMMUNOTHERAPY ALERT CARD at check-in to the Emergency Department and triage nurse.  Should you have questions after your visit or need to cancel or reschedule your appointment, please contact [facility] [phone]  and follow the prompts.  Office hours are 8:00 a.m. to 4:30 p.m. Monday - Friday. Please note that voicemails left after 4:00 p.m. may not be returned until the following business day.  We are closed weekends and major  holidays. You have access to a nurse at all times for urgent questions. Please call the main number to the clinic [phone] and follow the prompts.  For any non-urgent questions, you may also contact your provider using MyChart. We now offer e-Visits for anyone 60 and older to request care online for non-urgent symptoms. For details visit [email or web address].   Also download the MyChart app! Go to the app store, search "MyChart", open the app, select [facility], and log in with your MyChart username and password.  Masks are optional in the cancer  centers. If you would like for your care team to wear a mask while they are taking care of you, please let them know. You may have one support person who is at least 65 years old accompany you for your appointments.

## 2022-01-31 NOTE — Progress Notes (Signed)
Patient presents today for Decitabine infusion.  Patient is in satisfactory condition with no new complaints voiced.  Vital signs are stable.  We will proceed with treatment per MD orders.   Patient tolerated treatment well with no complaints voiced.  Patient left ambulatory in stable condition.  Vital signs stable at discharge.  Follow up as scheduled.

## 2022-02-01 ENCOUNTER — Inpatient Hospital Stay: Payer: PPO

## 2022-02-01 VITALS — BP 123/89 | HR 78 | Temp 97.7°F | Resp 20

## 2022-02-01 DIAGNOSIS — C9200 Acute myeloblastic leukemia, not having achieved remission: Secondary | ICD-10-CM

## 2022-02-01 DIAGNOSIS — Z5111 Encounter for antineoplastic chemotherapy: Secondary | ICD-10-CM | POA: Diagnosis not present

## 2022-02-01 MED ORDER — SODIUM CHLORIDE 0.9 % IV SOLN
20.0000 mg/m2 | Freq: Once | INTRAVENOUS | Status: AC
  Administered 2022-02-01: 45 mg via INTRAVENOUS
  Filled 2022-02-01: qty 9

## 2022-02-01 MED ORDER — SODIUM CHLORIDE 0.9% FLUSH
10.0000 mL | INTRAVENOUS | Status: DC | PRN
  Administered 2022-02-01 (×2): 10 mL

## 2022-02-01 MED ORDER — HEPARIN SOD (PORK) LOCK FLUSH 100 UNIT/ML IV SOLN
250.0000 [IU] | Freq: Once | INTRAVENOUS | Status: AC | PRN
  Administered 2022-02-01: 250 [IU]

## 2022-02-01 MED ORDER — SODIUM CHLORIDE 0.9 % IV SOLN: Freq: Once | INTRAVENOUS | Status: AC

## 2022-02-01 NOTE — Progress Notes (Signed)
Pt presents today for D4 Decitabine per provider's order. Vital signs stable and pt voiced no new complaints at this time.  D4 Decitabine given today per MD orders. Tolerated infusion without adverse affects. Vital signs stable. No complaints at this time. Discharged from clinic ambulatory in stable condition. Alert and oriented x 3. F/U with [facility] as scheduled.

## 2022-02-01 NOTE — Patient Instructions (Signed)
[facility]  Discharge Instructions: Thank you for choosing [facility] to provide your oncology and hematology care.  If you have a lab appointment with the [facility], please come in thru the Main Entrance and check in at the main information desk.  Wear comfortable clothing and clothing appropriate for easy access to any Portacath or PICC line.   We strive to give you quality time with your provider. You may need to reschedule your appointment if you arrive late (15 or more minutes).  Arriving late affects you and other patients whose appointments are after yours.  Also, if you miss three or more appointments without notifying the office, you may be dismissed from the clinic at the provider's discretion.      For prescription refill requests, have your pharmacy contact our office and allow 72 hours for refills to be completed.    Today you received the following chemotherapy and/or immunotherapy agents D4 Decitabine   To help prevent nausea and vomiting after your treatment, we encourage you to take your nausea medication as directed.  Decitabine Injection What is this medication? DECITABINE (dee SYE ta been) treats blood and bone marrow cancers. It works by slowing down the growth of cancer cells. This medicine may be used for other purposes; ask your health care provider or pharmacist if you have questions. COMMON BRAND NAME(S): Dacogen What should I tell my care team before I take this medication? They need to know if you have any of these conditions: Infection Kidney disease Liver disease An unusual or allergic reaction to decitabine, other medications, foods, dyes, or preservatives Pregnant or trying to get pregnant Breast-feeding How should I use this medication? This medication is infused into a vein. It is given by your care team in a hospital or clinic setting. Talk to your care team about the use of this medication in children. Special  care may be needed. Overdosage: If you think you have taken too much of this medicine contact a poison control center or emergency room at once. NOTE: This medicine is only for you. Do not share this medicine with others. What if I miss a dose? Keep appointments for follow-up doses. It is important not to miss your dose. Call your care team if you are unable to keep an appointment. What may interact with this medication? Interactions are not expected. This list may not describe all possible interactions. Give your health care provider a list of all the medicines, herbs, non-prescription drugs, or dietary supplements you use. Also tell them if you smoke, drink alcohol, or use illegal drugs. Some items may interact with your medicine. What should I watch for while using this medication? Visit your care team for regular checks on your progress. You may need blood work while taking this medication. This medication may make you feel generally unwell. This is not uncommon as chemotherapy can affect healthy cells as well as cancer cells. Report any side effects. Continue your course of treatment even though you feel ill unless your care team tells you to stop. This medication may increase your risk of getting an infection. Call your care team for advice if you get a fever, chills, sore throat, or other symptoms of a cold or flu. Do not treat yourself. Try to avoid being around people who are sick. Be careful brushing or flossing your teeth or using a toothpick because you may get an infection or bleed more easily. If you have any dental work  done, tell your dentist you are receiving this medication. Call your care team if you are around anyone with measles, chickenpox, or if you develop sores or blisters that do not heal properly. Avoid taking medications that contain aspirin, acetaminophen, ibuprofen, naproxen, or ketoprofen unless instructed by your care team. These medications may hide a fever. Talk to  your care team if you or your partner wish to become pregnant or think either of you might be pregnant. This medication can cause serious birth defects if taken during pregnancy or for 6 months after the last dose. A negative pregnancy test is required before starting this medication. A reliable form of contraception is recommended while taking this medication and for 6 months after the last dose. Talk to your care team about reliable forms of contraception. Do not father a child while taking this medication or for 3 months after the last dose. Use a condom while having sex during this time period. Do not breast-feed while taking this medication and for 2 weeks after the last dose. This medication may cause infertility. Talk to your care team if you are concerned about your fertility. What side effects may I notice from receiving this medication? Side effects that you should report to your care team as soon as possible: Allergic reactions--skin rash, itching, hives, swelling of the face, lips, tongue, or throat Infection--fever, chills, cough, sore throat, wounds that don't heal, pain or trouble when passing urine, general feeling of discomfort or being unwell Low red blood cell level--unusual weakness or fatigue, dizziness, headache, trouble breathing Unusual bruising or bleeding Side effects that usually do not require medical attention (report to your care team if they continue or are bothersome): Constipation Diarrhea Fatigue Nausea Pain, redness, or swelling with sores inside the mouth or throat Stomach pain This list may not describe all possible side effects. Call your doctor for medical advice about side effects. You may report side effects to FDA at 1-800-FDA-1088. Where should I keep my medication? This medication is given in a hospital or clinic. It will not be stored at home. NOTE: This sheet is a summary. It may not cover all possible information. If you have questions about this  medicine, talk to your doctor, pharmacist, or health care provider.  2023 Elsevier/Gold Standard (2021-05-29 00:00:00)   BELOW ARE SYMPTOMS THAT SHOULD BE REPORTED IMMEDIATELY: *FEVER GREATER THAN 100.4 F (38 C) OR HIGHER *CHILLS OR SWEATING *NAUSEA AND VOMITING THAT IS NOT CONTROLLED WITH YOUR NAUSEA MEDICATION *UNUSUAL SHORTNESS OF BREATH *UNUSUAL BRUISING OR BLEEDING *URINARY PROBLEMS (pain or burning when urinating, or frequent urination) *BOWEL PROBLEMS (unusual diarrhea, constipation, pain near the anus) TENDERNESS IN MOUTH AND THROAT WITH OR WITHOUT PRESENCE OF ULCERS (sore throat, sores in mouth, or a toothache) UNUSUAL RASH, SWELLING OR PAIN  UNUSUAL VAGINAL DISCHARGE OR ITCHING   Items with * indicate a potential emergency and should be followed up as soon as possible or go to the Emergency Department if any problems should occur.  Please show the CHEMOTHERAPY ALERT CARD or IMMUNOTHERAPY ALERT CARD at check-in to the Emergency Department and triage nurse.  Should you have questions after your visit or need to cancel or reschedule your appointment, please contact [facility] [phone]  and follow the prompts.  Office hours are 8:00 a.m. to 4:30 p.m. Monday - Friday. Please note that voicemails left after 4:00 p.m. may not be returned until the following business day.  We are closed weekends and major holidays. You have  access to a nurse at all times for urgent questions. Please call the main number to the clinic [phone] and follow the prompts.  For any non-urgent questions, you may also contact your provider using MyChart. We now offer e-Visits for anyone 70 and older to request care online for non-urgent symptoms. For details visit [email or web address].   Also download the MyChart app! Go to the app store, search "MyChart", open the app, select [facility], and log in with your MyChart username and password.  Masks are optional in the cancer  centers. If you would like for your care team to wear a mask while they are taking care of you, please let them know. You may have one support person who is at least 65 years old accompany you for your appointments.

## 2022-02-02 ENCOUNTER — Inpatient Hospital Stay: Payer: PPO | Attending: Hematology

## 2022-02-02 VITALS — BP 124/83 | HR 82 | Temp 97.3°F | Resp 16

## 2022-02-02 DIAGNOSIS — M7989 Other specified soft tissue disorders: Secondary | ICD-10-CM | POA: Diagnosis not present

## 2022-02-02 DIAGNOSIS — Z803 Family history of malignant neoplasm of breast: Secondary | ICD-10-CM | POA: Diagnosis not present

## 2022-02-02 DIAGNOSIS — R609 Edema, unspecified: Secondary | ICD-10-CM | POA: Diagnosis not present

## 2022-02-02 DIAGNOSIS — Z452 Encounter for adjustment and management of vascular access device: Secondary | ICD-10-CM | POA: Diagnosis not present

## 2022-02-02 DIAGNOSIS — Z923 Personal history of irradiation: Secondary | ICD-10-CM | POA: Diagnosis not present

## 2022-02-02 DIAGNOSIS — Z87891 Personal history of nicotine dependence: Secondary | ICD-10-CM | POA: Diagnosis not present

## 2022-02-02 DIAGNOSIS — C9200 Acute myeloblastic leukemia, not having achieved remission: Secondary | ICD-10-CM | POA: Diagnosis present

## 2022-02-02 DIAGNOSIS — Z5111 Encounter for antineoplastic chemotherapy: Secondary | ICD-10-CM | POA: Diagnosis not present

## 2022-02-02 DIAGNOSIS — Z79899 Other long term (current) drug therapy: Secondary | ICD-10-CM | POA: Insufficient documentation

## 2022-02-02 DIAGNOSIS — R197 Diarrhea, unspecified: Secondary | ICD-10-CM | POA: Diagnosis not present

## 2022-02-02 DIAGNOSIS — Z7969 Long term (current) use of other immunomodulators and immunosuppressants: Secondary | ICD-10-CM | POA: Insufficient documentation

## 2022-02-02 DIAGNOSIS — Z853 Personal history of malignant neoplasm of breast: Secondary | ICD-10-CM | POA: Insufficient documentation

## 2022-02-02 MED ORDER — HEPARIN SOD (PORK) LOCK FLUSH 100 UNIT/ML IV SOLN
250.0000 [IU] | Freq: Once | INTRAVENOUS | Status: AC | PRN
  Administered 2022-02-02: 250 [IU]

## 2022-02-02 MED ORDER — PALONOSETRON HCL INJECTION 0.25 MG/5ML
0.2500 mg | Freq: Once | INTRAVENOUS | Status: AC
  Administered 2022-02-02: 0.25 mg via INTRAVENOUS
  Filled 2022-02-02: qty 5

## 2022-02-02 MED ORDER — SODIUM CHLORIDE 0.9 % IV SOLN: Freq: Once | INTRAVENOUS | Status: AC

## 2022-02-02 MED ORDER — SODIUM CHLORIDE 0.9% FLUSH
10.0000 mL | INTRAVENOUS | Status: DC | PRN
  Administered 2022-02-02 (×2): 10 mL

## 2022-02-02 MED ORDER — SODIUM CHLORIDE 0.9 % IV SOLN
20.0000 mg/m2 | Freq: Once | INTRAVENOUS | Status: AC
  Administered 2022-02-02: 45 mg via INTRAVENOUS
  Filled 2022-02-02: qty 9

## 2022-02-02 NOTE — Progress Notes (Signed)
Patient tolerated chemotherapy with no complaints voiced. Side effects with management reviewed understanding verbalized.PICC site clean and dry with no bruising or swelling noted at site. Good blood return noted before and after administration of chemotherapy. Patient left in satisfactory condition with VSS and no s/s of distress noted.

## 2022-02-02 NOTE — Patient Instructions (Signed)
[facility]  Discharge Instructions: Thank you for choosing [facility] to provide your oncology and hematology care.  If you have a lab appointment with the [facility], please come in thru the Main Entrance and check in at the main information desk.  Wear comfortable clothing and clothing appropriate for easy access to any Portacath or PICC line.   We strive to give you quality time with your provider. You may need to reschedule your appointment if you arrive late (15 or more minutes).  Arriving late affects you and other patients whose appointments are after yours.  Also, if you miss three or more appointments without notifying the office, you may be dismissed from the clinic at the provider's discretion.      For prescription refill requests, have your pharmacy contact our office and allow 72 hours for refills to be completed.    Today you received the following chemotherapy and/or immunotherapy agents Decitabine, return as scheduled.    To help prevent nausea and vomiting after your treatment, we encourage you to take your nausea medication as directed.  BELOW ARE SYMPTOMS THAT SHOULD BE REPORTED IMMEDIATELY: *FEVER GREATER THAN 100.4 F (38 C) OR HIGHER *CHILLS OR SWEATING *NAUSEA AND VOMITING THAT IS NOT CONTROLLED WITH YOUR NAUSEA MEDICATION *UNUSUAL SHORTNESS OF BREATH *UNUSUAL BRUISING OR BLEEDING *URINARY PROBLEMS (pain or burning when urinating, or frequent urination) *BOWEL PROBLEMS (unusual diarrhea, constipation, pain near the anus) TENDERNESS IN MOUTH AND THROAT WITH OR WITHOUT PRESENCE OF ULCERS (sore throat, sores in mouth, or a toothache) UNUSUAL RASH, SWELLING OR PAIN  UNUSUAL VAGINAL DISCHARGE OR ITCHING   Items with * indicate a potential emergency and should be followed up as soon as possible or go to the Emergency Department if any problems should occur.  Please show the CHEMOTHERAPY ALERT CARD or IMMUNOTHERAPY ALERT CARD at  check-in to the Emergency Department and triage nurse.  Should you have questions after your visit or need to cancel or reschedule your appointment, please contact [facility] [phone]  and follow the prompts.  Office hours are 8:00 a.m. to 4:30 p.m. Monday - Friday. Please note that voicemails left after 4:00 p.m. may not be returned until the following business day.  We are closed weekends and major holidays. You have access to a nurse at all times for urgent questions. Please call the main number to the clinic [phone] and follow the prompts.  For any non-urgent questions, you may also contact your provider using MyChart. We now offer e-Visits for anyone 18 and older to request care online for non-urgent symptoms. For details visit [email or web address].   Also download the MyChart app! Go to the app store, search "MyChart", open the app, select [facility], and log in with your MyChart username and password.  Masks are optional in the cancer centers. If you would like for your care team to wear a mask while they are taking care of you, please let them know. You may have one support person who is at least 65 years old accompany you for your appointments.

## 2022-02-05 ENCOUNTER — Inpatient Hospital Stay (HOSPITAL_BASED_OUTPATIENT_CLINIC_OR_DEPARTMENT_OTHER): Payer: PPO | Admitting: Hematology

## 2022-02-05 ENCOUNTER — Inpatient Hospital Stay: Payer: PPO

## 2022-02-05 DIAGNOSIS — Z5111 Encounter for antineoplastic chemotherapy: Secondary | ICD-10-CM | POA: Diagnosis not present

## 2022-02-05 DIAGNOSIS — D61818 Other pancytopenia: Secondary | ICD-10-CM

## 2022-02-05 DIAGNOSIS — C9200 Acute myeloblastic leukemia, not having achieved remission: Secondary | ICD-10-CM

## 2022-02-05 LAB — COMPREHENSIVE METABOLIC PANEL
ALT: 13 U/L (ref 0–44)
AST: 22 U/L (ref 15–41)
Albumin: 3.4 g/dL — ABNORMAL LOW (ref 3.5–5.0)
Alkaline Phosphatase: 86 U/L (ref 38–126)
Anion gap: 9 (ref 5–15)
BUN: 16 mg/dL (ref 8–23)
CO2: 23 mmol/L (ref 22–32)
Calcium: 8.6 mg/dL — ABNORMAL LOW (ref 8.9–10.3)
Chloride: 106 mmol/L (ref 98–111)
Creatinine, Ser: 1.01 mg/dL — ABNORMAL HIGH (ref 0.44–1.00)
GFR, Estimated: >60 mL/min (ref >60)
Glucose, Bld: 103 mg/dL — ABNORMAL HIGH (ref 70–99)
Potassium: 4.2 mmol/L (ref 3.5–5.1)
Sodium: 138 mmol/L (ref 135–145)
Total Bilirubin: 1.5 mg/dL — ABNORMAL HIGH (ref 0.3–1.2)
Total Protein: 6.7 g/dL (ref 6.5–8.1)

## 2022-02-05 LAB — CBC WITH DIFFERENTIAL/PLATELET
Abs Immature Granulocytes: 0.01 10*3/uL (ref 0.00–0.07)
Basophils Absolute: 0 10*3/uL (ref 0.0–0.1)
Basophils Relative: 0 %
Eosinophils Absolute: 0 10*3/uL (ref 0.0–0.5)
Eosinophils Relative: 0 %
HCT: 24.4 % — ABNORMAL LOW (ref 36.0–46.0)
Hemoglobin: 7.7 g/dL — ABNORMAL LOW (ref 12.0–15.0)
Immature Granulocytes: 1 %
Lymphocytes Relative: 26 %
Lymphs Abs: 0.3 10*3/uL — ABNORMAL LOW (ref 0.7–4.0)
MCH: 31.2 pg (ref 26.0–34.0)
MCHC: 31.6 g/dL (ref 30.0–36.0)
MCV: 98.8 fL (ref 80.0–100.0)
Monocytes Absolute: 0.1 10*3/uL (ref 0.1–1.0)
Monocytes Relative: 7 %
Neutro Abs: 0.7 10*3/uL — ABNORMAL LOW (ref 1.7–7.7)
Neutrophils Relative %: 66 %
Platelets: 49 10*3/uL — ABNORMAL LOW (ref 150–400)
RBC: 2.47 MIL/uL — ABNORMAL LOW (ref 3.87–5.11)
RDW: 23.2 % — ABNORMAL HIGH (ref 11.5–15.5)
WBC: 1.1 10*3/uL — CL (ref 4.0–10.5)
nRBC: 0 % (ref 0.0–0.2)

## 2022-02-05 LAB — PREPARE RBC (CROSSMATCH)

## 2022-02-05 LAB — SAMPLE TO BLOOD BANK

## 2022-02-05 LAB — MAGNESIUM: Magnesium: 1.5 mg/dL — ABNORMAL LOW (ref 1.7–2.4)

## 2022-02-05 MED ORDER — MAGNESIUM SULFATE 2 GM/50ML IV SOLN
2.0000 g | Freq: Once | INTRAVENOUS | Status: AC
  Administered 2022-02-06: 2 g via INTRAVENOUS
  Filled 2022-02-05: qty 50

## 2022-02-05 MED ORDER — HEPARIN SOD (PORK) LOCK FLUSH 100 UNIT/ML IV SOLN
500.0000 [IU] | Freq: Once | INTRAVENOUS | Status: AC
  Administered 2022-02-05: 500 [IU] via INTRAVENOUS

## 2022-02-05 MED ORDER — SODIUM CHLORIDE 0.9% FLUSH
10.0000 mL | Freq: Once | INTRAVENOUS | Status: AC
  Administered 2022-02-05: 10 mL via INTRAVENOUS

## 2022-02-05 NOTE — Addendum Note (Signed)
Addended by: Donnie Aho on: 02/05/2022 03:22 PM   Modules accepted: Orders

## 2022-02-05 NOTE — Progress Notes (Signed)
Kaitlyn Hull presented for PICC flush with labs and dressing change.  See IV assessment in docflowsheets for PICC details.  Proper placement of PICC confirmed by CXR.  PICC located left arm.  Good blood return present.  Both PICC lumens flushed with 47m NS and 250U Heparin, see MAR for further details.  Kaitlyn SKILTONtolerated procedure well and without incident.

## 2022-02-05 NOTE — Patient Instructions (Signed)
[facility]  Discharge Instructions: Thank you for choosing [facility] to provide your oncology and hematology care.  If you have a lab appointment with the [facility], please come in thru the Main Entrance and check in at the main information desk.  Wear comfortable clothing and clothing appropriate for easy access to any Portacath or PICC line.   We strive to give you quality time with your provider. You may need to reschedule your appointment if you arrive late (15 or more minutes).  Arriving late affects you and other patients whose appointments are after yours.  Also, if you miss three or more appointments without notifying the office, you may be dismissed from the clinic at the provider's discretion.      For prescription refill requests, have your pharmacy contact our office and allow 72 hours for refills to be completed.    Today you received the following chemotherapy and/or immunotherapy agents PICC flush labs and dressing change      To help prevent nausea and vomiting after your treatment, we encourage you to take your nausea medication as directed.  BELOW ARE SYMPTOMS THAT SHOULD BE REPORTED IMMEDIATELY: *FEVER GREATER THAN 100.4 F (38 C) OR HIGHER *CHILLS OR SWEATING *NAUSEA AND VOMITING THAT IS NOT CONTROLLED WITH YOUR NAUSEA MEDICATION *UNUSUAL SHORTNESS OF BREATH *UNUSUAL BRUISING OR BLEEDING *URINARY PROBLEMS (pain or burning when urinating, or frequent urination) *BOWEL PROBLEMS (unusual diarrhea, constipation, pain near the anus) TENDERNESS IN MOUTH AND THROAT WITH OR WITHOUT PRESENCE OF ULCERS (sore throat, sores in mouth, or a toothache) UNUSUAL RASH, SWELLING OR PAIN  UNUSUAL VAGINAL DISCHARGE OR ITCHING   Items with * indicate a potential emergency and should be followed up as soon as possible or go to the Emergency Department if any problems should occur.  Please show the CHEMOTHERAPY ALERT CARD or IMMUNOTHERAPY ALERT CARD  at check-in to the Emergency Department and triage nurse.  Should you have questions after your visit or need to cancel or reschedule your appointment, please contact [facility] [phone]  and follow the prompts.  Office hours are 8:00 a.m. to 4:30 p.m. Monday - Friday. Please note that voicemails left after 4:00 p.m. may not be returned until the following business day.  We are closed weekends and major holidays. You have access to a nurse at all times for urgent questions. Please call the main number to the clinic [phone] and follow the prompts.  For any non-urgent questions, you may also contact your provider using MyChart. We now offer e-Visits for anyone 54 and older to request care online for non-urgent symptoms. For details visit [email or web address].   Also download the MyChart app! Go to the app store, search "MyChart", open the app, select [facility], and log in with your MyChart username and password.  Masks are optional in the cancer centers. If you would like for your care team to wear a mask while they are taking care of you, please let them know. You may have one support person who is at least 65 years old accompany you for your appointments.

## 2022-02-05 NOTE — Patient Instructions (Addendum)
[facility]  Discharge Instructions  You were seen and examined today by Dr. Delton Coombes.  Dr. Delton Coombes discussed your most recent lab work which revealed that your magnesium is low and your hemoglobin is low.  You will get magnesium and blood tomorrow.  Follow-up as scheduled.    Thank you for choosing [facility] to provide your oncology and hematology care.   To afford each patient quality time with our provider, please arrive at least 15 minutes before your scheduled appointment time. You may need to reschedule your appointment if you arrive late (10 or more minutes). Arriving late affects you and other patients whose appointments are after yours.  Also, if you miss three or more appointments without notifying the office, you may be dismissed from the clinic at the provider's discretion.    Again, thank you for choosing [facility].  Our hope is that these requests will decrease the amount of time that you wait before being seen by our physicians.   If you have a lab appointment with the [facility] please come in thru the Main Entrance and check in at the main information desk.           _____________________________________________________________  Should you have questions after your visit to [facility], please contact our office at [phone] and follow the prompts.  Our office hours are 8:00 a.m. to 4:30 p.m. Monday - Thursday and 8:00 a.m. to 2:30 p.m. Friday.  Please note that voicemails left after 4:00 p.m. may not be returned until the following business day.  We are closed weekends and all major holidays.  You do have access to a nurse 24-7, just call the main number to the clinic [phone] and do not press any options, hold on the line and a nurse will answer the phone.    For prescription refill requests, have your pharmacy contact our office and allow 72 hours.    Masks are  optional in the cancer centers. If you would like for your care team to wear a mask while they are taking care of you, please let them know. You may have one support person who is at least 65 years old accompany you for your appointments.

## 2022-02-05 NOTE — Progress Notes (Signed)
Kaitlyn Hull, [place] [address]   CLINIC:  Medical Oncology/Hematology  PCP:  Stana Bunting, MD [facility] [id number] [phone]   REASON FOR VISIT:  Follow-up for acute myeloid leukemia.  PRIOR THERAPY: None  NGS Results: Positive for RUNX1, U2AF1, BCOR, ZRSR2, NRAS, KRAS, and DNMT3A  CURRENT THERAPY: Decitabine and venetoclax  BRIEF ONCOLOGIC HISTORY:  Oncology History  Acute myeloid leukemia not having achieved remission ([place])  10/30/2021 Initial Diagnosis   Acute myeloid leukemia not having achieved remission ([place])   11/14/2021 -  Chemotherapy   Patient is on Treatment Plan : AML Decitabine D1-5 q28d       CANCER STAGING:  Cancer Staging  No matching staging information was found for the patient.   INTERVAL HISTORY:  Kaitlyn Hull 66 y.o. female seen for follow-up of acute myeloid leukemia.  She reports energy levels of 25%.  Intermittent diarrhea is stable.  No fevers reported.  Cycle 3-day 1 of treatment was 01/29/2022.  She is taking magnesium once daily and has been off of allopurinol.  She has not been taking Lasix in the last 1 month and has been off of diltiazem.   REVIEW OF SYSTEMS:  Review of Systems  Gastrointestinal:  Positive for diarrhea.  Psychiatric/Behavioral:  Positive for sleep disturbance.   All other systems reviewed and are negative.    PAST MEDICAL/SURGICAL HISTORY:  Past Medical History:  Diagnosis Date   Atrial fibrillation with RVR ([place]) 06/2013   Breast cancer ([place]) 2007   left   Breast disorder    cancer   Diabetes mellitus    Type II   Dysrhythmia    Hematuria 01/20/2014   Hypertension    Hyperthyroidism 06/2013   Obesity    PMB (postmenopausal bleeding) 07/24/2012   Had 16.1 mm endometrium on [place] will get endo biopsy   Psoriasis    Urinary frequency 01/20/2014   Past Surgical History:  Procedure Laterality Date   BREAST BIOPSY Left 09/14/2020   Procedure: BREAST  BIOPSY;  Surgeon: Aviva Signs, MD;  Location: [facility];  Service: General;  Laterality: Left;   BREAST SURGERY  02/20/06   left side    CESAREAN SECTION  1995   COLONOSCOPY WITH PROPOFOL N/A 10/28/2021   Procedure: COLONOSCOPY WITH PROPOFOL;  Surgeon: Daneil Dolin, MD;  Location: [facility];  Service: Endoscopy;  Laterality: N/A;   ESOPHAGOGASTRODUODENOSCOPY (EGD) WITH PROPOFOL N/A 10/28/2021   Procedure: ESOPHAGOGASTRODUODENOSCOPY (EGD) WITH PROPOFOL;  Surgeon: Daneil Dolin, MD;  Location: [facility];  Service: Endoscopy;  Laterality: N/A;   HYSTEROSCOPY WITH D & C N/A 08/13/2012   Procedure: DILATATION AND CURETTAGE /HYSTEROSCOPY;  Surgeon: Florian Buff, MD;  Location: [facility];  Service: Gynecology;  Laterality: N/A;   SECONDARY CLOSURE OF WOUND Left 02/20/2021   Procedure: SIMPLE WOUND CLOSURE;  Surgeon: Aviva Signs, MD;  Location: [facility];  Service: General;  Laterality: Left;     SOCIAL HISTORY:  Social History   Socioeconomic History   Marital status: Widowed    Spouse name: Not on file   Number of children: Not on file   Years of education: Not on file   Highest education level: Not on file  Occupational History   Not on file  Tobacco Use   Smoking status: Former    Packs/day: 0.01    Years: 1.00    Total pack years: 0.01    Types: Cigarettes    Start  date: 03/05/1976    Quit date: 03/06/2003    Years since quitting: 18.9   Smokeless tobacco: Never  Vaping Use   Vaping Use: Never used  Substance and Sexual Activity   Alcohol use: No    Alcohol/week: 0.0 standard drinks of alcohol   Drug use: No   Sexual activity: Not Currently    Birth control/protection: Post-menopausal  Other Topics Concern   Not on file  Social History Narrative   Not on file   Social Determinants of Health   Financial Resource Strain: Not on file  Food Insecurity: No Food Insecurity (12/10/2021)   Hunger Vital Sign    Worried About Running Out of Food in the Last Year: Never true     Ran Out of Food in the Last Year: Never true  Transportation Needs: No Transportation Needs (12/10/2021)   PRAPARE - Hydrologist (Medical): No    Lack of Transportation (Non-Medical): No  Physical Activity: Not on file  Stress: Not on file  Social Connections: Not on file  Intimate Partner Violence: Not At Risk (12/10/2021)   Humiliation, Afraid, Rape, and Kick questionnaire    Fear of Current or Ex-Partner: No    Emotionally Abused: No    Physically Abused: No    Sexually Abused: No    FAMILY HISTORY:  Family History  Problem Relation Age of Onset   Heart failure Father    CAD Father    Diabetes Sister    Other Sister        blood clots   Breast cancer Sister    Alzheimer's disease Mother    Diabetes Sister    Hypertension Maternal Grandmother    Colon cancer Neg Hx     CURRENT MEDICATIONS:  Outpatient Encounter Medications as of 02/05/2022  Medication Sig   acetaminophen (TYLENOL) 500 MG tablet Take 500 mg by mouth every 6 (six) hours as needed. Taken as needed for headache   acyclovir (ZOVIRAX) 400 MG tablet Take 400 mg by mouth 2 (two) times daily.   allopurinol (ZYLOPRIM) 300 MG tablet Take 1 tablet (300 mg total) by mouth daily.   ALPRAZolam (XANAX) 0.5 MG tablet Take 1 tablet by mouth 2 (two) times daily as needed.   apixaban (ELIQUIS) 5 MG TABS tablet Take 5 mg by mouth 2 (two) times daily.   furosemide (LASIX) 20 MG tablet Take 1 tablet (20 mg total) by mouth daily as needed.   levofloxacin (LEVAQUIN) 500 MG tablet Take 1 tablet (500 mg total) by mouth daily as instructed by your oncologist (when neutropenic).   LORazepam (ATIVAN) 0.5 MG tablet Take 1 tablet (0.5 mg total) by mouth 2 (two) times daily as needed for anxiety.   magnesium oxide (MAG-OX) 400 (240 Mg) MG tablet Take 1 tablet by mouth 2 (two) times daily.   metFORMIN (GLUCOPHAGE-XR) 500 MG 24 hr tablet Take 500-1,000 mg by mouth See admin instructions. Take 500 mg by mouth  in the morning and 1000 mg at bedtime   metoprolol tartrate (LOPRESSOR) 100 MG tablet Take 1 tablet (100 mg total) by mouth 2 (two) times daily.   posaconazole (NOXAFIL) 100 MG TBEC delayed-release tablet Take 3 tablets (300 mg total) by mouth daily.   pravastatin (PRAVACHOL) 40 MG tablet Take 40 mg by mouth at bedtime.   prochlorperazine (COMPAZINE) 10 MG tablet Take 10 mg by mouth as needed.   Specialty Vitamins Products (MG PLUS PROTEIN) 133 MG TABS Take 1 tablet by mouth  3 (three) times daily.   venetoclax (VENCLEXTA) 100 MG tablet Take by mouth.   Facility-Administered Encounter Medications as of 02/05/2022  Medication   0.9 %  sodium chloride infusion (Manually program via Guardrails IV Fluids)   sodium chloride flush (NS) 0.9 % injection 10 mL    ALLERGIES:  No Known Allergies   PHYSICAL EXAM:  ECOG Performance status: 1  There were no vitals filed for this visit.  There were no vitals filed for this visit.  Physical Exam Vitals reviewed.  Constitutional:      Appearance: Normal appearance.  HENT:     Mouth/Throat:     Pharynx: No oropharyngeal exudate.  Cardiovascular:     Rate and Rhythm: Normal rate and regular rhythm.     Heart sounds: Normal heart sounds.  Pulmonary:     Breath sounds: Normal breath sounds.  Abdominal:     Palpations: Abdomen is soft. There is no mass.  Neurological:     General: No focal deficit present.     Mental Status: She is alert and oriented to person, place, and time.  Psychiatric:        Mood and Affect: Mood normal.        Behavior: Behavior normal.     LABORATORY DATA:  I have reviewed the labs as listed.  CBC    Component Value Date/Time   WBC 1.5 (L) 01/29/2022 1209   RBC 2.97 (L) 01/29/2022 1209   HGB 9.2 (L) 01/29/2022 1209   HCT 29.1 (L) 01/29/2022 1209   PLT 116 (L) 01/29/2022 1209   MCV 98.0 01/29/2022 1209   MCH 31.0 01/29/2022 1209   MCHC 31.6 01/29/2022 1209   RDW 24.0 (H) 01/29/2022 1209   LYMPHSABS 0.4  (L) 01/29/2022 1209   MONOABS 0.3 01/29/2022 1209   EOSABS 0.0 01/29/2022 1209   BASOSABS 0.0 01/29/2022 1209      Latest Ref Rng & Units 01/29/2022   12:09 PM 01/22/2022   11:18 AM 01/15/2022    8:21 AM  CMP  Glucose 70 - 99 mg/dL 115  110  121   BUN 8 - 23 mg/dL _0 Creatinine 0.44 - 1.00 mg/dL 0.98  1.27  1.27   Sodium 135 - 145 mmol/L 141  139  138   Potassium 3.5 - 5.1 mmol/L 3.9  3.7  3.8   Chloride 98 - 111 mmol/L 109  110  108   CO2 22 - 32 mmol/L _1 Calcium 8.9 - 10.3 mg/dL 8.7  8.8  8.9   Total Protein 6.5 - 8.1 g/dL 6.5  7.1  7.0   Total Bilirubin 0.3 - 1.2 mg/dL 0.9  1.2  1.4   Alkaline Phos 38 - 126 U/L 87  76  86   AST 15 - 41 U/L _2 ALT 0 - 44 U/L _3 DIAGNOSTIC IMAGING:  I have independently reviewed the scans and discussed with the patient.  ASSESSMENT:  1.  Acute myeloid leukemia (Dx 11/03/2021): - Presentation to the hospital on 10/26/2021 with hemoglobin 4.7 and platelet count 71. - BMBX (11/03/2021): Hypercellular (80%) with increased blasts (20-25% by CD34).  Background hematopoiesis demonstrates trilineage dysplasia. - Chromosomes: 78, XX, +14[12]/48, XX, +8, +14[8].  Trisomy 14 commonly associated with MDS and t-MDS.  Trisomy 8 associated with AML, MDS and MPD. - FISH: Trisomy 8, - NGS: Positive for  RUNX1, U2AF1, BCOR, ZRSR2, NRAS, KRAS, and DNMT3A. - Cycle 1 of decitabine (20 mg/m2) x5 days and venetoclax 100 mg daily (while taking posaconazole) or 200 mg daily (while not taking posaconazole but on diltiazem) on 11/14/2021. - BMBX (01/09/2022): Negative for morphological disease.  2.  Social/family history: - She lives at home with her boyfriend.  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  No major chemical exposure.  Quit smoking 28 years ago.  Sister has breast cancer.  3.  Stage I left breast cancer: - Diagnosed 10 years ago, treated with lumpectomy followed by 6 weeks of radiation.  No  chemotherapy.  Took antiestrogen therapy for 5 years.   PLAN:  1.  Acute myeloid leukemia: - C3 day 1 on 01/29/2022. - CBC today shows white count 1.1, hemoglobin 7.7 and platelet count 49.  ANC 0.7.  Continue prophylactic antibiotics. - We will arrange for 2 units of PRBC. - RTC 3 weeks for follow-up.  2.  Hypomagnesemia: - She is taking magnesium once daily.  Magnesium today is 1.5.  She will receive IV magnesium 2 g.  Will increase magnesium to twice daily.  3.  Diarrhea: - Continue Imodium as needed.  4.  Leg swellings: - She has not required Lasix in the last 1 month.      Orders placed this encounter:  No orders of the defined types were placed in this encounter.     Derek Jack, MD [place] [phone]

## 2022-02-05 NOTE — Progress Notes (Signed)
Dr. Delton Coombes is increasing oral magnesium to twice daily and ordering magnesium 2 grams IV and 2 units of packed RBC to be given tomorrow.

## 2022-02-05 NOTE — Progress Notes (Signed)
CRITICAL VALUE ALERT Critical value received:  WBC 1.11 Date of notification:  02-05-22 Time of notification: 1442 Critical value read back:  Yes.   Nurse who received alert:  C. Jya Hughston RN MD notified time and response:  8677, no change per Dr. Delton Coombes

## 2022-02-06 ENCOUNTER — Inpatient Hospital Stay: Payer: PPO

## 2022-02-06 DIAGNOSIS — C9200 Acute myeloblastic leukemia, not having achieved remission: Secondary | ICD-10-CM

## 2022-02-06 DIAGNOSIS — Z5111 Encounter for antineoplastic chemotherapy: Secondary | ICD-10-CM | POA: Diagnosis not present

## 2022-02-06 MED ORDER — HEPARIN SOD (PORK) LOCK FLUSH 100 UNIT/ML IV SOLN
500.0000 [IU] | Freq: Once | INTRAVENOUS | Status: AC
  Administered 2022-02-06: 500 [IU] via INTRAVENOUS

## 2022-02-06 MED ORDER — SODIUM CHLORIDE 0.9 % IV SOLN: INTRAVENOUS | Status: DC

## 2022-02-06 MED ORDER — DIPHENHYDRAMINE HCL 25 MG PO CAPS
25.0000 mg | ORAL_CAPSULE | Freq: Once | ORAL | Status: AC
  Administered 2022-02-06: 25 mg via ORAL
  Filled 2022-02-06: qty 1

## 2022-02-06 MED ORDER — HEPARIN SOD (PORK) LOCK FLUSH 100 UNIT/ML IV SOLN: 250.0000 [IU] | INTRAVENOUS | Status: DC | PRN

## 2022-02-06 MED ORDER — SODIUM CHLORIDE 0.9% FLUSH
10.0000 mL | Freq: Once | INTRAVENOUS | Status: AC
  Administered 2022-02-06: 10 mL via INTRAVENOUS

## 2022-02-06 MED ORDER — SODIUM CHLORIDE 0.9% FLUSH: 10.0000 mL | INTRAVENOUS | Status: DC | PRN

## 2022-02-06 MED ORDER — ACETAMINOPHEN 325 MG PO TABS
650.0000 mg | ORAL_TABLET | Freq: Once | ORAL | Status: AC
  Administered 2022-02-06: 650 mg via ORAL
  Filled 2022-02-06: qty 2

## 2022-02-06 MED ORDER — SODIUM CHLORIDE 0.9% IV SOLUTION
250.0000 mL | Freq: Once | INTRAVENOUS | Status: AC
  Administered 2022-02-06: 250 mL via INTRAVENOUS

## 2022-02-06 NOTE — Progress Notes (Signed)
Patient presents today for 2 UPRBC and 2 grams Magnesium IV per providers order.  Vital signs WNL.  Patient has no new complaints at this time.  Tolerated transfusion without adverse affects.  Vital signs stable.  No complaints at this time.  Discharge from clinic ambulatory in stable condition.  Alert and oriented X 3.  Follow up with [facility] as scheduled.

## 2022-02-06 NOTE — Patient Instructions (Signed)
[facility]  Discharge Instructions: Thank you for choosing [facility] to provide your oncology and hematology care.  If you have a lab appointment with the Cancer Center, please come in thru the Main Entrance and check in at the main information desk.  Wear comfortable clothing and clothing appropriate for easy access to any Portacath or PICC line.   We strive to give you quality time with your provider. You may need to reschedule your appointment if you arrive late (15 or more minutes).  Arriving late affects you and other patients whose appointments are after yours.  Also, if you miss three or more appointments without notifying the office, you may be dismissed from the clinic at the provider's discretion.      For prescription refill requests, have your pharmacy contact our office and allow 72 hours for refills to be completed.    Today you received the following chemotherapy and/or immunotherapy agents blood transfusion      To help prevent nausea and vomiting after your treatment, we encourage you to take your nausea medication as directed.  BELOW ARE SYMPTOMS THAT SHOULD BE REPORTED IMMEDIATELY: *FEVER GREATER THAN 100.4 F (38 C) OR HIGHER *CHILLS OR SWEATING *NAUSEA AND VOMITING THAT IS NOT CONTROLLED WITH YOUR NAUSEA MEDICATION *UNUSUAL SHORTNESS OF BREATH *UNUSUAL BRUISING OR BLEEDING *URINARY PROBLEMS (pain or burning when urinating, or frequent urination) *BOWEL PROBLEMS (unusual diarrhea, constipation, pain near the anus) TENDERNESS IN MOUTH AND THROAT WITH OR WITHOUT PRESENCE OF ULCERS (sore throat, sores in mouth, or a toothache) UNUSUAL RASH, SWELLING OR PAIN  UNUSUAL VAGINAL DISCHARGE OR ITCHING   Items with * indicate a potential emergency and should be followed up as soon as possible or go to the Emergency Department if any problems should occur.  Please show the CHEMOTHERAPY ALERT CARD or IMMUNOTHERAPY ALERT CARD at check-in to the  Emergency Department and triage nurse.  Should you have questions after your visit or need to cancel or reschedule your appointment, please contact [facility] [phone]  and follow the prompts.  Office hours are 8:00 a.m. to 4:30 p.m. Monday - Friday. Please note that voicemails left after 4:00 p.m. may not be returned until the following business day.  We are closed weekends and major holidays. You have access to a nurse at all times for urgent questions. Please call the main number to the clinic [phone] and follow the prompts.  For any non-urgent questions, you may also contact your provider using MyChart. We now offer e-Visits for anyone 18 and older to request care online for non-urgent symptoms. For details visit [email or web address].   Also download the MyChart app! Go to the app store, search "MyChart", open the app, select [facility], and log in with your MyChart username and password.  Masks are optional in the cancer centers. If you would like for your care team to wear a mask while they are taking care of you, please let them know. You may have one support person who is at least 65 years old accompany you for your appointments.  

## 2022-02-06 NOTE — Progress Notes (Signed)
Chaplain engaged in a follow-up visit with Cherilynn and her partner.  Chaplain was able to learn more about Zyana, her healthcare journey, daily life, and the hope for the future.  Chaplain offered community and listening.    Kaitlyn Hull is looking forward to her bone marrow transplant at the end of January and being able to be around the people she loves, and do the things she enjoys.     02/06/22 1100  Clinical Encounter Type  Visited With Patient  Visit Type Follow-up;Spiritual support

## 2022-02-07 LAB — TYPE AND SCREEN
ABO/RH(D): A POS
Antibody Screen: NEGATIVE
Unit division: 0
Unit division: 0

## 2022-02-07 LAB — BPAM RBC
Blood Product Expiration Date: 202312252359
Blood Product Expiration Date: 202312252359
ISSUE DATE / TIME: 202312051035
ISSUE DATE / TIME: 202312051210
Unit Type and Rh: 6200
Unit Type and Rh: 6200

## 2022-02-12 ENCOUNTER — Inpatient Hospital Stay: Payer: PPO

## 2022-02-13 ENCOUNTER — Inpatient Hospital Stay: Payer: PPO

## 2022-02-13 ENCOUNTER — Inpatient Hospital Stay: Payer: PPO | Admitting: Hematology

## 2022-02-14 ENCOUNTER — Inpatient Hospital Stay: Payer: PPO

## 2022-02-15 ENCOUNTER — Inpatient Hospital Stay: Payer: PPO

## 2022-02-16 ENCOUNTER — Inpatient Hospital Stay: Payer: PPO

## 2022-02-17 ENCOUNTER — Emergency Department (HOSPITAL_COMMUNITY): Payer: PPO

## 2022-02-17 ENCOUNTER — Encounter (HOSPITAL_COMMUNITY): Payer: Self-pay

## 2022-02-17 ENCOUNTER — Other Ambulatory Visit: Payer: Self-pay

## 2022-02-17 ENCOUNTER — Encounter (HOSPITAL_COMMUNITY): Payer: Self-pay | Admitting: *Deleted

## 2022-02-17 ENCOUNTER — Inpatient Hospital Stay (HOSPITAL_COMMUNITY)
Admission: EM | Admit: 2022-02-17 | Discharge: 2022-03-05 | DRG: 871 | Disposition: E | Payer: PPO | Attending: Critical Care Medicine | Admitting: Critical Care Medicine

## 2022-02-17 DIAGNOSIS — D61818 Other pancytopenia: Secondary | ICD-10-CM | POA: Diagnosis present

## 2022-02-17 DIAGNOSIS — Z803 Family history of malignant neoplasm of breast: Secondary | ICD-10-CM

## 2022-02-17 DIAGNOSIS — Z9911 Dependence on respirator [ventilator] status: Secondary | ICD-10-CM

## 2022-02-17 DIAGNOSIS — R7881 Bacteremia: Secondary | ICD-10-CM | POA: Insufficient documentation

## 2022-02-17 DIAGNOSIS — Z66 Do not resuscitate: Secondary | ICD-10-CM | POA: Diagnosis present

## 2022-02-17 DIAGNOSIS — E876 Hypokalemia: Secondary | ICD-10-CM | POA: Diagnosis not present

## 2022-02-17 DIAGNOSIS — Z1152 Encounter for screening for COVID-19: Secondary | ICD-10-CM | POA: Diagnosis not present

## 2022-02-17 DIAGNOSIS — Y848 Other medical procedures as the cause of abnormal reaction of the patient, or of later complication, without mention of misadventure at the time of the procedure: Secondary | ICD-10-CM | POA: Diagnosis present

## 2022-02-17 DIAGNOSIS — E869 Volume depletion, unspecified: Secondary | ICD-10-CM | POA: Diagnosis present

## 2022-02-17 DIAGNOSIS — I82612 Acute embolism and thrombosis of superficial veins of left upper extremity: Secondary | ICD-10-CM | POA: Diagnosis not present

## 2022-02-17 DIAGNOSIS — I130 Hypertensive heart and chronic kidney disease with heart failure and stage 1 through stage 4 chronic kidney disease, or unspecified chronic kidney disease: Secondary | ICD-10-CM | POA: Diagnosis present

## 2022-02-17 DIAGNOSIS — L03211 Cellulitis of face: Secondary | ICD-10-CM | POA: Diagnosis present

## 2022-02-17 DIAGNOSIS — T82868A Thrombosis of vascular prosthetic devices, implants and grafts, initial encounter: Secondary | ICD-10-CM | POA: Diagnosis not present

## 2022-02-17 DIAGNOSIS — R918 Other nonspecific abnormal finding of lung field: Secondary | ICD-10-CM

## 2022-02-17 DIAGNOSIS — D696 Thrombocytopenia, unspecified: Secondary | ICD-10-CM | POA: Diagnosis present

## 2022-02-17 DIAGNOSIS — D701 Agranulocytosis secondary to cancer chemotherapy: Secondary | ICD-10-CM | POA: Diagnosis present

## 2022-02-17 DIAGNOSIS — R6521 Severe sepsis with septic shock: Secondary | ICD-10-CM | POA: Diagnosis present

## 2022-02-17 DIAGNOSIS — J9601 Acute respiratory failure with hypoxia: Secondary | ICD-10-CM | POA: Diagnosis present

## 2022-02-17 DIAGNOSIS — I080 Rheumatic disorders of both mitral and aortic valves: Secondary | ICD-10-CM | POA: Diagnosis present

## 2022-02-17 DIAGNOSIS — E1122 Type 2 diabetes mellitus with diabetic chronic kidney disease: Secondary | ICD-10-CM | POA: Diagnosis present

## 2022-02-17 DIAGNOSIS — I4821 Permanent atrial fibrillation: Secondary | ICD-10-CM | POA: Diagnosis present

## 2022-02-17 DIAGNOSIS — Z6841 Body Mass Index (BMI) 40.0 and over, adult: Secondary | ICD-10-CM

## 2022-02-17 DIAGNOSIS — A4101 Sepsis due to Methicillin susceptible Staphylococcus aureus: Principal | ICD-10-CM | POA: Diagnosis present

## 2022-02-17 DIAGNOSIS — Z515 Encounter for palliative care: Secondary | ICD-10-CM | POA: Diagnosis not present

## 2022-02-17 DIAGNOSIS — I5033 Acute on chronic diastolic (congestive) heart failure: Secondary | ICD-10-CM | POA: Diagnosis present

## 2022-02-17 DIAGNOSIS — T451X5A Adverse effect of antineoplastic and immunosuppressive drugs, initial encounter: Secondary | ICD-10-CM | POA: Diagnosis not present

## 2022-02-17 DIAGNOSIS — E0590 Thyrotoxicosis, unspecified without thyrotoxic crisis or storm: Secondary | ICD-10-CM | POA: Diagnosis present

## 2022-02-17 DIAGNOSIS — R5081 Fever presenting with conditions classified elsewhere: Secondary | ICD-10-CM | POA: Diagnosis not present

## 2022-02-17 DIAGNOSIS — C9200 Acute myeloblastic leukemia, not having achieved remission: Secondary | ICD-10-CM

## 2022-02-17 DIAGNOSIS — E875 Hyperkalemia: Secondary | ICD-10-CM | POA: Diagnosis not present

## 2022-02-17 DIAGNOSIS — A419 Sepsis, unspecified organism: Secondary | ICD-10-CM

## 2022-02-17 DIAGNOSIS — D709 Neutropenia, unspecified: Secondary | ICD-10-CM | POA: Diagnosis not present

## 2022-02-17 DIAGNOSIS — Z833 Family history of diabetes mellitus: Secondary | ICD-10-CM

## 2022-02-17 DIAGNOSIS — Z87891 Personal history of nicotine dependence: Secondary | ICD-10-CM | POA: Diagnosis not present

## 2022-02-17 DIAGNOSIS — I4891 Unspecified atrial fibrillation: Secondary | ICD-10-CM | POA: Diagnosis not present

## 2022-02-17 DIAGNOSIS — Z7901 Long term (current) use of anticoagulants: Secondary | ICD-10-CM

## 2022-02-17 DIAGNOSIS — Z91148 Patient's other noncompliance with medication regimen for other reason: Secondary | ICD-10-CM

## 2022-02-17 DIAGNOSIS — K1120 Sialoadenitis, unspecified: Secondary | ICD-10-CM

## 2022-02-17 DIAGNOSIS — M545 Low back pain, unspecified: Secondary | ICD-10-CM | POA: Diagnosis not present

## 2022-02-17 DIAGNOSIS — N179 Acute kidney failure, unspecified: Secondary | ICD-10-CM | POA: Diagnosis present

## 2022-02-17 DIAGNOSIS — D6869 Other thrombophilia: Secondary | ICD-10-CM | POA: Diagnosis not present

## 2022-02-17 DIAGNOSIS — L03221 Cellulitis of neck: Secondary | ICD-10-CM | POA: Diagnosis present

## 2022-02-17 DIAGNOSIS — R799 Abnormal finding of blood chemistry, unspecified: Secondary | ICD-10-CM | POA: Diagnosis present

## 2022-02-17 DIAGNOSIS — Z820 Family history of epilepsy and other diseases of the nervous system: Secondary | ICD-10-CM

## 2022-02-17 DIAGNOSIS — E872 Acidosis, unspecified: Secondary | ICD-10-CM | POA: Diagnosis present

## 2022-02-17 DIAGNOSIS — I5031 Acute diastolic (congestive) heart failure: Secondary | ICD-10-CM

## 2022-02-17 DIAGNOSIS — J189 Pneumonia, unspecified organism: Secondary | ICD-10-CM | POA: Diagnosis present

## 2022-02-17 DIAGNOSIS — Z853 Personal history of malignant neoplasm of breast: Secondary | ICD-10-CM

## 2022-02-17 DIAGNOSIS — N1831 Chronic kidney disease, stage 3a: Secondary | ICD-10-CM | POA: Diagnosis present

## 2022-02-17 DIAGNOSIS — Z79899 Other long term (current) drug therapy: Secondary | ICD-10-CM

## 2022-02-17 DIAGNOSIS — B9561 Methicillin susceptible Staphylococcus aureus infection as the cause of diseases classified elsewhere: Secondary | ICD-10-CM | POA: Insufficient documentation

## 2022-02-17 DIAGNOSIS — Z7984 Long term (current) use of oral hypoglycemic drugs: Secondary | ICD-10-CM

## 2022-02-17 DIAGNOSIS — I482 Chronic atrial fibrillation, unspecified: Secondary | ICD-10-CM | POA: Diagnosis not present

## 2022-02-17 DIAGNOSIS — Z8249 Family history of ischemic heart disease and other diseases of the circulatory system: Secondary | ICD-10-CM

## 2022-02-17 DIAGNOSIS — E1165 Type 2 diabetes mellitus with hyperglycemia: Secondary | ICD-10-CM | POA: Diagnosis present

## 2022-02-17 DIAGNOSIS — L03114 Cellulitis of left upper limb: Secondary | ICD-10-CM | POA: Diagnosis not present

## 2022-02-17 DIAGNOSIS — Z923 Personal history of irradiation: Secondary | ICD-10-CM

## 2022-02-17 HISTORY — DX: Leukemia, unspecified not having achieved remission: C95.90

## 2022-02-17 LAB — COMPREHENSIVE METABOLIC PANEL
ALT: 19 U/L (ref 0–44)
AST: 34 U/L (ref 15–41)
Albumin: 3.3 g/dL — ABNORMAL LOW (ref 3.5–5.0)
Alkaline Phosphatase: 68 U/L (ref 38–126)
Anion gap: 11 (ref 5–15)
BUN: 27 mg/dL — ABNORMAL HIGH (ref 8–23)
CO2: 21 mmol/L — ABNORMAL LOW (ref 22–32)
Calcium: 8.9 mg/dL (ref 8.9–10.3)
Chloride: 98 mmol/L (ref 98–111)
Creatinine, Ser: 1.61 mg/dL — ABNORMAL HIGH (ref 0.44–1.00)
GFR, Estimated: 35 mL/min — ABNORMAL LOW (ref >60)
Glucose, Bld: 223 mg/dL — ABNORMAL HIGH (ref 70–99)
Potassium: 3.8 mmol/L (ref 3.5–5.1)
Sodium: 130 mmol/L — ABNORMAL LOW (ref 135–145)
Total Bilirubin: 3.1 mg/dL — ABNORMAL HIGH (ref 0.3–1.2)
Total Protein: 7.3 g/dL (ref 6.5–8.1)

## 2022-02-17 LAB — URINALYSIS, COMPLETE (UACMP) WITH MICROSCOPIC
Bilirubin Urine: NEGATIVE
Glucose, UA: NEGATIVE mg/dL
Ketones, ur: NEGATIVE mg/dL
Leukocytes,Ua: NEGATIVE
Nitrite: POSITIVE — AB
Protein, ur: 30 mg/dL — AB
Specific Gravity, Urine: 1.019 (ref 1.005–1.030)
pH: 5 (ref 5.0–8.0)

## 2022-02-17 LAB — CBC WITH DIFFERENTIAL/PLATELET
Abs Immature Granulocytes: 0.01 10*3/uL (ref 0.00–0.07)
Basophils Absolute: 0 10*3/uL (ref 0.0–0.1)
Basophils Relative: 0 %
Eosinophils Absolute: 0 10*3/uL (ref 0.0–0.5)
Eosinophils Relative: 0 %
HCT: 23 % — ABNORMAL LOW (ref 36.0–46.0)
Hemoglobin: 7.9 g/dL — ABNORMAL LOW (ref 12.0–15.0)
Immature Granulocytes: 9 %
Lymphocytes Relative: 83 %
Lymphs Abs: 0.1 10*3/uL — ABNORMAL LOW (ref 0.7–4.0)
MCH: 30.5 pg (ref 26.0–34.0)
MCHC: 34.3 g/dL (ref 30.0–36.0)
MCV: 88.8 fL (ref 80.0–100.0)
Monocytes Absolute: 0 10*3/uL — ABNORMAL LOW (ref 0.1–1.0)
Monocytes Relative: 8 %
Neutro Abs: 0 10*3/uL — CL (ref 1.7–7.7)
Neutrophils Relative %: 0 %
Platelets: 7 10*3/uL — CL (ref 150–400)
RBC: 2.59 MIL/uL — ABNORMAL LOW (ref 3.87–5.11)
RDW: 20.8 % — ABNORMAL HIGH (ref 11.5–15.5)
Smear Review: DECREASED
WBC: 0.1 10*3/uL — CL (ref 4.0–10.5)
nRBC: 0 % (ref 0.0–0.2)

## 2022-02-17 LAB — MAGNESIUM: Magnesium: 0.7 mg/dL — CL (ref 1.7–2.4)

## 2022-02-17 LAB — RESP PANEL BY RT-PCR (RSV, FLU A&B, COVID)  RVPGX2
Influenza A by PCR: NEGATIVE
Influenza B by PCR: NEGATIVE
Resp Syncytial Virus by PCR: NEGATIVE
SARS Coronavirus 2 by RT PCR: NEGATIVE

## 2022-02-17 LAB — GRAM STAIN: Gram Stain: NONE SEEN

## 2022-02-17 LAB — TYPE AND SCREEN
ABO/RH(D): A POS
Antibody Screen: NEGATIVE

## 2022-02-17 LAB — LACTIC ACID, PLASMA
Lactic Acid, Venous: 2.4 mmol/L (ref 0.5–1.9)
Lactic Acid, Venous: 2 mmol/L (ref 0.5–1.9)

## 2022-02-17 LAB — BRAIN NATRIURETIC PEPTIDE: B Natriuretic Peptide: 370 pg/mL — ABNORMAL HIGH (ref 0.0–100.0)

## 2022-02-17 MED ORDER — SODIUM CHLORIDE 0.9% IV SOLUTION: Freq: Once | INTRAVENOUS | Status: DC

## 2022-02-17 MED ORDER — SODIUM CHLORIDE 0.9 % IV SOLN
2.0000 g | Freq: Two times a day (BID) | INTRAVENOUS | Status: DC
  Administered 2022-02-18: 2 g via INTRAVENOUS
  Filled 2022-02-17: qty 12.5

## 2022-02-17 MED ORDER — POTASSIUM CHLORIDE IN NACL 20-0.9 MEQ/L-% IV SOLN
INTRAVENOUS | Status: DC
  Filled 2022-02-17 (×2): qty 1000

## 2022-02-17 MED ORDER — DILTIAZEM HCL-DEXTROSE 125-5 MG/125ML-% IV SOLN (PREMIX)
5.0000 mg/h | INTRAVENOUS | Status: DC
  Administered 2022-02-17: 15 mg/h via INTRAVENOUS
  Administered 2022-02-17: 5 mg/h via INTRAVENOUS
  Filled 2022-02-17 (×2): qty 125

## 2022-02-17 MED ORDER — SODIUM CHLORIDE 0.9 % IV BOLUS
1000.0000 mL | Freq: Once | INTRAVENOUS | Status: AC
  Administered 2022-02-17: 1000 mL via INTRAVENOUS

## 2022-02-17 MED ORDER — METFORMIN HCL ER 500 MG PO TB24
500.0000 mg | ORAL_TABLET | Freq: Every day | ORAL | Status: DC
  Administered 2022-02-18: 500 mg via ORAL
  Filled 2022-02-17 (×2): qty 1

## 2022-02-17 MED ORDER — ALBUTEROL SULFATE (2.5 MG/3ML) 0.083% IN NEBU
2.5000 mg | INHALATION_SOLUTION | RESPIRATORY_TRACT | Status: DC | PRN
  Administered 2022-02-19 – 2022-02-21 (×2): 2.5 mg via RESPIRATORY_TRACT
  Filled 2022-02-17 (×2): qty 3

## 2022-02-17 MED ORDER — ONDANSETRON HCL 4 MG/2ML IJ SOLN: 4.0000 mg | Freq: Four times a day (QID) | INTRAMUSCULAR | Status: DC | PRN

## 2022-02-17 MED ORDER — ACETAMINOPHEN 325 MG PO TABS
650.0000 mg | ORAL_TABLET | Freq: Four times a day (QID) | ORAL | Status: DC | PRN
  Administered 2022-02-18 – 2022-02-19 (×3): 650 mg via ORAL
  Filled 2022-02-17 (×2): qty 2

## 2022-02-17 MED ORDER — SODIUM CHLORIDE 0.9 % IV SOLN
2.0000 g | Freq: Once | INTRAVENOUS | Status: AC
  Administered 2022-02-17: 2 g via INTRAVENOUS
  Filled 2022-02-17: qty 12.5

## 2022-02-17 MED ORDER — VANCOMYCIN HCL 10 G IV SOLR
2250.0000 mg | Freq: Once | INTRAVENOUS | Status: AC
  Administered 2022-02-17: 2250 mg via INTRAVENOUS
  Filled 2022-02-17: qty 22.5

## 2022-02-17 MED ORDER — INSULIN ASPART 100 UNIT/ML IJ SOLN
0.0000 [IU] | Freq: Three times a day (TID) | INTRAMUSCULAR | Status: DC
  Administered 2022-02-18 (×3): 2 [IU] via SUBCUTANEOUS
  Administered 2022-02-19: 3 [IU] via SUBCUTANEOUS
  Administered 2022-02-19 – 2022-02-23 (×2): 2 [IU] via SUBCUTANEOUS

## 2022-02-17 MED ORDER — MAGNESIUM SULFATE 2 GM/50ML IV SOLN
2.0000 g | Freq: Once | INTRAVENOUS | Status: AC
  Administered 2022-02-17: 2 g via INTRAVENOUS
  Filled 2022-02-17: qty 50

## 2022-02-17 MED ORDER — METOPROLOL TARTRATE 25 MG PO TABS: 25.0000 mg | ORAL_TABLET | Freq: Two times a day (BID) | ORAL | Status: DC

## 2022-02-17 MED ORDER — METOPROLOL TARTRATE 25 MG PO TABS
25.0000 mg | ORAL_TABLET | Freq: Two times a day (BID) | ORAL | Status: DC
  Administered 2022-02-17: 25 mg via ORAL
  Filled 2022-02-17: qty 1

## 2022-02-17 MED ORDER — PRAVASTATIN SODIUM 40 MG PO TABS
40.0000 mg | ORAL_TABLET | Freq: Every day | ORAL | Status: DC
  Administered 2022-02-17 – 2022-02-23 (×7): 40 mg via ORAL
  Filled 2022-02-17 (×7): qty 1

## 2022-02-17 MED ORDER — ACETAMINOPHEN 500 MG PO TABS
1000.0000 mg | ORAL_TABLET | Freq: Once | ORAL | Status: AC
  Administered 2022-02-17: 1000 mg via ORAL
  Filled 2022-02-17: qty 2

## 2022-02-17 MED ORDER — VANCOMYCIN HCL 1500 MG/300ML IV SOLN: 1500.0000 mg | INTRAVENOUS | Status: DC

## 2022-02-17 MED ORDER — ONDANSETRON HCL 4 MG PO TABS: 4.0000 mg | ORAL_TABLET | Freq: Four times a day (QID) | ORAL | Status: DC | PRN

## 2022-02-17 MED ORDER — ACETAMINOPHEN 650 MG RE SUPP: 650.0000 mg | Freq: Four times a day (QID) | RECTAL | Status: DC | PRN

## 2022-02-17 NOTE — Progress Notes (Addendum)
Kaitlyn Hull is a 66 y.o. female with past medical history h/o DM, HTN, chronic A-fib, AML on oral chemo with associated baseline pancytopenia presented to [facility] initially with generalized weakness, nausea and decreased appetite/oral intake .  Noted to be in rapid A-fib with RVR at [facility] and labs also showed worsening pancytopenia.  Patient initiated on platelet transfusion, per H&P -?  Plan to transfuse PRBC and transferred to [facility] ED for cardiology evaluation/admission here as deemed appropriate.  On arrival here patient also febrile and noted to have left facial cellulitis.  Patient states she had left external otitis couple of weeks back and finished antibiotic course more than a week back.  She had not noticed recurrent redness herself but does have a small tender swelling along left mandibular/preauricular area.  Patient seen and examined.   Vitals:   02/21/2022 1600 02/04/2022 1630  BP: (!) 145/92 (!) 155/86  Pulse: (!) 150 (!) 143  Resp: (!) 30 (!) 42  Temp:    SpO2: 97% 98%    Brief physical exam: Patient appears calm and cooperative.  ENT-small preauricular swelling with area of redness along left side of the face-left earlobe, periauricular area extending to the neck area (marked with pen) . Heart: Irregular rhythm, tachycardic.  No murmurs.  Lungs clear to auscultation, no wheezing or rhonchi.  Abdomen obese, soft and nontender.  No leg edema noted.  Neurologically intact.  Awake alert oriented x 3  Assessment and plan:   Please see H&P from this morning. Pancytopenia/febrile neutropenia: Unclear if patient actually received PRBC as noted in H&P (per patient she only received platelet transfusion)-discussed with [facility] hematology on-call Dr.Iruku who agreed with continuing antibiotics initiated in the ED for febrile neutropenia and left facial cellulitis-IV vancomycin/cefepime with serial labs.  Urine and blood cultures sent.  Hematology recommended to hold chemotherapy, they will evaluate  patient in a.m. and advise if G-CSF needed.   No signs of acute bleeding per discussion with patient-repeat CBC and transfuse for hemoglobin less than 7.  Patient s/p 1 unit platelet transfusion.  Will repeat CBC as discussed above. Rapid A-fib with RVR: Seen by cardiology and recommended to continue IV diltiazem with titration as needed for now since blood pressure tolerating.  If becomes hypotensive, might consider IV amiodarone.  Reduce IV fluid rate given history of CHF and holding home Lasix.  Hold anticoagulation/home dose of Eliquis given critical thrombocytopenia and bleeding risk.  Can resume home beta-blocker dose as blood pressure improving and elevated now.    Patient aware of above management plan and agrees to admission. She confirmed FULL CODE STATUS.  States daughter, Maretta Bees, would be emergency contact/POA in case of medical emergency.   Case discussed with cardiology APP, Ms. Wynetta Emery and hematology on-call [place] .  Admission orders to stepdown unit placed.  Critical care time spent: 45 minutes

## 2022-02-17 NOTE — ED Notes (Signed)
RN attempted second set of blood culture unsuccessful.

## 2022-02-17 NOTE — ED Triage Notes (Signed)
Pt brought in by [facility] from home with c/o fall this morning. Pt reports increased fatigue and weakness x 10 days and has fallen twice over the last 2 days. Denies injury from the fall, called EMS only because she couldn't get herself up off the floor. EMS found pt's HR to be 190, BP 113/62, temp 100, CBG 327. Pt reports not taking her medication yesterday.

## 2022-02-17 NOTE — Hospital Course (Signed)
66 year old female with a history of chronic atrial fibrillation, diabetes mellitus type 2, hypertension, AML morbid obesity presenting with 2-day history of generalized weakness, decreased oral intake, and falling.  The patient denies any fevers or chills.  She has had some nausea without any emesis.  She denies any diarrhea.  She denies any chest pain or shortness of breath or palpitations.  She has not had any dizziness or syncope.  She denies hitting her head.  She denies any pain at this time. She denies any hemoptysis, hematemesis, hematochezia, melena.  She had 1 episode of emesis on the morning of admission but denies any abdominal pain, dysuria, hematemesis.  There is no coffee-ground emesis.  She denies any coughing or sputum production.  She has not had any fevers or chills. She states that she has been compliant with all her medications except up to the last 2 days.  Because she was feeling nauseous and poorly, she decided not to take any of her medications including her apixaban. In the ED, the patient was afebrile and hemodynamically stable.  She was initially hypotensive with a blood pressure of 89/60.  She improved with IV fluids.  She was started on diltiazem drip.  Cardiology was consulted by EDP and recommended transfer to [facility].

## 2022-02-17 NOTE — ED Notes (Signed)
Pt arrived from [facility] for cardiology consult. Pt in afib with RVR, on Cardizem at 15/hr. Alert and oriented. VSS. Denies cardiac s/s.

## 2022-02-17 NOTE — Progress Notes (Signed)
Pharmacy Antibiotic Note  Kaitlyn Hull is a 66 y.o. female admitted on 02/04/2022 with  febrile neutropenia .  Pharmacy has been consulted for vancomycin and cefepime dosing.  Plan: Start cefepime 2g every 12 hours.  Give IV Vancomycin '2250mg'$  x 1 for loading dose, followed by IV Vancomycin 1500 every 48 hours for eAUC470. Scr used 1.61, Vd of 0.50.  Follow culture data for de-escalation.  Monitor renal function for dose adjustments as indicated.   Height: '5\' 2"'$  (157.5 cm) Weight: 117 kg (258 lb) IBW/kg (Calculated) : 50.1  Temp (24hrs), Avg:99.9 F (37.7 C), Min:98.4 F (36.9 C), Max:103.2 F (39.6 C)  Recent Labs  Lab 02/04/2022 0845 02/26/2022 0846 02/04/2022 1040  WBC 0.1*  --   --   CREATININE 1.61*  --   --   LATICACIDVEN  --  2.4* 2.0*    Estimated Creatinine Clearance: 42.3 mL/min (A) (by C-G formula based on SCr of 1.61 mg/dL (H)).    No Known Allergies  Thank you for allowing pharmacy to be a part of this patient's care.  Esmeralda Arthur, PharmD, BCCCP  02/07/2022 6:11 PM

## 2022-02-17 NOTE — ED Provider Notes (Signed)
[facility] EMERGENCY DEPARTMENT Provider Note   CSN: [id number] Arrival date & time: 02/03/2022  6962     History  Chief Complaint  Patient presents with   Weakness    Kaitlyn Hull is a 66 y.o. female.  HPI Patient presents with 2 family to assist with history.  She has a history of AML and is receiving oral chemotherapy.  She presents due to concern of weakness that is progressed over the past few days, and new frequency of falls.  She denies any focal pain, family notes that twice over the past 24 hours in particular she has had ground-level falls.  Though she is on Eliquis, has a history of atrial fibrillation she has not been taking her medication for at least 48 hours.    Home Medications Prior to Admission medications   Medication Sig Start Date End Date Taking? Authorizing Provider  acetaminophen (TYLENOL) 500 MG tablet Take 500 mg by mouth every 6 (six) hours as needed. Taken as needed for headache    [provider]  ALPRAZolam Duanne Moron) 0.5 MG tablet Take 1 tablet by mouth 2 (two) times daily as needed. 10/30/21 10/30/22  [provider]  apixaban (ELIQUIS) 5 MG TABS tablet Take 5 mg by mouth 2 (two) times daily. 1/2 tablet BID    [provider]  furosemide (LASIX) 20 MG tablet Take 1 tablet (20 mg total) by mouth daily as needed. 11/30/21   Derek Jack, MD  LORazepam (ATIVAN) 0.5 MG tablet Take 1 tablet (0.5 mg total) by mouth 2 (two) times daily as needed for anxiety. 12/19/21   Derek Jack, MD  magnesium oxide (MAG-OX) 400 (240 Mg) MG tablet Take 1 tablet by mouth 2 (two) times daily. 12/26/21   [provider]  metFORMIN (GLUCOPHAGE-XR) 500 MG 24 hr tablet Take 500-1,000 mg by mouth See admin instructions. Take 500 mg by mouth in the morning and 1000 mg at bedtime 03/26/18   [provider]  metoprolol tartrate (LOPRESSOR) 100 MG tablet Take 1 tablet (100 mg total) by mouth 2 (two) times daily. 11/23/21 11/18/22   Arnoldo Lenis, MD  pravastatin (PRAVACHOL) 40 MG tablet Take 40 mg by mouth at bedtime.    [provider]  venetoclax (VENCLEXTA) 100 MG tablet Take by mouth. 01/23/22   [provider]      Allergies    Patient has no known allergies.    Review of Systems   Review of Systems  Hematological:        Anticipated stem cell transfer end of next month  All other systems reviewed and are negative.   Physical Exam Updated Vital Signs BP 95/79   Pulse (!) 155   Temp 99.3 F (37.4 C) (Oral)   Resp (!) 23   Ht '5\' 2"'$  (1.575 m)   Wt 117 kg   LMP 11/17/2014   SpO2 100%   BMI 47.19 kg/m  Physical Exam Vitals and nursing note reviewed.  Constitutional:      General: She is not in acute distress.    Appearance: She is well-developed.  HENT:     Head: Normocephalic and atraumatic.  Eyes:     Conjunctiva/sclera: Conjunctivae normal.  Cardiovascular:     Rate and Rhythm: Tachycardia present. Rhythm irregular.  Pulmonary:     Effort: Pulmonary effort is normal. No respiratory distress.     Breath sounds: Normal breath sounds. No stridor.  Abdominal:     General: There is no distension.  Skin:    General: Skin is warm and dry.  Neurological:     Mental Status: She is alert and oriented to person, place, and time.     Cranial Nerves: No cranial nerve deficit.     Comments: Age-appropriate atrophy otherwise no focal deficits  Psychiatric:        Mood and Affect: Mood normal.     ED Results / Procedures / Treatments   Labs (all labs ordered are listed, but only abnormal results are displayed) Labs Reviewed  COMPREHENSIVE METABOLIC PANEL - Abnormal; Notable for the following components:      Result Value   Sodium 130 (*)    CO2 21 (*)    Glucose, Bld 223 (*)    BUN 27 (*)    Creatinine, Ser 1.61 (*)    Albumin 3.3 (*)    Total Bilirubin 3.1 (*)    GFR, Estimated 35 (*)    All other components within normal limits  CBC WITH DIFFERENTIAL/PLATELET -  Abnormal; Notable for the following components:   WBC 0.1 (*)    RBC 2.59 (*)    Hemoglobin 7.9 (*)    HCT 23.0 (*)    RDW 20.8 (*)    Platelets 7 (*)    Neutro Abs 0.0 (*)    Lymphs Abs 0.1 (*)    Monocytes Absolute 0.0 (*)    All other components within normal limits  LACTIC ACID, PLASMA - Abnormal; Notable for the following components:   Lactic Acid, Venous 2.4 (*)    All other components within normal limits  LACTIC ACID, PLASMA - Abnormal; Notable for the following components:   Lactic Acid, Venous 2.0 (*)    All other components within normal limits  MAGNESIUM - Abnormal; Notable for the following components:   Magnesium 0.7 (*)    All other components within normal limits  BRAIN NATRIURETIC PEPTIDE - Abnormal; Notable for the following components:   B Natriuretic Peptide 370.0 (*)    All other components within normal limits  RESP PANEL BY RT-PCR (RSV, FLU A&B, COVID)  RVPGX2  TYPE AND SCREEN  PREPARE PLATELET PHERESIS    EKG EKG Interpretation  Date/Time:  Saturday February 17 2022 08:44:19 EST Ventricular Rate:  204 PR Interval:    QRS Duration: 78 QT Interval:  212 QTC Calculation: 391 R Axis:   47 Text Interpretation: Atrial fibrillation with rapid V-rate Repolarization abnormality, prob rate related Baseline wander in lead(s) V4 Abnormal ECG Confirmed by Carmin Muskrat ([id number]) on 02/28/2022 9:58:55 AM  Radiology CT Head Wo Contrast  Result Date: 03/04/2022 CLINICAL DATA:  Trauma, fall EXAM: CT HEAD WITHOUT CONTRAST TECHNIQUE: Contiguous axial images were obtained from the base of the skull through the vertex without intravenous contrast. RADIATION DOSE REDUCTION: This exam was performed according to the departmental dose-optimization program which includes automated exposure control, adjustment of the mA and/or kV according to patient size and/or use of iterative reconstruction technique. COMPARISON:  None Available. FINDINGS: Brain: No acute intracranial  findings are seen. There are no signs of bleeding within the cranium. Ventricles are not dilated. Cortical sulci are prominent. There is no focal edema or mass effect. Vascular: Unremarkable. Skull: Unremarkable. Sinuses/Orbits: Unremarkable. Other: None. IMPRESSION: No acute intracranial findings are seen in noncontrast CT brain. Electronically Signed   By: Elmer Picker M.D.   On: 02/15/2022 10:58   DG Chest Port 1 View  Result Date: 02/21/2022 CLINICAL DATA:  Tachycardia and weakness EXAM: PORTABLE CHEST 1 VIEW COMPARISON:  12/07/2021 and prior studies FINDINGS: This is a low volume study. Cardiomegaly and LEFT PICC line with tip overlying the RIGHT atrium again noted. Defibrillator/pacing pads overlying the chest are noted. There is no evidence of focal airspace disease, pulmonary edema, suspicious pulmonary nodule/mass, pleural effusion, or pneumothorax. No acute bony abnormalities are identified. IMPRESSION: Cardiomegaly without evidence of acute cardiopulmonary disease. Electronically Signed   By: Margarette Canada M.D.   On: 02/18/2022 09:06    Procedures Procedures    Medications Ordered in ED Medications  0.9 %  sodium chloride infusion (Manually program via Guardrails IV Fluids) (has no administration in time range)  diltiazem (CARDIZEM) 125 mg in dextrose 5% 125 mL (1 mg/mL) infusion (12.5 mg/hr Intravenous Rate/Dose Change 02/12/2022 1252)  0.9 % NaCl with KCl 20 mEq/ L  infusion (has no administration in time range)  sodium chloride 0.9 % bolus 1,000 mL (0 mLs Intravenous Stopped 02/16/2022 1035)  magnesium sulfate IVPB 2 g 50 mL (0 g Intravenous Stopped 02/10/2022 1144)    ED Course/ Medical Decision Making/ A&P This patient with a Hx of AML, A-fib, and anticoagulation and immunosuppressants presents to the ED for concern of weakness, new frequency of falls, arrhythmia, this involves an extensive number of treatment options, and is a complaint that carries with it a high risk of  complications and morbidity.    The differential diagnosis includes medication noncompliance, intracranial injury, dehydration, medication effects from chemotherapeutic agents, bacteremia, sepsis   Social Determinants of Health:  Active cancer, age  Additional history obtained:  Additional history and/or information obtained from family at bedside and chart review, notable for ongoing efforts for AML therapy including oral therapeutic regimen.  Patient also has multiple prior episodes of neutropenia   After the initial evaluation, orders, including: Fluids labs x-ray CT were initiated.   Patient placed on Cardiac and Pulse-Oximetry Monitors. The patient was maintained on a cardiac monitor.  The cardiac monitored showed an rhythm of 180, 200, A-fib with rapid ventricular response abnormal The patient was also maintained on pulse oximetry. The readings were typically in the 9% room air normal   On repeat evaluation of the patient stayed the same 12:58 PM Patient's initial hypotension has improved substantially with initial fluid bolus.  Heart rate has diminished somewhat, now in the 160, 180 range.  Have discussed initial findings which are most notable for neutropenia, thrombocytopenia with platelet count of 7.  We discussed indications for transfusion and the patient is amenable to this.  Lab Tests:  I personally interpreted labs.  The pertinent results include: Thrombocytopenia, neutropenia  Imaging Studies ordered:  I independently visualized and interpreted imaging which showed no intracranial abnormality, no chest x-ray abnormality I agree with the radiologist interpretation  Consultations Obtained:  I requested consultation with the cardiology, Dr. Gilman Schmidt,  and discussed lab and imaging findings as well as pertinent plan - they recommend: Transfer to [facility] for cardiology consultation, admission to hospitalist, agreement on diltiazem.  Patient should not receive additional  anticoagulation as she was thrombocytopenic to 7.  Dispostion / Final MDM:  After consideration of the diagnostic results and the patient's response to treatment, adult female presents with weakness, new frequency of falls.  Patient is essentially asymptomatic, but with concern for trauma she had imaging studies performed and with concern for etiology for her weakness, labs, with broad differential as above.  Findings of multiple substantial abnormalities including thrombocytopenia with platelet count of 7 requiring initiated transfusion to which she was amenable.  Patient  found to have neutropenia, with ANC 0, no obvious source of fever and she is afebrile.  Patient's blood pressure was low on arrival, but with fluids this improved, and her heart rate reduced.  Additional heart rate reduction provided with Cardizem as the patient was in atrial fibrillation with rapid ventricular response.  Given the onset of symptoms, absence of recent Eliquis usage, patient was not a candidate for emergent cardioversion.  Clinically the patient appears better than she did numerically, but with concern for her substantial abnormalities I discussed case with our cardiology colleagues, internal medicine colleagues and she will be transferred to internal medicine team at our advanced care center.  Dr. Carles Collet, Dr. Langston Masker, both facilitating care of the patient in transfer.  Final Clinical Impression(s) / ED Diagnoses Final diagnoses:  Atrial fibrillation with rapid ventricular response (HCC)  Thrombocytopenia (HCC)  Chemotherapy-induced neutropenia ([place])   CRITICAL CARE Performed by: Carmin Muskrat Total critical care time: 45 minutes Critical care time was exclusive of separately billable procedures and treating other patients. Critical care was necessary to treat or prevent imminent or life-threatening deterioration. Critical care was time spent personally by me on the following activities: development of treatment plan  with patient and/or surrogate as well as nursing, discussions with consultants, evaluation of patient's response to treatment, examination of patient, obtaining history from patient or surrogate, ordering and performing treatments and interventions, ordering and review of laboratory studies, ordering and review of radiographic studies, pulse oximetry and re-evaluation of patient's condition.    Carmin Muskrat, MD 02/16/2022 1300

## 2022-02-17 NOTE — ED Notes (Signed)
EDP aware of heart rate 

## 2022-02-17 NOTE — H&P (Signed)
History and Physical    Patient: Kaitlyn Hull IWP:[id number] DOB: February 04, 1956 DOA: 02/20/2022 DOS: the patient was seen and examined on 02/08/2022 PCP: Ledell Noss, [facility]  Patient coming from: Home  Chief Complaint:  Chief Complaint  Patient presents with   Weakness   HPI: Kaitlyn Hull is a 66 year old female with a history of chronic atrial fibrillation, diabetes mellitus type 2, hypertension, AML morbid obesity presenting with 2-day history of generalized weakness, decreased oral intake, and falling.  The patient denies any fevers or chills.  She has had some nausea without any emesis.  She denies any diarrhea.  She denies any chest pain or shortness of breath or palpitations.  She has not had any dizziness or syncope.  She denies hitting her head.  She denies any pain at this time. She denies any hemoptysis, hematemesis, hematochezia, melena.  She had 1 episode of emesis on the morning of admission but denies any abdominal pain, dysuria, hematemesis.  There is no coffee-ground emesis.  She denies any coughing or sputum production.  She has not had any fevers or chills. She states that she has been compliant with all her medications except up to the last 2 days.  Because she was feeling nauseous and poorly, she decided not to take any of her medications including her apixaban. In the ED, the patient was afebrile and hemodynamically stable.  She was initially hypotensive with a blood pressure of 89/60.  She improved with IV fluids.  She was started on diltiazem drip.  Cardiology was consulted by EDP and recommended transfer to [facility].  Review of Systems: As mentioned in the history of present illness. All other systems reviewed and are negative. Past Medical History:  Diagnosis Date   Atrial fibrillation with RVR ([place]) 06/2013   Breast cancer ([place]) 2007   left   Breast disorder    cancer   Diabetes mellitus    Type II   Dysrhythmia    Hematuria 01/20/2014   Hypertension     Hyperthyroidism 06/2013   Leukemia ([place])    Obesity    PMB (postmenopausal bleeding) 07/24/2012   Had 16.1 mm endometrium on [place] will get endo biopsy   Psoriasis    Urinary frequency 01/20/2014   Past Surgical History:  Procedure Laterality Date   BREAST BIOPSY Left 09/14/2020   Procedure: BREAST BIOPSY;  Surgeon: Aviva Signs, MD;  Location: [facility] ORS;  Service: General;  Laterality: Left;   BREAST SURGERY  02/20/06   left side    CESAREAN SECTION  1995   COLONOSCOPY WITH PROPOFOL N/A 10/28/2021   Procedure: COLONOSCOPY WITH PROPOFOL;  Surgeon: Daneil Dolin, MD;  Location: [facility] ENDO SUITE;  Service: Endoscopy;  Laterality: N/A;   ESOPHAGOGASTRODUODENOSCOPY (EGD) WITH PROPOFOL N/A 10/28/2021   Procedure: ESOPHAGOGASTRODUODENOSCOPY (EGD) WITH PROPOFOL;  Surgeon: Daneil Dolin, MD;  Location: [facility] ENDO SU[facility];  Service: Endoscopy;  Laterality: N/A;   HYSTEROSCOPY WITH D & C N/A 08/13/2012   Procedure: DILATATION AND CURETTAGE /HYSTEROSCOPY;  Surgeon: Florian Buff, MD;  Location: [facility] ORS;  Service: Gynecology;  Laterality: N/A;   SECONDARY CLOSURE OF WOUND Left 02/20/2021   Procedure: SIMPLE WOUND CLOSURE;  Surgeon: Aviva Signs, MD;  Location: [facility] ORS;  Service: General;  Laterality: Left;   Social History:  reports that she quit smoking about 18 years ago. Her smoking use included cigarettes. She started smoking about 45 years ago. She has a 0.01 pack-year smoking history. She has never used smokeless tobacco.  She reports that she does not drink alcohol and does not use drugs.  No Known Allergies  Family History  Problem Relation Age of Onset   Heart failure Father    CAD Father    Diabetes Sister    Other Sister        blood clots   Breast cancer Sister    Alzheimer's disease Mother    Diabetes Sister    Hypertension Maternal Grandmother    Colon cancer Neg Hx     Prior to Admission medications   Medication Sig Start Date End Date Taking? Authorizing Provider  acetaminophen  (TYLENOL) 500 MG tablet Take 500 mg by mouth every 6 (six) hours as needed. Taken as needed for headache    [provider]  ALPRAZolam Duanne Moron) 0.5 MG tablet Take 1 tablet by mouth 2 (two) times daily as needed. 10/30/21 10/30/22  [provider]  apixaban (ELIQUIS) 5 MG TABS tablet Take 5 mg by mouth 2 (two) times daily. 1/2 tablet BID    [provider]  furosemide (LASIX) 20 MG tablet Take 1 tablet (20 mg total) by mouth daily as needed. 11/30/21   Derek Jack, MD  LORazepam (ATIVAN) 0.5 MG tablet Take 1 tablet (0.5 mg total) by mouth 2 (two) times daily as needed for anxiety. 12/19/21   Derek Jack, MD  magnesium oxide (MAG-OX) 400 (240 Mg) MG tablet Take 1 tablet by mouth 2 (two) times daily. 12/26/21   [provider]  metFORMIN (GLUCOPHAGE-XR) 500 MG 24 hr tablet Take 500-1,000 mg by mouth See admin instructions. Take 500 mg by mouth in the morning and 1000 mg at bedtime 03/26/18   [provider]  metoprolol tartrate (LOPRESSOR) 100 MG tablet Take 1 tablet (100 mg total) by mouth 2 (two) times daily. 11/23/21 11/18/22  Arnoldo Lenis, MD  pravastatin (PRAVACHOL) 40 MG tablet Take 40 mg by mouth at bedtime.    [provider]  venetoclax Lynita Lombard) 100 MG tablet Take by mouth. 01/23/22   [provider]    Physical Exam: Vitals:   02/11/2022 1045 02/09/2022 1100 02/22/2022 1115 03/02/2022 1145  BP: (!) 116/91 109/86 99/74 111/85  Pulse:  (!) 158 (!) 106 (!) 158  Resp: (!) 32 (!) 24 (!) 37 (!) 32  Temp:      TempSrc:      SpO2:  100% 98% 100%  Weight:      Height:       GENERAL:  A&O x 3, NAD, well developed, cooperative, follows commands HEENT: [employer]/AT, No thrush, No icterus, No oral ulcers Neck:  No neck mass, No meningismus, soft, supple CV: RRR, no S3, no S4, no rub, no JVD Lungs:  CTA, no wheeze, no rhonchi, good air movement Abd: soft/NT +BS, nondistended Ext: No edema, no lymphangitis, no cyanosis, no  rashes Neuro:  CN II-XII intact, strength 4/5 in RUE, RLE, strength 4/5 LUE, LLE; sensation intact bilateral; no dysmetria; babinski equivocal  Data Reviewed: Data reviewed above in the history  Assessment and Plan: Atrial fibrillation with RVR -Continue diltiazem drip -May need to start amiodarone with soft blood pressures and difficult to control heart rate -Cardiology consulted>> transfer to [facility] since no cardiology available at [facility] on the weekend -TSH -07/18/2021 echo EF 50-35%, no WMA, mild reduced RV EF, mild MR, moderate TR -I have also discussed the case with EDP, Dr. Alvy Beal will be ED to ED transfer  Acute myeloid leukemia/Pancytopenia -The patient has chronic severe leukopenia  with WBC ranging from 0.1 to 1.5 -Case was discussed with hematology, Dr. Melanie Crazier Venetoclax -transfuse 1 unit PRBC -no need to start abx or give G-CSF since pt is afebrile -she is also followed at [facility] for future allogeneic BMT -transfuse one unit PRBC -hold apixaban temporarily due to severe thrombocytopenia  Acute on chronic renal failure--CKD stage IIIa -Baseline creatinine 0.9-1.2 -Presented with serum creatinine 1.61 -Secondary to volume depletion -Gentle fluid hydration  diabetes mellitus type 2 with hyperglycemia -NovoLog sliding scale -Holding metformin -10/26/2021 hemoglobin A1c 6.2  Morbid obesity -BMI 47.19 -Lifestyle modification    Advance Care Planning: FULL  Consults: cardiology  Family Communication: significant other updated  Severity of Illness: The appropriate patient status for this patient is INPATIENT. Inpatient status is judged to be reasonable and necessary in order to provide the required intensity of service to ensure the patient's safety. The patient's presenting symptoms, physical exam findings, and initial radiographic and laboratory data in the context of their chronic comorbidities is felt to place them at high risk for further  clinical deterioration. Furthermore, it is not anticipated that the patient will be medically stable for discharge from the hospital within 2 midnights of admission.   * I certify that at the point of admission it is my clinical judgment that the patient will require inpatient hospital care spanning beyond 2 midnights from the point of admission due to high intensity of service, high risk for further deterioration and high frequency of surveillance required.*  Author: Orson Eva, MD 03/04/2022 12:06 PM  For on call review [email or web address].

## 2022-02-17 NOTE — ED Provider Notes (Signed)
  Physical Exam  BP (!) 158/98   Pulse (!) 154   Temp (!) 103.2 F (39.6 C)   Resp (!) 22   Ht '5\' 2"'$  (1.575 m)   Wt 117 kg   LMP 11/17/2014   SpO2 95%   BMI 47.19 kg/m   Physical Exam Vitals and nursing note reviewed.  Constitutional:      General: She is not in acute distress.    Appearance: She is well-developed. She is obese.  HENT:     Head: Normocephalic and atraumatic.     Nose: Nose normal.     Mouth/Throat:     Mouth: Mucous membranes are moist.     Pharynx: Oropharynx is clear.  Eyes:     Conjunctiva/sclera: Conjunctivae normal.  Cardiovascular:     Rate and Rhythm: Tachycardia present. Rhythm irregular.     Heart sounds: No murmur heard. Pulmonary:     Effort: Pulmonary effort is normal. No respiratory distress.     Breath sounds: Normal breath sounds.  Abdominal:     Palpations: Abdomen is soft.     Tenderness: There is no abdominal tenderness.  Musculoskeletal:     Cervical back: Neck supple.  Skin:    General: Skin is warm and dry.     Capillary Refill: Capillary refill takes less than 2 seconds.  Neurological:     General: No focal deficit present.     Mental Status: She is alert and oriented to person, place, and time.  Psychiatric:        Mood and Affect: Mood normal.       Procedures  Procedures  ED Course / MDM    Medical Decision Making Amount and/or Complexity of Data Reviewed Labs: ordered. Radiology: ordered.  Risk Prescription drug management.   Patient's presentation is most consistent with acute presentation with potential threat to life or bodily function.  The patient is a 66 year old female with past medical history of breast cancer (in remission), AML, currently receiving oral chemotherapy, A-fib on Eliquis with current medication noncompliance presenting as a transfer from [facility] for several days of weakness.  At the outside facility, the patient was found to be neutropenic with an ANC of 0, thrombocytopenic with a  platelet count of 7.  The patient was also in A-fib with a rate in the 200s.  She was maxed out on diltiazem.  The provider at the OSH spoke with cardiology at [facility] and the patient was recommended for transfer for further evaluation.  The decision to start amiodarone was deferred until in person evaluation by cardiology.  On arrival, the patient is alert and oriented and hemodynamically stable.  She is currently in A-fib with a rate in the 160s.  She denies chest pain, shortness of breath, palpitations.  On intake vitals, the patient was noted to be febrile with a temperature of 103.2 F.  She has an area of erythema and induration involving the left ear and angle of the jaw which could poses a potential source of infection.  A possible transfusion reaction to the platelets was also considered.  The patient was initiated on broad-spectrum antibiotics and the platelet transfusion was stopped.  Cardiology was consulted and was planning to evaluate the patient prior to starting amiodarone.  The patient was admitted to the hospital service for further management.      Dani Gobble, MD 02/15/2022 1640    Drenda Freeze, MD 02/18/22 [phone]

## 2022-02-17 NOTE — Consult Note (Addendum)
Cardiology Consultation   Kaitlyn Hull ID: Kaitlyn Hull MRN: [id number]; DOB: 31-Jan-1956  Admit date: 03/04/2022 Date of Consult: 02/04/2022  PCP:  Kaitlyn Hull, [place] [facility] Cardiologist:  Kaitlyn Dolly, MD      Kaitlyn Hull Profile:   Kaitlyn Hull is a 66 y.o. female with a hx of chronic atrial fibrillation, type 2 DM, HTN, AML, morbid obesity who is being seen 02/09/2022 for the evaluation of atrial fibrillation at the request of Dr. Darl Householder.  History of Present Illness:   Kaitlyn Hull is a 66 year old female with above medical history who is followed by Dr. Harl Bowie.   Kaitlyn Hull established care with cardiology in 06/2013 during an admission for chest pain. Upon presentation to the ED, she was found to be in new onset atrial fibrillation with RVR. Echocardiogram on 06/27/2013 showed EF 55-60%, no regional wall motion abnormalities. She was discharged on digoxin, metoprolol, diltiazem, and eliquis. For the next several years, she was only seen by cardiology as an outpatient and was overall asymptomatic while in afib. Digoxin was discontinued in 2020.   Kaitlyn Hull was admitted to the hospital in 10/2021 for symptomatic anemia (hemoglobin 4.7) and her eliquis was held. She also had issues with hyptension and diltiazem was stopped. Kaitlyn Hull was last seen by cardiology on 11/23/2021. At that time, Kaitlyn Hull was treated with lopressor 100 mg BID. Remained off eliquis due to thrombocytopenia related to her AML and ongoing chemotherapy. Also remained of dilt due to low BP.   Kaitlyn Hull was brought into [facility] ED on 12/16 after she had a fall that morning. She reported that she had been having increased fatigue and weakness for the past 10 days and had fallen 2 times in the past 2 days. EMS found Kaitlyn Hull's HR to be 190, BP 113/62. Kaitlyn Hull had not taken her medications the day prior. EKG in the ED showed atrial fibrillation with HR 204 BPM. Labs showed Na 130, K 3.8, creatinine  1.61, mag 0.7, WBC 0.1, hemoglobin 7.9, platelets 7, neutrophils 0. Lactic acid 2.4. BNP 370.   CXR showed cardiomegaly without evidence of acute cardiopulmonary disease. CT head showed no acute intracranial findings. Kaitlyn Hull was started on IV antibiotics, IV diltiazem and was transferred to [facility] for further evaluation. Upon arrival, found to have a fever of 103.2 F.    On interview, Kaitlyn Hull reports that she has had noticeable fatigue and weakness for the past week. Had some nausea and one episode of vomiting this AM. Has had a decreased appetite and poor PO intake for the past few days. Denies dizziness, syncope, near syncope. Denies noticeable fever, chills, body aches, cough, diarrhea,nasal congestion. She has had atrial fibrillation since 2015 and it has overall been very well controlled. Today, her HR is in the 130s-200s. She is unable to tell that her HR is going that fast. Denies palpitations, fluttering in chest, chest pain, sob. No dizziness, syncope, near syncope. In the ED, she reports feeling very cold and is shivering/shaking.   Past Medical History:  Diagnosis Date   Atrial fibrillation with RVR ([place]) 06/2013   Breast cancer ([place]) 2007   left   Breast disorder    cancer   Diabetes mellitus    Type II   Dysrhythmia    Hematuria 01/20/2014   Hypertension    Hyperthyroidism 06/2013   Leukemia ([place])    Obesity    PMB (postmenopausal bleeding) 07/24/2012   Had 16.1 mm endometrium on [place] will  get endo biopsy   Psoriasis    Urinary frequency 01/20/2014    Past Surgical History:  Procedure Laterality Date   BREAST BIOPSY Left 09/14/2020   Procedure: BREAST BIOPSY;  Surgeon: Aviva Signs, MD;  Location: [facility] ORS;  Service: General;  Laterality: Left;   BREAST SURGERY  02/20/06   left side    CESAREAN SECTION  1995   COLONOSCOPY WITH PROPOFOL N/A 10/28/2021   Procedure: COLONOSCOPY WITH PROPOFOL;  Surgeon: Daneil Dolin, MD;  Location: [facility];  Service: Endoscopy;   Laterality: N/A;   ESOPHAGOGASTRODUODENOSCOPY (EGD) WITH PROPOFOL N/A 10/28/2021   Procedure: ESOPHAGOGASTRODUODENOSCOPY (EGD) WITH PROPOFOL;  Surgeon: Daneil Dolin, MD;  Location: [facility];  Service: Endoscopy;  Laterality: N/A;   HYSTEROSCOPY WITH D & C N/A 08/13/2012   Procedure: DILATATION AND CURETTAGE /HYSTEROSCOPY;  Surgeon: Florian Buff, MD;  Location: [facility];  Service: Gynecology;  Laterality: N/A;   SECONDARY CLOSURE OF WOUND Left 02/20/2021   Procedure: SIMPLE WOUND CLOSURE;  Surgeon: Aviva Signs, MD;  Location: [facility];  Service: General;  Laterality: Left;     Home Medications:  Prior to Admission medications   Medication Sig Start Date End Date Taking? Authorizing Provider  acetaminophen (TYLENOL) 500 MG tablet Take 500 mg by mouth every 6 (six) hours as needed. Taken as needed for headache    [provider]  ALPRAZolam Duanne Moron) 0.5 MG tablet Take 1 tablet by mouth 2 (two) times daily as needed. 10/30/21 10/30/22  [provider]  apixaban (ELIQUIS) 5 MG TABS tablet Take 5 mg by mouth 2 (two) times daily. 1/2 tablet BID    [provider]  furosemide (LASIX) 20 MG tablet Take 1 tablet (20 mg total) by mouth daily as needed. 11/30/21   Derek Jack, MD  LORazepam (ATIVAN) 0.5 MG tablet Take 1 tablet (0.5 mg total) by mouth 2 (two) times daily as needed for anxiety. 12/19/21   Derek Jack, MD  magnesium oxide (MAG-OX) 400 (240 Mg) MG tablet Take 1 tablet by mouth 2 (two) times daily. 12/26/21   [provider]  metFORMIN (GLUCOPHAGE-XR) 500 MG 24 hr tablet Take 500-1,000 mg by mouth See admin instructions. Take 500 mg by mouth in the morning and 1000 mg at bedtime 03/26/18   [provider]  metoprolol tartrate (LOPRESSOR) 100 MG tablet Take 1 tablet (100 mg total) by mouth 2 (two) times daily. 11/23/21 11/18/22  Arnoldo Lenis, MD  pravastatin (PRAVACHOL) 40 MG tablet Take 40 mg by mouth at bedtime.    [provider]  venetoclax (VENCLEXTA) 100 MG tablet Take by mouth. 01/23/22   [provider]    Inpatient Medications: Scheduled Meds:  sodium chloride   Intravenous Once   metoprolol tartrate  25 mg Oral BID   Continuous Infusions:  0.9 % NaCl with KCl 20 mEq / L 75 mL/hr at 02/08/2022 1314   ceFEPime (MAXIPIME) IV     diltiazem (CARDIZEM) infusion 15 mg/hr (02/16/2022 1317)   vancomycin     PRN Meds:   Allergies:   No Known Allergies  Social History:   Social History   Socioeconomic History   Marital status: Widowed    Spouse name: Not on file   Number of children: Not on file   Years of education: Not on file   Highest education level: Not on file  Occupational History   Not on file  Tobacco Use   Smoking status: Former  Packs/day: 0.01    Years: 1.00    Total pack years: 0.01    Types: Cigarettes    Start date: 03/05/1976    Quit date: 03/06/2003    Years since quitting: 18.9   Smokeless tobacco: Never  Vaping Use   Vaping Use: Never used  Substance and Sexual Activity   Alcohol use: No    Alcohol/week: 0.0 standard drinks of alcohol   Drug use: No   Sexual activity: Not Currently    Birth control/protection: Post-menopausal  Other Topics Concern   Not on file  Social History Narrative   Not on file   Social Determinants of Health   Financial Resource Strain: Not on file  Food Insecurity: No Food Insecurity (12/10/2021)   Hunger Vital Sign    Worried About Running Out of Food in the Last Year: Never true    Ran Out of Food in the Last Year: Never true  Transportation Needs: No Transportation Needs (12/10/2021)   PRAPARE - Hydrologist (Medical): No    Lack of Transportation (Non-Medical): No  Physical Activity: Not on file  Stress: Not on file  Social Connections: Not on file  Intimate Partner Violence: Not At Risk (12/10/2021)   Humiliation, Afraid, Rape, and Kick questionnaire    Fear of Current or Ex-Partner:  No    Emotionally Abused: No    Physically Abused: No    Sexually Abused: No    Family History:    Family History  Problem Relation Age of Onset   Heart failure Father    CAD Father    Diabetes Sister    Other Sister        blood clots   Breast cancer Sister    Alzheimer's disease Mother    Diabetes Sister    Hypertension Maternal Grandmother    Colon cancer Neg Hx      ROS:  Please see the history of present illness.   All other ROS reviewed and negative.     Physical Exam/Data:   Vitals:   02/13/2022 1530 02/16/2022 1545 02/15/2022 1600 02/16/2022 1630  BP: (!) 141/64 (!) 158/119 (!) 145/92 (!) 155/86  Pulse: (!) 140 (!) 152 (!) 150 (!) 143  Resp: (!) 32 (!) 31 (!) 30 (!) 42  Temp:      TempSrc:      SpO2: 96% 99% 97% 98%  Weight:      Height:        Intake/Output Summary (Last 24 hours) at 02/25/2022 1651 Last data filed at 02/09/2022 1515 Gross per 24 hour  Intake 174 ml  Output --  Net 174 ml      02/10/2022    8:42 AM 02/05/2022    1:39 PM 01/29/2022    1:00 PM  Last 3 Weights  Weight (lbs) 258 lb 257 lb 12.8 oz 253 lb 1.4 oz  Weight (kg) 117.028 kg 116.937 kg 114.8 kg     Body mass index is 47.19 kg/m.  General:  Pleasant, obese female. Diaphoretic, shivering in the bed under multiple blankets. HEENT: normal, lips are pale  Neck: no JVD Vascular: Radial pulses 2+ bilaterally Cardiac:  normal S1, S2; irregular rate and rhythm, tachycardic  Lungs:  clear to auscultation bilaterally, no wheezing, rhonchi or rales. Normal WOB  Abd: soft, nontender, no hepatomegaly  Ext: Trace edema in BLE Musculoskeletal:  No deformities, BUE and BLE strength normal and equal Skin: cool to the touch  Neuro:  CNs 2-12  intact, no focal abnormalities noted Psych:  Normal affect   EKG:  The EKG was personally reviewed and demonstrates:  afib, HR 209  Telemetry:  Telemetry was personally reviewed and demonstrates:  atrial fibrillation, HR in the 130s-170s   Relevant CV  Studies:   Laboratory Data:  High Sensitivity Troponin:  No results for input(s): "TROPONINIHS" in the last 720 hours.   Chemistry Recent Labs  Lab 03/01/2022 0845 02/25/2022 0902  NA 130*  --   K 3.8  --   CL 98  --   CO2 21*  --   GLUCOSE 223*  --   BUN 27*  --   CREATININE 1.61*  --   CALCIUM 8.9  --   MG  --  0.7*  GFRNONAA 35*  --   ANIONGAP 11  --     Recent Labs  Lab 03/03/2022 0845  PROT 7.3  ALBUMIN 3.3*  AST 34  ALT 19  ALKPHOS 68  BILITOT 3.1*   Lipids No results for input(s): "CHOL", "TRIG", "HDL", "LABVLDL", "LDLCALC", "CHOLHDL" in the last 168 hours.  Hematology Recent Labs  Lab 02/25/2022 0845  WBC 0.1*  RBC 2.59*  HGB 7.9*  HCT 23.0*  MCV 88.8  MCH 30.5  MCHC 34.3  RDW 20.8*  PLT 7*   Thyroid No results for input(s): "TSH", "FREET4" in the last 168 hours.  BNP Recent Labs  Lab 02/08/2022 0902  BNP 370.0*    DDimer No results for input(s): "DDIMER" in the last 168 hours.   Radiology/Studies:  CT Head Wo Contrast  Result Date: 02/25/2022 CLINICAL DATA:  Trauma, fall EXAM: CT HEAD WITHOUT CONTRAST TECHNIQUE: Contiguous axial images were obtained from the base of the skull through the vertex without intravenous contrast. RADIATION DOSE REDUCTION: This exam was performed according to the departmental dose-optimization program which includes automated exposure control, adjustment of the mA and/or kV according to Kaitlyn Hull size and/or use of iterative reconstruction technique. COMPARISON:  None Available. FINDINGS: Brain: No acute intracranial findings are seen. There are no signs of bleeding within the cranium. Ventricles are not dilated. Cortical sulci are prominent. There is no focal edema or mass effect. Vascular: Unremarkable. Skull: Unremarkable. Sinuses/Orbits: Unremarkable. Other: None. IMPRESSION: No acute intracranial findings are seen in noncontrast CT brain. Electronically Signed   By: Elmer Picker M.D.   On: 02/04/2022 10:58   DG Chest  Port 1 View  Result Date: 02/10/2022 CLINICAL DATA:  Tachycardia and weakness EXAM: PORTABLE CHEST 1 VIEW COMPARISON:  12/07/2021 and prior studies FINDINGS: This is a low volume study. Cardiomegaly and LEFT PICC line with tip overlying the RIGHT atrium again noted. Defibrillator/pacing pads overlying the chest are noted. There is no evidence of focal airspace disease, pulmonary edema, suspicious pulmonary nodule/mass, pleural effusion, or pneumothorax. No acute bony abnormalities are identified. IMPRESSION: Cardiomegaly without evidence of acute cardiopulmonary disease. Electronically Signed   By: Margarette Canada M.D.   On: 02/10/2022 09:06     Assessment and Plan:   Permanent Atrial Fibrillation  - Kaitlyn Hull was first diagnosed with atrial fibrillation in 2015. Has been in afib permanently since at least 2020. Rates are often well controlled at home  - Kaitlyn Hull taken off eliquis in 12/2021 due to severe anemia, thrombocytopenia. Reports that she was told to resume taking eliquis 2.5 mg BID, but cannot recall which doctor told her so.  For now, hold eliquis given hemoglobin 7.9, platelets 7  - Per telemetry, HR remains elevated to the 140s-170s. However, BP  stable, last recorded 158/119. Since BP is stable, we will plan for rate control for now  - Continue IV diltiazem  - Start metoprolol 25 mg BID  - If BP drops, plan to start IV amiodarone.  As Kaitlyn Hull is in permanent atrial fibrillation and has been for several years, it is very unlikely that she would convert to NSR on amio. - Suspect that HR is elevated in part due to fever. Workup of fever per primary. Kaitlyn Hull also did not take medications yesterday due to weakness, fatigue, so she missed doses of her home rate controlling medications.  - Ordered echocardiogram-- most recent was 07/2021. Kaitlyn Hull has been weak and fatigued for over a week, concern she may have been in RVR for few days. Want to reassess EF   Neutropenic fever - Temp 103.12F on arrival  to [facility]  - WBC 0.1, neutrophils 0 - Workup per primary. Kaitlyn Hull has been started on IV antibiotics   AML Pancytopenia  Severe thrombocytopenia - Agree with Hematology consult    Risk Assessment/Risk Scores:   CHA2DS2-VASc Score = 4  This indicates a 4.8% annual risk of stroke. The Kaitlyn Hull's score is based upon: CHF History: 0 HTN History: 1 Diabetes History: 1 Stroke History: 0 Vascular Disease History: 0 Age Score: 1 Gender Score: 1    For questions or updates, please contact [place] Please consult [email or web address] for contact info under    Signed, Margie Billet, PA-C  02/07/2022 4:51 PM  Kaitlyn Hull seen and examined.  Agree with above documentation.  Ms. Mabee is a 66 year old female with a history of chronic atrial fibrillation, T2DM, hypertension, morbid obesity, AML who we are consulted by Dr. Darl Householder for evaluation of atrial fibrillation.  She presented to [facility] ED today after a fall.  She reports she had been having weakness and fatigue.  On EMS arrival found to have heart rate 190.  In ED, initial vital signs notable for BP 89/60, pulse 159, SpO2 100% on room air.  Labs notable for sodium 130, creatinine 1.61 (increased from 1.0), WBC 0.1, hemoglobin 7.9, platelets 7, lactate 2.4, BNP 370.  Chest x-ray shows cardiomegaly without evidence of acute cardiopulmonary disease.  EKG showed atrial fibrillation, rate 204, diffuse ST depressions.  On exam, Kaitlyn Hull is alert and oriented, irregular, tachycardic, no murmurs, lungs CTAB, no LE edema.  For her atrial fibrillation with RVR, she has known chronic A-fib and rates are likely elevated in the setting of her presentation with neutropenic fever.  Management will be rate control and treatment of sepsis.  She is currently on diltiazem drip but rates remain elevated, will add p.o. metoprolol.  Not a candidate for anticoagulation given severe thrombocytopenia.  Donato Heinz, MD

## 2022-02-17 NOTE — ED Notes (Signed)
Patient transported to CT 

## 2022-02-18 ENCOUNTER — Inpatient Hospital Stay (HOSPITAL_COMMUNITY): Payer: PPO

## 2022-02-18 DIAGNOSIS — B9561 Methicillin susceptible Staphylococcus aureus infection as the cause of diseases classified elsewhere: Secondary | ICD-10-CM | POA: Insufficient documentation

## 2022-02-18 DIAGNOSIS — R7881 Bacteremia: Secondary | ICD-10-CM | POA: Insufficient documentation

## 2022-02-18 DIAGNOSIS — A419 Sepsis, unspecified organism: Secondary | ICD-10-CM

## 2022-02-18 DIAGNOSIS — I4891 Unspecified atrial fibrillation: Secondary | ICD-10-CM

## 2022-02-18 LAB — BLOOD CULTURE ID PANEL (REFLEXED) - BCID2

## 2022-02-18 LAB — CBC WITH DIFFERENTIAL/PLATELET
HCT: 19.9 % — ABNORMAL LOW (ref 36.0–46.0)
Hemoglobin: 6.6 g/dL — CL (ref 12.0–15.0)
MCH: 30.1 pg (ref 26.0–34.0)
MCHC: 33.2 g/dL (ref 30.0–36.0)
MCV: 90.9 fL (ref 80.0–100.0)
Platelets: 17 10*3/uL — CL (ref 150–400)
RBC: 2.19 MIL/uL — ABNORMAL LOW (ref 3.87–5.11)
RDW: 21.2 % — ABNORMAL HIGH (ref 11.5–15.5)
WBC: <0.1 10*3/uL — CL (ref 4.0–10.5)
nRBC: 0 % (ref 0.0–0.2)

## 2022-02-18 LAB — GLUCOSE, CAPILLARY
Glucose-Capillary: 132 mg/dL — ABNORMAL HIGH (ref 70–99)
Glucose-Capillary: 138 mg/dL — ABNORMAL HIGH (ref 70–99)
Glucose-Capillary: 133 mg/dL — ABNORMAL HIGH (ref 70–99)
Glucose-Capillary: 97 mg/dL (ref 70–99)

## 2022-02-18 LAB — BASIC METABOLIC PANEL
Anion gap: 12 (ref 5–15)
BUN: 24 mg/dL — ABNORMAL HIGH (ref 8–23)
CO2: 16 mmol/L — ABNORMAL LOW (ref 22–32)
Calcium: 8.3 mg/dL — ABNORMAL LOW (ref 8.9–10.3)
Chloride: 108 mmol/L (ref 98–111)
Creatinine, Ser: 1.36 mg/dL — ABNORMAL HIGH (ref 0.44–1.00)
GFR, Estimated: 43 mL/min — ABNORMAL LOW (ref >60)
Glucose, Bld: 172 mg/dL — ABNORMAL HIGH (ref 70–99)
Potassium: 5.7 mmol/L — ABNORMAL HIGH (ref 3.5–5.1)
Sodium: 136 mmol/L (ref 135–145)

## 2022-02-18 LAB — ECHOCARDIOGRAM COMPLETE
Area-P 1/2: 9.6 cm2
Height: 62 in
MV M vel: 3.57 m/s
MV Peak grad: 50.8 mmHg
S' Lateral: 3.2 cm
Weight: 3977.6 [oz_av]

## 2022-02-18 LAB — POTASSIUM: Potassium: 4.1 mmol/L (ref 3.5–5.1)

## 2022-02-18 LAB — URINALYSIS, ROUTINE W REFLEX MICROSCOPIC
Bilirubin Urine: NEGATIVE
Glucose, UA: NEGATIVE mg/dL
Ketones, ur: NEGATIVE mg/dL
Leukocytes,Ua: NEGATIVE
Nitrite: NEGATIVE
Protein, ur: 100 mg/dL — AB
Specific Gravity, Urine: 1.029 (ref 1.005–1.030)
pH: 5 (ref 5.0–8.0)

## 2022-02-18 LAB — RESPIRATORY PANEL BY PCR

## 2022-02-18 LAB — MAGNESIUM: Magnesium: 1.7 mg/dL (ref 1.7–2.4)

## 2022-02-18 LAB — PREPARE RBC (CROSSMATCH)

## 2022-02-18 MED ORDER — FUROSEMIDE 10 MG/ML IJ SOLN
20.0000 mg | Freq: Once | INTRAMUSCULAR | Status: DC
  Filled 2022-02-18: qty 2

## 2022-02-18 MED ORDER — SODIUM CHLORIDE 0.9 % IV BOLUS
500.0000 mL | Freq: Once | INTRAVENOUS | Status: AC
  Administered 2022-02-18: 500 mL via INTRAVENOUS

## 2022-02-18 MED ORDER — ACETAMINOPHEN 500 MG PO TABS: 500.0000 mg | ORAL_TABLET | Freq: Four times a day (QID) | ORAL | Status: DC | PRN

## 2022-02-18 MED ORDER — SODIUM CHLORIDE 0.9 % IV SOLN: INTRAVENOUS | Status: DC

## 2022-02-18 MED ORDER — ONDANSETRON HCL 4 MG/2ML IJ SOLN: 4.0000 mg | Freq: Four times a day (QID) | INTRAMUSCULAR | Status: DC | PRN

## 2022-02-18 MED ORDER — ACETAMINOPHEN 325 MG PO TABS: 650.0000 mg | ORAL_TABLET | Freq: Once | ORAL | Status: DC

## 2022-02-18 MED ORDER — DIPHENHYDRAMINE HCL 50 MG/ML IJ SOLN
12.5000 mg | Freq: Once | INTRAMUSCULAR | Status: AC
  Administered 2022-02-18: 12.5 mg via INTRAVENOUS
  Filled 2022-02-18: qty 1

## 2022-02-18 MED ORDER — LACTATED RINGERS IV SOLN: INTRAVENOUS | Status: DC

## 2022-02-18 MED ORDER — TBO-FILGRASTIM 480 MCG/0.8ML ~~LOC~~ SOSY
480.0000 ug | PREFILLED_SYRINGE | Freq: Once | SUBCUTANEOUS | Status: AC
  Administered 2022-02-18: 480 ug via SUBCUTANEOUS
  Filled 2022-02-18 (×2): qty 0.8

## 2022-02-18 MED ORDER — ALBUTEROL SULFATE (2.5 MG/3ML) 0.083% IN NEBU: 2.5000 mg | INHALATION_SOLUTION | RESPIRATORY_TRACT | Status: DC | PRN

## 2022-02-18 MED ORDER — AMIODARONE HCL IN DEXTROSE 360-4.14 MG/200ML-% IV SOLN
60.0000 mg/h | INTRAVENOUS | Status: DC
  Administered 2022-02-18 – 2022-02-22 (×8): 30 mg/h via INTRAVENOUS
  Administered 2022-02-22: 60 mg/h via INTRAVENOUS
  Administered 2022-02-22: 30 mg/h via INTRAVENOUS
  Administered 2022-02-23 (×2): 60 mg/h via INTRAVENOUS
  Filled 2022-02-18 (×12): qty 200

## 2022-02-18 MED ORDER — SODIUM CHLORIDE 0.9% IV SOLUTION: Freq: Once | INTRAVENOUS | Status: AC

## 2022-02-18 MED ORDER — METOPROLOL TARTRATE 25 MG PO TABS
25.0000 mg | ORAL_TABLET | Freq: Two times a day (BID) | ORAL | Status: DC
  Administered 2022-02-18: 25 mg via ORAL
  Filled 2022-02-18 (×2): qty 1

## 2022-02-18 MED ORDER — METFORMIN HCL ER 500 MG PO TB24
1000.0000 mg | ORAL_TABLET | Freq: Every day | ORAL | Status: DC
  Administered 2022-02-18 – 2022-02-20 (×3): 1000 mg via ORAL
  Filled 2022-02-18 (×3): qty 2

## 2022-02-18 MED ORDER — ALPRAZOLAM 0.5 MG PO TABS
0.5000 mg | ORAL_TABLET | Freq: Two times a day (BID) | ORAL | Status: DC | PRN
  Administered 2022-02-21: 0.5 mg via ORAL
  Filled 2022-02-18: qty 1

## 2022-02-18 MED ORDER — METRONIDAZOLE 500 MG PO TABS
500.0000 mg | ORAL_TABLET | Freq: Three times a day (TID) | ORAL | Status: DC
  Administered 2022-02-18: 500 mg via ORAL
  Filled 2022-02-18: qty 1

## 2022-02-18 MED ORDER — ONDANSETRON HCL 4 MG PO TABS: 4.0000 mg | ORAL_TABLET | Freq: Four times a day (QID) | ORAL | Status: DC | PRN

## 2022-02-18 MED ORDER — FUROSEMIDE 10 MG/ML IJ SOLN: 40.0000 mg | Freq: Once | INTRAMUSCULAR | Status: DC | PRN

## 2022-02-18 MED ORDER — PRAVASTATIN SODIUM 40 MG PO TABS: 40.0000 mg | ORAL_TABLET | Freq: Every day | ORAL | Status: DC

## 2022-02-18 MED ORDER — METOPROLOL TARTRATE 5 MG/5ML IV SOLN
5.0000 mg | INTRAVENOUS | Status: DC | PRN
  Administered 2022-02-18 – 2022-02-19 (×2): 5 mg via INTRAVENOUS
  Filled 2022-02-18 (×3): qty 5

## 2022-02-18 MED ORDER — AMIODARONE HCL IN DEXTROSE 360-4.14 MG/200ML-% IV SOLN
60.0000 mg/h | INTRAVENOUS | Status: AC
  Administered 2022-02-18 (×2): 60 mg/h via INTRAVENOUS
  Filled 2022-02-18 (×2): qty 200

## 2022-02-18 MED ORDER — SODIUM CHLORIDE 0.9% IV SOLUTION: Freq: Once | INTRAVENOUS | Status: DC

## 2022-02-18 MED ORDER — METFORMIN HCL ER 500 MG PO TB24: 500.0000 mg | ORAL_TABLET | ORAL | Status: DC

## 2022-02-18 MED ORDER — CHLORHEXIDINE GLUCONATE CLOTH 2 % EX PADS
6.0000 | MEDICATED_PAD | Freq: Every day | CUTANEOUS | Status: DC
  Administered 2022-02-18 – 2022-02-19 (×2): 6 via TOPICAL

## 2022-02-18 MED ORDER — CEFAZOLIN SODIUM-DEXTROSE 2-4 GM/100ML-% IV SOLN
2.0000 g | Freq: Three times a day (TID) | INTRAVENOUS | Status: DC
  Administered 2022-02-18 – 2022-02-24 (×17): 2 g via INTRAVENOUS
  Filled 2022-02-18 (×21): qty 100

## 2022-02-18 MED ORDER — METOPROLOL TARTRATE 25 MG PO TABS
25.0000 mg | ORAL_TABLET | Freq: Two times a day (BID) | ORAL | Status: DC
  Filled 2022-02-18: qty 1

## 2022-02-18 MED ORDER — AMIODARONE LOAD VIA INFUSION
150.0000 mg | Freq: Once | INTRAVENOUS | Status: AC
  Administered 2022-02-18: 150 mg via INTRAVENOUS
  Filled 2022-02-18: qty 83.34

## 2022-02-18 MED ORDER — MAGNESIUM OXIDE -MG SUPPLEMENT 400 (240 MG) MG PO TABS
400.0000 mg | ORAL_TABLET | Freq: Two times a day (BID) | ORAL | Status: DC
  Administered 2022-02-18 – 2022-02-23 (×11): 400 mg via ORAL
  Filled 2022-02-18 (×12): qty 1

## 2022-02-18 NOTE — Consult Note (Addendum)
[place] for Infectious Disease       Reason for Consult: Bacteremia    Referring Physician: Dr. Florene Glen  Principal Problem:   Atrial fibrillation with tachycardic ventricular rate ([place]) Active Problems:   Morbid obesity due to excess calories (HCC)   Chronic atrial fibrillation (HCC)   Acute Myeloid Leukemia/Abnormal blood smear/   Neutropenia (HCC)   Acute myeloid leukemia in adult [facility])   Neutropenic fever (HCC)    sodium chloride   Intravenous Once   sodium chloride   Intravenous Once   acetaminophen  650 mg Oral Once   Chlorhexidine Gluconate Cloth  6 each Topical Daily   insulin aspart  0-15 Units Subcutaneous TID WC   magnesium oxide  400 mg Oral BID   metFORMIN  1,000 mg Oral Q supper   metFORMIN  500 mg Oral Q breakfast   metoprolol tartrate  25 mg Oral BID   metroNIDAZOLE  500 mg Oral Q8H   pravastatin  40 mg Oral q1800   Tbo-Filgrastim  480 mcg Subcutaneous Once    Recommendations: Start Cefazolin Will stop other antibiotics Line holiday  Assessment: Pancytopenia and fever with Staph aureus bacteremia in the setting of a picc line with cellulitis.   HPI: Kaitlyn Hull is a 66 y.o. female with a history of breast cancer s/p radiation and now with AML s/p cycle one of decitabine in September here with weakness, high fever and now positive blood cultures with Staph aureus in 4/4 bottles.  She has pancytopenia at baseline and worsened in the acute setting.  She has a picc line for her chemotherapy that was placed in September.  WBC < 0.1, Tmax 103.2.  no new back pain, no hardware.     Review of Systems:  Constitutional: positive for fevers and chills Gastrointestinal: negative for diarrhea All other systems reviewed and are negative    Past Medical History:  Diagnosis Date   Atrial fibrillation with RVR ([place]) 06/2013   Breast cancer ([place]) 2007   left   Breast disorder    cancer   Diabetes mellitus    Type II   Dysrhythmia    Hematuria  01/20/2014   Hypertension    Hyperthyroidism 06/2013   Leukemia ([place])    Obesity    PMB (postmenopausal bleeding) 07/24/2012   Had 16.1 mm endometrium on [place] will get endo biopsy   Psoriasis    Urinary frequency 01/20/2014    Social History   Tobacco Use   Smoking status: Former    Packs/day: 0.01    Years: 1.00    Total pack years: 0.01    Types: Cigarettes    Start date: 03/05/1976    Quit date: 03/06/2003    Years since quitting: 18.9   Smokeless tobacco: Never  Vaping Use   Vaping Use: Never used  Substance Use Topics   Alcohol use: No    Alcohol/week: 0.0 standard drinks of alcohol   Drug use: No    Family History  Problem Relation Age of Onset   Heart failure Father    CAD Father    Diabetes Sister    Other Sister        blood clots   Breast cancer Sister    Alzheimer's disease Mother    Diabetes Sister    Hypertension Maternal Grandmother    Colon cancer Neg Hx     No Known Allergies  Physical Exam: Constitutional: in no apparent distress  Vitals:   02/18/22 1018  02/18/22 1115  BP: 96/77 114/69  Pulse:  68  Resp:  (!) 21  Temp: 98.7 F (37.1 C) 98.1 F (36.7 C)  SpO2:  100%   EYES: anicteric Respiratory: normal respiratory effort Musculoskeletal: no edema  Lab Results  Component Value Date   WBC <0.1 (LL) 02/18/2022   HGB 6.6 (LL) 02/18/2022   HCT 19.9 (L) 02/18/2022   MCV 90.9 02/18/2022   PLT 17 (LL) 02/18/2022    Lab Results  Component Value Date   CREATININE 1.36 (H) 02/18/2022   BUN 24 (H) 02/18/2022   NA 136 02/18/2022   K 4.1 02/18/2022   CL 108 02/18/2022   CO2 16 (L) 02/18/2022    Lab Results  Component Value Date   ALT 19 02/11/2022   AST 34 03/04/2022   ALKPHOS 68 02/27/2022     Microbiology: Recent Results (from the past 240 hour(s))  Resp panel by RT-PCR (RSV, Flu A&B, Covid) Anterior Nasal Swab     Status: None   Collection Time: 02/15/2022  8:59 AM   Specimen: Anterior Nasal Swab  Result Value Ref Range Status    SARS Coronavirus 2 by RT PCR NEGATIVE NEGATIVE Final    Comment: (NOTE) SARS-CoV-2 target nucleic acids are NOT DETECTED.  The SARS-CoV-2 RNA is generally detectable in upper respiratory specimens during the acute phase of infection. The lowest concentration of SARS-CoV-2 viral copies this assay can detect is 138 copies/mL. A negative result does not preclude SARS-Cov-2 infection and should not be used as the sole basis for treatment or other patient management decisions. A negative result may occur with  improper specimen collection/handling, submission of specimen other than nasopharyngeal swab, presence of viral mutation(s) within the areas targeted by this assay, and inadequate number of viral copies(<138 copies/mL). A negative result must be combined with clinical observations, patient history, and epidemiological information. The expected result is Negative.  Fact Sheet for Patients:  EntrepreneurPulse.com.au  Fact Sheet for Healthcare Providers:  IncredibleEmployment.be  This test is no t yet approved or cleared by the [place] FDA and  has been authorized for detection and/or diagnosis of SARS-CoV-2 by FDA under an Emergency Use Authorization (EUA). This EUA will remain  in effect (meaning this test can be used) for the duration of the COVID-19 declaration under Section 564(b)(1) of the Act, 21 U.S.C.section 360bbb-3(b)(1), unless the authorization is terminated  or revoked sooner.       Influenza A by PCR NEGATIVE NEGATIVE Final   Influenza B by PCR NEGATIVE NEGATIVE Final    Comment: (NOTE) The Xpert Xpress SARS-CoV-2/FLU/RSV plus assay is intended as an aid in the diagnosis of influenza from Nasopharyngeal swab specimens and should not be used as a sole basis for treatment. Nasal washings and aspirates are unacceptable for Xpert Xpress SARS-CoV-2/FLU/RSV testing.  Fact Sheet for  Patients: EntrepreneurPulse.com.au  Fact Sheet for Healthcare Providers: IncredibleEmployment.be  This test is not yet approved or cleared by the [place] FDA and has been authorized for detection and/or diagnosis of SARS-CoV-2 by FDA under an Emergency Use Authorization (EUA). This EUA will remain in effect (meaning this test can be used) for the duration of the COVID-19 declaration under Section 564(b)(1) of the Act, 21 U.S.C. section 360bbb-3(b)(1), unless the authorization is terminated or revoked.     Resp Syncytial Virus by PCR NEGATIVE NEGATIVE Final    Comment: (NOTE) Fact Sheet for Patients: EntrepreneurPulse.com.au  Fact Sheet for Healthcare Providers: IncredibleEmployment.be  This test is not yet approved or  cleared by the [place] and has been authorized for detection and/or diagnosis of SARS-CoV-2 by FDA under an Emergency Use Authorization (EUA). This EUA will remain in effect (meaning this test can be used) for the duration of the COVID-19 declaration under Section 564(b)(1) of the Act, 21 U.S.C. section 360bbb-3(b)(1), unless the authorization is terminated or revoked.  Performed at [facility], [address] [place], [place] [address]   Blood culture (routine x 2)     Status: None (Preliminary result)   Collection Time: 02/07/2022  3:19 PM   Specimen: BLOOD  Result Value Ref Range Status   Specimen Description BLOOD SITE NOT SPECIFIED  Final   Special Requests   Final    BOTTLES DRAWN AEROBIC AND ANAEROBIC Blood Culture results may not be optimal due to an inadequate volume of blood received in culture bottles   Culture  Setup Time   Final    GRAM POSITIVE COCCI IN BOTH AEROBIC AND ANAEROBIC BOTTLES CRITICAL VALUE NOTED.  VALUE IS CONSISTENT WITH PREVIOUSLY REPORTED AND CALLED VALUE. Performed at [facility], [place] [address], [place], [place] [address]    Culture  GRAM POSITIVE COCCI  Final   Report Status PENDING  Incomplete  Blood culture (routine x 2)     Status: None (Preliminary result)   Collection Time: 02/16/2022  3:24 PM   Specimen: BLOOD  Result Value Ref Range Status   Specimen Description BLOOD SITE NOT SPECIFIED  Final   Special Requests   Final    BOTTLES DRAWN AEROBIC AND ANAEROBIC Blood Culture adequate volume   Culture  Setup Time   Final    GRAM POSITIVE COCCI IN PAIRS IN CLUSTERS IN BOTH AEROBIC AND ANAEROBIC BOTTLES CRITICAL RESULT CALLED TO, READ BACK BY AND VERIFIED WITH: PHARMD [email or web address] AT 1448 ON 02/18/2022 BY T.SAAD. Performed at [facility], [place] [address], [place], [place] [address]    Culture GRAM POSITIVE COCCI  Final   Report Status PENDING  Incomplete  Blood Culture ID Panel (Reflexed)     Status: Abnormal   Collection Time: 02/27/2022  3:24 PM  Result Value Ref Range Status   Enterococcus faecalis NOT DETECTED NOT DETECTED Final   Enterococcus Faecium NOT DETECTED NOT DETECTED Final   Listeria monocytogenes NOT DETECTED NOT DETECTED Final   Staphylococcus species DETECTED (A) NOT DETECTED Final    Comment: CRITICAL RESULT CALLED TO, READ BACK BY AND VERIFIED WITH: PHARMD [email or web address] AT 1497 ON 02/18/2022 BY T.SAAD.    Staphylococcus aureus (BCID) DETECTED (A) NOT DETECTED Final    Comment: CRITICAL RESULT CALLED TO, READ BACK BY AND VERIFIED WITH: PHARMD [email or web address] AT 0263 ON 02/18/2022 BY T.SAAD.    Staphylococcus epidermidis NOT DETECTED NOT DETECTED Final   Staphylococcus lugdunensis NOT DETECTED NOT DETECTED Final   Streptococcus species NOT DETECTED NOT DETECTED Final   Streptococcus agalactiae NOT DETECTED NOT DETECTED Final   Streptococcus pneumoniae NOT DETECTED NOT DETECTED Final   Streptococcus pyogenes NOT DETECTED NOT DETECTED Final   A.calcoaceticus-baumannii NOT DETECTED NOT DETECTED Final   Bacteroides fragilis NOT DETECTED NOT DETECTED Final   Enterobacterales NOT DETECTED NOT DETECTED Final    Enterobacter cloacae complex NOT DETECTED NOT DETECTED Final   Escherichia coli NOT DETECTED NOT DETECTED Final   Klebsiella aerogenes NOT DETECTED NOT DETECTED Final   Klebsiella oxytoca NOT DETECTED NOT DETECTED Final   Klebsiella pneumoniae NOT DETECTED NOT DETECTED Final   Proteus species NOT DETECTED NOT DETECTED Final  Salmonella species NOT DETECTED NOT DETECTED Final   Serratia marcescens NOT DETECTED NOT DETECTED Final   Haemophilus influenzae NOT DETECTED NOT DETECTED Final   Neisseria meningitidis NOT DETECTED NOT DETECTED Final   Pseudomonas aeruginosa NOT DETECTED NOT DETECTED Final   Stenotrophomonas maltophilia NOT DETECTED NOT DETECTED Final   Candida albicans NOT DETECTED NOT DETECTED Final   Candida auris NOT DETECTED NOT DETECTED Final   Candida glabrata NOT DETECTED NOT DETECTED Final   Candida krusei NOT DETECTED NOT DETECTED Final   Candida parapsilosis NOT DETECTED NOT DETECTED Final   Candida tropicalis NOT DETECTED NOT DETECTED Final   Cryptococcus neoformans/gattii NOT DETECTED NOT DETECTED Final   Meth resistant mecA/C and MREJ NOT DETECTED NOT DETECTED Final    Comment: Performed at [facility], [address], [place], [place] [address]  Culture, body fluid w Gram Stain-bottle     Status: None (Preliminary result)   Collection Time: 02/16/2022  6:25 PM   Specimen: Fluid  Result Value Ref Range Status   Specimen Description FLUID  Final   Special Requests   Final    BOTTLES DRAWN AEROBIC AND ANAEROBIC Blood Culture results may not be optimal due to an excessive volume of blood received in culture bottles   Culture   Final    NO GROWTH < 12 HOURS Performed at [facility], [place] [address], [place], [place] [address]    Report Status PENDING  Incomplete  Gram stain     Status: None   Collection Time: 02/25/2022  6:26 PM   Specimen: Fluid  Result Value Ref Range Status   Specimen Description FLUID  Final   Special Requests NONE  Final   Gram  Stain   Final    NO ORGANISMS SEEN NO WBC SEEN Performed at [facility], [place] [address] [place], [place] [address]    Report Status 03/02/2022 FINAL  Final    Thayer Headings, [place] for Infectious Disease [facility] [email or web address] 02/18/2022, 12:22 PM

## 2022-02-18 NOTE — Progress Notes (Addendum)
By nursing, patient's heart rate sustaining in the 140s despite Cardizem being maximized.  Metoprolol 5 mg IV x 1 given.  Heart rate decreased to 121.  Follow blood pressure systolic blood pressure in the 120s.  Patient afebrile  Called by nursing, blood pressures dropped into the 101/65, maintaining borderline blood pressures.  Heart rate remained in the 120s to 130s.  Will convert to amiodarone, per cardiologist note

## 2022-02-18 NOTE — Progress Notes (Signed)
Rounding Note    Patient Name: Kaitlyn Hull Date of Encounter: 02/18/2022  [place] Cardiologist: Carlyle Dolly, MD   Subjective   BP 91/57.  Blood cultures growing staph.  She is somnolent but arousable, denies any chest pain or dyspnea  Inpatient Medications    Scheduled Meds:  sodium chloride   Intravenous Once   sodium chloride   Intravenous Once   acetaminophen  650 mg Oral Once   Chlorhexidine Gluconate Cloth  6 each Topical Daily   insulin aspart  0-15 Units Subcutaneous TID WC   magnesium oxide  400 mg Oral BID   metFORMIN  1,000 mg Oral Q supper   metFORMIN  500 mg Oral Q breakfast   metoprolol tartrate  25 mg Oral BID   pravastatin  40 mg Oral q1800   Continuous Infusions:  amiodarone 30 mg/hr (02/18/22 1404)    ceFAZolin (ANCEF) IV 2 g (02/18/22 1532)   lactated ringers     PRN Meds: acetaminophen **OR** acetaminophen, albuterol, ALPRAZolam, metoprolol tartrate, ondansetron **OR** ondansetron (ZOFRAN) IV   Vital Signs    Vitals:   02/18/22 1115 02/18/22 1230 02/18/22 1255 02/18/22 1300  BP: 114/69 (!) 115/99  (!) 103/37  Pulse: 68 (!) 117 (!) 121   Resp: (!) 21  (!) 36 (!) 31  Temp: 98.1 F (36.7 C) 98.3 F (36.8 C) 97.9 F (36.6 C)   TempSrc: Oral Oral Oral   SpO2: 100% 98% 100%   Weight:      Height:        Intake/Output Summary (Last 24 hours) at 02/18/2022 1537 Last data filed at 02/18/2022 1535 Gross per 24 hour  Intake 3039.34 ml  Output --  Net 3039.34 ml      02/14/2022   11:56 PM 02/16/2022    8:42 AM 02/05/2022    1:39 PM  Last 3 Weights  Weight (lbs) 248 lb 9.6 oz 258 lb 257 lb 12.8 oz  Weight (kg) 112.764 kg 117.028 kg 116.937 kg      Telemetry    A-fib, currently 110s to 120s- Personally Reviewed  ECG    No new ECG- Personally Reviewed  Physical Exam   GEN: No acute distress.   Neck: No JVD Cardiac: Irregular, tachycardic no murmurs, rubs, or gallops.  Respiratory: Clear to auscultation  bilaterally. GI: Soft, nontender, non-distended  MS: No edema; No deformity. Neuro: Somnolent but arousable Psych: Normal affect   Labs    High Sensitivity Troponin:  No results for input(s): "TROPONINIHS" in the last 720 hours.   Chemistry Recent Labs  Lab 02/19/2022 0845 02/12/2022 0902 02/18/22 0645 02/18/22 0646 02/18/22 1035  NA 130*  --  136  --   --   K 3.8  --  5.7*  --  4.1  CL 98  --  108  --   --   CO2 21*  --  16*  --   --   GLUCOSE 223*  --  172*  --   --   BUN 27*  --  24*  --   --   CREATININE 1.61*  --  1.36*  --   --   CALCIUM 8.9  --  8.3*  --   --   MG  --  0.7*  --  1.7  --   PROT 7.3  --   --   --   --   ALBUMIN 3.3*  --   --   --   --  AST 34  --   --   --   --   ALT 19  --   --   --   --   ALKPHOS 68  --   --   --   --   BILITOT 3.1*  --   --   --   --   GFRNONAA 35*  --  43*  --   --   ANIONGAP 11  --  12  --   --     Lipids No results for input(s): "CHOL", "TRIG", "HDL", "LABVLDL", "LDLCALC", "CHOLHDL" in the last 168 hours.  Hematology Recent Labs  Lab 02/05/2022 0845 02/18/22 0430 02/18/22 1035  WBC 0.1* DUPL <0.1*  RBC 2.59* DUPL 2.19*  HGB 7.9* DUPL 6.6*  HCT 23.0* DUPL 19.9*  MCV 88.8 DUPL 90.9  MCH 30.5 DUPL 30.1  MCHC 34.3 DUPL 33.2  RDW 20.8* DUPL 21.2*  PLT 7* DUPL 17*   Thyroid No results for input(s): "TSH", "FREET4" in the last 168 hours.  BNP Recent Labs  Lab 02/16/2022 0902  BNP 370.0*    DDimer No results for input(s): "DDIMER" in the last 168 hours.   Radiology    CT Head Wo Contrast  Result Date: 03/04/2022 CLINICAL DATA:  Trauma, fall EXAM: CT HEAD WITHOUT CONTRAST TECHNIQUE: Contiguous axial images were obtained from the base of the skull through the vertex without intravenous contrast. RADIATION DOSE REDUCTION: This exam was performed according to the departmental dose-optimization program which includes automated exposure control, adjustment of the mA and/or kV according to patient size and/or use of iterative  reconstruction technique. COMPARISON:  None Available. FINDINGS: Brain: No acute intracranial findings are seen. There are no signs of bleeding within the cranium. Ventricles are not dilated. Cortical sulci are prominent. There is no focal edema or mass effect. Vascular: Unremarkable. Skull: Unremarkable. Sinuses/Orbits: Unremarkable. Other: None. IMPRESSION: No acute intracranial findings are seen in noncontrast CT brain. Electronically Signed   By: Elmer Picker M.D.   On: 02/15/2022 10:58   DG Chest Port 1 View  Result Date: 02/18/2022 CLINICAL DATA:  Tachycardia and weakness EXAM: PORTABLE CHEST 1 VIEW COMPARISON:  12/07/2021 and prior studies FINDINGS: This is a low volume study. Cardiomegaly and LEFT PICC line with tip overlying the RIGHT atrium again noted. Defibrillator/pacing pads overlying the chest are noted. There is no evidence of focal airspace disease, pulmonary edema, suspicious pulmonary nodule/mass, pleural effusion, or pneumothorax. No acute bony abnormalities are identified. IMPRESSION: Cardiomegaly without evidence of acute cardiopulmonary disease. Electronically Signed   By: Margarette Canada M.D.   On: 02/25/2022 09:06    Cardiac Studies     Patient Profile     66 y.o. female with a hx of chronic atrial fibrillation, type 2 DM, HTN, AML, morbid obesity who is being seen 02/26/2022 for the evaluation of atrial fibrillation    Assessment & Plan    Permanent atrial fibrillation: Has known chronic A-fib, presents with A-fib with RVR in setting of her acute infection.   -She is not a candidate for anticoagulation given severe anemia/thrombocytopenia -Plan will be for rate control, suspect will improve with treatment of her sepsis -She was started on IV amiodarone overnight.  Given chronic A-fib, there is very low risk of chemical cardioversion and that should be fine to use amiodarone despite not being on anticoagulation.  Would also continue p.o. metoprolol as BP  tolerates  Sepsis: Presented with fever, neutropenia.  Found to have staph bacteremia. -Check TTE -Will  likely need TEE given staph bacteremia, but not currently a candidate given severe thrombocytopenia.  Need platelet count greater than 50 for TEE  For questions or updates, please contact [place] Please consult [email or web address] for contact info under        Signed, Donato Heinz, MD  02/18/2022, 3:37 PM

## 2022-02-18 NOTE — Progress Notes (Signed)
Patient has been febrile majority of the night. Last rectal temperature is 101.1 Kaitlyn Sis MD consult about continuing with the administration of platelets due to patient febrile state. Crosley MD consulted Hematology. MD advised to continue with the administration of platelets after Tylenol and Benadryl is given. Will monitor patient for other signs and symptoms of blood product reactions during administration.

## 2022-02-18 NOTE — Progress Notes (Signed)
Patient was not given Platelets. Patient was schedule to start administration at shift change. With fever, patient should receive platelets after shift change to receive closer monitoring.

## 2022-02-18 NOTE — Progress Notes (Signed)
  Echocardiogram 2D Echocardiogram has been performed.  Wynelle Link 02/18/2022, 3:30 PM

## 2022-02-18 NOTE — Progress Notes (Addendum)
PHARMACY - PHYSICIAN COMMUNICATION CRITICAL VALUE ALERT - BLOOD CULTURE IDENTIFICATION (BCID)  Kaitlyn Hull is an 66 y.o. female who presented to [facility] on 02/20/2022 with febrile neutropenia  Assessment:  Patient presented with febrile neutropenia on 12/16 with fever of 103.2 in afib RVR with worsening pancytopenia. On arrival, noted that she had left facial cellulitis with small tender swelling along mandibular area and recent history of left external otitis which she was given and finished antibiotics. BCID came back and showed 4/4 staph areus with no resistance gene detected. Cr down rending from 1.61 to 1.36, HR elevated 147, last fever at 0600 of 101.1, and severe pancytopenia   Name of physician (or Provider) Contacted: Dr. Florene Glen  Current antibiotics: Cefepime 2g q12, vancomycin '1500mg'$  q48h, and flagyl 500 mg q8h  Changes to prescribed antibiotics recommended:   Given current condition, provider did not want to de-escalate therapy quite yet. He will be consulting ID to manage patient's antibiotics and further de-escalation.  Results for orders placed or performed during the hospital encounter of 02/25/2022  Blood Culture ID Panel (Reflexed) (Collected: 02/10/2022  3:24 PM)  Result Value Ref Range   Enterococcus faecalis NOT DETECTED NOT DETECTED   Enterococcus Faecium NOT DETECTED NOT DETECTED   Listeria monocytogenes NOT DETECTED NOT DETECTED   Staphylococcus species DETECTED (A) NOT DETECTED   Staphylococcus aureus (BCID) DETECTED (A) NOT DETECTED   Staphylococcus epidermidis NOT DETECTED NOT DETECTED   Staphylococcus lugdunensis NOT DETECTED NOT DETECTED   Streptococcus species NOT DETECTED NOT DETECTED   Streptococcus agalactiae NOT DETECTED NOT DETECTED   Streptococcus pneumoniae NOT DETECTED NOT DETECTED   Streptococcus pyogenes NOT DETECTED NOT DETECTED   A.calcoaceticus-baumannii NOT DETECTED NOT DETECTED   Bacteroides fragilis NOT DETECTED NOT DETECTED    Enterobacterales NOT DETECTED NOT DETECTED   Enterobacter cloacae complex NOT DETECTED NOT DETECTED   Escherichia coli NOT DETECTED NOT DETECTED   Klebsiella aerogenes NOT DETECTED NOT DETECTED   Klebsiella oxytoca NOT DETECTED NOT DETECTED   Klebsiella pneumoniae NOT DETECTED NOT DETECTED   Proteus species NOT DETECTED NOT DETECTED   Salmonella species NOT DETECTED NOT DETECTED   Serratia marcescens NOT DETECTED NOT DETECTED   Haemophilus influenzae NOT DETECTED NOT DETECTED   Neisseria meningitidis NOT DETECTED NOT DETECTED   Pseudomonas aeruginosa NOT DETECTED NOT DETECTED   Stenotrophomonas maltophilia NOT DETECTED NOT DETECTED   Candida albicans NOT DETECTED NOT DETECTED   Candida auris NOT DETECTED NOT DETECTED   Candida glabrata NOT DETECTED NOT DETECTED   Candida krusei NOT DETECTED NOT DETECTED   Candida parapsilosis NOT DETECTED NOT DETECTED   Candida tropicalis NOT DETECTED NOT DETECTED   Cryptococcus neoformans/gattii NOT DETECTED NOT DETECTED   Meth resistant mecA/C and MREJ NOT DETECTED NOT Start, PharmD. [facility] PGY-1  02/18/2022 9:50 AM

## 2022-02-18 NOTE — Progress Notes (Addendum)
Pharmacy Antibiotic Note  Kaitlyn Hull is a 66 y.o. female admitted on 02/25/2022 with bacteremia.  Pharmacy has been consulted for cefazolin dosing. Patient has PMH of breast cancer s/p radiation and now with AML s/p cycle one of decitabine in September. She presented here with weakness, high fever and now positive blood cultures with Staph aureus in 4/4 bottles (no resistant detected). Patient has baseline pancytopenia that has worsened in the acute setting. Has a PICC line that was placed in September for her chemotherapy and now is receiving line holiday. Was previously on cefepime/vanc/flagyl. WBC <0.1, Cr trending down from 1.61 to 1.36 (bl~1), Tmax 103.2  Plan: Cefazolin  2g IV every 8 hours  Monitor for signs of clinical improvement, fever curve, renal function, cultures  Height: '5\' 2"'$  (157.5 cm) Weight: 112.8 kg (248 lb 9.6 oz) IBW/kg (Calculated) : 50.1  Temp (24hrs), Avg:100.1 F (37.8 C), Min:97.9 F (36.6 C), Max:103.2 F (39.6 C)  Recent Labs  Lab 02/25/2022 0845 02/16/2022 0846 02/09/2022 1040 02/18/22 0430 02/18/22 0645 02/18/22 1035  WBC 0.1*  --   --  DUPL  --  <0.1*  CREATININE 1.61*  --   --   --  1.36*  --   LATICACIDVEN  --  2.4* 2.0*  --   --   --     Estimated Creatinine Clearance: 49 mL/min (A) (by C-G formula based on SCr of 1.36 mg/dL (H)).    No Known Allergies  Antimicrobials this admission: Cefepime 12/16>>12/17 Vanc 12/16>>12/17 Flagyl 12/17>> 12/17 Cefazolin: 12/17>>  Microbiology results: 12/16 BCID: 4/4 staph aureus (no R genes) 12/16: Bcx: GPC  12/16: Ucx: sent  12/16: resp panel: neg  Thank you for allowing pharmacy to be a part of this patient's care.  Sandford Craze, PharmD. [facility] Acute Care PGY-1  02/18/2022 1:55 PM

## 2022-02-18 NOTE — Progress Notes (Signed)
Dr. Florene Glen updated with critical lab work  : WBC = < 0.1;  HgB = 6.6 and Platelets at 17.  Differential count to follow .

## 2022-02-18 NOTE — Progress Notes (Signed)
Arrived to room for PICC removal, Rey, RN requested I return later. He will enter [facility] consult when ready.

## 2022-02-18 NOTE — Progress Notes (Signed)
   02/18/22 1936  Assess: MEWS Score  Temp 98.2 F (36.8 C)  BP 114/67  MAP (mmHg) 80  Pulse Rate (!) 132  ECG Heart Rate (!) 135  Resp (!) 22  SpO2 100 %  O2 Device Nasal Cannula  O2 Flow Rate (L/min) 2 L/min  Assess: MEWS Score  MEWS Temp 0  MEWS Systolic 0  MEWS Pulse 3  MEWS RR 1  MEWS LOC 0  MEWS Score 4  MEWS Score Color Red  Assess: if the MEWS score is Yellow or Red  Were vital signs taken at a resting state? Yes  Focused Assessment No change from prior assessment  Does the patient meet 2 or more of the SIRS criteria? Yes  Does the patient have a confirmed or suspected source of infection? Yes  Provider and Rapid Response Notified? No (Medical team aware of red MEWs status.)  MEWS guidelines implemented *See Row Information* No, previously red, continue vital signs every 4 hours  Treat  MEWS Interventions Administered scheduled meds/treatments  Pain Scale 0-10  Pain Score 0  Document  Patient Outcome Not stable and remains on department  Progress note created (see row info) Yes  Assess: SIRS CRITERIA  SIRS Temperature  0  SIRS Pulse 1  SIRS Respirations  1  SIRS WBC 1  SIRS Score Sum  3

## 2022-02-18 NOTE — Progress Notes (Signed)
   02/19/2022 2353  Assess: MEWS Score  Temp 99.3 F (37.4 C)  BP (!) 141/83  MAP (mmHg) 102  Pulse Rate (!) 141  Resp (!) 34  Level of Consciousness Alert  SpO2 98 %  O2 Device Nasal Cannula  O2 Flow Rate (L/min) 2 L/min  Assess: MEWS Score  MEWS Temp 0  MEWS Systolic 0  MEWS Pulse 3  MEWS RR 2  MEWS LOC 0  MEWS Score 5  MEWS Score Color Red  Assess: if the MEWS score is Yellow or Red  Were vital signs taken at a resting state? Yes  Treat  Pain Scale 0-10  Pain Score 0  Take Vital Signs  Increase Vital Sign Frequency  Red: Q 1hr X 4 then Q 4hr X 4, if remains red, continue Q 4hrs  Escalate  MEWS: Escalate Red: discuss with charge nurse/RN and provider, consider discussing with RRT  Notify: Charge Nurse/RN  Name of Charge Nurse/RN Notified Raiford Noble RN  Date Charge Nurse/RN Notified 02/16/2022  Time Charge Nurse/RN Notified 2353  Provider Notification  Provider Name/Title Crosley MD  Date Provider Notified 02/18/22  Time Provider Notified 0014  Method of Notification Call  Notification Reason Change in status  Provider response En route  Date of Provider Response 02/18/22  Time of Provider Response 0024  Document  Patient Outcome Not stable and remains on department  Assess: SIRS CRITERIA  SIRS Temperature  0  SIRS Pulse 1  SIRS Respirations  1  SIRS WBC 0  SIRS Score Sum  2

## 2022-02-18 NOTE — Consult Note (Signed)
[place]  Telephone:([phone]) [phone] Fax:([phone]) [place]    Referral MD  Reason for Referral: Neutropenic fever  Chief Complaint  Patient presents with   Weakness  Neutropenic fever  HPI:   This is a very pleasant 66 yr old female patient with AML   Oncology History  Acute myeloid leukemia not having achieved remission ([place])  11/03/2021 Initial Diagnosis  Acute myeloid leukemia not having achieved remission ([place]) in hypercellular bone marrow (80%) with trilineage dysplasia  Cytogenetics positive for +14 and +8  FISH positive for +8 and RUNX1  NGS positive for RUNX1, U2AF1, BCOR, ZRSR2, KRAS, NRAS, DNMT3A  11/14/2021 - Chemotherapy  INPT / OP ONC LEUKEMIA / AML / MDS / VENETOCLAX / DECITABINE (5 or 10 Days with Jump Start Options) Plan Provider: Brantley Fling, MD Treatment goal: Control Line of treatment: First Line  11/14/2021 - Chemotherapy  C1 Decitabine 45m/m2 IV daily x 5 days with Venetoclax 1060mdaily (dose reduced 2/2 posaconazole, dose reduced to 20029maily when off posa due to Car[place]Complications  - acute left upper extremity cephalic vein superficial thrombosis associated with PICC line and left upper extremity cellulitis - treated with supportive care and IV Vanc transitioned to PO doxycycline - Atrial fibrillation - diltiazem held - started metoprolol 83m29mD- anticoagulation held due to thrombocytopenia - diarrhea and hypomagnesemia - GIP and C. Diff negative. Diarrhea resolved with fiber supplementation  12/05/2021 Biopsy  Repeat bmbx c/w markedly hemodilute marrow with mildly increased blasts (4%)  Cytogenetics positive for +8 and +14  FISH positive for t(8;21) and +8  MRD positive in 1.0%3.6%cells  Complications  10/514/4/3154admitted locally due to left ear/face cellulitis, treated with IV abx and completed PO abx course  12/18/2021 - Chemotherapy  C2 Decitabine 20mg45mIV daily x 5  days with Venetoclax 100mg 1my (dose reduced 2/2 posaconazole)  01/09/2022 Biopsy  Repeat bmbx with no morphologic evidence of AML  She is currently being monitored by Dr PowellFlorene Glenr KatragDelton Coombested with feeling of weakness, fevers, left swollen parotid gland.  She denies any new complaints today. She was sitting in chair, complains of chills. No change in breathing. She tells me that Dr K recoRaliegh Ipmended holding venetoclax.  No cough, chest pain, SOB.  No hematochezia or melena  No hematuria or dysuria.  No abdominal pain  She denies any pain at picc site Rest of the pertinent 10 point ROS reviewed and negative.  Past Medical History:  Diagnosis Date   Atrial fibrillation with RVR (HCC) 0[place]015   Breast cancer (HCC) [id number]   left   Breast disorder    cancer   Diabetes mellitus    Type II   Dysrhythmia    Hematuria 01/20/2014   Hypertension    Hyperthyroidism 06/2013   Leukemia (HCC)  [place]    PMB (postmenopausal bleeding) 07/24/2012   Had 16.1 mm endometrium on US wilKoreaget endo biopsy   Psoriasis    Urinary frequency 01/20/2014  :   Past Surgical History:  Procedure Laterality Date   BREAST BIOPSY Left 09/14/2020   Procedure: BREAST BIOPSY;  Surgeon: JenkinAviva Signs Location: [facility] ORS;  Service: General;  Laterality: Left;   BREAST SURGERY  02/20/06   left side    CESAREAN SECTION  1995   COLONOSCOPY WITH PROPOFOL N/A 10/28/2021   Procedure: COLONOSCOPY WITH PROPOFOL;  Surgeon: Rourk,Daneil Dolin Location: [facility]  ENDO SUITE;  Service: Endoscopy;  Laterality: N/A;   ESOPHAGOGASTRODUODENOSCOPY (EGD) WITH PROPOFOL N/A 10/28/2021   Procedure: ESOPHAGOGASTRODUODENOSCOPY (EGD) WITH PROPOFOL;  Surgeon: Daneil Dolin, MD;  Location: [facility] ENDO SUITE;  Service: Endoscopy;  Laterality: N/A;   HYSTEROSCOPY WITH D & C N/A 08/13/2012   Procedure: DILATATION AND CURETTAGE /HYSTEROSCOPY;  Surgeon: Florian Buff, MD;  Location: [facility] ORS;  Service: Gynecology;  Laterality: N/A;    SECONDARY CLOSURE OF WOUND Left 02/20/2021   Procedure: SIMPLE WOUND CLOSURE;  Surgeon: Aviva Signs, MD;  Location: [facility];  Service: General;  Laterality: Left;  :   Current Facility-Administered Medications  Medication Dose Route Frequency Provider Last Rate Last Admin   0.9 %  sodium chloride infusion (Manually program via Guardrails IV Fluids)   Intravenous Once Carmin Muskrat, MD   Held at 03/03/2022 2001   0.9 %  sodium chloride infusion (Manually program via Guardrails IV Fluids)   Intravenous Once Elodia Florence., MD       acetaminophen (TYLENOL) tablet 650 mg  650 mg Oral Q6H PRN Guilford Shi, MD   650 mg at 02/18/22 2409   Or   acetaminophen (TYLENOL) suppository 650 mg  650 mg Rectal Q6H PRN Guilford Shi, MD       acetaminophen (TYLENOL) tablet 650 mg  650 mg Oral Once Crosley, Debby, MD       albuterol (PROVENTIL) (2.5 MG/3ML) 0.083% nebulizer solution 2.5 mg  2.5 mg Nebulization Q2H PRN Guilford Shi, MD       ALPRAZolam Duanne Moron) tablet 0.5 mg  0.5 mg Oral BID PRN Tat, Shanon Brow, MD       amiodarone (NEXTERONE PREMIX) 360-4.14 MG/200ML-% (1.8 mg/mL) IV infusion  30 mg/hr Intravenous Continuous Crosley, Debby, MD 16.67 mL/hr at 02/18/22 1404 30 mg/hr at 02/18/22 1404   ceFAZolin (ANCEF) IVPB 2g/100 mL premix  2 g Intravenous Q8H Sandford Craze, RPH 200 mL/hr at 02/18/22 1532 2 g at 02/18/22 1532   Chlorhexidine Gluconate Cloth 2 % PADS 6 each  6 each Topical Daily Guilford Shi, MD   6 each at 02/18/22 1000   insulin aspart (novoLOG) injection 0-15 Units  0-15 Units Subcutaneous TID WC Guilford Shi, MD   2 Units at 02/18/22 1321   lactated ringers infusion   Intravenous Continuous Elodia Florence., MD 75 mL/hr at 02/18/22 1625 New Bag at 02/18/22 1625   magnesium oxide (MAG-OX) tablet 400 mg  400 mg Oral BID Tat, Shanon Brow, MD   400 mg at 02/18/22 7353   metFORMIN (GLUCOPHAGE-XR) 24 hr tablet 1,000 mg  1,000 mg Oral Q supper Guilford Shi, MD        metFORMIN (GLUCOPHAGE-XR) 24 hr tablet 500 mg  500 mg Oral Q breakfast Guilford Shi, MD   500 mg at 02/18/22 0916   metoprolol tartrate (LOPRESSOR) injection 5 mg  5 mg Intravenous Q4H PRN Crosley, Debby, MD   5 mg at 02/18/22 0034   metoprolol tartrate (LOPRESSOR) tablet 25 mg  25 mg Oral BID Crosley, Debby, MD   25 mg at 02/18/22 0917   ondansetron (ZOFRAN) tablet 4 mg  4 mg Oral Q6H PRN Guilford Shi, MD       Or   ondansetron (ZOFRAN) injection 4 mg  4 mg Intravenous Q6H PRN Guilford Shi, MD       pravastatin (PRAVACHOL) tablet 40 mg  40 mg Oral q1800 Guilford Shi, MD   40 mg at 03/01/2022 2001   Facility-Administered Medications Ordered in Other  Encounters  Medication Dose Route Frequency Provider Last Rate Last Admin   0.9 %  sodium chloride infusion (Manually program via Guardrails IV Fluids)  250 mL Intravenous Once Derek Jack, MD       sodium chloride flush (NS) 0.9 % injection 10 mL  10 mL Intracatheter PRN Derek Jack, MD   10 mL at 12/20/21 1429     No Known Allergies:   Family History  Problem Relation Age of Onset   Heart failure Father    CAD Father    Diabetes Sister    Other Sister        blood clots   Breast cancer Sister    Alzheimer's disease Mother    Diabetes Sister    Hypertension Maternal Grandmother    Colon cancer Neg Hx   :   Social History   Socioeconomic History   Marital status: Widowed    Spouse name: Not on file   Number of children: Not on file   Years of education: Not on file   Highest education level: Not on file  Occupational History   Not on file  Tobacco Use   Smoking status: Former    Packs/day: 0.01    Years: 1.00    Total pack years: 0.01    Types: Cigarettes    Start date: 03/05/1976    Quit date: 03/06/2003    Years since quitting: 18.9   Smokeless tobacco: Never  Vaping Use   Vaping Use: Never used  Substance and Sexual Activity   Alcohol use: No    Alcohol/week: 0.0 standard drinks  of alcohol   Drug use: No   Sexual activity: Not Currently    Birth control/protection: Post-menopausal  Other Topics Concern   Not on file  Social History Narrative   Not on file   Social Determinants of Health   Financial Resource Strain: Not on file  Food Insecurity: No Food Insecurity (12/10/2021)   Hunger Vital Sign    Worried About Running Out of Food in the Last Year: Never true    Ran Out of Food in the Last Year: Never true  Transportation Needs: No Transportation Needs (12/10/2021)   PRAPARE - Hydrologist (Medical): No    Lack of Transportation (Non-Medical): No  Physical Activity: Not on file  Stress: Not on file  Social Connections: Not on file  Intimate Partner Violence: Not At Risk (12/10/2021)   Humiliation, Afraid, Rape, and Kick questionnaire    Fear of Current or Ex-Partner: No    Emotionally Abused: No    Physically Abused: No    Sexually Abused: No   Exam: Patient Vitals for the past 24 hrs:  BP Temp Temp src Pulse Resp SpO2 Height Weight  02/18/22 1630 106/81 -- -- (!) 121 -- 100 % -- --  02/18/22 1627 -- 98.4 F (36.9 C) Oral -- -- -- -- --  02/18/22 1612 -- 98.8 F (37.1 C) -- (!) 123 (!) 26 -- -- --  02/18/22 1603 95/71 98.8 F (37.1 C) Oral (!) 108 (!) 25 100 % -- --  02/18/22 1539 (!) 91/57 -- -- (!) 123 -- 100 % -- --  02/18/22 1536 (!) 81/53 99.8 F (37.7 C) Oral (!) 125 (!) 26 100 % -- --  02/18/22 1300 (!) 103/37 -- -- -- (!) 31 -- -- --  02/18/22 1255 -- 97.9 F (36.6 C) Oral (!) 121 (!) 36 100 % -- --  02/18/22 1230 (!)  115/99 98.3 F (36.8 C) Oral (!) 117 -- 98 % -- --  02/18/22 1115 114/69 98.1 F (36.7 C) Oral 68 (!) 21 100 % -- --  02/18/22 1018 96/77 98.7 F (37.1 C) Oral -- -- -- -- --  02/18/22 0917 99/77 -- -- (!) 147 -- -- -- --  02/18/22 0829 95/78 98.4 F (36.9 C) Oral 71 (!) 22 100 % -- --  02/18/22 0618 95/63 (!) 101.1 F (38.4 C) Rectal (!) 120 (!) 34 100 % -- --  02/18/22 0511 -- (!)  101.9 F (38.8 C) Rectal -- -- -- -- --  02/18/22 0436 -- (!) 102.4 F (39.1 C) Oral -- -- -- -- --  02/18/22 0414 (!) 101/58 -- -- (!) 128 -- -- -- --  02/18/22 0300 -- (!) 101.5 F (38.6 C) Oral -- -- -- -- --  02/18/22 0150 107/75 -- -- (!) 116 -- -- -- --  02/18/22 0038 (!) 120/97 -- -- (!) 115 (!) 28 100 % -- --  02/18/22 0024 122/85 -- -- (!) 130 (!) 26 97 % -- --  02/09/2022 2356 -- -- -- -- -- -- _0  (1.575 m) 248 lb 9.6 oz (112.8 kg)  03/02/2022 2353 (!) 141/83 99.3 F (37.4 C) Oral (!) 141 (!) 34 98 % -- --  02/11/2022 2232 (!) 142/72 -- -- (!) 160 -- -- -- --  03/04/2022 2156 -- (!) 100.8 F (38.2 C) Oral -- -- -- -- --  02/23/2022 2145 (!) 144/75 -- -- (!) 150 (!) 33 99 % -- --  02/12/2022 2130 (!) 144/109 -- -- (!) 41 (!) 34 91 % -- --  02/09/2022 2115 (!) 148/73 -- -- (!) 149 (!) 30 100 % -- --  02/08/2022 2100 (!) 141/75 -- -- (!) 137 (!) 35 98 % -- --  02/20/2022 2045 127/79 -- -- (!) 139 (!) 32 99 % -- --  03/01/2022 2030 134/69 -- -- (!) 137 (!) 29 100 % -- --  02/02/2022 2015 131/72 -- -- (!) 139 (!) 32 100 % -- --  02/25/2022 2000 126/79 -- -- (!) 139 (!) 40 100 % -- --  02/02/2022 1945 118/63 -- -- (!) 123 (!) 35 99 % -- --  02/11/2022 1930 114/77 -- -- (!) 132 (!) 30 98 % -- --  02/14/2022 1900 102/87 -- -- (!) 122 (!) 21 99 % -- --  02/06/2022 1830 121/75 -- -- (!) 147 (!) 25 98 % -- --  02/27/2022 1807 -- (!) 101.7 F (38.7 C) Oral -- -- -- -- --    Physical Exam Constitutional:      General: She is not in acute distress.    Appearance: She is obese. She is ill-appearing.  HENT:     Head:     Salivary Glands: Left salivary gland is diffusely enlarged.     Comments: Left parotid enlarged and inflamed. Cardiovascular:     Rate and Rhythm: Tachycardia present.     Pulses: Normal pulses.     Heart sounds: Normal heart sounds.  Pulmonary:     Effort: Pulmonary effort is normal.     Breath sounds: Normal breath sounds.  Musculoskeletal:        General: No swelling. Normal range of  motion.     Cervical back: No rigidity or tenderness.  Lymphadenopathy:     Cervical: No cervical adenopathy.  Skin:    General: Skin is warm and dry.  Neurological:     General: No  focal deficit present.     Mental Status: She is alert.  Psychiatric:        Mood and Affect: Mood normal.       Lab Results  Component Value Date   WBC <0.1 (LL) 02/18/2022   HGB 6.6 (LL) 02/18/2022   HCT 19.9 (L) 02/18/2022   PLT 17 (LL) 02/18/2022   GLUCOSE 172 (H) 02/18/2022   CHOL 171 10/12/2015   TRIG 109 10/12/2015   HDL 46 10/12/2015   LDLCALC 103 (H) 10/12/2015   ALT 19 02/14/2022   AST 34 02/22/2022   NA 136 02/18/2022   K 4.1 02/18/2022   CL 108 02/18/2022   CREATININE 1.36 (H) 02/18/2022   BUN 24 (H) 02/18/2022   CO2 16 (L) 02/18/2022    CT Head Wo Contrast  Result Date: 02/28/2022 CLINICAL DATA:  Trauma, fall EXAM: CT HEAD WITHOUT CONTRAST TECHNIQUE: Contiguous axial images were obtained from the base of the skull through the vertex without intravenous contrast. RADIATION DOSE REDUCTION: This exam was performed according to the departmental dose-optimization program which includes automated exposure control, adjustment of the mA and/or kV according to patient size and/or use of iterative reconstruction technique. COMPARISON:  None Available. FINDINGS: Brain: No acute intracranial findings are seen. There are no signs of bleeding within the cranium. Ventricles are not dilated. Cortical sulci are prominent. There is no focal edema or mass effect. Vascular: Unremarkable. Skull: Unremarkable. Sinuses/Orbits: Unremarkable. Other: None. IMPRESSION: No acute intracranial findings are seen in noncontrast CT brain. Electronically Signed   By: Elmer Picker M.D.   On: 02/25/2022 10:58   DG Chest Port 1 View  Result Date: 03/03/2022 CLINICAL DATA:  Tachycardia and weakness EXAM: PORTABLE CHEST 1 VIEW COMPARISON:  12/07/2021 and prior studies FINDINGS: This is a low volume study.  Cardiomegaly and LEFT PICC line with tip overlying the RIGHT atrium again noted. Defibrillator/pacing pads overlying the chest are noted. There is no evidence of focal airspace disease, pulmonary edema, suspicious pulmonary nodule/mass, pleural effusion, or pneumothorax. No acute bony abnormalities are identified. IMPRESSION: Cardiomegaly without evidence of acute cardiopulmonary disease. Electronically Signed   By: Margarette Canada M.D.   On: 02/18/2022 09:06     CT Head Wo Contrast  Result Date: 02/09/2022 CLINICAL DATA:  Trauma, fall EXAM: CT HEAD WITHOUT CONTRAST TECHNIQUE: Contiguous axial images were obtained from the base of the skull through the vertex without intravenous contrast. RADIATION DOSE REDUCTION: This exam was performed according to the departmental dose-optimization program which includes automated exposure control, adjustment of the mA and/or kV according to patient size and/or use of iterative reconstruction technique. COMPARISON:  None Available. FINDINGS: Brain: No acute intracranial findings are seen. There are no signs of bleeding within the cranium. Ventricles are not dilated. Cortical sulci are prominent. There is no focal edema or mass effect. Vascular: Unremarkable. Skull: Unremarkable. Sinuses/Orbits: Unremarkable. Other: None. IMPRESSION: No acute intracranial findings are seen in noncontrast CT brain. Electronically Signed   By: Elmer Picker M.D.   On: 02/22/2022 10:58   DG Chest Port 1 View  Result Date: 02/05/2022 CLINICAL DATA:  Tachycardia and weakness EXAM: PORTABLE CHEST 1 VIEW COMPARISON:  12/07/2021 and prior studies FINDINGS: This is a low volume study. Cardiomegaly and LEFT PICC line with tip overlying the RIGHT atrium again noted. Defibrillator/pacing pads overlying the chest are noted. There is no evidence of focal airspace disease, pulmonary edema, suspicious pulmonary nodule/mass, pleural effusion, or pneumothorax. No acute bony abnormalities are  identified. IMPRESSION:  Cardiomegaly without evidence of acute cardiopulmonary disease. Electronically Signed   By: Margarette Canada M.D.   On: 02/26/2022 09:06    Assessment and Plan:   This is a very pleasant 66 yr old female patient with AML on decitabine and venetoclax co managed by Dr Florene Glen and Dr Delton Coombes admitted with neutropenic fever. Likely source is cellulitis vs parotitis of the left. Blood culture positive for Staph species Appreciate ID recommendations regarding antibiotics Continue PRBC to maintain a Hb of 8 and Platelet count of 20 K given sepsis. Agree with leukocyte reduced and irradiated blood products. She will follow up with Dr Florene Glen and Dr Delton Coombes outpatient.  The length of time of the face-to-face encounter was 55 minutes. More than 50% of time was spent counseling and coordination of care.  Thank you for this referral.

## 2022-02-18 NOTE — Progress Notes (Signed)
Rounding Note    Patient Name: Kaitlyn Hull Date of Encounter: 02/19/2022  [place] Cardiologist: Carlyle Dolly, MD   Subjective   Remains in Afib with HR 100-130s BP slightly improved from SBP 80 now to 90-110s Blood cultures positive for staph; now on ABX  States she does not feel well this morning with generalized malaise. No chest pain or SOB.   Inpatient Medications    Scheduled Meds:  sodium chloride   Intravenous Once   sodium chloride   Intravenous Once   acetaminophen  650 mg Oral Once   Chlorhexidine Gluconate Cloth  6 each Topical Daily   diltiazem  30 mg Oral Q8H   insulin aspart  0-15 Units Subcutaneous TID WC   magnesium oxide  400 mg Oral BID   metFORMIN  1,000 mg Oral Q supper   pravastatin  40 mg Oral q1800   Continuous Infusions:  sodium chloride 75 mL/hr at 02/19/22 0806   amiodarone 30 mg/hr (02/19/22 0537)    ceFAZolin (ANCEF) IV 200 mL/hr at 02/19/22 0537   PRN Meds: acetaminophen **OR** acetaminophen, albuterol, ALPRAZolam, metoprolol tartrate, ondansetron **OR** ondansetron (ZOFRAN) IV   Vital Signs    Vitals:   02/19/22 0644 02/19/22 0748 02/19/22 0817 02/19/22 0900  BP:  111/70 101/82 99/72  Pulse: (!) 119 (!) 136  (!) 130  Resp:  '18 16 16  '$ Temp:  99 F (37.2 C) 100.1 F (37.8 C) (!) 100.8 F (38.2 C)  TempSrc:  Oral Oral Oral  SpO2: 99% 99%  99%  Weight:      Height:        Intake/Output Summary (Last 24 hours) at 02/19/2022 0947 Last data filed at 02/19/2022 0537 Gross per 24 hour  Intake 2454.58 ml  Output 450 ml  Net 2004.58 ml      02/07/2022   11:56 PM 03/02/2022    8:42 AM 02/05/2022    1:39 PM  Last 3 Weights  Weight (lbs) 248 lb 9.6 oz 258 lb 257 lb 12.8 oz  Weight (kg) 112.764 kg 117.028 kg 116.937 kg      Telemetry    Afib with HR 110-130- Personally Reviewed  ECG    No new tracing- Personally Reviewed  Physical Exam   GEN: No acute distress.  Laying in bed Neck: No  JVD Cardiac: Irregular, tachycardic. No murmurs Respiratory: Clear to auscultation bilaterally. GI: Soft, nontender, non-distended  MS: Trace edema, warm Neuro: AAOx3 Psych: Normal affect   Labs    High Sensitivity Troponin:  No results for input(s): "TROPONINIHS" in the last 720 hours.   Chemistry Recent Labs  Lab 03/01/2022 0845 02/11/2022 0902 02/18/22 0645 02/18/22 0646 02/18/22 1035 02/19/22 0347  NA 130*  --  136  --   --  130*  K 3.8  --  5.7*  --  4.1 3.2*  CL 98  --  108  --   --  101  CO2 21*  --  16*  --   --  20*  GLUCOSE 223*  --  172*  --   --  108*  BUN 27*  --  24*  --   --  21  CREATININE 1.61*  --  1.36*  --   --  1.30*  CALCIUM 8.9  --  8.3*  --   --  7.6*  MG  --  0.7*  --  1.7  --  1.6*  PROT 7.3  --   --   --   --  5.4*  ALBUMIN 3.3*  --   --   --   --  2.1*  AST 34  --   --   --   --  31  ALT 19  --   --   --   --  23  ALKPHOS 68  --   --   --   --  40  BILITOT 3.1*  --   --   --   --  1.8*  GFRNONAA 35*  --  43*  --   --  46*  ANIONGAP 11  --  12  --   --  9    Lipids No results for input(s): "CHOL", "TRIG", "HDL", "LABVLDL", "LDLCALC", "CHOLHDL" in the last 168 hours.  Hematology Recent Labs  Lab 02/18/22 1035 02/18/22 2343 02/19/22 0347  WBC <0.1* <0.1* <0.1*  RBC 2.19* 2.52* 2.58*  HGB 6.6* 7.8* 7.9*  HCT 19.9* 22.3* 23.3*  MCV 90.9 88.5 90.3  MCH 30.1 31.0 30.6  MCHC 33.2 35.0 33.9  RDW 21.2* 18.0* 18.2*  PLT 17* 13* 12*   Thyroid No results for input(s): "TSH", "FREET4" in the last 168 hours.  BNP Recent Labs  Lab 02/02/2022 0902 02/19/22 0347  BNP 370.0* 499.1*    DDimer No results for input(s): "DDIMER" in the last 168 hours.   Radiology    DG CHEST PORT 1 VIEW  Result Date: 02/19/2022 CLINICAL DATA:  Airspace disease on neck CT. EXAM: PORTABLE CHEST 1 VIEW COMPARISON:  02/10/2022 FINDINGS: 0803 hours. Scattered areas of ill-defined patchy and nodular airspace disease noted bilaterally. No edema or substantial effusion.  The cardio pericardial silhouette is enlarged. Telemetry leads overlie the chest. IMPRESSION: Scattered areas of ill-defined patchy and nodular airspace disease bilaterally. Imaging features raise concern for multifocal pneumonia. Follow-up recommended to ensure resolution. Electronically Signed   By: Misty Stanley M.D.   On: 02/19/2022 08:40   CT SOFT TISSUE NECK W CONTRAST  Result Date: 02/19/2022 CLINICAL DATA:  66 year old female with left facial swelling and redness. Recent fall. EXAM: CT NECK WITH CONTRAST TECHNIQUE: Multidetector CT imaging of the neck was performed using the standard protocol following the bolus administration of intravenous contrast. RADIATION DOSE REDUCTION: This exam was performed according to the departmental dose-optimization program which includes automated exposure control, adjustment of the mA and/or kV according to patient size and/or use of iterative reconstruction technique. CONTRAST:  73m OMNIPAQUE IOHEXOL 350 MG/ML SOLN COMPARISON:  Head CT 02/28/2022. CTA chest 12/02/2021. FINDINGS: Pharynx and larynx: Glottis is closed. And the nasopharynx is effaced at the level of the soft palate. But otherwise laryngeal and pharyngeal soft tissue contours are within normal limits. Parapharyngeal and retropharyngeal spaces appear to remain normal. Salivary glands: Negative sublingual space. Submandibular glands appear symmetric and within normal limits. The right parotid gland appears normal. But there is asymmetric heterogeneous increased density in the left parotid gland (series 3, image 43 and see coronal image 73) no discrete parotid mass is associated. Soft tissue at the left stylomastoid foramen appears to remain normal. The deep lobe of the gland has a more normal appearance. There does appear to be mild overlying subcutaneous soft tissue stranding. No sialolithiasis.  Left parotid duct does not appear enlarged. Thyroid: Negative. Lymph nodes: No cervical lymphadenopathy.  However, left level 2 lymph nodes are larger than those on the right (only 6 mm short axis) and might be reactive. Vascular: Suboptimal intravascular contrast bolus. But the major vascular structures in the neck and at the  skull base appear to remain patent. Tortuous carotid arteries each with a partially retropharyngeal course. Bilateral carotid bifurcation calcified atherosclerosis. Limited intracranial: Negative. Visualized orbits: Negative. Mastoids and visualized paranasal sinuses: Visualized paranasal sinuses and mastoids are clear. Tympanic cavities are clear. Skeleton: Mandible intact and normally located. Cervical spine disc and endplate degeneration. No acute osseous abnormality identified. Medial, superior left breast soft tissue thickening and architectural distortion on series 3, image 131 appears stable since September, with other postoperative changes to the left breast visible at that time. Upper chest: Multifocal Patchy and confluent scattered bilateral upper lobe pulmonary opacity. This is new from the September CTA. Visible major airways remain patent. No pleural effusion identified. Calcified aortic atherosclerosis. Visible superior mediastinal lymph nodes remain within normal limits. IMPRESSION: 1. Asymmetric density and heterogeneity of the left parotid gland compatible with Acute Parotitis, or possibly parotid injury in the setting of fall. No complicating features. 2. Multifocal bilateral upper lobe pulmonary opacity is new from a September CTA and appears infectious/inflammatory. Consider Bilateral Pneumonia including viral/atypical etiologies. Electronically Signed   By: Genevie Ann M.D.   On: 02/19/2022 06:09   ECHOCARDIOGRAM COMPLETE  Result Date: 02/18/2022    ECHOCARDIOGRAM REPORT   Patient Name:   Kaitlyn Hull [facility] Date of Exam: 02/18/2022 Medical Rec #:  [id number]     Height:       62.0 in Accession #:    [id number]    Weight:       248.6 lb Date of Birth:  Jan 25, 1956    BSA:           2.097 m Patient Age:    66 years      BP:           103/37 mmHg Patient Gender: F             HR:           124 bpm. Exam Location:  Inpatient Procedure: 2D Echo, Cardiac Doppler and Color Doppler Indications:    Atrial Fibrillation I48.91  History:        Patient has prior history of Echocardiogram examinations, most                 recent 07/18/2021. Arrythmias:Atrial Fibrillation and                 Tachycardia; Risk Factors:Diabetes, Hypertension and Former                 Smoker.  Sonographer:    Greer Pickerel Referring Phys: [id number] [place] Shi  Sonographer Comments: Image acquisition challenging due to patient body habitus and Image acquisition challenging due to respiratory motion. IMPRESSIONS  1. Left ventricular ejection fraction, by estimation, is 50 to 55%. The left ventricle has low normal function. The left ventricle has no regional wall motion abnormalities. Left ventricular diastolic parameters are indeterminate.  2. Right ventricular systolic function is normal. The right ventricular size is normal. There is normal pulmonary artery systolic pressure.  3. The mitral valve was not well visualized. Moderate mitral valve regurgitation.  4. The aortic valve is tricuspid. There is mild calcification of the aortic valve. There is mild thickening of the aortic valve. Aortic valve regurgitation is not visualized.  5. The inferior vena cava is normal in size with greater than 50% respiratory variability, suggesting right atrial pressure of 3 mmHg. Comparison(s): Prior images reviewed side by side. Increase in mitral regurgitation. FINDINGS  Left Ventricle: Left ventricular ejection fraction, by estimation, is 50  to 55%. The left ventricle has low normal function. The left ventricle has no regional wall motion abnormalities. The left ventricular internal cavity size was normal in size. There is no left ventricular hypertrophy. Left ventricular diastolic parameters are indeterminate. Right Ventricle: The  right ventricular size is normal. No increase in right ventricular wall thickness. Right ventricular systolic function is normal. There is normal pulmonary artery systolic pressure. The tricuspid regurgitant velocity is 1.54 m/s, and  with an assumed right atrial pressure of 3 mmHg, the estimated right ventricular systolic pressure is 54.0 mmHg. Left Atrium: Left atrial size was normal in size. Right Atrium: Right atrial size was normal in size. Pericardium: Trivial pericardial effusion is present. Presence of epicardial fat layer. Mitral Valve: The mitral valve was not well visualized. Moderate mitral valve regurgitation. Tricuspid Valve: The tricuspid valve is normal in structure. Tricuspid valve regurgitation is not demonstrated. No evidence of tricuspid stenosis. Aortic Valve: The aortic valve is tricuspid. There is mild calcification of the aortic valve. There is mild thickening of the aortic valve. Aortic valve regurgitation is not visualized. Pulmonic Valve: The pulmonic valve was grossly normal. Pulmonic valve regurgitation is not visualized. No evidence of pulmonic stenosis. Aorta: The aortic root and ascending aorta are structurally normal, with no evidence of dilitation. Venous: The inferior vena cava is normal in size with greater than 50% respiratory variability, suggesting right atrial pressure of 3 mmHg. IAS/Shunts: No atrial level shunt detected by color flow Doppler.  LEFT VENTRICLE PLAX 2D LVIDd:         4.40 cm   Diastology LVIDs:         3.20 cm   LV e' medial:    7.72 cm/s LV PW:         1.30 cm   LV E/e' medial:  13.3 LV IVS:        1.00 cm   LV e' lateral:   11.30 cm/s LVOT diam:     1.60 cm   LV E/e' lateral: 9.1 LV SV:         26 LV SV Index:   12 LVOT Area:     2.01 cm  RIGHT VENTRICLE RV S prime:     12.40 cm/s TAPSE (M-mode): 1.3 cm LEFT ATRIUM             Index        RIGHT ATRIUM           Index LA diam:        4.30 cm 2.05 cm/m   RA Area:     17.40 cm LA Vol (A2C):   63.7 ml 30.38  ml/m  RA Volume:   47.90 ml  22.84 ml/m LA Vol (A4C):   54.3 ml 25.90 ml/m LA Biplane Vol: 62.2 ml 29.66 ml/m  AORTIC VALVE LVOT Vmax:   99.00 cm/s LVOT Vmean:  63.900 cm/s LVOT VTI:    0.127 m  AORTA Ao Root diam: 3.40 cm Ao Asc diam:  3.40 cm MITRAL VALVE                TRICUSPID VALVE MV Area (PHT): 9.60 cm     TR Peak grad:   9.5 mmHg MV Decel Time: 79 msec      TR Vmax:        154.00 cm/s MR Peak grad: 50.8 mmHg MR Vmax:      356.50 cm/s   SHUNTS MV E velocity: 103.00 cm/s  Systemic VTI:  0.13 m MV  A velocity: 70.40 cm/s   Systemic Diam: 1.60 cm MV E/A ratio:  1.46 Rudean Haskell MD Electronically signed by Rudean Haskell MD Signature Date/Time: 02/18/2022/6:16:34 PM    Final    CT Head Wo Contrast  Result Date: 02/18/2022 CLINICAL DATA:  Trauma, fall EXAM: CT HEAD WITHOUT CONTRAST TECHNIQUE: Contiguous axial images were obtained from the base of the skull through the vertex without intravenous contrast. RADIATION DOSE REDUCTION: This exam was performed according to the departmental dose-optimization program which includes automated exposure control, adjustment of the mA and/or kV according to patient size and/or use of iterative reconstruction technique. COMPARISON:  None Available. FINDINGS: Brain: No acute intracranial findings are seen. There are no signs of bleeding within the cranium. Ventricles are not dilated. Cortical sulci are prominent. There is no focal edema or mass effect. Vascular: Unremarkable. Skull: Unremarkable. Sinuses/Orbits: Unremarkable. Other: None. IMPRESSION: No acute intracranial findings are seen in noncontrast CT brain. Electronically Signed   By: Elmer Picker M.D.   On: 02/13/2022 10:58    Cardiac Studies  TTE 02/18/22: IMPRESSIONS     1. Left ventricular ejection fraction, by estimation, is 50 to 55%. The  left ventricle has low normal function. The left ventricle has no regional  wall motion abnormalities. Left ventricular diastolic  parameters are  indeterminate.   2. Right ventricular systolic function is normal. The right ventricular  size is normal. There is normal pulmonary artery systolic pressure.   3. The mitral valve was not well visualized. Moderate mitral valve  regurgitation.   4. The aortic valve is tricuspid. There is mild calcification of the  aortic valve. There is mild thickening of the aortic valve. Aortic valve  regurgitation is not visualized.   5. The inferior vena cava is normal in size with greater than 50%  respiratory variability, suggesting right atrial pressure of 3 mmHg.   Comparison(s): Prior images reviewed side by side. Increase in mitral  regurgitation.     Patient Profile     66 y.o. female with a hx of chronic atrial fibrillation, type 2 DM, HTN, AML, morbid obesity who presented with weakness, poor PO intake and falls found to be septic with staph bacteremia. Course complicated by Afib with RVR for which Cardiology was consulted.  Assessment & Plan    #Permanent atrial fibrillation:  Patient with known permanent A-fib who developed RVR in the setting of sepsis. Has been started on IV amiodarone for rate control given hypotension.  She is not a candidate for anticoagulation given severe anemia/thrombocytopenia -Plan will be for rate control, suspect will improve with treatment of her sepsis -Will dig load for additional rate control -Continue IV amiodarone for rate control for now (low risk of chemical cardioversion due to permanent Afib and patient cannot tolerate nodal agents due to hypotension) -Cannot tolerate AC due to severe thrombocytopenia and anemia -Cannot tolerate BB/dilt due to hypotension  #Sepsis secondary to staph bacteremia: Presented with fever, neutropenia.  Found to have staph bacteremia with left neck cellulitis. Now on cefazloin. TTE with LVEF 50-55%, normal RV, no valvular vegetations visualized. Not a candidate for TEE at this time due to severe  thrombocytopenia.  -TTE without significant valvular pathology -Not currently a candidate for TEE given severe thrombocytopenia.  Need platelet count greater than 50   For questions or updates, please contact [place] Please consult [email or web address] for contact info under        Signed, Freada Bergeron, MD  02/19/2022, 9:47 AM

## 2022-02-18 NOTE — Progress Notes (Addendum)
PROGRESS NOTE    [name]  UVO:[id number] DOB: October 21, 1955 DOA: 02/23/2022 PCP: Ledell Noss[facility]  Chief Complaint  Patient presents with   Weakness    Brief Narrative:  Kaitlyn Hull is Kaitlyn Hull 66 year old female with [name] history of chronic atrial fibrillation, diabetes mellitus type 2, hypertension, AML morbid obesity presenting with 2-day history of generalized weakness, decreased oral intake, and falling.  The patient denies any fevers or chills.  She has had some nausea without any emesis.  She denies any diarrhea.  She denies any chest pain or shortness of breath or palpitations.  She has not had any dizziness or syncope.  She denies hitting her head.  She denies any pain at this time. She denies any hemoptysis, hematemesis, hematochezia, melena.  She had 1 episode of emesis on the morning of admission but denies any abdominal pain, dysuria, hematemesis.  There is no coffee-ground emesis.  She denies any coughing or sputum production.  She has not had any fevers or chills. She states that she has been compliant with all her medications except up to the last 2 days.  Because she was feeling nauseous and poorly, she decided not to take any of her medications including her apixaban. In the ED, the patient was afebrile and hemodynamically stable.  She was initially hypotensive with Karem Tomaso blood pressure of 89/60.  She improved with IV fluids.  She was started on diltiazem drip.  Cardiology was consulted by EDP and recommended transfer to [facility].  Assessment & Plan:   Principal Problem:   Atrial fibrillation with tachycardic ventricular rate (HCC) Active Problems:   Acute Myeloid Leukemia/Abnormal blood smear/   Neutropenia (HCC)   Morbid obesity due to excess calories (HCC)   Chronic atrial fibrillation (HCC)   Acute myeloid leukemia in adult [facility])   Neutropenic fever (HCC)  Sepsis due to Staph Bacteremia Left Neck Cellulitis  Febrile Neutropenia Sepsis ruled in with fever,  neutropenia, tachycardia, tachypnea Workup thus far revealing for L neck skin soft tissue infection, no clear fluctuance on exam, lower suspicion for abscess Blood cultures with gram positive cocci in pairs and clusters (BCID without resistance gene detected) UA with nitrite, many bacteria -> follow urine culture CXR without evidence of acute cardiopulm dz CT neck with contrast pending Echocardiogram ordered given staph bacteremia Will consult ID and follow recommendations Will continue broad spectrum abx with vanc/cefepime/flagyl, follow final culture results and narrow as able  Acute Myeloid Leukemia Pancytopenia Comanaged at [facility] by Dr. Jerrye Noble and AP with Dr. Delton Coombes Receiving venetoclax/decitabine (last cycle started 11/27).  Currently holding venetoclax. S/p 1 unit platelets 12/16.  Additional units ordered - Pope Brunty little unclear what's been given based on charting and what's due, platelets were held overnight due to fever, will discuss with RN. Granix x1 was ordered by Dr. Vonda Antigua this AM quantity not sufficient, repeat ordered Appreciate assistance from Dr. Chryl Heck  Transfuse for Hb <8 and platelets <20   Atrial Fibrillation with RVR Related to above BP on soft side, continue metoprolol as tolerated Continue amiodarone per cardiology  Acute Kidney Injury Due to sepsis Follow with management Improving UA with 30 mg/dl protein, 0-5 RBC  Hyperkalemia Repeat labs  T2DM Metformin currently on hold A1c from 10/2021 6.2 SSI   Obesity Body mass index is 45.47 kg/m.    DVT prophylaxis: SCD's Code Status: full Family Communication: none at bedside Disposition:   Status is: Inpatient Remains inpatient appropriate because: continued need for inpatient  care with febrile neutropenia, RVR   Consultants:  Cardiology hematology  Procedures:  none  Antimicrobials:  Anti-infectives (From admission, onward)    Start     Dose/Rate Route Frequency Ordered Stop    02/19/22 1800  vancomycin (VANCOREADY) IVPB 1500 mg/300 mL        1,500 mg 150 mL/hr over 120 Minutes Intravenous Every 48 hours 02/15/2022 1811     02/18/22 0600  ceFEPIme (MAXIPIME) 2 g in sodium chloride 0.9 % 100 mL IVPB        2 g 200 mL/hr over 30 Minutes Intravenous Every 12 hours 02/15/2022 1811     02/14/2022 1515  vancomycin (VANCOCIN) 2,250 mg in sodium chloride 0.9 % 500 mL IVPB        2,250 mg 261.3 mL/hr over 120 Minutes Intravenous  Once 02/02/2022 1509 02/09/2022 2133   02/07/2022 1515  ceFEPIme (MAXIPIME) 2 g in sodium chloride 0.9 % 100 mL IVPB        2 g 200 mL/hr over 30 Minutes Intravenous  Once 02/03/2022 1509 02/19/2022 2229      Subjective: No CP, SOB, abdominal pain Has left neck swelling worse recently   Objective: Vitals:   02/18/22 0511 02/18/22 0618 02/18/22 0829 02/18/22 0917  BP:  '95/63 95/78 99/77 '$  Pulse:  (!) 120 71 (!) 147  Resp:  (!) 34 (!) 22   Temp: (!) 101.9 F (38.8 C) (!) 101.1 F (38.4 C) 98.4 F (36.9 C)   TempSrc: Rectal Rectal Oral   SpO2:  100% 100%   Weight:      Height:        Intake/Output Summary (Last 24 hours) at 02/18/2022 0925 Last data filed at 02/18/2022 0400 Gross per 24 hour  Intake 2863.34 ml  Output --  Net 2863.34 ml   Filed Weights   03/01/2022 0842 02/18/2022 2356  Weight: 117 kg 112.8 kg    Examination:  General: No acute distress. Cardiovascular: irregularly irregular, tachycardic Lungs: unlabored Abdomen: Soft, nontender, nondistended Neurological: Alert and oriented 3. Moves all extremities 4 . Cranial nerves II through XII grossly intact. Skin: scattered ecchymoses  Extremities: No clubbing or cyanosis. No edema.  Data Reviewed: I have personally reviewed following labs and imaging studies  CBC: Recent Labs  Lab 02/20/2022 0845 02/18/22 0430  WBC 0.1* DUPL  NEUTROABS 0.0* PENDING  HGB 7.9* DUPL  HCT 23.0* DUPL  MCV 88.8 DUPL  PLT 7* DUPL    Basic Metabolic Panel: Recent Labs  Lab 02/15/2022 0845  02/22/2022 0902 02/18/22 0645  NA 130*  --  136  K 3.8  --  5.7*  CL 98  --  108  CO2 21*  --  16*  GLUCOSE 223*  --  172*  BUN 27*  --  24*  CREATININE 1.61*  --  1.36*  CALCIUM 8.9  --  8.3*  MG  --  0.7*  --     GFR: Estimated Creatinine Clearance: 49 mL/min (Jarnell Cordaro) (by C-G formula based on SCr of 1.36 mg/dL (H)).  Liver Function Tests: Recent Labs  Lab 02/16/2022 0845  AST 34  ALT 19  ALKPHOS 68  BILITOT 3.1*  PROT 7.3  ALBUMIN 3.3*    CBG: Recent Labs  Lab 02/18/22 0827  GLUCAP 132*     Recent Results (from the past 240 hour(s))  Resp panel by RT-PCR (RSV, Flu Milus Fritze&B, Covid) Anterior Nasal Swab     Status: None   Collection Time: 03/02/2022  8:59 AM  Specimen: Anterior Nasal Swab  Result Value Ref Range Status   SARS Coronavirus 2 by RT PCR NEGATIVE NEGATIVE Final    Comment: (NOTE) SARS-CoV-2 target nucleic acids are NOT DETECTED.  The SARS-CoV-2 RNA is generally detectable in upper respiratory specimens during the acute phase of infection. The lowest concentration of SARS-CoV-2 viral copies this assay can detect is 138 copies/mL. Vicente Weidler negative result does not preclude SARS-Cov-2 infection and should not be used as the sole basis for treatment or other patient management decisions. Glady Ouderkirk negative result may occur with  improper specimen collection/handling, submission of specimen other than nasopharyngeal swab, presence of viral mutation(s) within the areas targeted by this assay, and inadequate number of viral copies(<138 copies/mL). Janaiya Beauchesne negative result must be combined with clinical observations, patient history, and epidemiological information. The expected result is Negative.  Fact Sheet for Patients:  EntrepreneurPulse.com.au  Fact Sheet for Healthcare Providers:  IncredibleEmployment.be  This test is no t yet approved or cleared by the [place] FDA and  has been authorized for detection and/or diagnosis of SARS-CoV-2  by FDA under an Emergency Use Authorization (EUA). This EUA will remain  in effect (meaning this test can be used) for the duration of the COVID-19 declaration under Section 564(b)(1) of the Act, 21 U.S.C.section 360bbb-3(b)(1), unless the authorization is terminated  or revoked sooner.       Influenza Baelyn Doring by PCR NEGATIVE NEGATIVE Final   Influenza B by PCR NEGATIVE NEGATIVE Final    Comment: (NOTE) The Xpert Xpress SARS-CoV-2/FLU/RSV plus assay is intended as an aid in the diagnosis of influenza from Nasopharyngeal swab specimens and should not be used as Mikhayla Phillis sole basis for treatment. Nasal washings and aspirates are unacceptable for Xpert Xpress SARS-CoV-2/FLU/RSV testing.  Fact Sheet for Patients: EntrepreneurPulse.com.au  Fact Sheet for Healthcare Providers: IncredibleEmployment.be  This test is not yet approved or cleared by the [place] FDA and has been authorized for detection and/or diagnosis of SARS-CoV-2 by FDA under an Emergency Use Authorization (EUA). This EUA will remain in effect (meaning this test can be used) for the duration of the COVID-19 declaration under Section 564(b)(1) of the Act, 21 U.S.C. section 360bbb-3(b)(1), unless the authorization is terminated or revoked.     Resp Syncytial Virus by PCR NEGATIVE NEGATIVE Final    Comment: (NOTE) Fact Sheet for Patients: EntrepreneurPulse.com.au  Fact Sheet for Healthcare Providers: IncredibleEmployment.be  This test is not yet approved or cleared by the [place] FDA and has been authorized for detection and/or diagnosis of SARS-CoV-2 by FDA under an Emergency Use Authorization (EUA). This EUA will remain in effect (meaning this test can be used) for the duration of the COVID-19 declaration under Section 564(b)(1) of the Act, 21 U.S.C. section 360bbb-3(b)(1), unless the authorization is terminated or revoked.  Performed at  [facility], [address] [place], [place] [address]   Blood culture (routine x 2)     Status: None (Preliminary result)   Collection Time: 02/19/2022  3:19 PM   Specimen: BLOOD  Result Value Ref Range Status   Specimen Description BLOOD SITE NOT SPECIFIED  Final   Special Requests   Final    BOTTLES DRAWN AEROBIC AND ANAEROBIC Blood Culture results may not be optimal due to an inadequate volume of blood received in culture bottles   Culture  Setup Time   Final    GRAM POSITIVE COCCI IN BOTH AEROBIC AND ANAEROBIC BOTTLES Performed at [facility], [place] [address] [place], [place] [address]  Culture GRAM POSITIVE COCCI  Final   Report Status PENDING  Incomplete  Blood culture (routine x 2)     Status: None (Preliminary result)   Collection Time: 02/22/2022  3:24 PM   Specimen: BLOOD  Result Value Ref Range Status   Specimen Description BLOOD SITE NOT SPECIFIED  Final   Special Requests   Final    BOTTLES DRAWN AEROBIC AND ANAEROBIC Blood Culture adequate volume   Culture  Setup Time   Final    GRAM POSITIVE COCCI IN PAIRS IN CLUSTERS IN BOTH AEROBIC AND ANAEROBIC BOTTLES Organism ID to follow Performed at [facility], [place] [address] [place], [place] [address]    Culture GRAM POSITIVE COCCI  Final   Report Status PENDING  Incomplete  Culture, body fluid w Gram Stain-bottle     Status: None (Preliminary result)   Collection Time: 02/16/2022  6:25 PM   Specimen: Fluid  Result Value Ref Range Status   Specimen Description FLUID  Final   Special Requests   Final    BOTTLES DRAWN AEROBIC AND ANAEROBIC Blood Culture results may not be optimal due to an excessive volume of blood received in culture bottles   Culture   Final    NO GROWTH < 12 HOURS Performed at [facility], [place] [address], [place], [place] [address]    Report Status PENDING  Incomplete  Gram stain     Status: None   Collection Time: 03/02/2022  6:26 PM   Specimen: Fluid  Result Value Ref Range  Status   Specimen Description FLUID  Final   Special Requests NONE  Final   Gram Stain   Final    NO ORGANISMS SEEN NO WBC SEEN Performed at [facility], [place] [address], [place],  [address]    Report Status 02/05/2022 FINAL  Final         Radiology Studies: CT Head Wo Contrast  Result Date: 02/13/2022 CLINICAL DATA:  Trauma, fall EXAM: CT HEAD WITHOUT CONTRAST TECHNIQUE: Contiguous axial images were obtained from the base of the skull through the vertex without intravenous contrast. RADIATION DOSE REDUCTION: This exam was performed according to the departmental dose-optimization program which includes automated exposure control, adjustment of the mA and/or kV according to patient size and/or use of iterative reconstruction technique. COMPARISON:  None Available. FINDINGS: Brain: No acute intracranial findings are seen. There are no signs of bleeding within the cranium. Ventricles are not dilated. Cortical sulci are prominent. There is no focal edema or mass effect. Vascular: Unremarkable. Skull: Unremarkable. Sinuses/Orbits: Unremarkable. Other: None. IMPRESSION: No acute intracranial findings are seen in noncontrast CT brain. Electronically Signed   By: Elmer Picker M.D.   On: 02/03/2022 10:58   DG Chest Port 1 View  Result Date: 02/28/2022 CLINICAL DATA:  Tachycardia and weakness EXAM: PORTABLE CHEST 1 VIEW COMPARISON:  12/07/2021 and prior studies FINDINGS: This is Breanna Shorkey low volume study. Cardiomegaly and LEFT PICC line with tip overlying the RIGHT atrium again noted. Defibrillator/pacing pads overlying the chest are noted. There is no evidence of focal airspace disease, pulmonary edema, suspicious pulmonary nodule/mass, pleural effusion, or pneumothorax. No acute bony abnormalities are identified. IMPRESSION: Cardiomegaly without evidence of acute cardiopulmonary disease. Electronically Signed   By: Margarette Canada M.D.   On: 02/06/2022 09:06        Scheduled Meds:   sodium chloride   Intravenous Once   acetaminophen  650 mg Oral Once   Chlorhexidine Gluconate Cloth  6 each Topical Daily   insulin aspart  0-15 Units Subcutaneous TID WC   magnesium oxide  400 mg Oral BID   metFORMIN  1,000 mg Oral Q supper   metFORMIN  500 mg Oral Q breakfast   metoprolol tartrate  25 mg Oral BID   pravastatin  40 mg Oral q1800   Tbo-Filgrastim  480 mcg Subcutaneous Once   Continuous Infusions:  amiodarone     ceFEPime (MAXIPIME) IV 2 g (02/18/22 0550)   [START ON 02/19/2022] vancomycin       LOS: 1 day    Time spent: over 30 min    Fayrene Helper, MD [facility]   To contact the attending provider between 7A-7P or the covering provider during after hours 7P-7A, please log into the web site [email or web address] and access using universal [place] password for that web site. If you do not have the password, please call the hospital operator.  02/18/2022, 9:25 AM

## 2022-02-19 ENCOUNTER — Inpatient Hospital Stay (HOSPITAL_COMMUNITY): Payer: PPO

## 2022-02-19 DIAGNOSIS — R7881 Bacteremia: Secondary | ICD-10-CM

## 2022-02-19 DIAGNOSIS — K1120 Sialoadenitis, unspecified: Secondary | ICD-10-CM | POA: Diagnosis not present

## 2022-02-19 DIAGNOSIS — Z87891 Personal history of nicotine dependence: Secondary | ICD-10-CM

## 2022-02-19 DIAGNOSIS — I482 Chronic atrial fibrillation, unspecified: Secondary | ICD-10-CM | POA: Diagnosis not present

## 2022-02-19 DIAGNOSIS — R5081 Fever presenting with conditions classified elsewhere: Secondary | ICD-10-CM

## 2022-02-19 DIAGNOSIS — C9200 Acute myeloblastic leukemia, not having achieved remission: Secondary | ICD-10-CM | POA: Diagnosis not present

## 2022-02-19 DIAGNOSIS — D709 Neutropenia, unspecified: Secondary | ICD-10-CM

## 2022-02-19 DIAGNOSIS — I4891 Unspecified atrial fibrillation: Secondary | ICD-10-CM | POA: Diagnosis not present

## 2022-02-19 DIAGNOSIS — B9561 Methicillin susceptible Staphylococcus aureus infection as the cause of diseases classified elsewhere: Secondary | ICD-10-CM

## 2022-02-19 LAB — BPAM PLATELET PHERESIS
Blood Product Expiration Date: 202312172359
Blood Product Expiration Date: 202312182359
ISSUE DATE / TIME: 202312171218
ISSUE DATE / TIME: 202312180827
Unit Type and Rh: 5100
Unit Type and Rh: 6200

## 2022-02-19 LAB — HEMOGLOBIN A1C
Hgb A1c MFr Bld: 5.6 % (ref 4.8–5.6)
Mean Plasma Glucose: 114 mg/dL

## 2022-02-19 LAB — CBC WITH DIFFERENTIAL/PLATELET
HCT: 22.3 % — ABNORMAL LOW (ref 36.0–46.0)
HCT: 23.3 % — ABNORMAL LOW (ref 36.0–46.0)
Hemoglobin: 7.8 g/dL — ABNORMAL LOW (ref 12.0–15.0)
Hemoglobin: 7.9 g/dL — ABNORMAL LOW (ref 12.0–15.0)
MCH: 31 pg (ref 26.0–34.0)
MCH: 30.6 pg (ref 26.0–34.0)
MCHC: 35 g/dL (ref 30.0–36.0)
MCHC: 33.9 g/dL (ref 30.0–36.0)
MCV: 88.5 fL (ref 80.0–100.0)
MCV: 90.3 fL (ref 80.0–100.0)
Platelets: 13 10*3/uL — CL (ref 150–400)
Platelets: 12 10*3/uL — CL (ref 150–400)
RBC: 2.52 MIL/uL — ABNORMAL LOW (ref 3.87–5.11)
RBC: 2.58 MIL/uL — ABNORMAL LOW (ref 3.87–5.11)
RDW: 18 % — ABNORMAL HIGH (ref 11.5–15.5)
RDW: 18.2 % — ABNORMAL HIGH (ref 11.5–15.5)
WBC: <0.1 10*3/uL — CL (ref 4.0–10.5)
WBC: <0.1 10*3/uL — CL (ref 4.0–10.5)
nRBC: 0 % (ref 0.0–0.2)
nRBC: 0 % (ref 0.0–0.2)

## 2022-02-19 LAB — COMPREHENSIVE METABOLIC PANEL
ALT: 23 U/L (ref 0–44)
AST: 31 U/L (ref 15–41)
Albumin: 2.1 g/dL — ABNORMAL LOW (ref 3.5–5.0)
Alkaline Phosphatase: 40 U/L (ref 38–126)
Anion gap: 9 (ref 5–15)
BUN: 21 mg/dL (ref 8–23)
CO2: 20 mmol/L — ABNORMAL LOW (ref 22–32)
Calcium: 7.6 mg/dL — ABNORMAL LOW (ref 8.9–10.3)
Chloride: 101 mmol/L (ref 98–111)
Creatinine, Ser: 1.3 mg/dL — ABNORMAL HIGH (ref 0.44–1.00)
GFR, Estimated: 46 mL/min — ABNORMAL LOW (ref >60)
Glucose, Bld: 108 mg/dL — ABNORMAL HIGH (ref 70–99)
Potassium: 3.2 mmol/L — ABNORMAL LOW (ref 3.5–5.1)
Sodium: 130 mmol/L — ABNORMAL LOW (ref 135–145)
Total Bilirubin: 1.8 mg/dL — ABNORMAL HIGH (ref 0.3–1.2)
Total Protein: 5.4 g/dL — ABNORMAL LOW (ref 6.5–8.1)

## 2022-02-19 LAB — PREPARE PLATELET PHERESIS
Unit division: 0
Unit division: 0

## 2022-02-19 LAB — GLUCOSE, CAPILLARY
Glucose-Capillary: 100 mg/dL — ABNORMAL HIGH (ref 70–99)
Glucose-Capillary: 131 mg/dL — ABNORMAL HIGH (ref 70–99)
Glucose-Capillary: 181 mg/dL — ABNORMAL HIGH (ref 70–99)
Glucose-Capillary: 138 mg/dL — ABNORMAL HIGH (ref 70–99)

## 2022-02-19 LAB — MAGNESIUM: Magnesium: 1.6 mg/dL — ABNORMAL LOW (ref 1.7–2.4)

## 2022-02-19 LAB — ABO/RH: ABO/RH(D): A POS

## 2022-02-19 LAB — TRANSFUSION REACTION
DAT C3: NEGATIVE
Post RXN DAT IgG: NEGATIVE

## 2022-02-19 LAB — BRAIN NATRIURETIC PEPTIDE: B Natriuretic Peptide: 499.1 pg/mL — ABNORMAL HIGH (ref 0.0–100.0)

## 2022-02-19 LAB — PHOSPHORUS: Phosphorus: 3.1 mg/dL (ref 2.5–4.6)

## 2022-02-19 MED ORDER — ACETAMINOPHEN 325 MG PO TABS
650.0000 mg | ORAL_TABLET | Freq: Four times a day (QID) | ORAL | Status: DC | PRN
  Administered 2022-02-23: 650 mg via ORAL
  Filled 2022-02-19: qty 2

## 2022-02-19 MED ORDER — GLUCERNA SHAKE PO LIQD
237.0000 mL | Freq: Two times a day (BID) | ORAL | Status: DC
  Administered 2022-02-22: 237 mL via ORAL

## 2022-02-19 MED ORDER — SODIUM CHLORIDE 0.9% IV SOLUTION: Freq: Once | INTRAVENOUS | Status: AC

## 2022-02-19 MED ORDER — IOHEXOL 350 MG/ML SOLN
50.0000 mL | Freq: Once | INTRAVENOUS | Status: AC | PRN
  Administered 2022-02-19: 50 mL via INTRAVENOUS

## 2022-02-19 MED ORDER — MAGNESIUM SULFATE 2 GM/50ML IV SOLN
2.0000 g | Freq: Once | INTRAVENOUS | Status: AC
  Administered 2022-02-19: 2 g via INTRAVENOUS
  Filled 2022-02-19: qty 50

## 2022-02-19 MED ORDER — ADULT MULTIVITAMIN W/MINERALS CH
1.0000 | ORAL_TABLET | Freq: Every day | ORAL | Status: DC
  Administered 2022-02-19 – 2022-02-23 (×5): 1 via ORAL
  Filled 2022-02-19 (×5): qty 1

## 2022-02-19 MED ORDER — DIGOXIN 0.25 MG/ML IJ SOLN
0.2500 mg | Freq: Once | INTRAMUSCULAR | Status: AC
  Administered 2022-02-19: 0.25 mg via INTRAVENOUS
  Filled 2022-02-19: qty 1

## 2022-02-19 MED ORDER — POTASSIUM CHLORIDE CRYS ER 20 MEQ PO TBCR
40.0000 meq | EXTENDED_RELEASE_TABLET | ORAL | Status: AC
  Administered 2022-02-19 (×2): 40 meq via ORAL
  Filled 2022-02-19 (×2): qty 2

## 2022-02-19 MED ORDER — METOPROLOL TARTRATE 5 MG/5ML IV SOLN
5.0000 mg | Freq: Once | INTRAVENOUS | Status: AC
  Administered 2022-02-19: 5 mg via INTRAVENOUS
  Filled 2022-02-19: qty 5

## 2022-02-19 MED ORDER — DILTIAZEM HCL 60 MG PO TABS
30.0000 mg | ORAL_TABLET | Freq: Three times a day (TID) | ORAL | Status: DC
  Administered 2022-02-19: 30 mg via ORAL
  Filled 2022-02-19: qty 1

## 2022-02-19 MED ORDER — DIGOXIN 0.25 MG/ML IJ SOLN
0.1250 mg | Freq: Once | INTRAMUSCULAR | Status: AC
  Administered 2022-02-19: 0.125 mg via INTRAVENOUS
  Filled 2022-02-19: qty 0.5

## 2022-02-19 MED ORDER — ACETAMINOPHEN 500 MG PO TABS
1000.0000 mg | ORAL_TABLET | Freq: Three times a day (TID) | ORAL | Status: AC
  Administered 2022-02-19 – 2022-02-22 (×8): 1000 mg via ORAL
  Filled 2022-02-19 (×9): qty 2

## 2022-02-19 MED ORDER — LACTATED RINGERS IV BOLUS
500.0000 mL | Freq: Once | INTRAVENOUS | Status: AC
  Administered 2022-02-19: 500 mL via INTRAVENOUS

## 2022-02-19 NOTE — Progress Notes (Signed)
Crosley, MD notified of soft BP: 83/60 (69) post-PRBC transfusion. Pt A&Ox4 w/ adequate UOP. Orders given for 500cc bolus over 2 hours and STAT CBC.

## 2022-02-19 NOTE — Progress Notes (Addendum)
Notified MD of increased pt HR after returning from CT scan, pt in Afib RVR w/ rate in the 150s, BP stable. MD also notified of AM K+:3.2. Orders placed for PO K+ replacement and to give '5mg'$  IV metoprolol. Pt had good response to metoprolol w/ rate now in the 120s and no drop in BP.   0530: Reached out to MD to clarify 500cc LR bolus. Updated order to only administer bolus if there is drop in BP.   0604: MD notified that HR back into 140s, BP stable. Orders to give additional '5mg'$  IV metoprolol, '30mg'$  PO cardizem, and to administer 500cc LR bolus.

## 2022-02-19 NOTE — Progress Notes (Signed)
PROGRESS NOTE    [name]  WGN:[id number] DOB: [birth year] DOA: 02/02/2022 PCP: Ledell Noss[facility]  Chief Complaint  Patient presents with   Weakness    Brief Narrative:  Kaitlyn Hull is Kaitlyn Hull 66 year old female with Emiliya Chretien history of chronic atrial fibrillation, diabetes mellitus type 2, hypertension, AML morbid obesity presenting with 2-day history of generalized weakness, decreased oral intake, and falling.  The patient denies any fevers or chills.  She has had some nausea without any emesis.  She denies any diarrhea.  She denies any chest pain or shortness of breath or palpitations.  She has not had any dizziness or syncope.  She denies hitting her head.  She denies any pain at this time. She denies any hemoptysis, hematemesis, hematochezia, melena.  She had 1 episode of emesis on the morning of admission but denies any abdominal pain, dysuria, hematemesis.  There is no coffee-ground emesis.  She denies any coughing or sputum production.  She has not had any fevers or chills. She states that she has been compliant with all her medications except up to the last 2 days.  Because she was feeling nauseous and poorly, she decided not to take any of her medications including her apixaban. In the ED, the patient was afebrile and hemodynamically stable.  She was initially hypotensive with Jagdeep Ancheta blood pressure of 89/60.  She improved with IV fluids.  She was started on diltiazem drip.  Cardiology was consulted by EDP and recommended transfer to [facility].  Assessment & Plan:   Principal Problem:   Atrial fibrillation with tachycardic ventricular rate (HCC) Active Problems:   Acute Myeloid Leukemia/Abnormal blood smear/   Neutropenia (HCC)   Morbid obesity due to excess calories (HCC)   Chronic atrial fibrillation (HCC)   Acute myeloid leukemia in adult [facility])   Neutropenic fever (HCC)   Sepsis ([place])   Bacteremia  Sepsis due to Staph Bacteremia Left Sided Parotitis and Cellulitis of Left  Sided of Face PICC line in place Febrile Neutropenia Sepsis ruled in with fever, neutropenia, tachycardia, tachypnea Workup thus far revealing for L neck skin soft tissue infection, no clear fluctuance on exam, lower suspicion for abscess 12/16 blood cultures with staph aureus (BCID without resistance gene detected) Repeat cultures from 12/18 pending Line holiday, PICC line removed 12/17 UA with nitrite, many bacteria -> follow urine culture CXR concerning for multifocal pneumonia CT neck notable for parotitis  Echocardiogram with EF 50-55%, no RWMA No TEE right now due to thrombocytopenia Will consult ID and follow recommendations Ancef  Acute Myeloid Leukemia Pancytopenia Comanaged at [facility] by Dr. Jerrye Noble and AP with Dr. Delton Coombes Receiving venetoclax/decitabine (last cycle started 11/27).  Currently holding venetoclax. S/p 4 units platelets S/p 2 units pRBC's Granix x1 was ordered by Dr. Claria Dice  Appreciate assistance from Dr. Chryl Heck  Use leukocyte reduced and irradiated blood products  Transfuse for Hb <8 and platelets <20   Abnormal Chest Imaging Concerning for multifocal pneumonia, on abx as above Follow volume status, needs repeat chest imaging outpatient Consider lasix for overload/edema, as no obvious overload and soft BP at times   Atrial Fibrillation with RVR Related to above BP on soft side, continue metoprolol as tolerated Continue amiodarone per cardiology.  Dig load per cards.  Acute Kidney Injury Due to sepsis Follow with management Improving UA with 30 mg/dl protein, 0-5 RBC  Hypokalemia Replace and follow  T2DM Metformin currently on hold A1c from 10/2021 6.2 SSI   Obesity Body mass  index is 45.47 kg/m.    DVT prophylaxis: SCD's Code Status: full Family Communication: none at bedside Disposition:   Status is: Inpatient Remains inpatient appropriate because: continued need for inpatient care with febrile neutropenia, RVR    Consultants:  Cardiology hematology  Procedures:  none  Antimicrobials:  Anti-infectives (From admission, onward)    Start     Dose/Rate Route Frequency Ordered Stop   02/19/22 1800  vancomycin (VANCOREADY) IVPB 1500 mg/300 mL  Status:  Discontinued        1,500 mg 150 mL/hr over 120 Minutes Intravenous Every 48 hours 02/06/2022 1811 02/18/22 1344   02/18/22 1500  ceFAZolin (ANCEF) IVPB 2g/100 mL premix        2 g 200 mL/hr over 30 Minutes Intravenous Every 8 hours 02/18/22 1401     02/18/22 1400  metroNIDAZOLE (FLAGYL) tablet 500 mg  Status:  Discontinued        500 mg Oral Every 8 hours 02/18/22 0926 02/18/22 1344   02/18/22 0600  ceFEPIme (MAXIPIME) 2 g in sodium chloride 0.9 % 100 mL IVPB  Status:  Discontinued        2 g 200 mL/hr over 30 Minutes Intravenous Every 12 hours 02/16/2022 1811 02/18/22 1344   02/25/2022 1515  vancomycin (VANCOCIN) 2,250 mg in sodium chloride 0.9 % 500 mL IVPB        2,250 mg 261.3 mL/hr over 120 Minutes Intravenous  Once 03/01/2022 1509 02/05/2022 2133   02/27/2022 1515  ceFEPIme (MAXIPIME) 2 g in sodium chloride 0.9 % 100 mL IVPB        2 g 200 mL/hr over 30 Minutes Intravenous  Once 03/03/2022 1509 02/19/2022 2229      Subjective: Feels poorly in general  Objective: Vitals:   02/19/22 1240 02/19/22 1310 02/19/22 1340 02/19/22 1430  BP: 117/84 108/76 111/73 114/77  Pulse: (!) 149 (!) 146 (!) 141 (!) 140  Resp:    14  Temp:   100 F (37.8 C)   TempSrc:    Oral  SpO2: 99% 99% 98% 99%  Weight:      Height:        Intake/Output Summary (Last 24 hours) at 02/19/2022 1537 Last data filed at 02/19/2022 1430 Gross per 24 hour  Intake 3085.25 ml  Output 450 ml  Net 2635.25 ml   Filed Weights   02/04/2022 0842 02/15/2022 2356  Weight: 117 kg 112.8 kg    Examination:  General: No acute distress. Cardiovascular: irregularly irregular, tachy Lungs: unlabored Abdomen: Soft, nontender, nondistended Neurological: Alert and oriented 3. Moves all  extremities 4 with equal strength. Cranial nerves II through XII grossly intact. Extremities: No clubbing or cyanosis. No edema.  Data Reviewed: I have personally reviewed following labs and imaging studies  CBC: Recent Labs  Lab 02/15/2022 0845 02/18/22 0430 02/18/22 1035 02/18/22 2343 02/19/22 0347  WBC 0.1* DUPL <0.1* <0.1* <0.1*  NEUTROABS 0.0* PENDING  --   --   --   HGB 7.9* DUPL 6.6* 7.8* 7.9*  HCT 23.0* DUPL 19.9* 22.3* 23.3*  MCV 88.8 DUPL 90.9 88.5 90.3  PLT 7* DUPL 17* 13* 12*    Basic Metabolic Panel: Recent Labs  Lab 02/25/2022 0845 02/09/2022 0902 02/18/22 0645 02/18/22 0646 02/18/22 1035 02/19/22 0347  NA 130*  --  136  --   --  130*  K 3.8  --  5.7*  --  4.1 3.2*  CL 98  --  108  --   --  101  CO2 21*  --  16*  --   --  20*  GLUCOSE 223*  --  172*  --   --  108*  BUN 27*  --  24*  --   --  21  CREATININE 1.61*  --  1.36*  --   --  1.30*  CALCIUM 8.9  --  8.3*  --   --  7.6*  MG  --  0.7*  --  1.7  --  1.6*  PHOS  --   --   --   --   --  3.1    GFR: Estimated Creatinine Clearance: 51.2 mL/min (Janat Tabbert) (by C-G formula based on SCr of 1.3 mg/dL (H)).  Liver Function Tests: Recent Labs  Lab 02/20/2022 0845 02/19/22 0347  AST 34 31  ALT 19 23  ALKPHOS 68 40  BILITOT 3.1* 1.8*  PROT 7.3 5.4*  ALBUMIN 3.3* 2.1*    CBG: Recent Labs  Lab 02/18/22 1113 02/18/22 1605 02/18/22 2103 02/19/22 0749 02/19/22 1144  GLUCAP 138* 133* 97 100* 131*     Recent Results (from the past 240 hour(s))  Resp panel by RT-PCR (RSV, Flu Kadrian Partch&B, Covid) Anterior Nasal Swab     Status: None   Collection Time: 03/01/2022  8:59 AM   Specimen: Anterior Nasal Swab  Result Value Ref Range Status   SARS Coronavirus 2 by RT PCR NEGATIVE NEGATIVE Final    Comment: (NOTE) SARS-CoV-2 target nucleic acids are NOT DETECTED.  The SARS-CoV-2 RNA is generally detectable in upper respiratory specimens during the acute phase of infection. The lowest concentration of SARS-CoV-2 viral  copies this assay can detect is 138 copies/mL. Brendan Gruwell negative result does not preclude SARS-Cov-2 infection and should not be used as the sole basis for treatment or other patient management decisions. Romir Klimowicz negative result may occur with  improper specimen collection/handling, submission of specimen other than nasopharyngeal swab, presence of viral mutation(s) within the areas targeted by this assay, and inadequate number of viral copies(<138 copies/mL). Wateen Varon negative result must be combined with clinical observations, patient history, and epidemiological information. The expected result is Negative.  Fact Sheet for Patients:  EntrepreneurPulse.com.au  Fact Sheet for Healthcare Providers:  IncredibleEmployment.be  This test is no t yet approved or cleared by the [place] FDA and  has been authorized for detection and/or diagnosis of SARS-CoV-2 by FDA under an Emergency Use Authorization (EUA). This EUA will remain  in effect (meaning this test can be used) for the duration of the COVID-19 declaration under Section 564(b)(1) of the Act, 21 U.S.C.section 360bbb-3(b)(1), unless the authorization is terminated  or revoked sooner.       Influenza Renada Cronin by PCR NEGATIVE NEGATIVE Final   Influenza B by PCR NEGATIVE NEGATIVE Final    Comment: (NOTE) The Xpert Xpress SARS-CoV-2/FLU/RSV plus assay is intended as an aid in the diagnosis of influenza from Nasopharyngeal swab specimens and should not be used as Reynoldo Mainer sole basis for treatment. Nasal washings and aspirates are unacceptable for Xpert Xpress SARS-CoV-2/FLU/RSV testing.  Fact Sheet for Patients: EntrepreneurPulse.com.au  Fact Sheet for Healthcare Providers: IncredibleEmployment.be  This test is not yet approved or cleared by the [place] FDA and has been authorized for detection and/or diagnosis of SARS-CoV-2 by FDA under an Emergency Use Authorization (EUA). This  EUA will remain in effect (meaning this test can be used) for the duration of the COVID-19 declaration under Section 564(b)(1) of the Act, 21 U.S.C. section 360bbb-3(b)(1), unless the authorization is terminated  or revoked.     Resp Syncytial Virus by PCR NEGATIVE NEGATIVE Final    Comment: (NOTE) Fact Sheet for Patients: EntrepreneurPulse.com.au  Fact Sheet for Healthcare Providers: IncredibleEmployment.be  This test is not yet approved or cleared by the [place] FDA and has been authorized for detection and/or diagnosis of SARS-CoV-2 by FDA under an Emergency Use Authorization (EUA). This EUA will remain in effect (meaning this test can be used) for the duration of the COVID-19 declaration under Section 564(b)(1) of the Act, 21 U.S.C. section 360bbb-3(b)(1), unless the authorization is terminated or revoked.  Performed at [facility], [address], [place], [place] [address]   Blood culture (routine x 2)     Status: Abnormal (Preliminary result)   Collection Time: 02/06/2022  3:19 PM   Specimen: BLOOD  Result Value Ref Range Status   Specimen Description BLOOD SITE NOT SPECIFIED  Final   Special Requests   Final    BOTTLES DRAWN AEROBIC AND ANAEROBIC Blood Culture results may not be optimal due to an inadequate volume of blood received in culture bottles   Culture  Setup Time   Final    GRAM POSITIVE COCCI IN BOTH AEROBIC AND ANAEROBIC BOTTLES CRITICAL VALUE NOTED.  VALUE IS CONSISTENT WITH PREVIOUSLY REPORTED AND CALLED VALUE. Performed at [facility], [place] [address], [place], [place] [address]    Culture STAPHYLOCOCCUS AUREUS (Matheson Vandehei)  Final   Report Status PENDING  Incomplete  Blood culture (routine x 2)     Status: Abnormal (Preliminary result)   Collection Time: 02/18/2022  3:24 PM   Specimen: BLOOD  Result Value Ref Range Status   Specimen Description BLOOD SITE NOT SPECIFIED  Final   Special Requests   Final    BOTTLES  DRAWN AEROBIC AND ANAEROBIC Blood Culture adequate volume   Culture  Setup Time   Final    GRAM POSITIVE COCCI IN PAIRS IN CLUSTERS IN BOTH AEROBIC AND ANAEROBIC BOTTLES CRITICAL RESULT CALLED TO, READ BACK BY AND VERIFIED WITH: PHARMD [email or web address] AT 8242 ON 02/18/2022 BY T.SAAD.    Culture (Nashonda Limberg)  Final    STAPHYLOCOCCUS AUREUS SUSCEPTIBILITIES TO FOLLOW Performed at [facility], [place] [address] [place],  [address]    Report Status PENDING  Incomplete  Blood Culture ID Panel (Reflexed)     Status: Abnormal   Collection Time: 02/13/2022  3:24 PM  Result Value Ref Range Status   Enterococcus faecalis NOT DETECTED NOT DETECTED Final   Enterococcus Faecium NOT DETECTED NOT DETECTED Final   Listeria monocytogenes NOT DETECTED NOT DETECTED Final   Staphylococcus species DETECTED (Jordyan Hardiman) NOT DETECTED Final    Comment: CRITICAL RESULT CALLED TO, READ BACK BY AND VERIFIED WITH: PHARMD [email or web address] AT 4431 ON 02/18/2022 BY T.SAAD.    Staphylococcus aureus (BCID) DETECTED (Jersee Winiarski) NOT DETECTED Final    Comment: CRITICAL RESULT CALLED TO, READ BACK BY AND VERIFIED WITH: PHARMD [email or web address] AT 5400 ON 02/18/2022 BY T.SAAD.    Staphylococcus epidermidis NOT DETECTED NOT DETECTED Final   Staphylococcus lugdunensis NOT DETECTED NOT DETECTED Final   Streptococcus species NOT DETECTED NOT DETECTED Final   Streptococcus agalactiae NOT DETECTED NOT DETECTED Final   Streptococcus pneumoniae NOT DETECTED NOT DETECTED Final   Streptococcus pyogenes NOT DETECTED NOT DETECTED Final   Telena Peyser.calcoaceticus-baumannii NOT DETECTED NOT DETECTED Final   Bacteroides fragilis NOT DETECTED NOT DETECTED Final   Enterobacterales NOT DETECTED NOT DETECTED Final   Enterobacter cloacae complex NOT DETECTED NOT DETECTED Final  Escherichia coli NOT DETECTED NOT DETECTED Final   Klebsiella aerogenes NOT DETECTED NOT DETECTED Final   Klebsiella oxytoca NOT DETECTED NOT DETECTED Final   Klebsiella pneumoniae NOT DETECTED NOT DETECTED  Final   Proteus species NOT DETECTED NOT DETECTED Final   Salmonella species NOT DETECTED NOT DETECTED Final   Serratia marcescens NOT DETECTED NOT DETECTED Final   Haemophilus influenzae NOT DETECTED NOT DETECTED Final   Neisseria meningitidis NOT DETECTED NOT DETECTED Final   Pseudomonas aeruginosa NOT DETECTED NOT DETECTED Final   Stenotrophomonas maltophilia NOT DETECTED NOT DETECTED Final   Candida albicans NOT DETECTED NOT DETECTED Final   Candida auris NOT DETECTED NOT DETECTED Final   Candida glabrata NOT DETECTED NOT DETECTED Final   Candida krusei NOT DETECTED NOT DETECTED Final   Candida parapsilosis NOT DETECTED NOT DETECTED Final   Candida tropicalis NOT DETECTED NOT DETECTED Final   Cryptococcus neoformans/gattii NOT DETECTED NOT DETECTED Final   Meth resistant mecA/C and MREJ NOT DETECTED NOT DETECTED Final    Comment: Performed at [facility], [place] [address] [place], [place] [address]  Culture, body fluid w Gram Stain-bottle     Status: None (Preliminary result)   Collection Time: 02/26/2022  6:25 PM   Specimen: Fluid  Result Value Ref Range Status   Specimen Description FLUID  Final   Special Requests   Final    BOTTLES DRAWN AEROBIC AND ANAEROBIC Blood Culture results may not be optimal due to an excessive volume of blood received in culture bottles   Culture   Final    NO GROWTH 2 DAYS Performed at [facility], [place] [address] [place], [place] [address]    Report Status PENDING  Incomplete  Gram stain     Status: None   Collection Time: 02/05/2022  6:26 PM   Specimen: Fluid  Result Value Ref Range Status   Specimen Description FLUID  Final   Special Requests NONE  Final   Gram Stain   Final    NO ORGANISMS SEEN NO WBC SEEN Performed at [facility], [place] [address], [place], [place] [address]    Report Status 02/16/2022 FINAL  Final  Respiratory (~20 pathogens) panel by PCR     Status: None   Collection Time: 02/18/22 10:58 AM   Specimen:  Nasopharyngeal Swab; Respiratory  Result Value Ref Range Status   Adenovirus NOT DETECTED NOT DETECTED Final   Coronavirus 229E NOT DETECTED NOT DETECTED Final    Comment: (NOTE) The Coronavirus on the Respiratory Panel, DOES NOT test for the novel  Coronavirus (2019 nCoV)    Coronavirus HKU1 NOT DETECTED NOT DETECTED Final   Coronavirus NL63 NOT DETECTED NOT DETECTED Final   Coronavirus OC43 NOT DETECTED NOT DETECTED Final   Metapneumovirus NOT DETECTED NOT DETECTED Final   Rhinovirus / Enterovirus NOT DETECTED NOT DETECTED Final   Influenza Lonney Revak NOT DETECTED NOT DETECTED Final   Influenza B NOT DETECTED NOT DETECTED Final   Parainfluenza Virus 1 NOT DETECTED NOT DETECTED Final   Parainfluenza Virus 2 NOT DETECTED NOT DETECTED Final   Parainfluenza Virus 3 NOT DETECTED NOT DETECTED Final   Parainfluenza Virus 4 NOT DETECTED NOT DETECTED Final   Respiratory Syncytial Virus NOT DETECTED NOT DETECTED Final   Bordetella pertussis NOT DETECTED NOT DETECTED Final   Bordetella Parapertussis NOT DETECTED NOT DETECTED Final   Chlamydophila pneumoniae NOT DETECTED NOT DETECTED Final   Mycoplasma pneumoniae NOT DETECTED NOT DETECTED Final  Comment: Performed at [facility], [place] [address] [place], [place] [address]         Radiology Studies: DG CHEST PORT 1 VIEW  Result Date: 02/19/2022 CLINICAL DATA:  Airspace disease on neck CT. EXAM: PORTABLE CHEST 1 VIEW COMPARISON:  02/09/2022 FINDINGS: 0803 hours. Scattered areas of ill-defined patchy and nodular airspace disease noted bilaterally. No edema or substantial effusion. The cardio pericardial silhouette is enlarged. Telemetry leads overlie the chest. IMPRESSION: Scattered areas of ill-defined patchy and nodular airspace disease bilaterally. Imaging features raise concern for multifocal pneumonia. Follow-up recommended to ensure resolution. Electronically Signed   By: Misty Stanley M.D.   On: 02/19/2022 08:40   CT SOFT TISSUE NECK W  CONTRAST  Result Date: 02/19/2022 CLINICAL DATA:  66 year old female with left facial swelling and redness. Recent fall. EXAM: CT NECK WITH CONTRAST TECHNIQUE: Multidetector CT imaging of the neck was performed using the standard protocol following the bolus administration of intravenous contrast. RADIATION DOSE REDUCTION: This exam was performed according to the departmental dose-optimization program which includes automated exposure control, adjustment of the mA and/or kV according to patient size and/or use of iterative reconstruction technique. CONTRAST:  51m OMNIPAQUE IOHEXOL 350 MG/ML SOLN COMPARISON:  Head CT 02/13/2022. CTA chest 12/02/2021. FINDINGS: Pharynx and larynx: Glottis is closed. And the nasopharynx is effaced at the level of the soft palate. But otherwise laryngeal and pharyngeal soft tissue contours are within normal limits. Parapharyngeal and retropharyngeal spaces appear to remain normal. Salivary glands: Negative sublingual space. Submandibular glands appear symmetric and within normal limits. The right parotid gland appears normal. But there is asymmetric heterogeneous increased density in the left parotid gland (series 3, image 43 and see coronal image 73) no discrete parotid mass is associated. Soft tissue at the left stylomastoid foramen appears to remain normal. The deep lobe of the gland has Bartley Vuolo more normal appearance. There does appear to be mild overlying subcutaneous soft tissue stranding. No sialolithiasis.  Left parotid duct does not appear enlarged. Thyroid: Negative. Lymph nodes: No cervical lymphadenopathy. However, left level 2 lymph nodes are larger than those on the right (only 6 mm short axis) and might be reactive. Vascular: Suboptimal intravascular contrast bolus. But the major vascular structures in the neck and at the skull base appear to remain patent. Tortuous carotid arteries each with Saman Umstead partially retropharyngeal course. Bilateral carotid bifurcation calcified  atherosclerosis. Limited intracranial: Negative. Visualized orbits: Negative. Mastoids and visualized paranasal sinuses: Visualized paranasal sinuses and mastoids are clear. Tympanic cavities are clear. Skeleton: Mandible intact and normally located. Cervical spine disc and endplate degeneration. No acute osseous abnormality identified. Medial, superior left breast soft tissue thickening and architectural distortion on series 3, image 131 appears stable since September, with other postoperative changes to the left breast visible at that time. Upper chest: Multifocal Patchy and confluent scattered bilateral upper lobe pulmonary opacity. This is new from the September CTA. Visible major airways remain patent. No pleural effusion identified. Calcified aortic atherosclerosis. Visible superior mediastinal lymph nodes remain within normal limits. IMPRESSION: 1. Asymmetric density and heterogeneity of the left parotid gland compatible with Acute Parotitis, or possibly parotid injury in the setting of fall. No complicating features. 2. Multifocal bilateral upper lobe pulmonary opacity is new from Shirel Mallis September CTA and appears infectious/inflammatory. Consider Bilateral Pneumonia including viral/atypical etiologies. Electronically Signed   By: HGenevie AnnM.D.   On: 02/19/2022 06:09   ECHOCARDIOGRAM COMPLETE  Result Date: 02/18/2022    ECHOCARDIOGRAM REPORT   Patient  Name:   TARHONDA HOLLENBERG [facility] Date of Exam: 02/18/2022 Medical Rec #:  [id number]     Height:       62.0 in Accession #:    [id number]    Weight:       248.6 lb Date of Birth:  08-25-1955    BSA:          2.097 m Patient Age:    49 years      BP:           103/37 mmHg Patient Gender: F             HR:           124 bpm. Exam Location:  Inpatient Procedure: 2D Echo, Cardiac Doppler and Color Doppler Indications:    Atrial Fibrillation I48.91  History:        Patient has prior history of Echocardiogram examinations, most                 recent 07/18/2021. Arrythmias:Atrial  Fibrillation and                 Tachycardia; Risk Factors:Diabetes, Hypertension and Former                 Smoker.  Sonographer:    Greer Pickerel Referring Phys: [id number] [place] Shi  Sonographer Comments: Image acquisition challenging due to patient body habitus and Image acquisition challenging due to respiratory motion. IMPRESSIONS  1. Left ventricular ejection fraction, by estimation, is 50 to 55%. The left ventricle has low normal function. The left ventricle has no regional wall motion abnormalities. Left ventricular diastolic parameters are indeterminate.  2. Right ventricular systolic function is normal. The right ventricular size is normal. There is normal pulmonary artery systolic pressure.  3. The mitral valve was not well visualized. Moderate mitral valve regurgitation.  4. The aortic valve is tricuspid. There is mild calcification of the aortic valve. There is mild thickening of the aortic valve. Aortic valve regurgitation is not visualized.  5. The inferior vena cava is normal in size with greater than 50% respiratory variability, suggesting right atrial pressure of 3 mmHg. Comparison(s): Prior images reviewed side by side. Increase in mitral regurgitation. FINDINGS  Left Ventricle: Left ventricular ejection fraction, by estimation, is 50 to 55%. The left ventricle has low normal function. The left ventricle has no regional wall motion abnormalities. The left ventricular internal cavity size was normal in size. There is no left ventricular hypertrophy. Left ventricular diastolic parameters are indeterminate. Right Ventricle: The right ventricular size is normal. No increase in right ventricular wall thickness. Right ventricular systolic function is normal. There is normal pulmonary artery systolic pressure. The tricuspid regurgitant velocity is 1.54 m/s, and  with an assumed right atrial pressure of 3 mmHg, the estimated right ventricular systolic pressure is 10.9 mmHg. Left Atrium: Left atrial  size was normal in size. Right Atrium: Right atrial size was normal in size. Pericardium: Trivial pericardial effusion is present. Presence of epicardial fat layer. Mitral Valve: The mitral valve was not well visualized. Moderate mitral valve regurgitation. Tricuspid Valve: The tricuspid valve is normal in structure. Tricuspid valve regurgitation is not demonstrated. No evidence of tricuspid stenosis. Aortic Valve: The aortic valve is tricuspid. There is mild calcification of the aortic valve. There is mild thickening of the aortic valve. Aortic valve regurgitation is not visualized. Pulmonic Valve: The pulmonic valve was grossly normal. Pulmonic valve regurgitation is not visualized. No evidence of pulmonic stenosis. Aorta: The aortic  root and ascending aorta are structurally normal, with no evidence of dilitation. Venous: The inferior vena cava is normal in size with greater than 50% respiratory variability, suggesting right atrial pressure of 3 mmHg. IAS/Shunts: No atrial level shunt detected by color flow Doppler.  LEFT VENTRICLE PLAX 2D LVIDd:         4.40 cm   Diastology LVIDs:         3.20 cm   LV e' medial:    7.72 cm/s LV PW:         1.30 cm   LV E/e' medial:  13.3 LV IVS:        1.00 cm   LV e' lateral:   11.30 cm/s LVOT diam:     1.60 cm   LV E/e' lateral: 9.1 LV SV:         26 LV SV Index:   12 LVOT Area:     2.01 cm  RIGHT VENTRICLE RV S prime:     12.40 cm/s TAPSE (M-mode): 1.3 cm LEFT ATRIUM             Index        RIGHT ATRIUM           Index LA diam:        4.30 cm 2.05 cm/m   RA Area:     17.40 cm LA Vol (A2C):   63.7 ml 30.38 ml/m  RA Volume:   47.90 ml  22.84 ml/m LA Vol (A4C):   54.3 ml 25.90 ml/m LA Biplane Vol: 62.2 ml 29.66 ml/m  AORTIC VALVE LVOT Vmax:   99.00 cm/s LVOT Vmean:  63.900 cm/s LVOT VTI:    0.127 m  AORTA Ao Root diam: 3.40 cm Ao Asc diam:  3.40 cm MITRAL VALVE                TRICUSPID VALVE MV Area (PHT): 9.60 cm     TR Peak grad:   9.5 mmHg MV Decel Time: 79 msec       TR Vmax:        154.00 cm/s MR Peak grad: 50.8 mmHg MR Vmax:      356.50 cm/s   SHUNTS MV E velocity: 103.00 cm/s  Systemic VTI:  0.13 m MV Tasnia Spegal velocity: 70.40 cm/s   Systemic Diam: 1.60 cm MV E/Emani Taussig ratio:  1.46 Rudean Haskell MD Electronically signed by Rudean Haskell MD Signature Date/Time: 02/18/2022/6:16:34 PM    Final         Scheduled Meds:  sodium chloride   Intravenous Once   sodium chloride   Intravenous Once   acetaminophen  1,000 mg Oral Q8H   Chlorhexidine Gluconate Cloth  6 each Topical Daily   digoxin  0.125 mg Intravenous Once   digoxin  0.125 mg Intravenous Once   insulin aspart  0-15 Units Subcutaneous TID WC   magnesium oxide  400 mg Oral BID   metFORMIN  1,000 mg Oral Q supper   pravastatin  40 mg Oral q1800   Continuous Infusions:  sodium chloride 75 mL/hr at 02/19/22 0806   amiodarone 30 mg/hr (02/19/22 1439)    ceFAZolin (ANCEF) IV 2 g (02/19/22 1438)     LOS: 2 days    Time spent: over 30 min    Fayrene Helper, MD [facility]   To contact the attending provider between 7A-7P or the covering provider during after hours 7P-7A, please log into the web site [email or web address] and access using universal [place]  password for that web site. If you do not have the password, please call the hospital operator.  02/19/2022, 3:37 PM

## 2022-02-19 NOTE — Progress Notes (Signed)
Initial Nutrition Assessment  DOCUMENTATION CODES:   Morbid obesity  INTERVENTION:  - Continue current diet order.   - Add Glucerna Shake po BID, each supplement provides 220 kcal and 10 grams of protein   - Add MVI q day.   NUTRITION DIAGNOSIS:   Inadequate oral intake related to poor appetite, nausea as evidenced by per patient/family report.  GOAL:   Patient will meet greater than or equal to 90% of their needs  MONITOR:   PO intake, Supplement acceptance  REASON FOR ASSESSMENT:   Malnutrition Screening Tool    ASSESSMENT:   66 y.o. female admits related to weakness. PMH includes: chronic afib, DM, HTN. Pt is currently receiving medical management for afib with tachycardic ventricular rate.  Meds include: sliding scale insulin, mag-ox, metformin. Labs reviewed: Na low, K lo, Mg low.   Pt was being seen by another provider at time of assessment. Per H&P note, pt had been experiencing nausea for a couple of days PTA. Per record, pt has experienced a 7.9% wt loss in 2 months, which is significant. RD will add Glucerna BID for now and continue to monitor PO intakes.   NUTRITION - FOCUSED PHYSICAL EXAM:  Unable to assess, attempt at f/u.   Diet Order:   Diet Order             Diet heart healthy/carb modified Room service appropriate? Yes; Fluid consistency: Thin  Diet effective now                   EDUCATION NEEDS:   Not appropriate for education at this time  Skin:  Skin Assessment: Reviewed RN Assessment  Last BM:  02/19/22 - Type 7  Height:   Ht Readings from Last 1 Encounters:  02/28/2022 '5\' 2"'$  (1.575 m)    Weight:   Wt Readings from Last 1 Encounters:  02/05/2022 112.8 kg    Ideal Body Weight:     BMI:  Body mass index is 45.47 kg/m.  Estimated Nutritional Needs:   Kcal:  1690-2030 kcals  Protein:  85-100 gm  Fluid:  >/= 1.6 L  Thalia Bloodgood, RD, LDN, CNSC.

## 2022-02-19 NOTE — Progress Notes (Signed)
[facility]  Date of Admission:  02/11/2022       Abx: cefazolin  ASSESSMENT: 66 yo female aml on chemo (last dose decitabine 11/2021), chronic pancytopenia including severe neutropenia, admitted with mssa bacteremia with severe sepsis  She had a picc placed 11/2021 for chemo that was removed this admission  12/16 bcx mssa 12/18 bcx negative 12/17 respiratory viral pcr negative  12/16 "fluid cx" I am not clear what this is from and this was negative  Picc remove 12/17  She had an admission neck ct that showed left parotitis and b. She has no s/s of pneumonia.   Tte poor quality. Tee not able to be done due to severe thrombocytopenia  Fever curve had improved  Mssa bsi from line vs parotitis or from parotitis then seeding the line? She has non obvious metastatic bone/joint complication of mssa bacteremia. She does have some redness around the left neck ?from parotitis complication -- no obvious abscess  I think we need to get a better understanding of the pulm nodular opacities. While this potentially could be due to catheter related bsi and "septic emboli", given chronic severe neutropenia she is at risk for invasive mold infection    PLAN: Endemic fungi, aspergillus serology Chest ct Continue cefazolin. 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 [facility]   Neutropenic fever ([place])   Sepsis ([place])   Bacteremia   No Known  Allergies  Scheduled Meds:  sodium chloride   Intravenous Once   sodium chloride   Intravenous Once   acetaminophen  1,000 mg Oral Q8H   Chlorhexidine Gluconate Cloth  6 each Topical Daily   digoxin  0.125 mg Intravenous Once   [START ON 02/20/2022] feeding supplement (GLUCERNA SHAKE)  237 mL Oral BID BM   insulin aspart  0-15 Units Subcutaneous TID WC   magnesium oxide  400 mg Oral BID   metFORMIN  1,000 mg Oral Q supper   multivitamin with minerals  1 tablet Oral Daily   pravastatin  40 mg Oral q1800   Continuous Infusions:  sodium chloride Stopped (02/19/22 0808)   amiodarone 30 mg/hr (02/19/22 1800)    ceFAZolin (ANCEF) IV Stopped (02/19/22 1508)   PRN Meds:.acetaminophen **FOLLOWED BY** [START ON 02/22/2022] acetaminophen, albuterol, ALPRAZolam, metoprolol tartrate, ondansetron **OR** ondansetron (ZOFRAN) IV   SUBJECTIVE: Patient feels lousy still No focal pain No dyspnea/chest pain Fever curve improving No rash No headache No n/v No diarrhea  Review of Systems: ROS All other ROS was negative, except mentioned above     OBJECTIVE: Vitals:   02/19/22 1310 02/19/22 1340 02/19/22 1430 02/19/22 1541  BP: 108/76 111/73 114/77 (!) 115/91  Pulse: (!) 146 (!) 141 (!) 140 93  Resp:   14 16  Temp:  100 F (37.8 C) 100 F (37.8 C) 98.8 F (37.1 C)  TempSrc:   Oral Oral  SpO2: 99% 98% 99% 100%  Weight:      Height:  Body mass index is 45.47 kg/m.  Physical Exam  General/constitutional: ill appearing, no acute distress, conversant, pleasant HEENT: Normocephalic, PER, Conj Clear, EOMI, Oropharynx clear Neck supple CV: rrr no mrg Lungs: clear to auscultation, normal respiratory effort Abd: Soft, Nontender Ext: no edema Skin: scattered echymosis -- redness left face/neck  Neuro: nonfocal, generalized weakness MSK: no peripheral joint swelling/tenderness/warmth; back spines nontender    Lab Results Lab Results  Component Value Date   WBC <0.1 (LL)  02/19/2022   HGB 7.9 (L) 02/19/2022   HCT 23.3 (L) 02/19/2022   MCV 90.3 02/19/2022   PLT 12 (LL) 02/19/2022    Lab Results  Component Value Date   CREATININE 1.30 (H) 02/19/2022   BUN 21 02/19/2022   NA 130 (L) 02/19/2022   K 3.2 (L) 02/19/2022   CL 101 02/19/2022   CO2 20 (L) 02/19/2022    Lab Results  Component Value Date   ALT 23 02/19/2022   AST 31 02/19/2022   ALKPHOS 40 02/19/2022   BILITOT 1.8 (H) 02/19/2022      Microbiology: Recent Results (from the past 240 hour(s))  Resp panel by RT-PCR (RSV, Flu A&B, Covid) Anterior Nasal Swab     Status: None   Collection Time: 02/02/2022  8:59 AM   Specimen: Anterior Nasal Swab  Result Value Ref Range Status   SARS Coronavirus 2 by RT PCR NEGATIVE NEGATIVE Final    Comment: (NOTE) SARS-CoV-2 target nucleic acids are NOT DETECTED.  The SARS-CoV-2 RNA is generally detectable in upper respiratory specimens during the acute phase of infection. The lowest concentration of SARS-CoV-2 viral copies this assay can detect is 138 copies/mL. A negative result does not preclude SARS-Cov-2 infection and should not be used as the sole basis for treatment or other patient management decisions. A negative result may occur with  improper specimen collection/handling, submission of specimen other than nasopharyngeal swab, presence of viral mutation(s) within the areas targeted by this assay, and inadequate number of viral copies(<138 copies/mL). A negative result must be combined with clinical observations, patient history, and epidemiological information. The expected result is Negative.  Fact Sheet for Patients:  EntrepreneurPulse.com.au  Fact Sheet for Healthcare Providers:  IncredibleEmployment.be  This test is no t yet approved or cleared by the [place] FDA and  has been authorized for detection and/or diagnosis of SARS-CoV-2 by FDA under an Emergency Use Authorization (EUA). This EUA will  remain  in effect (meaning this test can be used) for the duration of the COVID-19 declaration under Section 564(b)(1) of the Act, 21 U.S.C.section 360bbb-3(b)(1), unless the authorization is terminated  or revoked sooner.       Influenza A by PCR NEGATIVE NEGATIVE Final   Influenza B by PCR NEGATIVE NEGATIVE Final    Comment: (NOTE) The Xpert Xpress SARS-CoV-2/FLU/RSV plus assay is intended as an aid in the diagnosis of influenza from Nasopharyngeal swab specimens and should not be used as a sole basis for treatment. Nasal washings and aspirates are unacceptable for Xpert Xpress SARS-CoV-2/FLU/RSV testing.  Fact Sheet for Patients: EntrepreneurPulse.com.au  Fact Sheet for Healthcare Providers: IncredibleEmployment.be  This test is not yet approved or cleared by the [place] FDA and has been authorized for detection and/or diagnosis of SARS-CoV-2 by FDA under an Emergency Use Authorization (EUA). This EUA will remain in effect (meaning this test can be used) for the duration of the COVID-19 declaration under Section 564(b)(1) of the Act, 21 U.S.C. section 360bbb-3(b)(1), unless the authorization is terminated or revoked.  Resp Syncytial Virus by PCR NEGATIVE NEGATIVE Final    Comment: (NOTE) Fact Sheet for Patients: EntrepreneurPulse.com.au  Fact Sheet for Healthcare Providers: IncredibleEmployment.be  This test is not yet approved or cleared by the [place] FDA and has been authorized for detection and/or diagnosis of SARS-CoV-2 by FDA under an Emergency Use Authorization (EUA). This EUA will remain in effect (meaning this test can be used) for the duration of the COVID-19 declaration under Section 564(b)(1) of the Act, 21 U.S.C. section 360bbb-3(b)(1), unless the authorization is terminated or revoked.  Performed at [facility], [address], [place], [place] [address]   Blood  culture (routine x 2)     Status: Abnormal (Preliminary result)   Collection Time: 02/21/2022  3:19 PM   Specimen: BLOOD  Result Value Ref Range Status   Specimen Description BLOOD SITE NOT SPECIFIED  Final   Special Requests   Final    BOTTLES DRAWN AEROBIC AND ANAEROBIC Blood Culture results may not be optimal due to an inadequate volume of blood received in culture bottles   Culture  Setup Time   Final    GRAM POSITIVE COCCI IN BOTH AEROBIC AND ANAEROBIC BOTTLES CRITICAL VALUE NOTED.  VALUE IS CONSISTENT WITH PREVIOUSLY REPORTED AND CALLED VALUE. Performed at [facility], [place] [address], [place], [place] [address]    Culture STAPHYLOCOCCUS AUREUS (A)  Final   Report Status PENDING  Incomplete  Blood culture (routine x 2)     Status: Abnormal (Preliminary result)   Collection Time: 02/19/2022  3:24 PM   Specimen: BLOOD  Result Value Ref Range Status   Specimen Description BLOOD SITE NOT SPECIFIED  Final   Special Requests   Final    BOTTLES DRAWN AEROBIC AND ANAEROBIC Blood Culture adequate volume   Culture  Setup Time   Final    GRAM POSITIVE COCCI IN PAIRS IN CLUSTERS IN BOTH AEROBIC AND ANAEROBIC BOTTLES CRITICAL RESULT CALLED TO, READ BACK BY AND VERIFIED WITH: PHARMD [email or web address] AT 8144 ON 02/18/2022 BY T.SAAD.    Culture (A)  Final    STAPHYLOCOCCUS AUREUS SUSCEPTIBILITIES TO FOLLOW Performed at [facility], [place] [address], [place], [place] [address]    Report Status PENDING  Incomplete  Blood Culture ID Panel (Reflexed)     Status: Abnormal   Collection Time: 03/01/2022  3:24 PM  Result Value Ref Range Status   Enterococcus faecalis NOT DETECTED NOT DETECTED Final   Enterococcus Faecium NOT DETECTED NOT DETECTED Final   Listeria monocytogenes NOT DETECTED NOT DETECTED Final   Staphylococcus species DETECTED (A) NOT DETECTED Final    Comment: CRITICAL RESULT CALLED TO, READ BACK BY AND VERIFIED WITH: PHARMD [email or web address] AT 3149 ON 02/18/2022 BY T.SAAD.     Staphylococcus aureus (BCID) DETECTED (A) NOT DETECTED Final    Comment: CRITICAL RESULT CALLED TO, READ BACK BY AND VERIFIED WITH: PHARMD [email or web address] AT 7026 ON 02/18/2022 BY T.SAAD.    Staphylococcus epidermidis NOT DETECTED NOT DETECTED Final   Staphylococcus lugdunensis NOT DETECTED NOT DETECTED Final   Streptococcus species NOT DETECTED NOT DETECTED Final   Streptococcus agalactiae NOT DETECTED NOT DETECTED Final   Streptococcus pneumoniae NOT DETECTED NOT DETECTED Final   Streptococcus pyogenes NOT DETECTED NOT DETECTED Final   A.calcoaceticus-baumannii NOT DETECTED NOT DETECTED Final   Bacteroides fragilis NOT DETECTED NOT DETECTED Final   Enterobacterales NOT DETECTED NOT DETECTED Final   Enterobacter cloacae complex NOT DETECTED NOT DETECTED Final   Escherichia coli NOT DETECTED NOT  DETECTED Final   Klebsiella aerogenes NOT DETECTED NOT DETECTED Final   Klebsiella oxytoca NOT DETECTED NOT DETECTED Final   Klebsiella pneumoniae NOT DETECTED NOT DETECTED Final   Proteus species NOT DETECTED NOT DETECTED Final   Salmonella species NOT DETECTED NOT DETECTED Final   Serratia marcescens NOT DETECTED NOT DETECTED Final   Haemophilus influenzae NOT DETECTED NOT DETECTED Final   Neisseria meningitidis NOT DETECTED NOT DETECTED Final   Pseudomonas aeruginosa NOT DETECTED NOT DETECTED Final   Stenotrophomonas maltophilia NOT DETECTED NOT DETECTED Final   Candida albicans NOT DETECTED NOT DETECTED Final   Candida auris NOT DETECTED NOT DETECTED Final   Candida glabrata NOT DETECTED NOT DETECTED Final   Candida krusei NOT DETECTED NOT DETECTED Final   Candida parapsilosis NOT DETECTED NOT DETECTED Final   Candida tropicalis NOT DETECTED NOT DETECTED Final   Cryptococcus neoformans/gattii NOT DETECTED NOT DETECTED Final   Meth resistant mecA/C and MREJ NOT DETECTED NOT DETECTED Final    Comment: Performed at [facility], [place] [address], [place], [place] [address]  Culture, body fluid  w Gram Stain-bottle     Status: None (Preliminary result)   Collection Time: 02/27/2022  6:25 PM   Specimen: Fluid  Result Value Ref Range Status   Specimen Description FLUID  Final   Special Requests   Final    BOTTLES DRAWN AEROBIC AND ANAEROBIC Blood Culture results may not be optimal due to an excessive volume of blood received in culture bottles   Culture   Final    NO GROWTH 2 DAYS Performed at [facility], [place] [address], [place], [place] [address]    Report Status PENDING  Incomplete  Gram stain     Status: None   Collection Time: 02/26/2022  6:26 PM   Specimen: Fluid  Result Value Ref Range Status   Specimen Description FLUID  Final   Special Requests NONE  Final   Gram Stain   Final    NO ORGANISMS SEEN NO WBC SEEN Performed at [facility], [place] [address], [place], [place] [address]    Report Status 02/28/2022 FINAL  Final  Respiratory (~20 pathogens) panel by PCR     Status: None   Collection Time: 02/18/22 10:58 AM   Specimen: Nasopharyngeal Swab; Respiratory  Result Value Ref Range Status   Adenovirus NOT DETECTED NOT DETECTED Final   Coronavirus 229E NOT DETECTED NOT DETECTED Final    Comment: (NOTE) The Coronavirus on the Respiratory Panel, DOES NOT test for the novel  Coronavirus (2019 nCoV)    Coronavirus HKU1 NOT DETECTED NOT DETECTED Final   Coronavirus NL63 NOT DETECTED NOT DETECTED Final   Coronavirus OC43 NOT DETECTED NOT DETECTED Final   Metapneumovirus NOT DETECTED NOT DETECTED Final   Rhinovirus / Enterovirus NOT DETECTED NOT DETECTED Final   Influenza A NOT DETECTED NOT DETECTED Final   Influenza B NOT DETECTED NOT DETECTED Final   Parainfluenza Virus 1 NOT DETECTED NOT DETECTED Final   Parainfluenza Virus 2 NOT DETECTED NOT DETECTED Final   Parainfluenza Virus 3 NOT DETECTED NOT DETECTED Final   Parainfluenza Virus 4 NOT DETECTED NOT DETECTED Final   Respiratory Syncytial Virus NOT DETECTED NOT DETECTED Final   Bordetella pertussis  NOT DETECTED NOT DETECTED Final   Bordetella Parapertussis NOT DETECTED NOT DETECTED Final   Chlamydophila pneumoniae NOT DETECTED NOT DETECTED Final   Mycoplasma pneumoniae NOT DETECTED NOT DETECTED Final    Comment: Performed at [facility], [place] [address], [place], [place] [address]     Serology:   Imaging: If present, new imagings (plain films, ct scans, and mri) have been personally visualized and interpreted; radiology reports have been reviewed. Decision making incorporated into the Impression / Recommendations.  12/17 tte Sonographer Comments: Image acquisition challenging due to patient body habitus and Image acquisition challenging due to respiratory motion.  IMPRESSIONS    1. Left ventricular ejection fraction, by estimation, is 50 to 55%. The left ventricle has low normal function. The left ventricle has no regional wall motion abnormalities. Left ventricular diastolic parameters are indeterminate.   2. Right ventricular systolic function is normal. The right ventricular size is normal. There is normal pulmonary artery systolic pressure.   3. The mitral valve was not well visualized. Moderate mitral valve regurgitation.   4. The aortic valve is tricuspid. There is mild calcification of the  aortic valve. There is mild thickening of the aortic valve. Aortic valve regurgitation is not visualized.   5. The inferior vena cava is normal in size with greater than 50% respiratory variability, suggesting right atrial pressure of 3 mmHg.   Comparison(s): Prior images reviewed side by side. Increase in mitral regurgitation.    12/18 ct soft tissue neck IMPRESSION: 1. Asymmetric density and heterogeneity of the left parotid gland compatible with Acute Parotitis, or possibly parotid injury in the setting of fall. No complicating features.   2. Multifocal bilateral upper lobe pulmonary opacity is new from aSeptember CTA and appears infectious/inflammatory. Consider Bilateral  Pneumonia including viral/atypical etiologies.    12/18 cxr Scattered areas of ill-defined patchy and nodular airspace disease bilaterally. Imaging features raise concern for multifocal pneumonia. Follow-up recommended to ensure resolution.   Jabier Mutton, [place] [facility] [place] [phone] pager    02/19/2022, 6:57 PM

## 2022-02-19 NOTE — Progress Notes (Signed)
[place], MD notified that pt has temp of 100.7 prior to beginning PRBC transfusion. Orders to give 650 tylenol and 12.5 IV benadryl. Will continue to monitor for potential transfusion reactions.

## 2022-02-19 NOTE — Progress Notes (Signed)
Patient has been between red and yellow MEWS through night.  MD aware and SIRS already implemented.  Patient with positive blood cultures and ABX started over night.  Will continue to monitor.

## 2022-02-19 NOTE — Progress Notes (Incomplete)
PHARMACY CONSULT NOTE FOR:  OUTPATIENT  PARENTERAL ANTIBIOTIC THERAPY (OPAT)  Indication:  Regimen:  End date:   IV antibiotic discharge orders are pended. To discharging provider:  please sign these orders via discharge navigator,  Select New Orders & click on the button choice - Manage This Unsigned Work.     Thank you for allowing pharmacy to be a part of this patient's care.  Lawson Radar 02/19/2022, 3:17 PM

## 2022-02-19 NOTE — Progress Notes (Signed)
Patient not seen today. No clear indication for PRBC transfusion, Hb almost 8 Agree with platelet transfusion to keep it closer to 20 K. No indication for GCSF in AML, will recommend not giving it for now. I will continue to see her periodically.

## 2022-02-19 NOTE — Progress Notes (Signed)
Mobility Specialist - Progress Note   02/19/22 1218  Mobility  Activity Turned to left side;Turned to right side;Turned to back - supine  Level of Assistance Moderate assist, patient does 50-74%  Assistive Device None  Activity Response Tolerated well  Mobility Referral No  $Mobility charge 1 Mobility   Pt received in bed and needing to be cleaned up. Pt was ModA for bed mobility. Pt left with all need met NT and RN present.   Franki Monte  Mobility Specialist Please contact via Solicitor or Rehab office at [phone]

## 2022-02-19 NOTE — Progress Notes (Signed)
Rounding Note    Patient Name: Kaitlyn Hull Date of Encounter: 02/19/2022  [place] Cardiologist: Carlyle Dolly, MD   Subjective   HR ***   Inpatient Medications    Scheduled Meds:  sodium chloride   Intravenous Once   sodium chloride   Intravenous Once   acetaminophen  1,000 mg Oral Q8H   Chlorhexidine Gluconate Cloth  6 each Topical Daily   digoxin  0.125 mg Intravenous Once   [START ON 02/20/2022] feeding supplement (GLUCERNA SHAKE)  237 mL Oral BID BM   insulin aspart  0-15 Units Subcutaneous TID WC   magnesium oxide  400 mg Oral BID   metFORMIN  1,000 mg Oral Q supper   multivitamin with minerals  1 tablet Oral Daily   pravastatin  40 mg Oral q1800   Continuous Infusions:  sodium chloride 75 mL/hr at 02/19/22 0806   amiodarone 30 mg/hr (02/19/22 1439)    ceFAZolin (ANCEF) IV 2 g (02/19/22 1438)   PRN Meds: acetaminophen **FOLLOWED BY** [START ON 02/22/2022] acetaminophen, albuterol, ALPRAZolam, metoprolol tartrate, ondansetron **OR** ondansetron (ZOFRAN) IV   Vital Signs    Vitals:   02/19/22 1310 02/19/22 1340 02/19/22 1430 02/19/22 1541  BP: 108/76 111/73 114/77 (!) 115/91  Pulse: (!) 146 (!) 141 (!) 140 93  Resp:   14 16  Temp:  100 F (37.8 C)  98.8 F (37.1 C)  TempSrc:   Oral Oral  SpO2: 99% 98% 99% 100%  Weight:      Height:        Intake/Output Summary (Last 24 hours) at 02/19/2022 1800 Last data filed at 02/19/2022 1430 Gross per 24 hour  Intake 3085.25 ml  Output 450 ml  Net 2635.25 ml       02/19/2022   11:56 PM 02/02/2022    8:42 AM 02/05/2022    1:39 PM  Last 3 Weights  Weight (lbs) 248 lb 9.6 oz 258 lb 257 lb 12.8 oz  Weight (kg) 112.764 kg 117.028 kg 116.937 kg      Telemetry    Afib with HR 110-130- Personally Reviewed  ECG    No new tracing- Personally Reviewed  Physical Exam   GEN: No acute distress.  Laying in bed Neck: No JVD Cardiac: Irregular, tachycardic. No murmurs Respiratory: Clear  to auscultation bilaterally. GI: Soft, nontender, non-distended  MS: Trace edema, warm Neuro: AAOx3 Psych: Normal affect   Labs    High Sensitivity Troponin:  No results for input(s): "TROPONINIHS" in the last 720 hours.   Chemistry Recent Labs  Lab 02/20/2022 0845 03/04/2022 0902 02/18/22 0645 02/18/22 0646 02/18/22 1035 02/19/22 0347  NA 130*  --  136  --   --  130*  K 3.8  --  5.7*  --  4.1 3.2*  CL 98  --  108  --   --  101  CO2 21*  --  16*  --   --  20*  GLUCOSE 223*  --  172*  --   --  108*  BUN 27*  --  24*  --   --  21  CREATININE 1.61*  --  1.36*  --   --  1.30*  CALCIUM 8.9  --  8.3*  --   --  7.6*  MG  --  0.7*  --  1.7  --  1.6*  PROT 7.3  --   --   --   --  5.4*  ALBUMIN 3.3*  --   --   --   --  2.1*  AST 34  --   --   --   --  31  ALT 19  --   --   --   --  23  ALKPHOS 68  --   --   --   --  40  BILITOT 3.1*  --   --   --   --  1.8*  GFRNONAA 35*  --  43*  --   --  46*  ANIONGAP 11  --  12  --   --  9     Lipids No results for input(s): "CHOL", "TRIG", "HDL", "LABVLDL", "LDLCALC", "CHOLHDL" in the last 168 hours.  Hematology Recent Labs  Lab 02/18/22 1035 02/18/22 2343 02/19/22 0347  WBC <0.1* <0.1* <0.1*  RBC 2.19* 2.52* 2.58*  HGB 6.6* 7.8* 7.9*  HCT 19.9* 22.3* 23.3*  MCV 90.9 88.5 90.3  MCH 30.1 31.0 30.6  MCHC 33.2 35.0 33.9  RDW 21.2* 18.0* 18.2*  PLT 17* 13* 12*    Thyroid No results for input(s): "TSH", "FREET4" in the last 168 hours.  BNP Recent Labs  Lab 02/11/2022 0902 02/19/22 0347  BNP 370.0* 499.1*     DDimer No results for input(s): "DDIMER" in the last 168 hours.   Radiology    DG CHEST PORT 1 VIEW  Result Date: 02/19/2022 CLINICAL DATA:  Airspace disease on neck CT. EXAM: PORTABLE CHEST 1 VIEW COMPARISON:  02/03/2022 FINDINGS: 0803 hours. Scattered areas of ill-defined patchy and nodular airspace disease noted bilaterally. No edema or substantial effusion. The cardio pericardial silhouette is enlarged. Telemetry leads  overlie the chest. IMPRESSION: Scattered areas of ill-defined patchy and nodular airspace disease bilaterally. Imaging features raise concern for multifocal pneumonia. Follow-up recommended to ensure resolution. Electronically Signed   By: Misty Stanley M.D.   On: 02/19/2022 08:40   CT SOFT TISSUE NECK W CONTRAST  Result Date: 02/19/2022 CLINICAL DATA:  66 year old female with left facial swelling and redness. Recent fall. EXAM: CT NECK WITH CONTRAST TECHNIQUE: Multidetector CT imaging of the neck was performed using the standard protocol following the bolus administration of intravenous contrast. RADIATION DOSE REDUCTION: This exam was performed according to the departmental dose-optimization program which includes automated exposure control, adjustment of the mA and/or kV according to patient size and/or use of iterative reconstruction technique. CONTRAST:  39m OMNIPAQUE IOHEXOL 350 MG/ML SOLN COMPARISON:  Head CT 02/11/2022. CTA chest 12/02/2021. FINDINGS: Pharynx and larynx: Glottis is closed. And the nasopharynx is effaced at the level of the soft palate. But otherwise laryngeal and pharyngeal soft tissue contours are within normal limits. Parapharyngeal and retropharyngeal spaces appear to remain normal. Salivary glands: Negative sublingual space. Submandibular glands appear symmetric and within normal limits. The right parotid gland appears normal. But there is asymmetric heterogeneous increased density in the left parotid gland (series 3, image 43 and see coronal image 73) no discrete parotid mass is associated. Soft tissue at the left stylomastoid foramen appears to remain normal. The deep lobe of the gland has a more normal appearance. There does appear to be mild overlying subcutaneous soft tissue stranding. No sialolithiasis.  Left parotid duct does not appear enlarged. Thyroid: Negative. Lymph nodes: No cervical lymphadenopathy. However, left level 2 lymph nodes are larger than those on the right  (only 6 mm short axis) and might be reactive. Vascular: Suboptimal intravascular contrast bolus. But the major vascular structures in the neck and at the skull base appear to remain patent. Tortuous carotid arteries each with a partially  retropharyngeal course. Bilateral carotid bifurcation calcified atherosclerosis. Limited intracranial: Negative. Visualized orbits: Negative. Mastoids and visualized paranasal sinuses: Visualized paranasal sinuses and mastoids are clear. Tympanic cavities are clear. Skeleton: Mandible intact and normally located. Cervical spine disc and endplate degeneration. No acute osseous abnormality identified. Medial, superior left breast soft tissue thickening and architectural distortion on series 3, image 131 appears stable since September, with other postoperative changes to the left breast visible at that time. Upper chest: Multifocal Patchy and confluent scattered bilateral upper lobe pulmonary opacity. This is new from the September CTA. Visible major airways remain patent. No pleural effusion identified. Calcified aortic atherosclerosis. Visible superior mediastinal lymph nodes remain within normal limits. IMPRESSION: 1. Asymmetric density and heterogeneity of the left parotid gland compatible with Acute Parotitis, or possibly parotid injury in the setting of fall. No complicating features. 2. Multifocal bilateral upper lobe pulmonary opacity is new from a September CTA and appears infectious/inflammatory. Consider Bilateral Pneumonia including viral/atypical etiologies. Electronically Signed   By: Genevie Ann M.D.   On: 02/19/2022 06:09   ECHOCARDIOGRAM COMPLETE  Result Date: 02/18/2022    ECHOCARDIOGRAM REPORT   Patient Name:   Kaitlyn [facility] Date of Exam: 02/18/2022 Medical Rec #:  [id number]     Height:       62.0 in Accession #:    [id number]    Weight:       248.6 lb Date of Birth:  1955-12-25    BSA:          2.097 m Patient Age:    66 years      BP:           103/37 mmHg  Patient Gender: F             HR:           124 bpm. Exam Location:  Inpatient Procedure: 2D Echo, Cardiac Doppler and Color Doppler Indications:    Atrial Fibrillation I48.91  History:        Patient has prior history of Echocardiogram examinations, most                 recent 07/18/2021. Arrythmias:Atrial Fibrillation and                 Tachycardia; Risk Factors:Diabetes, Hypertension and Former                 Smoker.  Sonographer:    Greer Pickerel Referring Phys: [id number] [place] Shi  Sonographer Comments: Image acquisition challenging due to patient body habitus and Image acquisition challenging due to respiratory motion. IMPRESSIONS  1. Left ventricular ejection fraction, by estimation, is 50 to 55%. The left ventricle has low normal function. The left ventricle has no regional wall motion abnormalities. Left ventricular diastolic parameters are indeterminate.  2. Right ventricular systolic function is normal. The right ventricular size is normal. There is normal pulmonary artery systolic pressure.  3. The mitral valve was not well visualized. Moderate mitral valve regurgitation.  4. The aortic valve is tricuspid. There is mild calcification of the aortic valve. There is mild thickening of the aortic valve. Aortic valve regurgitation is not visualized.  5. The inferior vena cava is normal in size with greater than 50% respiratory variability, suggesting right atrial pressure of 3 mmHg. Comparison(s): Prior images reviewed side by side. Increase in mitral regurgitation. FINDINGS  Left Ventricle: Left ventricular ejection fraction, by estimation, is 50 to 55%. The left ventricle has low normal function. The left ventricle has  no regional wall motion abnormalities. The left ventricular internal cavity size was normal in size. There is no left ventricular hypertrophy. Left ventricular diastolic parameters are indeterminate. Right Ventricle: The right ventricular size is normal. No increase in right ventricular  wall thickness. Right ventricular systolic function is normal. There is normal pulmonary artery systolic pressure. The tricuspid regurgitant velocity is 1.54 m/s, and  with an assumed right atrial pressure of 3 mmHg, the estimated right ventricular systolic pressure is 29.9 mmHg. Left Atrium: Left atrial size was normal in size. Right Atrium: Right atrial size was normal in size. Pericardium: Trivial pericardial effusion is present. Presence of epicardial fat layer. Mitral Valve: The mitral valve was not well visualized. Moderate mitral valve regurgitation. Tricuspid Valve: The tricuspid valve is normal in structure. Tricuspid valve regurgitation is not demonstrated. No evidence of tricuspid stenosis. Aortic Valve: The aortic valve is tricuspid. There is mild calcification of the aortic valve. There is mild thickening of the aortic valve. Aortic valve regurgitation is not visualized. Pulmonic Valve: The pulmonic valve was grossly normal. Pulmonic valve regurgitation is not visualized. No evidence of pulmonic stenosis. Aorta: The aortic root and ascending aorta are structurally normal, with no evidence of dilitation. Venous: The inferior vena cava is normal in size with greater than 50% respiratory variability, suggesting right atrial pressure of 3 mmHg. IAS/Shunts: No atrial level shunt detected by color flow Doppler.  LEFT VENTRICLE PLAX 2D LVIDd:         4.40 cm   Diastology LVIDs:         3.20 cm   LV e' medial:    7.72 cm/s LV PW:         1.30 cm   LV E/e' medial:  13.3 LV IVS:        1.00 cm   LV e' lateral:   11.30 cm/s LVOT diam:     1.60 cm   LV E/e' lateral: 9.1 LV SV:         26 LV SV Index:   12 LVOT Area:     2.01 cm  RIGHT VENTRICLE RV S prime:     12.40 cm/s TAPSE (M-mode): 1.3 cm LEFT ATRIUM             Index        RIGHT ATRIUM           Index LA diam:        4.30 cm 2.05 cm/m   RA Area:     17.40 cm LA Vol (A2C):   63.7 ml 30.38 ml/m  RA Volume:   47.90 ml  22.84 ml/m LA Vol (A4C):   54.3 ml  25.90 ml/m LA Biplane Vol: 62.2 ml 29.66 ml/m  AORTIC VALVE LVOT Vmax:   99.00 cm/s LVOT Vmean:  63.900 cm/s LVOT VTI:    0.127 m  AORTA Ao Root diam: 3.40 cm Ao Asc diam:  3.40 cm MITRAL VALVE                TRICUSPID VALVE MV Area (PHT): 9.60 cm     TR Peak grad:   9.5 mmHg MV Decel Time: 79 msec      TR Vmax:        154.00 cm/s MR Peak grad: 50.8 mmHg MR Vmax:      356.50 cm/s   SHUNTS MV E velocity: 103.00 cm/s  Systemic VTI:  0.13 m MV A velocity: 70.40 cm/s   Systemic Diam: 1.60 cm MV E/A ratio:  1.46 Rudean Haskell MD Electronically signed by Rudean Haskell MD Signature Date/Time: 02/18/2022/6:16:34 PM    Final     Cardiac Studies  TTE 02/18/22: IMPRESSIONS     1. Left ventricular ejection fraction, by estimation, is 50 to 55%. The  left ventricle has low normal function. The left ventricle has no regional  wall motion abnormalities. Left ventricular diastolic parameters are  indeterminate.   2. Right ventricular systolic function is normal. The right ventricular  size is normal. There is normal pulmonary artery systolic pressure.   3. The mitral valve was not well visualized. Moderate mitral valve  regurgitation.   4. The aortic valve is tricuspid. There is mild calcification of the  aortic valve. There is mild thickening of the aortic valve. Aortic valve  regurgitation is not visualized.   5. The inferior vena cava is normal in size with greater than 50%  respiratory variability, suggesting right atrial pressure of 3 mmHg.   Comparison(s): Prior images reviewed side by side. Increase in mitral  regurgitation.     Patient Profile     66 y.o. female with a hx of chronic atrial fibrillation, type 2 DM, HTN, AML, morbid obesity who presented with weakness, poor PO intake and falls found to be septic with staph bacteremia. Course complicated by Afib with RVR for which Cardiology was consulted.  Assessment & Plan    #Permanent atrial fibrillation:  Patient with  known permanent A-fib who developed RVR in the setting of sepsis. Has been started on IV amiodarone for rate control given hypotension.  She is not a candidate for anticoagulation given severe anemia/thrombocytopenia -Plan will be for rate control, suspect will improve with treatment of her sepsis -S/p dig load on 02/19/22 -Continue IV amiodarone for rate control for now (low risk of chemical cardioversion due to permanent Afib and patient cannot tolerate nodal agents due to hypotension) -Cannot tolerate AC due to severe thrombocytopenia and anemia -Cannot tolerate BB/dilt due to hypotension  #Sepsis secondary to staph bacteremia: Presented with fever, neutropenia.  Found to have staph bacteremia with left neck cellulitis. Now on cefazloin. TTE with LVEF 50-55%, normal RV, no valvular vegetations visualized. Not a candidate for TEE at this time due to severe thrombocytopenia.  -TTE without significant valvular pathology -Not currently a candidate for TEE given severe thrombocytopenia.  Need platelet count greater than 50   For questions or updates, please contact [place] Please consult [email or web address] for contact info under        Signed, Freada Bergeron, MD  02/19/2022, 6:00 PM

## 2022-02-20 ENCOUNTER — Inpatient Hospital Stay (HOSPITAL_COMMUNITY): Payer: PPO

## 2022-02-20 DIAGNOSIS — R918 Other nonspecific abnormal finding of lung field: Secondary | ICD-10-CM

## 2022-02-20 DIAGNOSIS — D709 Neutropenia, unspecified: Secondary | ICD-10-CM | POA: Diagnosis not present

## 2022-02-20 DIAGNOSIS — I4891 Unspecified atrial fibrillation: Secondary | ICD-10-CM | POA: Diagnosis not present

## 2022-02-20 DIAGNOSIS — R7881 Bacteremia: Secondary | ICD-10-CM | POA: Diagnosis not present

## 2022-02-20 DIAGNOSIS — C9200 Acute myeloblastic leukemia, not having achieved remission: Secondary | ICD-10-CM | POA: Diagnosis not present

## 2022-02-20 DIAGNOSIS — B9561 Methicillin susceptible Staphylococcus aureus infection as the cause of diseases classified elsewhere: Secondary | ICD-10-CM | POA: Diagnosis not present

## 2022-02-20 LAB — BPAM PLATELET PHERESIS
Blood Product Expiration Date: 202312182359
Blood Product Expiration Date: 202312192359
ISSUE DATE / TIME: 202312180827
ISSUE DATE / TIME: 202312181158
Unit Type and Rh: 6200
Unit Type and Rh: 6200

## 2022-02-20 LAB — CULTURE, BLOOD (ROUTINE X 2): Special Requests: ADEQUATE

## 2022-02-20 LAB — COMPREHENSIVE METABOLIC PANEL
ALT: 13 U/L (ref 0–44)
AST: 24 U/L (ref 15–41)
Albumin: 1.9 g/dL — ABNORMAL LOW (ref 3.5–5.0)
Alkaline Phosphatase: 49 U/L (ref 38–126)
Anion gap: 11 (ref 5–15)
BUN: 19 mg/dL (ref 8–23)
CO2: 17 mmol/L — ABNORMAL LOW (ref 22–32)
Calcium: 7.9 mg/dL — ABNORMAL LOW (ref 8.9–10.3)
Chloride: 106 mmol/L (ref 98–111)
Creatinine, Ser: 1.2 mg/dL — ABNORMAL HIGH (ref 0.44–1.00)
GFR, Estimated: 50 mL/min — ABNORMAL LOW (ref >60)
Glucose, Bld: 116 mg/dL — ABNORMAL HIGH (ref 70–99)
Potassium: 3.9 mmol/L (ref 3.5–5.1)
Sodium: 134 mmol/L — ABNORMAL LOW (ref 135–145)
Total Bilirubin: 2.2 mg/dL — ABNORMAL HIGH (ref 0.3–1.2)
Total Protein: 5.2 g/dL — ABNORMAL LOW (ref 6.5–8.1)

## 2022-02-20 LAB — PHOSPHORUS: Phosphorus: 2.3 mg/dL — ABNORMAL LOW (ref 2.5–4.6)

## 2022-02-20 LAB — PREPARE PLATELET PHERESIS
Unit division: 0
Unit division: 0

## 2022-02-20 LAB — GLUCOSE, CAPILLARY
Glucose-Capillary: 106 mg/dL — ABNORMAL HIGH (ref 70–99)
Glucose-Capillary: 106 mg/dL — ABNORMAL HIGH (ref 70–99)
Glucose-Capillary: 106 mg/dL — ABNORMAL HIGH (ref 70–99)
Glucose-Capillary: 114 mg/dL — ABNORMAL HIGH (ref 70–99)

## 2022-02-20 LAB — CRYPTOCOCCAL ANTIGEN: Crypto Ag: NEGATIVE

## 2022-02-20 LAB — MAGNESIUM: Magnesium: 2 mg/dL (ref 1.7–2.4)

## 2022-02-20 LAB — PREPARE RBC (CROSSMATCH)

## 2022-02-20 MED ORDER — SODIUM CHLORIDE 0.9% IV SOLUTION: Freq: Once | INTRAVENOUS | Status: DC

## 2022-02-20 NOTE — Plan of Care (Signed)

## 2022-02-20 NOTE — Progress Notes (Signed)
Current cytopenias likely from sepsis and ongoing infection Can consider one unit of PRBC to keep Hb close to 8 and one unit of platelets, leukocyte reduced irradiated , goal plts of 20K. Looks like patient may have already received platelets. CT imaging shows multifocal pneumonia, ID following. Appreciate ID recommendations. Hold venetoclax and management of sepsis per ID team. We will continue to follow peripherally No role for GCSF.

## 2022-02-20 NOTE — Progress Notes (Signed)
PROGRESS NOTE    Kaitlyn Hull  OZD:[id number] DOB: Feb 28, 1956 DOA: 02/16/2022 PCP: Ledell N[facility]  Chief Complaint  Patient presents with   Weakness    Brief Narrative:  [name] is Nil Xiong 66 year old female with [name] history of chronic atrial fibrillation, diabetes mellitus type 2, hypertension, AML morbid obesity presenting with 2-day history of generalized weakness, decreased oral intake, and falling.  She has been found to have staph bacteremia in setting of parotitis and multifocal pneumonia with febrile neutropenia and pancytopenia.    See below for additional details   Assessment & Plan:   Principal Problem:   Atrial fibrillation with tachycardic ventricular rate (HCC) Active Problems:   Acute Myeloid Leukemia/Abnormal blood smear/   Neutropenia (HCC)   Morbid obesity due to excess calories (HCC)   Chronic atrial fibrillation (HCC)   Acute myeloid leukemia in adult [facility])   Neutropenic fever ([place])   Sepsis ([place])   MSSA bacteremia   Parotitis   Pulmonary nodules  Sepsis due to Staph Bacteremia Left Sided Parotitis and Cellulitis of Left Sided of Face Multifocal Pneumonia PICC line in place Febrile Neutropenia Sepsis ruled in with fever, neutropenia, tachycardia, tachypnea Workup thus far revealing for L neck skin soft tissue infection, no clear fluctuance on exam, lower suspicion for abscess 12/16 blood cultures with staph aureus (BCID without resistance gene detected) Repeat cultures from 12/18 pending Line holiday, PICC line removed 12/17 UA with nitrite, many bacteria -> follow urine culture CT chest concerning for multifocal pneumonia - needs follow up imaging after treatment to ensure resolution CT neck notable for parotitis  Echocardiogram with EF 50-55%, no RWMA No TEE right now due to thrombocytopenia Will consult ID and follow recommendations - requesting pulm c/s for possible bronch/bal - endemic fungi, aspergillus serology - planning for 6 weeks  treatment with cefazolin  Acute Myeloid Leukemia Pancytopenia Comanaged at [facility] by Dr. Jerrye Noble and AP with Dr. Delton Coombes Receiving venetoclax/decitabine (last cycle started 11/27).  Currently holding venetoclax. S/p 4 units platelets S/p 2 units pRBC's Granix x1 was ordered by Dr. Claria Dice  Appreciate assistance from Dr. Chryl Heck - no role for GCSF Use leukocyte reduced and irradiated blood products  Transfuse for Hb <8 and platelets <20   Atrial Fibrillation with RVR Related to above BP on soft side, continue metoprolol as tolerated Continue amiodarone per cardiology.  S/p dig load per cards.  Acute Kidney Injury Due to sepsis Follow with management Improving gradually  UA with 30 mg/dl protein, 0-5 RBC  Hypokalemia Replace and follow  T2DM Metformin currently on hold A1c from 10/2021 6.2 SSI   8 mm nodule in superior aspect of L breast Needs mammogram and US  Obesity Body mass index is 45.47 kg/m.    DVT prophylaxis: SCD's Code Status: full Family Communication: none at bedside Disposition:   Status is: Inpatient Remains inpatient appropriate because: continued need for inpatient care with febrile neutropenia, RVR   Consultants:  Cardiology hematology  Procedures:  none  Antimicrobials:  Anti-infectives (From admission, onward)    Start     Dose/Rate Route Frequency Ordered Stop   02/19/22 1800  vancomycin (VANCOREADY) IVPB 1500 mg/300 mL  Status:  Discontinued        1,500 mg 150 mL/hr over 120 Minutes Intravenous Every 48 hours 02/06/2022 1811 02/18/22 1344   02/18/22 1500  ceFAZolin (ANCEF) IVPB 2g/100 mL premix        2 g 200 mL/hr over 30 Minutes Intravenous Every 8 hours  02/18/22 1401     02/18/22 1400  metroNIDAZOLE (FLAGYL) tablet 500 mg  Status:  Discontinued        500 mg Oral Every 8 hours 02/18/22 0926 02/18/22 1344   02/18/22 0600  ceFEPIme (MAXIPIME) 2 g in sodium chloride 0.9 % 100 mL IVPB  Status:  Discontinued        2 g 200  mL/hr over 30 Minutes Intravenous Every 12 hours 02/23/2022 1811 02/18/22 1344   02/19/2022 1515  vancomycin (VANCOCIN) 2,250 mg in sodium chloride 0.9 % 500 mL IVPB        2,250 mg 261.3 mL/hr over 120 Minutes Intravenous  Once 02/04/2022 1509 02/06/2022 2133   02/05/2022 1515  ceFEPIme (MAXIPIME) 2 g in sodium chloride 0.9 % 100 mL IVPB        2 g 200 mL/hr over 30 Minutes Intravenous  Once 03/02/2022 1509 03/02/2022 2229      Subjective: Feeling better today  Objective: Vitals:   02/20/22 1517 02/20/22 1701 02/20/22 1710 02/20/22 1810  BP: 1_0 113/61  Pulse: (!) 129 (!) 127  (!) 114  Resp: _1 Temp: 98.7 F (37.1 C) 98.1 F (36.7 C)  97.8 F (36.6 C)  TempSrc: Oral Oral  Oral  SpO2: 96% 97%  98%  Weight:      Height:        Intake/Output Summary (Last 24 hours) at 02/20/2022 1831 Last data filed at 02/20/2022 1810 Gross per 24 hour  Intake 759.39 ml  Output 750 ml  Net 9.39 ml   Filed Weights   02/27/2022 0842 02/02/2022 2356  Weight: 117 kg 112.8 kg    Examination:  General: No acute distress. Sitting up on edge of bed. Cardiovascular: RRR Lungs: unlabored Abdomen: Soft, nontender, nondistended Neurological: Alert and oriented 3. Moves all extremities 4. Cranial nerves II through XII grossly intact. Extremities: No clubbing or cyanosis. No edema.  Data Reviewed: I have personally reviewed following labs and imaging studies  CBC: Recent Labs  Lab 02/22/2022 0845 02/18/22 0430 02/18/22 1035 02/18/22 2343 02/19/22 0347 02/20/22 0211  WBC 0.1* DUPL <0.1* <0.1* <0.1* <0.1*  NEUTROABS 0.0* PENDING  --   --   --   --   HGB 7.9* DUPL 6.6* 7.8* 7.9* 7.6*  HCT 23.0* DUPL 19.9* 22.3* 23.3* 21.4*  MCV 88.8 DUPL 90.9 88.5 90.3 87.0  PLT 7* DUPL 17* 13* 12* 17*    Basic Metabolic Panel: Recent Labs  Lab 03/02/2022 0845 02/22/2022 0902 02/18/22 0645 02/18/22 0646 02/18/22 1035 02/19/22 0347 02/20/22 0211  NA 130*  --  136  --   --  130* 134*  K 3.8   --  5.7*  --  4.1 3.2* 3.9  CL 98  --  108  --   --  101 106  CO2 21*  --  16*  --   --  20* 17*  GLUCOSE 223*  --  172*  --   --  108* 116*  BUN 27*  --  24*  --   --  21 19  CREATININE 1.61*  --  1.36*  --   --  1.30* 1.20*  CALCIUM 8.9  --  8.3*  --   --  7.6* 7.9*  MG  --  0.7*  --  1.7  --  1.6* 2.0  PHOS  --   --   --   --   --  3.1 2.3*    GFR: Estimated  Creatinine Clearance: 55.5 mL/min (Aleksey Newbern) (by C-G formula based on SCr of 1.2 mg/dL (H)).  Liver Function Tests: Recent Labs  Lab 02/09/2022 0845 02/19/22 0347 02/20/22 0211  AST 34 31 24  ALT _0 ALKPHOS 68 40 49  BILITOT 3.1* 1.8* 2.2*  PROT 7.3 5.4* 5.2*  ALBUMIN 3.3* 2.1* 1.9*    CBG: Recent Labs  Lab 02/19/22 1545 02/19/22 2109 02/20/22 0748 02/20/22 1132 02/20/22 1700  GLUCAP 181* 138* 106* 106* 106*     Recent Results (from the past 240 hour(s))  Resp panel by RT-PCR (RSV, Flu Elener Custodio&B, Covid) Anterior Nasal Swab     Status: None   Collection Time: 02/19/2022  8:59 AM   Specimen: Anterior Nasal Swab  Result Value Ref Range Status   SARS Coronavirus 2 by RT PCR NEGATIVE NEGATIVE Final    Comment: (NOTE) SARS-CoV-2 target nucleic acids are NOT DETECTED.  The SARS-CoV-2 RNA is generally detectable in upper respiratory specimens during the acute phase of infection. The lowest concentration of SARS-CoV-2 viral copies this assay can detect is 138 copies/mL. Francisca Harbuck negative result does not preclude SARS-Cov-2 infection and should not be used as the sole basis for treatment or other patient management decisions. Hollyn Stucky negative result may occur with  improper specimen collection/handling, submission of specimen other than nasopharyngeal swab, presence of viral mutation(s) within the areas targeted by this assay, and inadequate number of viral copies(<138 copies/mL). Yarima Penman negative result must be combined with clinical observations, patient history, and epidemiological information. The expected result is Negative.  Fact  Sheet for Patients:  EntrepreneurPulse.com.au  Fact Sheet for Healthcare Providers:  IncredibleEmployment.be  This test is no t yet approved or cleared by the [place] FDA and  has been authorized for detection and/or diagnosis of SARS-CoV-2 by FDA under an Emergency Use Authorization (EUA). This EUA will remain  in effect (meaning this test can be used) for the duration of the COVID-19 declaration under Section 564(b)(1) of the Act, 21 U.S.C.section 360bbb-3(b)(1), unless the authorization is terminated  or revoked sooner.       Influenza Jane Broughton by PCR NEGATIVE NEGATIVE Final   Influenza B by PCR NEGATIVE NEGATIVE Final    Comment: (NOTE) The Xpert Xpress SARS-CoV-2/FLU/RSV plus assay is intended as an aid in the diagnosis of influenza from Nasopharyngeal swab specimens and should not be used as Bertrand Vowels sole basis for treatment. Nasal washings and aspirates are unacceptable for Xpert Xpress SARS-CoV-2/FLU/RSV testing.  Fact Sheet for Patients: EntrepreneurPulse.com.au  Fact Sheet for Healthcare Providers: IncredibleEmployment.be  This test is not yet approved or cleared by the [place] FDA and has been authorized for detection and/or diagnosis of SARS-CoV-2 by FDA under an Emergency Use Authorization (EUA). This EUA will remain in effect (meaning this test can be used) for the duration of the COVID-19 declaration under Section 564(b)(1) of the Act, 21 U.S.C. section 360bbb-3(b)(1), unless the authorization is terminated or revoked.     Resp Syncytial Virus by PCR NEGATIVE NEGATIVE Final    Comment: (NOTE) Fact Sheet for Patients: EntrepreneurPulse.com.au  Fact Sheet for Healthcare Providers: IncredibleEmployment.be  This test is not yet approved or cleared by the [place] FDA and has been authorized for detection and/or diagnosis of SARS-CoV-2 by FDA under an  Emergency Use Authorization (EUA). This EUA will remain in effect (meaning this test can be used) for the duration of the COVID-19 declaration under Section 564(b)(1) of the Act, 21 U.S.C. section 360bbb-3(b)(1), unless the authorization is terminated or  revoked.  Performed at [facility], [address] [place], [place] [address]   Blood culture (routine x 2)     Status: Abnormal   Collection Time: 02/21/2022  3:19 PM   Specimen: BLOOD  Result Value Ref Range Status   Specimen Description BLOOD SITE NOT SPECIFIED  Final   Special Requests   Final    BOTTLES DRAWN AEROBIC AND ANAEROBIC Blood Culture results may not be optimal due to an inadequate volume of blood received in culture bottles   Culture  Setup Time   Final    GRAM POSITIVE COCCI IN BOTH AEROBIC AND ANAEROBIC BOTTLES CRITICAL VALUE NOTED.  VALUE IS CONSISTENT WITH PREVIOUSLY REPORTED AND CALLED VALUE.    Culture (Kinsie Belford)  Final    STAPHYLOCOCCUS AUREUS SUSCEPTIBILITIES PERFORMED ON PREVIOUS CULTURE WITHIN THE LAST 5 DAYS. Performed at [facility], [place] [address] [place], [place] [address]    Report Status 02/20/2022 FINAL  Final  Blood culture (routine x 2)     Status: Abnormal   Collection Time: 02/26/2022  3:24 PM   Specimen: BLOOD  Result Value Ref Range Status   Specimen Description BLOOD SITE NOT SPECIFIED  Final   Special Requests   Final    BOTTLES DRAWN AEROBIC AND ANAEROBIC Blood Culture adequate volume   Culture  Setup Time   Final    GRAM POSITIVE COCCI IN PAIRS IN CLUSTERS IN BOTH AEROBIC AND ANAEROBIC BOTTLES CRITICAL RESULT CALLED TO, READ BACK BY AND VERIFIED WITH: PHARMD [email or web address] AT 3016 ON 02/18/2022 BY T.SAAD. Performed at [facility], [place] [address] [place], [place] [address]    Culture STAPHYLOCOCCUS AUREUS (Raymond Bhardwaj)  Final   Report Status 02/20/2022 FINAL  Final   Organism ID, Bacteria STAPHYLOCOCCUS AUREUS  Final      Susceptibility   Staphylococcus aureus - MIC*    CIPROFLOXACIN <=0.5  SENSITIVE Sensitive     ERYTHROMYCIN <=0.25 SENSITIVE Sensitive     GENTAMICIN <=0.5 SENSITIVE Sensitive     OXACILLIN <=0.25 SENSITIVE Sensitive     TETRACYCLINE <=1 SENSITIVE Sensitive     VANCOMYCIN 1 SENSITIVE Sensitive     TRIMETH/SULFA <=10 SENSITIVE Sensitive     CLINDAMYCIN <=0.25 SENSITIVE Sensitive     RIFAMPIN <=0.5 SENSITIVE Sensitive     Inducible Clindamycin NEGATIVE Sensitive     * STAPHYLOCOCCUS AUREUS  Blood Culture ID Panel (Reflexed)     Status: Abnormal   Collection Time: 02/11/2022  3:24 PM  Result Value Ref Range Status   Enterococcus faecalis NOT DETECTED NOT DETECTED Final   Enterococcus Faecium NOT DETECTED NOT DETECTED Final   Listeria monocytogenes NOT DETECTED NOT DETECTED Final   Staphylococcus species DETECTED (Cynthia Stainback) NOT DETECTED Final    Comment: CRITICAL RESULT CALLED TO, READ BACK BY AND VERIFIED WITH: PHARMD [email or web address] AT 2355 ON 02/18/2022 BY T.SAAD.    Staphylococcus aureus (BCID) DETECTED (Davontay Watlington) NOT DETECTED Final    Comment: CRITICAL RESULT CALLED TO, READ BACK BY AND VERIFIED WITH: PHARMD [email or web address] AT 7322 ON 02/18/2022 BY T.SAAD.    Staphylococcus epidermidis NOT DETECTED NOT DETECTED Final   Staphylococcus lugdunensis NOT DETECTED NOT DETECTED Final   Streptococcus species NOT DETECTED NOT DETECTED Final   Streptococcus agalactiae NOT DETECTED NOT DETECTED Final   Streptococcus pneumoniae NOT DETECTED NOT DETECTED Final   Streptococcus pyogenes NOT DETECTED NOT DETECTED Final   Korrey Schleicher.calcoaceticus-baumannii NOT DETECTED NOT DETECTED Final   Bacteroides fragilis NOT DETECTED NOT DETECTED Final   Enterobacterales NOT DETECTED NOT  DETECTED Final   Enterobacter cloacae complex NOT DETECTED NOT DETECTED Final   Escherichia coli NOT DETECTED NOT DETECTED Final   Klebsiella aerogenes NOT DETECTED NOT DETECTED Final   Klebsiella oxytoca NOT DETECTED NOT DETECTED Final   Klebsiella pneumoniae NOT DETECTED NOT DETECTED Final   Proteus species NOT DETECTED NOT  DETECTED Final   Salmonella species NOT DETECTED NOT DETECTED Final   Serratia marcescens NOT DETECTED NOT DETECTED Final   Haemophilus influenzae NOT DETECTED NOT DETECTED Final   Neisseria meningitidis NOT DETECTED NOT DETECTED Final   Pseudomonas aeruginosa NOT DETECTED NOT DETECTED Final   Stenotrophomonas maltophilia NOT DETECTED NOT DETECTED Final   Candida albicans NOT DETECTED NOT DETECTED Final   Candida auris NOT DETECTED NOT DETECTED Final   Candida glabrata NOT DETECTED NOT DETECTED Final   Candida krusei NOT DETECTED NOT DETECTED Final   Candida parapsilosis NOT DETECTED NOT DETECTED Final   Candida tropicalis NOT DETECTED NOT DETECTED Final   Cryptococcus neoformans/gattii NOT DETECTED NOT DETECTED Final   Meth resistant mecA/C and MREJ NOT DETECTED NOT DETECTED Final    Comment: Performed at [facility], [place] [address], [place], [place] [address]  Culture, body fluid w Gram Stain-bottle     Status: None (Preliminary result)   Collection Time: 03/03/2022  6:25 PM   Specimen: Fluid  Result Value Ref Range Status   Specimen Description FLUID  Final   Special Requests   Final    BOTTLES DRAWN AEROBIC AND ANAEROBIC Blood Culture results may not be optimal due to an excessive volume of blood received in culture bottles   Culture   Final    NO GROWTH 3 DAYS Performed at [facility], [place] [address] [place], [place] [address]    Report Status PENDING  Incomplete  Gram stain     Status: None   Collection Time: 02/21/2022  6:26 PM   Specimen: Fluid  Result Value Ref Range Status   Specimen Description FLUID  Final   Special Requests NONE  Final   Gram Stain   Final    NO ORGANISMS SEEN NO WBC SEEN Performed at [facility], [place] [address], [place], [place] [address]    Report Status 03/02/2022 FINAL  Final  Respiratory (~20 pathogens) panel by PCR     Status: None   Collection Time: 02/18/22 10:58 AM   Specimen: Nasopharyngeal Swab; Respiratory  Result  Value Ref Range Status   Adenovirus NOT DETECTED NOT DETECTED Final   Coronavirus 229E NOT DETECTED NOT DETECTED Final    Comment: (NOTE) The Coronavirus on the Respiratory Panel, DOES NOT test for the novel  Coronavirus (2019 nCoV)    Coronavirus HKU1 NOT DETECTED NOT DETECTED Final   Coronavirus NL63 NOT DETECTED NOT DETECTED Final   Coronavirus OC43 NOT DETECTED NOT DETECTED Final   Metapneumovirus NOT DETECTED NOT DETECTED Final   Rhinovirus / Enterovirus NOT DETECTED NOT DETECTED Final   Influenza Vonda Harth NOT DETECTED NOT DETECTED Final   Influenza B NOT DETECTED NOT DETECTED Final   Parainfluenza Virus 1 NOT DETECTED NOT DETECTED Final   Parainfluenza Virus 2 NOT DETECTED NOT DETECTED Final   Parainfluenza Virus 3 NOT DETECTED NOT DETECTED Final   Parainfluenza Virus 4 NOT DETECTED NOT DETECTED Final   Respiratory Syncytial Virus NOT DETECTED NOT DETECTED Final   Bordetella pertussis NOT DETECTED NOT DETECTED Final   Bordetella Parapertussis NOT DETECTED NOT DETECTED Final   Chlamydophila pneumoniae NOT DETECTED NOT  DETECTED Final   Mycoplasma pneumoniae NOT DETECTED NOT DETECTED Final    Comment: Performed at [facility], [place] [address], [place], [place] [address]  Culture, blood (Routine X 2) w Reflex to ID Panel     Status: None (Preliminary result)   Collection Time: 02/19/22  5:54 AM   Specimen: BLOOD  Result Value Ref Range Status   Specimen Description BLOOD BLOOD LEFT ARM  Final   Special Requests   Final    BOTTLES DRAWN AEROBIC ONLY Blood Culture adequate volume   Culture   Final    NO GROWTH 1 DAY Performed at [facility], [place] [address] [place], [place] [address]    Report Status PENDING  Incomplete  Culture, blood (Routine X 2) w Reflex to ID Panel     Status: None (Preliminary result)   Collection Time: 02/19/22  5:54 AM   Specimen: BLOOD  Result Value Ref Range Status   Specimen Description BLOOD BLOOD LEFT HAND  Final   Special Requests   Final     BOTTLES DRAWN AEROBIC ONLY Blood Culture adequate volume   Culture   Final    NO GROWTH 1 DAY Performed at [facility], [place] [address], [place], [place] [address]    Report Status PENDING  Incomplete         Radiology Studies: CT CHEST WO CONTRAST  Result Date: 02/20/2022 CLINICAL DATA:  Abnormal chest x-ray. Neutropenic fever patient on chemotherapy EXAM: CT CHEST WITHOUT CONTRAST TECHNIQUE: Multidetector CT imaging of the chest was performed following the standard protocol without IV contrast. RADIATION DOSE REDUCTION: This exam was performed according to the departmental dose-optimization program which includes automated exposure control, adjustment of the mA and/or kV according to patient size and/or use of iterative reconstruction technique. COMPARISON:  Chest x-ray February 19, 2022. CT of the chest December 02, 2021. FINDINGS: Cardiovascular: Calcified atherosclerotic changes identified in the left coronary arteries. Heart size is unremarkable. The thoracic aorta demonstrates minimal calcified atherosclerotic change. Central pulmonary arteries are normal caliber. Mediastinum/Nodes: Lamiah Marmol nodule is identified in the superior aspect of the left breast on series 4, image 32 measuring 8 mm, not identified on the CT scan dated December 02, 2021. Postoperative changes are identified in the left breast. Chest wall is otherwise unremarkable. No pleural or pericardial effusions. The esophagus and visualized thyroid are normal. No adenopathy. Lungs/Pleura: Central airways are normal. No pneumothorax. Bilateral patchy infiltrates are identified in the lungs consistent with multifocal pneumonia. No cavitation or complication identified. Upper Abdomen: The liver demonstrates Kolbie Clarkston cirrhotic morphology with Andrienne Havener nodular contour. Musculoskeletal: No chest wall mass or suspicious bone lesions identified. IMPRESSION: 1. Multifocal infiltrate worrisome for pneumonia. Recommend follow-up imaging after  treatment to ensure resolution. 2. 8 mm nodule in the superior aspect of the left breast, not identified on the CT scan dated December 02, 2021. Recommend diagnostic mammography and ultrasound for further evaluation. 3. Calcified atherosclerotic change in the left coronary arteries. 4. Calcified atherosclerotic change in the thoracic aorta. 5. Cirrhotic morphology of the liver. Aortic Atherosclerosis (ICD10-I70.0). Electronically Signed   By: Dorise Bullion III M.D.   On: 02/20/2022 09:53   DG CHEST PORT 1 VIEW  Result Date: 02/19/2022 CLINICAL DATA:  Airspace disease on neck CT. EXAM: PORTABLE CHEST 1 VIEW COMPARISON:  02/16/2022 FINDINGS: 0803 hours. Scattered areas of ill-defined patchy and nodular airspace disease noted bilaterally. No edema or substantial effusion. The cardio pericardial silhouette is enlarged. Telemetry leads overlie  the chest. IMPRESSION: Scattered areas of ill-defined patchy and nodular airspace disease bilaterally. Imaging features raise concern for multifocal pneumonia. Follow-up recommended to ensure resolution. Electronically Signed   By: Misty Stanley M.D.   On: 02/19/2022 08:40   CT SOFT TISSUE NECK W CONTRAST  Result Date: 02/19/2022 CLINICAL DATA:  66 year old female with left facial swelling and redness. Recent fall. EXAM: CT NECK WITH CONTRAST TECHNIQUE: Multidetector CT imaging of the neck was performed using the standard protocol following the bolus administration of intravenous contrast. RADIATION DOSE REDUCTION: This exam was performed according to the departmental dose-optimization program which includes automated exposure control, adjustment of the mA and/or kV according to patient size and/or use of iterative reconstruction technique. CONTRAST:  93m OMNIPAQUE IOHEXOL 350 MG/ML SOLN COMPARISON:  Head CT 02/22/2022. CTA chest 12/02/2021. FINDINGS: Pharynx and larynx: Glottis is closed. And the nasopharynx is effaced at the level of the soft palate. But otherwise  laryngeal and pharyngeal soft tissue contours are within normal limits. Parapharyngeal and retropharyngeal spaces appear to remain normal. Salivary glands: Negative sublingual space. Submandibular glands appear symmetric and within normal limits. The right parotid gland appears normal. But there is asymmetric heterogeneous increased density in the left parotid gland (series 3, image 43 and see coronal image 73) no discrete parotid mass is associated. Soft tissue at the left stylomastoid foramen appears to remain normal. The deep lobe of the gland has Demondre Aguas more normal appearance. There does appear to be mild overlying subcutaneous soft tissue stranding. No sialolithiasis.  Left parotid duct does not appear enlarged. Thyroid: Negative. Lymph nodes: No cervical lymphadenopathy. However, left level 2 lymph nodes are larger than those on the right (only 6 mm short axis) and might be reactive. Vascular: Suboptimal intravascular contrast bolus. But the major vascular structures in the neck and at the skull base appear to remain patent. Tortuous carotid arteries each with Reginaldo Hazard partially retropharyngeal course. Bilateral carotid bifurcation calcified atherosclerosis. Limited intracranial: Negative. Visualized orbits: Negative. Mastoids and visualized paranasal sinuses: Visualized paranasal sinuses and mastoids are clear. Tympanic cavities are clear. Skeleton: Mandible intact and normally located. Cervical spine disc and endplate degeneration. No acute osseous abnormality identified. Medial, superior left breast soft tissue thickening and architectural distortion on series 3, image 131 appears stable since September, with other postoperative changes to the left breast visible at that time. Upper chest: Multifocal Patchy and confluent scattered bilateral upper lobe pulmonary opacity. This is new from the September CTA. Visible major airways remain patent. No pleural effusion identified. Calcified aortic atherosclerosis. Visible  superior mediastinal lymph nodes remain within normal limits. IMPRESSION: 1. Asymmetric density and heterogeneity of the left parotid gland compatible with Acute Parotitis, or possibly parotid injury in the setting of fall. No complicating features. 2. Multifocal bilateral upper lobe pulmonary opacity is new from Salimatou Simone September CTA and appears infectious/inflammatory. Consider Bilateral Pneumonia including viral/atypical etiologies. Electronically Signed   By: HGenevie AnnM.D.   On: 02/19/2022 06:09        Scheduled Meds:  sodium chloride   Intravenous Once   sodium chloride   Intravenous Once   sodium chloride   Intravenous Once   sodium chloride   Intravenous Once   acetaminophen  1,000 mg Oral Q8H   feeding supplement (GLUCERNA SHAKE)  237 mL Oral BID BM   insulin aspart  0-15 Units Subcutaneous TID WC   magnesium oxide  400 mg Oral BID   metFORMIN  1,000 mg Oral Q supper   multivitamin with minerals  1 tablet Oral Daily   pravastatin  40 mg Oral q1800   Continuous Infusions:  sodium chloride 75 mL/hr at 02/19/22 2154   amiodarone 30 mg/hr (02/20/22 1441)    ceFAZolin (ANCEF) IV 2 g (02/20/22 1442)     LOS: 3 days    Time spent: over 30 min    Fayrene Helper, MD [facility]   To contact the attending provider between 7A-7P or the covering provider during after hours 7P-7A, please log into the web site [email or web address] and access using universal [place] password for that web site. If you do not have the password, please call the hospital operator.  02/20/2022, 6:31 PM

## 2022-02-20 NOTE — Care Management Important Message (Signed)
Important Message  Patient Details  Name: Kaitlyn Hull MRN: [id number] Date of Birth: January 17, 1956   Medicare Important Message Given:  Yes     Shelda Altes 02/20/2022, 8:15 AM

## 2022-02-20 NOTE — Progress Notes (Addendum)
Rounding Note    Patient Name: Kaitlyn Hull Date of Encounter: 02/21/2022  [place] Cardiologist: Carlyle Dolly, MD   Subjective   Complains of back pain this morning. Breathing more labored. No chest pain or palpitations.  Blood pressures remain soft HR much better controlled in 80-90s   Inpatient Medications    Scheduled Meds:  sodium chloride   Intravenous Once   sodium chloride   Intravenous Once   sodium chloride   Intravenous Once   sodium chloride   Intravenous Once   sodium chloride   Intravenous Once   acetaminophen  1,000 mg Oral Q8H   feeding supplement (GLUCERNA SHAKE)  237 mL Oral BID BM   furosemide  20 mg Intravenous Once   insulin aspart  0-15 Units Subcutaneous TID WC   magnesium oxide  400 mg Oral BID   multivitamin with minerals  1 tablet Oral Daily   pravastatin  40 mg Oral q1800   Continuous Infusions:  amiodarone 30 mg/hr (02/21/22 0057)    ceFAZolin (ANCEF) IV 2 g (02/21/22 0527)   methocarbamol (ROBAXIN) IV     PRN Meds: acetaminophen **FOLLOWED BY** [START ON 02/22/2022] acetaminophen, albuterol, ALPRAZolam, methocarbamol (ROBAXIN) IV, metoprolol tartrate, ondansetron **OR** ondansetron (ZOFRAN) IV   Vital Signs    Vitals:   02/21/22 0027 02/21/22 0324 02/21/22 0400 02/21/22 0732  BP: 108/71 98/65 (!) 100/56 93/66  Pulse: (!) 101 95 91 78  Resp: '16 16 16 16  '$ Temp: 97.8 F (36.6 C) 97.7 F (36.5 C) 97.7 F (36.5 C) 98.1 F (36.7 C)  TempSrc: Oral Oral Tympanic Axillary  SpO2: 98% 98% 97% 99%  Weight:      Height:        Intake/Output Summary (Last 24 hours) at 02/21/2022 1013 Last data filed at 02/21/2022 0028 Gross per 24 hour  Intake 726 ml  Output 450 ml  Net 276 ml      02/25/2022   11:56 PM 02/14/2022    8:42 AM 02/05/2022    1:39 PM  Last 3 Weights  Weight (lbs) 248 lb 9.6 oz 258 lb 257 lb 12.8 oz  Weight (kg) 112.764 kg 117.028 kg 116.937 kg      Telemetry   Afib 90-100s- Personally  Reviewed  ECG    No new tracing- Personally Reviewed  Physical Exam   GEN: Laying in bed, mildly tachypneic Neck: JVD difficult to assess due to body habitus Cardiac: Irregularly irregular, no murmurs Respiratory: Crackles at the bases with scattered expiratory wheezing GI: Obese, soft, NTTP MS: Trace edema, warm Neuro: AAOx3 Psych: Normal affect   Labs    High Sensitivity Troponin:  No results for input(s): "TROPONINIHS" in the last 720 hours.   Chemistry Recent Labs  Lab 03/02/2022 0845 02/10/2022 0902 02/18/22 0645 02/18/22 3149 02/18/22 1035 02/19/22 0347 02/20/22 0211  NA 130*  --  136  --   --  130* 134*  K 3.8  --  5.7*  --  4.1 3.2* 3.9  CL 98  --  108  --   --  101 106  CO2 21*  --  16*  --   --  20* 17*  GLUCOSE 223*  --  172*  --   --  108* 116*  BUN 27*  --  24*  --   --  21 19  CREATININE 1.61*  --  1.36*  --   --  1.30* 1.20*  CALCIUM 8.9  --  8.3*  --   --  7.6* 7.9*  MG  --    < >  --  1.7  --  1.6* 2.0  PROT 7.3  --   --   --   --  5.4* 5.2*  ALBUMIN 3.3*  --   --   --   --  2.1* 1.9*  AST 34  --   --   --   --  31 24  ALT 19  --   --   --   --  23 13  ALKPHOS 68  --   --   --   --  40 49  BILITOT 3.1*  --   --   --   --  1.8* 2.2*  GFRNONAA 35*  --  43*  --   --  46* 50*  ANIONGAP 11  --  12  --   --  9 11   < > = values in this interval not displayed.    Lipids No results for input(s): "CHOL", "TRIG", "HDL", "LABVLDL", "LDLCALC", "CHOLHDL" in the last 168 hours.  Hematology Recent Labs  Lab 02/19/22 0347 02/20/22 0211 02/21/22 0537  WBC <0.1* <0.1* <0.1*  RBC 2.58* 2.46* 2.31*  HGB 7.9* 7.6* 7.2*  HCT 23.3* 21.4* 19.8*  MCV 90.3 87.0 85.7  MCH 30.6 30.9 31.2  MCHC 33.9 35.5 36.4*  RDW 18.2* 18.3* 18.8*  PLT 12* 17* 26*   Thyroid No results for input(s): "TSH", "FREET4" in the last 168 hours.  BNP Recent Labs  Lab 02/23/2022 0902 02/19/22 0347  BNP 370.0* 499.1*    DDimer No results for input(s): "DDIMER" in the last 168 hours.    Radiology    CT CHEST WO CONTRAST  Result Date: 02/20/2022 CLINICAL DATA:  Abnormal chest x-ray. Neutropenic fever patient on chemotherapy EXAM: CT CHEST WITHOUT CONTRAST TECHNIQUE: Multidetector CT imaging of the chest was performed following the standard protocol without IV contrast. RADIATION DOSE REDUCTION: This exam was performed according to the departmental dose-optimization program which includes automated exposure control, adjustment of the mA and/or kV according to patient size and/or use of iterative reconstruction technique. COMPARISON:  Chest x-ray February 19, 2022. CT of the chest December 02, 2021. FINDINGS: Cardiovascular: Calcified atherosclerotic changes identified in the left coronary arteries. Heart size is unremarkable. The thoracic aorta demonstrates minimal calcified atherosclerotic change. Central pulmonary arteries are normal caliber. Mediastinum/Nodes: A nodule is identified in the superior aspect of the left breast on series 4, image 32 measuring 8 mm, not identified on the CT scan dated December 02, 2021. Postoperative changes are identified in the left breast. Chest wall is otherwise unremarkable. No pleural or pericardial effusions. The esophagus and visualized thyroid are normal. No adenopathy. Lungs/Pleura: Central airways are normal. No pneumothorax. Bilateral patchy infiltrates are identified in the lungs consistent with multifocal pneumonia. No cavitation or complication identified. Upper Abdomen: The liver demonstrates a cirrhotic morphology with a nodular contour. Musculoskeletal: No chest wall mass or suspicious bone lesions identified. IMPRESSION: 1. Multifocal infiltrate worrisome for pneumonia. Recommend follow-up imaging after treatment to ensure resolution. 2. 8 mm nodule in the superior aspect of the left breast, not identified on the CT scan dated December 02, 2021. Recommend diagnostic mammography and ultrasound for further evaluation. 3. Calcified  atherosclerotic change in the left coronary arteries. 4. Calcified atherosclerotic change in the thoracic aorta. 5. Cirrhotic morphology of the liver. Aortic Atherosclerosis (ICD10-I70.0). Electronically Signed   By: Dorise Bullion III M.D.   On: 02/20/2022 09:53    Cardiac  Studies  TTE 02/18/22: IMPRESSIONS     1. Left ventricular ejection fraction, by estimation, is 50 to 55%. The  left ventricle has low normal function. The left ventricle has no regional  wall motion abnormalities. Left ventricular diastolic parameters are  indeterminate.   2. Right ventricular systolic function is normal. The right ventricular  size is normal. There is normal pulmonary artery systolic pressure.   3. The mitral valve was not well visualized. Moderate mitral valve  regurgitation.   4. The aortic valve is tricuspid. There is mild calcification of the  aortic valve. There is mild thickening of the aortic valve. Aortic valve  regurgitation is not visualized.   5. The inferior vena cava is normal in size with greater than 50%  respiratory variability, suggesting right atrial pressure of 3 mmHg.   Comparison(s): Prior images reviewed side by side. Increase in mitral  regurgitation.     Patient Profile     66 y.o. female with a hx of chronic atrial fibrillation, type 2 DM, HTN, AML, morbid obesity who presented with weakness, poor PO intake and falls found to be septic with staph bacteremia. Course complicated by Afib with RVR for which Cardiology was consulted.  Assessment & Plan    #Permanent atrial fibrillation:  Patient with known permanent A-fib who developed RVR in the setting of sepsis. Has been started on IV amiodarone for rate control given hypotension; unlikely chance of chemical conversion given permanent Afib and planned short course.  She is not a candidate for anticoagulation given severe anemia/thrombocytopenia -Plan will be for rate control, suspect HR will improve with treatment of her  sepsis -S/p dig load on 02/19/22; will check level and plan to start dig 0.'125mg'$  daily -Continue IV amiodarone for rate control for now (low risk of chemical cardioversion due to permanent Afib and patient cannot tolerate nodal agents due to hypotension) -Cannot tolerate AC due to severe thrombocytopenia and anemia -Cannot tolerate BB/dilt due to hypotension  #Acute on Chronic Diastolic HF: Appears mildly volume up on exam today in the setting of IVF administration for sepsis. TTE with LVEF 50-55%, no significant valve disease. IVF stopped. Will check BNP and give lasix '20mg'$  IV once and monitor response. -Stop IVF -Give lasix '20mg'$  IV x1 and monitor response -Check BNP -Unable to tolerate GDMT due to soft blood pressures  #Sepsis secondary to staph bacteremia: Presented with fever, neutropenia.  Found to have staph bacteremia with left neck cellulitis and multifocal PNA on CT. Now on cefazloin. TTE with LVEF 50-55%, normal RV, no valvular vegetations visualized. Not a candidate for TEE at this time due to severe thrombocytopenia.  -TTE without significant valvular pathology -Not currently a candidate for TEE given severe thrombocytopenia.  Need platelet count greater than 50   #Acute Myeloid Leukemia #Pancytopenia: -Management per Onc and primary  For questions or updates, please contact [place] Please consult [email or web address] for contact info under        Signed, Freada Bergeron, MD  02/21/2022, 10:13 AM

## 2022-02-20 NOTE — Consult Note (Addendum)
NAME:  Kaitlyn Hull, MRN:  [id number], DOB:  1955/09/08, LOS: 3 ADMISSION DATE:  02/02/2022, CONSULTATION DATE: 02/20/2022 REFERRING MD: [facility], CHIEF COMPLAINT: Questionable thoracentesis for fluid evaluation of aspergillosis.  History of Present Illness:  66 year old female with extensive past medical history is well-documented below and presents with chest x-ray and CT scan notable for multifocal pneumonia.  She has a history of AML and is receiving chemo therapy via [facility].  She was admitted treated with empirical broad-spectrum antimicrobial therapy and has improved since admission.  Pulmonary critical care asked to evaluate for possible thoracentesis but she is notable for platelets of 17 and on Eliquis at this time.  Pertinent  Medical History   Past Medical History:  Diagnosis Date   Atrial fibrillation with RVR ([place]) 06/2013   Breast cancer ([place]) 2007   left   Breast disorder    cancer   Diabetes mellitus    Type II   Dysrhythmia    Hematuria 01/20/2014   Hypertension    Hyperthyroidism 06/2013   Leukemia ([place])    Obesity    PMB (postmenopausal bleeding) 07/24/2012   Had 16.1 mm endometrium on [place] will get endo biopsy   Psoriasis    Urinary frequency 01/20/2014     Significant Hospital Events: Including procedures, antibiotic start and stop dates in addition to other pertinent events     Interim History / Subjective:  Admitted with multilobar pneumonia in the setting of AML  Objective   Blood pressure 118/70, pulse 100, temperature 97.7 F (36.5 C), temperature source Oral, resp. rate 18, height '5\' 2"'$  (1.575 m), weight 112.8 kg, last menstrual period 11/17/2014, SpO2 100 %.        Intake/Output Summary (Last 24 hours) at 02/20/2022 1134 Last data filed at 02/20/2022 7341 Gross per 24 hour  Intake 1910.76 ml  Output 1450 ml  Net 460.76 ml   Filed Weights   02/03/2022 0842 02/25/2022 2356  Weight: 117 kg 112.8 kg     Examination: General: Morbidly obese female sitting on side of the bed with no acute distress HENT: No JVD is appreciated Lungs: Mild rhonchi decreased in the bases Cardiovascular: Heart sounds are distant Abdomen: Obese soft positive bowel sounds Extremities: 2+ edema Neuro: Neurologically intact follows commands  Resolved Hospital Problem list     Assessment & Plan:  66 year old female who has a plethora of health issues most notable for AML diagnosed this year(11-04-2011) for which she receives treatment [facility], and [facility].  She was admitted with 2 days of generalized weakness without fevers chills or sweats.  She does have a CT scan notable for multifocal pneumonia.  She has been followed by infectious disease and have requested pulmonary evaluation for possible thoracentesis for fluid to rule out Aspergillus. Platelets are noted to be 17 She is on Eliquis for history of atrial fibrillation She has improved with admission and treatment with antimicrobial therapy She is sitting on the side of the bed in no acute distress. Questionable with risk at rate of benefits of attempting to do thoracentesis and some of the platelets of 17 this will be decided by the physicians staffing his case.   Thrombocytopenia Atrial fibrillation rapid trickle response History of sepsis with MSSA bacteremia Morbid obesity All other issues per primary and infectious disease and oncology. Best Practice (right click and "Reselect all SmartList Selections" daily)   Diet/type: Regular consistency (see orders) DVT prophylaxis: DOAC GI prophylaxis: PPI Lines:  N/A Foley:  N/A Code Status:  full code Last date of multidisciplinary goals of care discussion [tbd]  Labs   CBC: Recent Labs  Lab 02/08/2022 0845 02/18/22 0430 02/18/22 1035 02/18/22 2343 02/19/22 0347 02/20/22 0211  WBC 0.1* DUPL <0.1* <0.1* <0.1* <0.1*  NEUTROABS 0.0* PENDING  --   --   --    --   HGB 7.9* DUPL 6.6* 7.8* 7.9* 7.6*  HCT 23.0* DUPL 19.9* 22.3* 23.3* 21.4*  MCV 88.8 DUPL 90.9 88.5 90.3 87.0  PLT 7* DUPL 17* 13* 12* 17*    Basic Metabolic Panel: Recent Labs  Lab 02/05/2022 0845 03/03/2022 0902 02/18/22 0645 02/18/22 0646 02/18/22 1035 02/19/22 0347 02/20/22 0211  NA 130*  --  136  --   --  130* 134*  K 3.8  --  5.7*  --  4.1 3.2* 3.9  CL 98  --  108  --   --  101 106  CO2 21*  --  16*  --   --  20* 17*  GLUCOSE 223*  --  172*  --   --  108* 116*  BUN 27*  --  24*  --   --  21 19  CREATININE 1.61*  --  1.36*  --   --  1.30* 1.20*  CALCIUM 8.9  --  8.3*  --   --  7.6* 7.9*  MG  --  0.7*  --  1.7  --  1.6* 2.0  PHOS  --   --   --   --   --  3.1 2.3*   GFR: Estimated Creatinine Clearance: 55.5 mL/min (A) (by C-G formula based on SCr of 1.2 mg/dL (H)). Recent Labs  Lab 02/26/2022 0846 02/19/2022 1040 02/18/22 0430 02/18/22 1035 02/18/22 2343 02/19/22 0347 02/20/22 0211  WBC  --   --    < > <0.1* <0.1* <0.1* <0.1*  LATICACIDVEN 2.4* 2.0*  --   --   --   --   --    < > = values in this interval not displayed.    Liver Function Tests: Recent Labs  Lab 02/15/2022 0845 02/19/22 0347 02/20/22 0211  AST 34 31 24  ALT '19 23 13  '$ ALKPHOS 68 40 49  BILITOT 3.1* 1.8* 2.2*  PROT 7.3 5.4* 5.2*  ALBUMIN 3.3* 2.1* 1.9*   No results for input(s): "LIPASE", "AMYLASE" in the last 168 hours. No results for input(s): "AMMONIA" in the last 168 hours.  ABG No results found for: "PHART", "PCO2ART", "PO2ART", "HCO3", "TCO2", "ACIDBASEDEF", "O2SAT"   Coagulation Profile: No results for input(s): "INR", "PROTIME" in the last 168 hours.  Cardiac Enzymes: No results for input(s): "CKTOTAL", "CKMB", "CKMBINDEX", "TROPONINI" in the last 168 hours.  HbA1C: Hgb A1c MFr Bld  Date/Time Value Ref Range Status  02/18/2022 10:35 AM 5.6 4.8 - 5.6 % Final    Comment:    (NOTE)         Prediabetes: 5.7 - 6.4         Diabetes: >6.4         Glycemic control for adults  with diabetes: <7.0   10/26/2021 07:45 PM 6.2 (H) 4.8 - 5.6 % Final    Comment:    (NOTE)         Prediabetes: 5.7 - 6.4         Diabetes: >6.4         Glycemic control for adults with diabetes: <7.0     CBG: Recent Labs  Lab 02/19/22 1144 02/19/22 1545 02/19/22 2109 02/20/22 0748 02/20/22 1132  GLUCAP 131* 181* 138* 106* 106*    Review of Systems:   10 point review of system taken, please see HPI for positives and negatives.   Past Medical History:  She,  has a past medical history of Atrial fibrillation with RVR ([place]) (06/2013), Breast cancer ([place]) (2007), Breast disorder, Diabetes mellitus, Dysrhythmia, Hematuria (01/20/2014), Hypertension, Hyperthyroidism (06/2013), Leukemia ([place]), Obesity, PMB (postmenopausal bleeding) (07/24/2012), Psoriasis, and Urinary frequency (01/20/2014).   Surgical History:   Past Surgical History:  Procedure Laterality Date   BREAST BIOPSY Left 09/14/2020   Procedure: BREAST BIOPSY;  Surgeon: Aviva Signs, MD;  Location: [facility];  Service: General;  Laterality: Left;   BREAST SURGERY  02/20/06   left side    CESAREAN SECTION  1995   COLONOSCOPY WITH PROPOFOL N/A 10/28/2021   Procedure: COLONOSCOPY WITH PROPOFOL;  Surgeon: Daneil Dolin, MD;  Location: [facility];  Service: Endoscopy;  Laterality: N/A;   ESOPHAGOGASTRODUODENOSCOPY (EGD) WITH PROPOFOL N/A 10/28/2021   Procedure: ESOPHAGOGASTRODUODENOSCOPY (EGD) WITH PROPOFOL;  Surgeon: Daneil Dolin, MD;  Location: [facility];  Service: Endoscopy;  Laterality: N/A;   HYSTEROSCOPY WITH D & C N/A 08/13/2012   Procedure: DILATATION AND CURETTAGE /HYSTEROSCOPY;  Surgeon: Florian Buff, MD;  Location: [facility];  Service: Gynecology;  Laterality: N/A;   SECONDARY CLOSURE OF WOUND Left 02/20/2021   Procedure: SIMPLE WOUND CLOSURE;  Surgeon: Aviva Signs, MD;  Location: [facility];  Service: General;  Laterality: Left;     Social History:   reports that she quit smoking about 18 years ago. Her  smoking use included cigarettes. She started smoking about 45 years ago. She has a 0.01 pack-year smoking history. She has never used smokeless tobacco. She reports that she does not drink alcohol and does not use drugs.   Family History:  Her family history includes Alzheimer's disease in her mother; Breast cancer in her sister; CAD in her father; Diabetes in her sister and sister; Heart failure in her father; Hypertension in her maternal grandmother; Other in her sister. There is no history of Colon cancer.   Allergies No Known Allergies   Home Medications  Prior to Admission medications   Medication Sig Start Date End Date Taking? Authorizing Provider  acetaminophen (TYLENOL) 500 MG tablet Take 500 mg by mouth every 6 (six) hours as needed. Taken as needed for headache   Yes [provider]  ALPRAZolam (XANAX) 0.5 MG tablet Take 1 tablet by mouth 2 (two) times daily as needed for anxiety. 10/30/21 10/30/22 Yes [provider]  apixaban (ELIQUIS) 5 MG TABS tablet Take 2.5 mg by mouth 2 (two) times daily. 1/2 tablet BID   Yes [provider]  furosemide (LASIX) 20 MG tablet Take 1 tablet (20 mg total) by mouth daily as needed. Patient taking differently: Take 20 mg by mouth daily as needed for fluid. 11/30/21  Yes Derek Jack, MD  LORazepam (ATIVAN) 0.5 MG tablet Take 1 tablet (0.5 mg total) by mouth 2 (two) times daily as needed for anxiety. 12/19/21  Yes Derek Jack, MD  magnesium oxide (MAG-OX) 400 (240 Mg) MG tablet Take 1 tablet by mouth 2 (two) times daily. 12/26/21  Yes [provider]  metFORMIN (GLUCOPHAGE-XR) 500 MG 24 hr tablet Take 500-1,000 mg by mouth See admin instructions. Take 500 mg by mouth in the morning and 1000 mg at bedtime 03/26/18  Yes [provider]  metoprolol tartrate (  LOPRESSOR) 100 MG tablet Take 1 tablet (100 mg total) by mouth 2 (two) times daily. 11/23/21 11/18/22 Yes BranchAlphonse Guild, MD  pravastatin  (PRAVACHOL) 40 MG tablet Take 40 mg by mouth at bedtime.   Yes [provider]  venetoclax (VENCLEXTA) 100 MG tablet Take 400 mg by mouth daily. 01/23/22  Yes [provider]     Critical care time: Ferol Luz Durant Scibilia ACNP Acute Care Nurse Practitioner [place] Please consult [place] 02/20/2022, 11:36 AM

## 2022-02-20 NOTE — Progress Notes (Signed)
[facility] for Infectious Disease  Date of Admission:  02/20/2022     Total days of antibiotics 4         ASSESSMENT:  Kaitlyn Hull fever curve is down trending and she feels better. Blood cultures from 12/18 are without growth in 1 day. Given her pancytopenia related to her AML she is at increased risk for mold type infections including aspergillus. Serum testing for aspergillus ordered along with cryptococcus and histoplasma. Have asked pulmonology for consideration of bronchoscopy however she does have significant thrombocytopenia which is also preventing TEE.  Given her improvement thus far I think it is reasonable to continue with current dose of Cefazolin despite having unclear source and may need to consider prolonged course of treatment if unable to get TEE. Continue to monitor cultures for clearance of bacteremia. Remaining medical and supportive care per primary team.   PLAN:  Continue current dose of cefazolin. Monitor blood cultures for clearance of bacteremia. Await remaining fungal lab work. Pulmonology consulted.  Hold central line placement pending clearance of blood cultures.  Remaining medical and supportive care per primary team.   Principal Problem:   Atrial fibrillation with tachycardic ventricular rate (HCC) Active Problems:   Morbid obesity due to excess calories (HCC)   Chronic atrial fibrillation (HCC)   Acute Myeloid Leukemia/Abnormal blood smear/   Neutropenia (HCC)   Acute myeloid leukemia in adult [facility])   Neutropenic fever (HCC)   Sepsis (HCC)   MSSA bacteremia   Parotitis    sodium chloride   Intravenous Once   sodium chloride   Intravenous Once   sodium chloride   Intravenous Once   acetaminophen  1,000 mg Oral Q8H   feeding supplement (GLUCERNA SHAKE)  237 mL Oral BID BM   insulin aspart  0-15 Units Subcutaneous TID WC   magnesium oxide  400 mg Oral BID   metFORMIN  1,000 mg Oral Q supper   multivitamin with minerals  1 tablet Oral  Daily   pravastatin  40 mg Oral q1800    SUBJECTIVE:  Afebrile overnight with new onset headache which may be from the air blowing on her from the vents. Feeling better.   No Known Allergies   Review of Systems: Review of Systems  Constitutional:  Negative for chills, fever and weight loss.  Respiratory:  Negative for cough, shortness of breath and wheezing.   Cardiovascular:  Negative for chest pain and leg swelling.  Gastrointestinal:  Negative for abdominal pain, constipation, diarrhea, nausea and vomiting.  Skin:  Negative for rash.  Neurological:  Positive for headaches.      OBJECTIVE: Vitals:   02/20/22 0431 02/20/22 0749 02/20/22 1135 02/20/22 1200  BP: 106/89 118/70 116/77 104/78  Pulse: (!) 108 100 (!) 135 (!) 123  Resp:  '18 18 17  '$ Temp: (!) 97.3 F (36.3 C) 97.7 F (36.5 C) 97.6 F (36.4 C) 97.6 F (36.4 C)  TempSrc: Oral Oral Oral Oral  SpO2: 99% 100% 100% 100%  Weight:      Height:       Body mass index is 45.47 kg/m.  Physical Exam Constitutional:      General: She is not in acute distress.    Appearance: She is well-developed.     Comments: Seated on the side of the bed; pleasant.   Cardiovascular:     Rate and Rhythm: Regular rhythm. Tachycardia present.     Heart sounds: Normal heart sounds.  Pulmonary:     Effort: Pulmonary  effort is normal.     Breath sounds: Normal breath sounds.  Skin:    General: Skin is warm and dry.  Neurological:     Mental Status: She is alert and oriented to person, place, and time.  Psychiatric:        Mood and Affect: Mood normal.     Lab Results Lab Results  Component Value Date   WBC <0.1 (LL) 02/20/2022   HGB 7.6 (L) 02/20/2022   HCT 21.4 (L) 02/20/2022   MCV 87.0 02/20/2022   PLT 17 (LL) 02/20/2022    Lab Results  Component Value Date   CREATININE 1.20 (H) 02/20/2022   BUN 19 02/20/2022   NA 134 (L) 02/20/2022   K 3.9 02/20/2022   CL 106 02/20/2022   CO2 17 (L) 02/20/2022    Lab Results   Component Value Date   ALT 13 02/20/2022   AST 24 02/20/2022   ALKPHOS 49 02/20/2022   BILITOT 2.2 (H) 02/20/2022     Microbiology: Recent Results (from the past 240 hour(s))  Resp panel by RT-PCR (RSV, Flu A&B, Covid) Anterior Nasal Swab     Status: None   Collection Time: 02/14/2022  8:59 AM   Specimen: Anterior Nasal Swab  Result Value Ref Range Status   SARS Coronavirus 2 by RT PCR NEGATIVE NEGATIVE Final    Comment: (NOTE) SARS-CoV-2 target nucleic acids are NOT DETECTED.  The SARS-CoV-2 RNA is generally detectable in upper respiratory specimens during the acute phase of infection. The lowest concentration of SARS-CoV-2 viral copies this assay can detect is 138 copies/mL. A negative result does not preclude SARS-Cov-2 infection and should not be used as the sole basis for treatment or other patient management decisions. A negative result may occur with  improper specimen collection/handling, submission of specimen other than nasopharyngeal swab, presence of viral mutation(s) within the areas targeted by this assay, and inadequate number of viral copies(<138 copies/mL). A negative result must be combined with clinical observations, patient history, and epidemiological information. The expected result is Negative.  Fact Sheet for Patients:  EntrepreneurPulse.com.au  Fact Sheet for Healthcare Providers:  IncredibleEmployment.be  This test is no t yet approved or cleared by the [place] FDA and  has been authorized for detection and/or diagnosis of SARS-CoV-2 by FDA under an Emergency Use Authorization (EUA). This EUA will remain  in effect (meaning this test can be used) for the duration of the COVID-19 declaration under Section 564(b)(1) of the Act, 21 U.S.C.section 360bbb-3(b)(1), unless the authorization is terminated  or revoked sooner.       Influenza A by PCR NEGATIVE NEGATIVE Final   Influenza B by PCR NEGATIVE NEGATIVE  Final    Comment: (NOTE) The Xpert Xpress SARS-CoV-2/FLU/RSV plus assay is intended as an aid in the diagnosis of influenza from Nasopharyngeal swab specimens and should not be used as a sole basis for treatment. Nasal washings and aspirates are unacceptable for Xpert Xpress SARS-CoV-2/FLU/RSV testing.  Fact Sheet for Patients: EntrepreneurPulse.com.au  Fact Sheet for Healthcare Providers: IncredibleEmployment.be  This test is not yet approved or cleared by the [place] FDA and has been authorized for detection and/or diagnosis of SARS-CoV-2 by FDA under an Emergency Use Authorization (EUA). This EUA will remain in effect (meaning this test can be used) for the duration of the COVID-19 declaration under Section 564(b)(1) of the Act, 21 U.S.C. section 360bbb-3(b)(1), unless the authorization is terminated or revoked.     Resp Syncytial Virus by PCR NEGATIVE NEGATIVE  Final    Comment: (NOTE) Fact Sheet for Patients: EntrepreneurPulse.com.au  Fact Sheet for Healthcare Providers: IncredibleEmployment.be  This test is not yet approved or cleared by the [place] FDA and has been authorized for detection and/or diagnosis of SARS-CoV-2 by FDA under an Emergency Use Authorization (EUA). This EUA will remain in effect (meaning this test can be used) for the duration of the COVID-19 declaration under Section 564(b)(1) of the Act, 21 U.S.C. section 360bbb-3(b)(1), unless the authorization is terminated or revoked.  Performed at [facility], [address], [place], [place] [address]   Blood culture (routine x 2)     Status: Abnormal   Collection Time: 02/21/2022  3:19 PM   Specimen: BLOOD  Result Value Ref Range Status   Specimen Description BLOOD SITE NOT SPECIFIED  Final   Special Requests   Final    BOTTLES DRAWN AEROBIC AND ANAEROBIC Blood Culture results may not be optimal due to an inadequate volume  of blood received in culture bottles   Culture  Setup Time   Final    GRAM POSITIVE COCCI IN BOTH AEROBIC AND ANAEROBIC BOTTLES CRITICAL VALUE NOTED.  VALUE IS CONSISTENT WITH PREVIOUSLY REPORTED AND CALLED VALUE.    Culture (A)  Final    STAPHYLOCOCCUS AUREUS SUSCEPTIBILITIES PERFORMED ON PREVIOUS CULTURE WITHIN THE LAST 5 DAYS. Performed at [facility], [place] [address] [place], [place] [address]    Report Status 02/20/2022 FINAL  Final  Blood culture (routine x 2)     Status: Abnormal   Collection Time: 02/13/2022  3:24 PM   Specimen: BLOOD  Result Value Ref Range Status   Specimen Description BLOOD SITE NOT SPECIFIED  Final   Special Requests   Final    BOTTLES DRAWN AEROBIC AND ANAEROBIC Blood Culture adequate volume   Culture  Setup Time   Final    GRAM POSITIVE COCCI IN PAIRS IN CLUSTERS IN BOTH AEROBIC AND ANAEROBIC BOTTLES CRITICAL RESULT CALLED TO, READ BACK BY AND VERIFIED WITH: PHARMD [email or web address] AT 2119 ON 02/18/2022 BY T.SAAD. Performed at [facility], [place] [address] [place], [place] [address]    Culture STAPHYLOCOCCUS AUREUS (A)  Final   Report Status 02/20/2022 FINAL  Final   Organism ID, Bacteria STAPHYLOCOCCUS AUREUS  Final      Susceptibility   Staphylococcus aureus - MIC*    CIPROFLOXACIN <=0.5 SENSITIVE Sensitive     ERYTHROMYCIN <=0.25 SENSITIVE Sensitive     GENTAMICIN <=0.5 SENSITIVE Sensitive     OXACILLIN <=0.25 SENSITIVE Sensitive     TETRACYCLINE <=1 SENSITIVE Sensitive     VANCOMYCIN 1 SENSITIVE Sensitive     TRIMETH/SULFA <=10 SENSITIVE Sensitive     CLINDAMYCIN <=0.25 SENSITIVE Sensitive     RIFAMPIN <=0.5 SENSITIVE Sensitive     Inducible Clindamycin NEGATIVE Sensitive     * STAPHYLOCOCCUS AUREUS  Blood Culture ID Panel (Reflexed)     Status: Abnormal   Collection Time: 02/22/2022  3:24 PM  Result Value Ref Range Status   Enterococcus faecalis NOT DETECTED NOT DETECTED Final   Enterococcus Faecium NOT DETECTED NOT DETECTED Final    Listeria monocytogenes NOT DETECTED NOT DETECTED Final   Staphylococcus species DETECTED (A) NOT DETECTED Final    Comment: CRITICAL RESULT CALLED TO, READ BACK BY AND VERIFIED WITH: PHARMD [email or web address] AT 8144 ON 02/18/2022 BY T.SAAD.    Staphylococcus aureus (BCID) DETECTED (A) NOT DETECTED Final    Comment: CRITICAL RESULT CALLED TO, READ BACK BY AND VERIFIED WITH:  PHARMD [email or web address] AT 0092 ON 02/18/2022 BY T.SAAD.    Staphylococcus epidermidis NOT DETECTED NOT DETECTED Final   Staphylococcus lugdunensis NOT DETECTED NOT DETECTED Final   Streptococcus species NOT DETECTED NOT DETECTED Final   Streptococcus agalactiae NOT DETECTED NOT DETECTED Final   Streptococcus pneumoniae NOT DETECTED NOT DETECTED Final   Streptococcus pyogenes NOT DETECTED NOT DETECTED Final   A.calcoaceticus-baumannii NOT DETECTED NOT DETECTED Final   Bacteroides fragilis NOT DETECTED NOT DETECTED Final   Enterobacterales NOT DETECTED NOT DETECTED Final   Enterobacter cloacae complex NOT DETECTED NOT DETECTED Final   Escherichia coli NOT DETECTED NOT DETECTED Final   Klebsiella aerogenes NOT DETECTED NOT DETECTED Final   Klebsiella oxytoca NOT DETECTED NOT DETECTED Final   Klebsiella pneumoniae NOT DETECTED NOT DETECTED Final   Proteus species NOT DETECTED NOT DETECTED Final   Salmonella species NOT DETECTED NOT DETECTED Final   Serratia marcescens NOT DETECTED NOT DETECTED Final   Haemophilus influenzae NOT DETECTED NOT DETECTED Final   Neisseria meningitidis NOT DETECTED NOT DETECTED Final   Pseudomonas aeruginosa NOT DETECTED NOT DETECTED Final   Stenotrophomonas maltophilia NOT DETECTED NOT DETECTED Final   Candida albicans NOT DETECTED NOT DETECTED Final   Candida auris NOT DETECTED NOT DETECTED Final   Candida glabrata NOT DETECTED NOT DETECTED Final   Candida krusei NOT DETECTED NOT DETECTED Final   Candida parapsilosis NOT DETECTED NOT DETECTED Final   Candida tropicalis NOT DETECTED NOT DETECTED Final    Cryptococcus neoformans/gattii NOT DETECTED NOT DETECTED Final   Meth resistant mecA/C and MREJ NOT DETECTED NOT DETECTED Final    Comment: Performed at [facility], [address] [place], [place] [address]  Culture, body fluid w Gram Stain-bottle     Status: None (Preliminary result)   Collection Time: 02/16/2022  6:25 PM   Specimen: Fluid  Result Value Ref Range Status   Specimen Description FLUID  Final   Special Requests   Final    BOTTLES DRAWN AEROBIC AND ANAEROBIC Blood Culture results may not be optimal due to an excessive volume of blood received in culture bottles   Culture   Final    NO GROWTH 3 DAYS Performed at [facility], [place] [address] [place], [place] [address]    Report Status PENDING  Incomplete  Gram stain     Status: None   Collection Time: 02/09/2022  6:26 PM   Specimen: Fluid  Result Value Ref Range Status   Specimen Description FLUID  Final   Special Requests NONE  Final   Gram Stain   Final    NO ORGANISMS SEEN NO WBC SEEN Performed at [facility], [place] [address] [place], [place] [address]    Report Status 03/04/2022 FINAL  Final  Respiratory (~20 pathogens) panel by PCR     Status: None   Collection Time: 02/18/22 10:58 AM   Specimen: Nasopharyngeal Swab; Respiratory  Result Value Ref Range Status   Adenovirus NOT DETECTED NOT DETECTED Final   Coronavirus 229E NOT DETECTED NOT DETECTED Final    Comment: (NOTE) The Coronavirus on the Respiratory Panel, DOES NOT test for the novel  Coronavirus (2019 nCoV)    Coronavirus HKU1 NOT DETECTED NOT DETECTED Final   Coronavirus NL63 NOT DETECTED NOT DETECTED Final   Coronavirus OC43 NOT DETECTED NOT DETECTED Final   Metapneumovirus NOT DETECTED NOT DETECTED Final   Rhinovirus / Enterovirus NOT DETECTED NOT DETECTED Final   Influenza A NOT DETECTED NOT DETECTED  Final   Influenza B NOT DETECTED NOT DETECTED Final   Parainfluenza Virus 1 NOT DETECTED NOT DETECTED Final   Parainfluenza  Virus 2 NOT DETECTED NOT DETECTED Final   Parainfluenza Virus 3 NOT DETECTED NOT DETECTED Final   Parainfluenza Virus 4 NOT DETECTED NOT DETECTED Final   Respiratory Syncytial Virus NOT DETECTED NOT DETECTED Final   Bordetella pertussis NOT DETECTED NOT DETECTED Final   Bordetella Parapertussis NOT DETECTED NOT DETECTED Final   Chlamydophila pneumoniae NOT DETECTED NOT DETECTED Final   Mycoplasma pneumoniae NOT DETECTED NOT DETECTED Final    Comment: Performed at [facility], [place] [address], [place], [place] [address]  Culture, blood (Routine X 2) w Reflex to ID Panel     Status: None (Preliminary result)   Collection Time: 02/19/22  5:54 AM   Specimen: BLOOD  Result Value Ref Range Status   Specimen Description BLOOD BLOOD LEFT ARM  Final   Special Requests   Final    BOTTLES DRAWN AEROBIC ONLY Blood Culture adequate volume   Culture   Final    NO GROWTH 1 DAY Performed at [facility], [place] [address], [place], [place] [address]    Report Status PENDING  Incomplete  Culture, blood (Routine X 2) w Reflex to ID Panel     Status: None (Preliminary result)   Collection Time: 02/19/22  5:54 AM   Specimen: BLOOD  Result Value Ref Range Status   Specimen Description BLOOD BLOOD LEFT HAND  Final   Special Requests   Final    BOTTLES DRAWN AEROBIC ONLY Blood Culture adequate volume   Culture   Final    NO GROWTH 1 DAY Performed at [facility], [place] [address] [place], [place] [address]    Report Status PENDING  Incomplete     Terri Piedra, [place] [facility]  02/20/2022  1:03 PM

## 2022-02-21 DIAGNOSIS — D6869 Other thrombophilia: Secondary | ICD-10-CM

## 2022-02-21 DIAGNOSIS — N179 Acute kidney failure, unspecified: Secondary | ICD-10-CM

## 2022-02-21 DIAGNOSIS — T451X5A Adverse effect of antineoplastic and immunosuppressive drugs, initial encounter: Secondary | ICD-10-CM

## 2022-02-21 DIAGNOSIS — A4101 Sepsis due to Methicillin susceptible Staphylococcus aureus: Secondary | ICD-10-CM

## 2022-02-21 DIAGNOSIS — D701 Agranulocytosis secondary to cancer chemotherapy: Secondary | ICD-10-CM

## 2022-02-21 DIAGNOSIS — I482 Chronic atrial fibrillation, unspecified: Secondary | ICD-10-CM | POA: Diagnosis not present

## 2022-02-21 DIAGNOSIS — I5033 Acute on chronic diastolic (congestive) heart failure: Secondary | ICD-10-CM | POA: Diagnosis not present

## 2022-02-21 DIAGNOSIS — R6520 Severe sepsis without septic shock: Secondary | ICD-10-CM

## 2022-02-21 DIAGNOSIS — I4891 Unspecified atrial fibrillation: Secondary | ICD-10-CM | POA: Diagnosis not present

## 2022-02-21 DIAGNOSIS — R7881 Bacteremia: Secondary | ICD-10-CM | POA: Diagnosis not present

## 2022-02-21 LAB — CBC
HCT: 28 % — ABNORMAL LOW (ref 36.0–46.0)
Hemoglobin: 10 g/dL — ABNORMAL LOW (ref 12.0–15.0)
MCH: 30.5 pg (ref 26.0–34.0)
MCHC: 35.7 g/dL (ref 30.0–36.0)
MCV: 85.4 fL (ref 80.0–100.0)
Platelets: 20 10*3/uL — CL (ref 150–400)
RBC: 3.28 MIL/uL — ABNORMAL LOW (ref 3.87–5.11)
RDW: 17.8 % — ABNORMAL HIGH (ref 11.5–15.5)
WBC: 0.1 10*3/uL — CL (ref 4.0–10.5)
nRBC: 0 % (ref 0.0–0.2)

## 2022-02-21 LAB — BPAM PLATELET PHERESIS
Blood Product Expiration Date: 202312172359
Blood Product Expiration Date: 202312172359
Blood Product Expiration Date: 202312182359
Blood Product Expiration Date: 202312192359
Blood Product Expiration Date: 202312202359
Blood Product Expiration Date: 202312222359
ISSUE DATE / TIME: 202312161329
ISSUE DATE / TIME: 202312171458
ISSUE DATE / TIME: 202312171738
ISSUE DATE / TIME: 202312191137
ISSUE DATE / TIME: 202312191455
Unit Type and Rh: 600
Unit Type and Rh: 6200
Unit Type and Rh: 6200
Unit Type and Rh: 8400
Unit Type and Rh: 6200
Unit Type and Rh: 6200

## 2022-02-21 LAB — BASIC METABOLIC PANEL
Anion gap: 9 (ref 5–15)
BUN: 17 mg/dL (ref 8–23)
CO2: 19 mmol/L — ABNORMAL LOW (ref 22–32)
Calcium: 7.5 mg/dL — ABNORMAL LOW (ref 8.9–10.3)
Chloride: 109 mmol/L (ref 98–111)
Creatinine, Ser: 1.02 mg/dL — ABNORMAL HIGH (ref 0.44–1.00)
GFR, Estimated: >60 mL/min (ref >60)
Glucose, Bld: 157 mg/dL — ABNORMAL HIGH (ref 70–99)
Potassium: 2.8 mmol/L — ABNORMAL LOW (ref 3.5–5.1)
Sodium: 137 mmol/L (ref 135–145)

## 2022-02-21 LAB — CBC WITH DIFFERENTIAL/PLATELET
HCT: 21.4 % — ABNORMAL LOW (ref 36.0–46.0)
HCT: 19.8 % — ABNORMAL LOW (ref 36.0–46.0)
Hemoglobin: 7.6 g/dL — ABNORMAL LOW (ref 12.0–15.0)
Hemoglobin: 7.2 g/dL — ABNORMAL LOW (ref 12.0–15.0)
MCH: 30.9 pg (ref 26.0–34.0)
MCH: 31.2 pg (ref 26.0–34.0)
MCHC: 35.5 g/dL (ref 30.0–36.0)
MCHC: 36.4 g/dL — ABNORMAL HIGH (ref 30.0–36.0)
MCV: 87 fL (ref 80.0–100.0)
MCV: 85.7 fL (ref 80.0–100.0)
Platelets: 17 10*3/uL — CL (ref 150–400)
Platelets: 26 10*3/uL — CL (ref 150–400)
RBC: 2.46 MIL/uL — ABNORMAL LOW (ref 3.87–5.11)
RBC: 2.31 MIL/uL — ABNORMAL LOW (ref 3.87–5.11)
RDW: 18.3 % — ABNORMAL HIGH (ref 11.5–15.5)
RDW: 18.8 % — ABNORMAL HIGH (ref 11.5–15.5)
WBC: <0.1 10*3/uL — CL (ref 4.0–10.5)
WBC: <0.1 10*3/uL — CL (ref 4.0–10.5)
nRBC: 0 % (ref 0.0–0.2)
nRBC: 0 % (ref 0.0–0.2)

## 2022-02-21 LAB — PREPARE PLATELET PHERESIS
Unit division: 0
Unit division: 0
Unit division: 0
Unit division: 0
Unit division: 0
Unit division: 0

## 2022-02-21 LAB — GLUCOSE, CAPILLARY
Glucose-Capillary: 105 mg/dL — ABNORMAL HIGH (ref 70–99)
Glucose-Capillary: 108 mg/dL — ABNORMAL HIGH (ref 70–99)
Glucose-Capillary: 97 mg/dL (ref 70–99)
Glucose-Capillary: 97 mg/dL (ref 70–99)

## 2022-02-21 LAB — DIGOXIN LEVEL: Digoxin Level: 1 ng/mL (ref 0.8–2.0)

## 2022-02-21 LAB — BRAIN NATRIURETIC PEPTIDE: B Natriuretic Peptide: 791.8 pg/mL — ABNORMAL HIGH (ref 0.0–100.0)

## 2022-02-21 LAB — PREPARE RBC (CROSSMATCH)

## 2022-02-21 MED ORDER — FUROSEMIDE 10 MG/ML IJ SOLN
20.0000 mg | Freq: Once | INTRAMUSCULAR | Status: AC
  Administered 2022-02-21: 20 mg via INTRAVENOUS
  Filled 2022-02-21: qty 2

## 2022-02-21 MED ORDER — SODIUM CHLORIDE 0.9% IV SOLUTION
Freq: Once | INTRAVENOUS | Status: AC
  Administered 2022-02-21: 250 mL via INTRAVENOUS

## 2022-02-21 MED ORDER — METHOCARBAMOL 1000 MG/10ML IJ SOLN
500.0000 mg | Freq: Four times a day (QID) | INTRAVENOUS | Status: DC | PRN
  Administered 2022-02-21 – 2022-02-22 (×3): 500 mg via INTRAVENOUS
  Filled 2022-02-21 (×2): qty 5
  Filled 2022-02-21: qty 500

## 2022-02-21 MED ORDER — MORPHINE SULFATE (PF) 2 MG/ML IV SOLN
1.0000 mg | Freq: Once | INTRAVENOUS | Status: AC
  Administered 2022-02-21: 1 mg via INTRAVENOUS
  Filled 2022-02-21: qty 1

## 2022-02-21 MED ORDER — POTASSIUM CHLORIDE CRYS ER 20 MEQ PO TBCR
40.0000 meq | EXTENDED_RELEASE_TABLET | Freq: Two times a day (BID) | ORAL | Status: AC
  Administered 2022-02-21 (×2): 40 meq via ORAL
  Filled 2022-02-21 (×2): qty 2

## 2022-02-21 NOTE — Progress Notes (Signed)
[facility] PROGRESS NOTE  Kaitlyn Hull (DOB: 23-Sep-1955) JHE:[id number] PCP: Ledell Noss[facility]  Brief Narrative: Kaitlyn Hull is a 66 year old female with a history of chronic atrial fibrillation, diabetes mellitus type 2, hypertension, AML morbid obesity presenting with 2-day history of generalized weakness, decreased oral intake, and falling.  She has been found to have MSSA bacteremia in setting of parotitis and multifocal pneumonia with febrile neutropenia and pancytopenia.     See below for additional details   Subjective: Had right mid/lower back pain worsen overnight. Does not radiate, no weakness/numbness, does not cross to/over midline. No trauma. No fever. No chest pain, +dyspnea.   Objective: BP 119/67 (BP Location: Right Wrist)   Pulse (!) 101   Temp (!) 97.3 F (36.3 C) (Oral)   Resp 16   Ht _0  (1.575 m)   Wt 112.8 kg   LMP 11/17/2014   SpO2 94%   BMI 45.47 kg/m   Gen: No distress Pulm: Crackles at bases, no elevated WOB  CV: Irregular tachycardia without MRG, +edema GI: Soft, NT, ND, +BS  Neuro: Alert and oriented. No new focal deficits. Ext: Warm, no deformities Skin: No new rashes, lesions or ulcers on visualized skin. Left parotid area with induration without fluctuance.  Assessment & Plan: Sepsis due to MSSA bacteremia: Pansensitive based on 12/16 Cx. Left Sided Parotitis and Cellulitis of Left Sided of Face Multifocal Pneumonia Febrile Neutropenia Sepsis ruled in with fever, neutropenia, tachycardia, tachypnea Workup thus far revealing for L neck skin soft tissue infection, no clear fluctuance on exam, lower suspicion for abscess - Blood Cx 12/18 NGTD. No vegetation on TTE. TEE contraindicated with degree of thrombocytopenia.  - Line holiday, PICC line removed 12/17 - UA with nitrite, many bacteria -> follow urine culture (pending) - CT chest concerning for multifocal pneumonia > needs follow up imaging after treatment to ensure  resolution - ID consulted > continue ancef x6 weeks - PCCM consulted, deferring bronch/BAL for now.  - Follow up endemic fungi, aspergillus serology. Negative crypto Ag.    Acute Myeloid Leukemia, pancytopenia, symptomatic anemia: Co-managed at [facility] by Dr. Jerrye Noble and [facility] with Dr. Delton Coombes.  - Receiving venetoclax/decitabine (last cycle started 11/27).  Currently holding venetoclax. - S/p 6 units platelets (12/16, 12/17, 12/18x2, 12/19x2) - S/p 3u PRBCs (12/17x2, 12/20) - Continue monitoring. Giving 1u RBC today.  - Transfuse leukocyte-reduced and irradiated blood products for Hb <8 and platelets <20    Right paraspinal thoracolumbar spasm: - Robaxin prn. No radicular symptoms or midline tenderness/deformity/drop off, will manage supportively for now, defer imaging.  Permanent atrial fibrillation with RVR - Digoxin loaded, will start daily per my discussion with Dr. Johney Frame.  - Continue amiodarone gtt.   AKI: Due to sepsis, continues improvement.  - Will stop IVF, diurese as above.    Hypokalemia:  - Supplemented, will recheck in AM. Continue standing Mg supp as well.    T2DM: HbA1c 6.2%.  - Hold metformin - SSI    8 mm nodule in superior aspect of L breast - Recommend outpatient mammogram and US   Obesity: Body mass index is 45.47 kg/m.   Kaitlyn Pour, MD [facility] [email or web address] 02/21/2022, 5:03 PM

## 2022-02-21 NOTE — Progress Notes (Signed)
Pharmacy Antibiotic Note  Kaitlyn Hull is a 66 y.o. female admitted on 02/14/2022 with AFib RVR and found to be bacteremic with MSSA. Pt has pancytopenia due to AML. Repeat blood cultures are negative, fungal serologies pending, Cr stable.  Plan: Cefazolin  2g IV every 8 hours    Height: '5\' 2"'$  (157.5 cm) Weight: 112.8 kg (248 lb 9.6 oz) IBW/kg (Calculated) : 50.1  Temp (24hrs), Avg:97.9 F (36.6 C), Min:97.6 F (36.4 C), Max:98.7 F (37.1 C)  Recent Labs  Lab 02/08/2022 0845 02/07/2022 0846 02/08/2022 1040 02/18/22 0430 02/18/22 0645 02/18/22 1035 02/18/22 2343 02/19/22 0347 02/20/22 0211  WBC 0.1*  --   --  DUPL  --  <0.1* <0.1* <0.1* <0.1*  CREATININE 1.61*  --   --   --  1.36*  --   --  1.30* 1.20*  LATICACIDVEN  --  2.4* 2.0*  --   --   --   --   --   --      Estimated Creatinine Clearance: 55.5 mL/min (A) (by C-G formula based on SCr of 1.2 mg/dL (H)).    No Known Allergies  Antimicrobials this admission: Cefepime 12/16>>12/17 Vanc 12/16>>12/17 Flagyl 12/17>> 12/17 Cefazolin: 12/17>>  Microbiology results: 12/16 BCx: MSSA 12/16 Fluid Cx: NGTD 12/18 BCx: NGTD  Thank you for allowing pharmacy to be a part of this patient's care.  Arrie Senate, PharmD, BCPS, [facility] Clinical Pharmacist [phone] Please check AMION for all [facility] numbers 02/21/2022

## 2022-02-22 ENCOUNTER — Inpatient Hospital Stay (HOSPITAL_COMMUNITY): Payer: PPO

## 2022-02-22 ENCOUNTER — Inpatient Hospital Stay: Payer: Self-pay

## 2022-02-22 DIAGNOSIS — K1120 Sialoadenitis, unspecified: Secondary | ICD-10-CM | POA: Diagnosis not present

## 2022-02-22 DIAGNOSIS — I4891 Unspecified atrial fibrillation: Secondary | ICD-10-CM | POA: Diagnosis not present

## 2022-02-22 DIAGNOSIS — M545 Low back pain, unspecified: Secondary | ICD-10-CM

## 2022-02-22 DIAGNOSIS — I5033 Acute on chronic diastolic (congestive) heart failure: Secondary | ICD-10-CM | POA: Diagnosis not present

## 2022-02-22 DIAGNOSIS — J9601 Acute respiratory failure with hypoxia: Secondary | ICD-10-CM

## 2022-02-22 DIAGNOSIS — C9200 Acute myeloblastic leukemia, not having achieved remission: Secondary | ICD-10-CM | POA: Diagnosis not present

## 2022-02-22 DIAGNOSIS — B9561 Methicillin susceptible Staphylococcus aureus infection as the cause of diseases classified elsewhere: Secondary | ICD-10-CM | POA: Diagnosis not present

## 2022-02-22 DIAGNOSIS — R7881 Bacteremia: Secondary | ICD-10-CM | POA: Diagnosis not present

## 2022-02-22 LAB — TYPE AND SCREEN
ABO/RH(D): A POS
Antibody Screen: NEGATIVE
Unit division: 0
Unit division: 0
Unit division: 0

## 2022-02-22 LAB — COMPREHENSIVE METABOLIC PANEL
ALT: <5 U/L (ref 0–44)
AST: 13 U/L — ABNORMAL LOW (ref 15–41)
Albumin: 1.7 g/dL — ABNORMAL LOW (ref 3.5–5.0)
Alkaline Phosphatase: 71 U/L (ref 38–126)
Anion gap: 11 (ref 5–15)
BUN: 24 mg/dL — ABNORMAL HIGH (ref 8–23)
CO2: 20 mmol/L — ABNORMAL LOW (ref 22–32)
Calcium: 8.9 mg/dL (ref 8.9–10.3)
Chloride: 105 mmol/L (ref 98–111)
Creatinine, Ser: 1.3 mg/dL — ABNORMAL HIGH (ref 0.44–1.00)
GFR, Estimated: 46 mL/min — ABNORMAL LOW (ref >60)
Glucose, Bld: 109 mg/dL — ABNORMAL HIGH (ref 70–99)
Potassium: 4.2 mmol/L (ref 3.5–5.1)
Sodium: 136 mmol/L (ref 135–145)
Total Bilirubin: 1.3 mg/dL — ABNORMAL HIGH (ref 0.3–1.2)
Total Protein: 5.6 g/dL — ABNORMAL LOW (ref 6.5–8.1)

## 2022-02-22 LAB — BPAM RBC
Blood Product Expiration Date: 202401012359
Blood Product Expiration Date: 202401142359
Blood Product Expiration Date: 202401142359
ISSUE DATE / TIME: 202312171604
ISSUE DATE / TIME: 202312172006
ISSUE DATE / TIME: 202312201226
Unit Type and Rh: 6200
Unit Type and Rh: 6200
Unit Type and Rh: 6200

## 2022-02-22 LAB — BASIC METABOLIC PANEL
Anion gap: 9 (ref 5–15)
BUN: 19 mg/dL (ref 8–23)
CO2: 20 mmol/L — ABNORMAL LOW (ref 22–32)
Calcium: 8.1 mg/dL — ABNORMAL LOW (ref 8.9–10.3)
Chloride: 106 mmol/L (ref 98–111)
Creatinine, Ser: 1.15 mg/dL — ABNORMAL HIGH (ref 0.44–1.00)
GFR, Estimated: 53 mL/min — ABNORMAL LOW (ref >60)
Glucose, Bld: 103 mg/dL — ABNORMAL HIGH (ref 70–99)
Potassium: 3.6 mmol/L (ref 3.5–5.1)
Sodium: 135 mmol/L (ref 135–145)

## 2022-02-22 LAB — CBC WITH DIFFERENTIAL/PLATELET
Abs Immature Granulocytes: 0.06 10*3/uL (ref 0.00–0.07)
Basophils Absolute: 0 10*3/uL (ref 0.0–0.1)
Basophils Relative: 11 %
Eosinophils Absolute: 0 10*3/uL (ref 0.0–0.5)
Eosinophils Relative: 0 %
HCT: 23.6 % — ABNORMAL LOW (ref 36.0–46.0)
Hemoglobin: 8.8 g/dL — ABNORMAL LOW (ref 12.0–15.0)
Immature Granulocytes: 31 %
Lymphocytes Relative: 37 %
Lymphs Abs: 0.1 10*3/uL — ABNORMAL LOW (ref 0.7–4.0)
MCH: 31.3 pg (ref 26.0–34.0)
MCHC: 37.3 g/dL — ABNORMAL HIGH (ref 30.0–36.0)
MCV: 84 fL (ref 80.0–100.0)
Monocytes Absolute: 0 10*3/uL — ABNORMAL LOW (ref 0.1–1.0)
Monocytes Relative: 16 %
Neutro Abs: 0 10*3/uL — CL (ref 1.7–7.7)
Neutrophils Relative %: 5 %
Platelets: 13 10*3/uL — CL (ref 150–400)
RBC: 2.81 MIL/uL — ABNORMAL LOW (ref 3.87–5.11)
RDW: 18.1 % — ABNORMAL HIGH (ref 11.5–15.5)
WBC: 0.2 10*3/uL — CL (ref 4.0–10.5)
nRBC: 0 % (ref 0.0–0.2)

## 2022-02-22 LAB — CBC
HCT: 26.3 % — ABNORMAL LOW (ref 36.0–46.0)
Hemoglobin: 9.3 g/dL — ABNORMAL LOW (ref 12.0–15.0)
MCH: 30.5 pg (ref 26.0–34.0)
MCHC: 35.4 g/dL (ref 30.0–36.0)
MCV: 86.2 fL (ref 80.0–100.0)
Platelets: 17 10*3/uL — CL (ref 150–400)
RBC: 3.05 MIL/uL — ABNORMAL LOW (ref 3.87–5.11)
RDW: 18.7 % — ABNORMAL HIGH (ref 11.5–15.5)
WBC: <0.1 10*3/uL — CL (ref 4.0–10.5)
nRBC: 0 % (ref 0.0–0.2)

## 2022-02-22 LAB — PHOSPHORUS: Phosphorus: 3.8 mg/dL (ref 2.5–4.6)

## 2022-02-22 LAB — GLUCOSE, CAPILLARY
Glucose-Capillary: 118 mg/dL — ABNORMAL HIGH (ref 70–99)
Glucose-Capillary: 122 mg/dL — ABNORMAL HIGH (ref 70–99)
Glucose-Capillary: 106 mg/dL — ABNORMAL HIGH (ref 70–99)
Glucose-Capillary: 99 mg/dL (ref 70–99)
Glucose-Capillary: 117 mg/dL — ABNORMAL HIGH (ref 70–99)

## 2022-02-22 LAB — MAGNESIUM
Magnesium: 1.8 mg/dL (ref 1.7–2.4)
Magnesium: 1.9 mg/dL (ref 1.7–2.4)

## 2022-02-22 LAB — CULTURE, BODY FLUID W GRAM STAIN -BOTTLE: Culture: NO GROWTH

## 2022-02-22 LAB — MRSA NEXT GEN BY PCR, NASAL: MRSA by PCR Next Gen: NOT DETECTED

## 2022-02-22 MED ORDER — POTASSIUM CHLORIDE CRYS ER 20 MEQ PO TBCR
40.0000 meq | EXTENDED_RELEASE_TABLET | Freq: Once | ORAL | Status: AC
  Administered 2022-02-22: 40 meq via ORAL
  Filled 2022-02-22: qty 2

## 2022-02-22 MED ORDER — HYDROMORPHONE HCL 1 MG/ML IJ SOLN: 0.3000 mg | INTRAMUSCULAR | Status: DC | PRN

## 2022-02-22 MED ORDER — ORAL CARE MOUTH RINSE: 15.0000 mL | OROMUCOSAL | Status: DC | PRN

## 2022-02-22 MED ORDER — SODIUM CHLORIDE 0.9% FLUSH: 10.0000 mL | INTRAVENOUS | Status: DC | PRN

## 2022-02-22 MED ORDER — CHLORHEXIDINE GLUCONATE CLOTH 2 % EX PADS
6.0000 | MEDICATED_PAD | Freq: Every day | CUTANEOUS | Status: DC
  Administered 2022-02-22 – 2022-02-24 (×3): 6 via TOPICAL

## 2022-02-22 MED ORDER — METOPROLOL TARTRATE 5 MG/5ML IV SOLN
2.5000 mg | Freq: Once | INTRAVENOUS | Status: AC
  Administered 2022-02-22: 2.5 mg via INTRAVENOUS
  Filled 2022-02-22: qty 5

## 2022-02-22 MED ORDER — POTASSIUM CHLORIDE CRYS ER 20 MEQ PO TBCR
20.0000 meq | EXTENDED_RELEASE_TABLET | Freq: Two times a day (BID) | ORAL | Status: AC
  Administered 2022-02-22: 20 meq via ORAL
  Filled 2022-02-22: qty 1

## 2022-02-22 MED ORDER — OXYCODONE HCL 5 MG PO TABS
5.0000 mg | ORAL_TABLET | Freq: Four times a day (QID) | ORAL | Status: DC | PRN
  Administered 2022-02-22: 5 mg via ORAL
  Filled 2022-02-22: qty 1

## 2022-02-22 MED ORDER — LORAZEPAM 2 MG/ML IJ SOLN: 0.5000 mg | Freq: Three times a day (TID) | INTRAMUSCULAR | Status: DC | PRN

## 2022-02-22 MED ORDER — DIGOXIN 125 MCG PO TABS
0.1250 mg | ORAL_TABLET | Freq: Every day | ORAL | Status: DC
  Administered 2022-02-22 – 2022-02-23 (×2): 0.125 mg via ORAL
  Filled 2022-02-22 (×2): qty 1

## 2022-02-22 MED ORDER — AMIODARONE IV BOLUS ONLY 150 MG/100ML
150.0000 mg | Freq: Once | INTRAVENOUS | Status: AC
  Administered 2022-02-22: 150 mg via INTRAVENOUS
  Filled 2022-02-22: qty 100

## 2022-02-22 MED ORDER — FUROSEMIDE 10 MG/ML IJ SOLN
40.0000 mg | Freq: Once | INTRAMUSCULAR | Status: AC
  Administered 2022-02-22: 40 mg via INTRAVENOUS
  Filled 2022-02-22: qty 4

## 2022-02-22 MED ORDER — SODIUM CHLORIDE 0.9% FLUSH
10.0000 mL | Freq: Two times a day (BID) | INTRAVENOUS | Status: DC
  Administered 2022-02-22 – 2022-02-23 (×3): 10 mL

## 2022-02-22 MED ORDER — LORAZEPAM 2 MG/ML IJ SOLN
0.5000 mg | Freq: Two times a day (BID) | INTRAMUSCULAR | Status: DC | PRN
  Administered 2022-02-22: 0.5 mg via INTRAVENOUS
  Filled 2022-02-22: qty 1

## 2022-02-22 MED ORDER — ORAL CARE MOUTH RINSE
15.0000 mL | OROMUCOSAL | Status: DC
  Administered 2022-02-22 – 2022-02-23 (×4): 15 mL via OROMUCOSAL

## 2022-02-22 MED ORDER — SODIUM CHLORIDE 0.9% IV SOLUTION: Freq: Once | INTRAVENOUS | Status: AC

## 2022-02-22 MED ORDER — CHLORHEXIDINE GLUCONATE CLOTH 2 % EX PADS: 6.0000 | MEDICATED_PAD | Freq: Every day | CUTANEOUS | Status: DC

## 2022-02-22 NOTE — TOC Initial Note (Signed)
Transition of Care [facility]) - Initial/Assessment Note    Patient Details  Name: Kaitlyn Hull MRN: [id number] Date of Birth: 1955/11/14  Transition [facility]) CM/SW Contact:    Bethena Roys, RN Phone Number: 02/22/2022, 11:20 AM  Clinical Narrative: Risk for readmission assessment completed. Patient presented for generalized weakness. PTA patient was from home with significant other. Case Manager will continue to follow for transition of care needs as the patient progresses.                    Planned Disposition: To be determined Barriers to Discharge: No Barriers Identified   Patient Goals and CMS Choice Patient states their goals for this hospitalization and ongoing recovery are:: to return home.  Expected Discharge Plan and Services Planned Disposition: To be determined In-house Referral: NA Discharge Planning Services: CM Consult Post Acute Care Choice: NA Living arrangements for the past 2 months: [place]                   DME Agency: NA    Prior Living Arrangements/Services Living arrangements for the past 2 months: [place] Lives with:: Significant Other Patient language and need for interpreter reviewed:: Yes Do you feel safe going back to the place where you live?: Yes      Need for Family Participation in Patient Care: Yes (Comment) Care giver support system in place?: Yes (comment)   Criminal Activity/Legal Involvement Pertinent to Current Situation/Hospitalization: No - Comment as needed  Activities of Daily Living Home Assistive Devices/Equipment: Walker (specify type) ADL Screening (condition at time of admission) Patient's cognitive ability adequate to safely complete daily activities?: Yes Is the patient deaf or have difficulty hearing?: No Does the patient have difficulty seeing, even when wearing glasses/contacts?: No Does the patient have difficulty concentrating, remembering, or making decisions?: No Patient  able to express need for assistance with ADLs?: No Does the patient have difficulty dressing or bathing?: No Independently performs ADLs?: Yes (appropriate for developmental age) Does the patient have difficulty walking or climbing stairs?: No Weakness of Legs: None Weakness of Arms/Hands: None  Permission Sought/Granted Permission sought to share information with : Family Supports, Case Manager   Emotional Assessment Appearance:: Appears stated age Attitude/Demeanor/Rapport: Engaged Affect (typically observed): Appropriate Orientation: : Oriented to Self, Oriented to Place, Oriented to  Time, Oriented to Situation Alcohol / Substance Use: Not Applicable Psych Involvement: No (comment)  Admission diagnosis:  Thrombocytopenia ([place]) [D69.6] Chemotherapy-induced neutropenia (HCC) [D70.1, T45.1X5A] Atrial fibrillation with rapid ventricular response (HCC) [I48.91] Atrial fibrillation with tachycardic ventricular rate ([place]) [I48.91] Patient Active Problem List   Diagnosis Date Noted   Pulmonary nodules 02/20/2022   Parotitis 02/19/2022   Sepsis ([place]) 02/18/2022   MSSA bacteremia 02/18/2022   Acute myeloid leukemia in adult [facility]) 02/04/2022   Atrial fibrillation with tachycardic ventricular rate ([place]) 02/14/2022   Neutropenic fever ([place]) 02/18/2022   Neutropenia ([place]) 12/07/2021   Hypomagnesemia 11/30/2021   Pancytopenia due to AML 10/30/2021   Acute myeloid leukemia not having achieved remission ([place])    Acute Myeloid Leukemia/Abnormal blood smear/ 10/27/2021   Acute anemia 10/26/2021   Thrombocytopenia (HCC)    Umbilical bleeding    Rupture of operation wound    Left breast mass    Wound of left breast 08/25/2020   Fecal occult blood test positive 12/17/2017   Screening for colorectal cancer 12/17/2017   Encounter for gynecological examination with Papanicolaou smear of cervix 12/17/2017   Esophageal  reflux    Pain in the chest 10/11/2015   Urinary frequency 01/20/2014    Hematuria 01/20/2014   Hyperthyroidism 06/29/2013   Chronic atrial fibrillation ([place]) 06/26/2013   Postmenopausal bleeding 08/20/2012   History of breast cancer 07/24/2012   Diabetes ([place]) 07/24/2012   Hypertension 07/24/2012   Morbid obesity due to excess calories ([place]) 07/24/2012   PMB (postmenopausal bleeding) 07/24/2012   PCP:  [place], [place]:   [place], [place] - [place] [address] [place] [address] [place] [address] Phone: [phone] Fax: [phone]   Social Determinants of Health ([place]) Social History: SDOH Screenings   Food Insecurity: No Food Insecurity (12/10/2021)  Housing: Low Risk  (12/10/2021)  Transportation Needs: No Transportation Needs (12/10/2021)  Utilities: Not At Risk (12/10/2021)  Tobacco Use: Medium Risk (02/05/2022)   Readmission Risk Interventions    02/22/2022   11:18 AM 12/08/2021   12:36 PM  Readmission Risk Prevention Plan  Transportation Screening Complete Complete  Home Care Screening  Complete  Medication Review (RN CM)  Complete  HRI or Home Care Consult Complete   Social Work Consult for [place] Planning/Counseling Complete   Palliative Care Screening Not Applicable   Medication Review Press photographer) Referral to Pharmacy

## 2022-02-22 NOTE — Progress Notes (Signed)
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  History of Present Illness:  66 year old female with past medical history as below, which is significant for AF, DM, HTN, and MAL currently on chemotherapy. She was admitted to [facility] 12/16 with a 2 day history of weakness, poor PO intake and falls. Workup discovered MSSA bacteremia in the setting of parotitis and multifocal pneumonia. 12/21 she became acutely short of breath accompanied by tachycardia. PCCM was consulted.    Pertinent  Medical History   has a past medical history of Atrial fibrillation with RVR ([place]) (06/2013), Breast cancer ([place]) (2007), Breast disorder, Diabetes mellitus, Dysrhythmia, Hematuria (01/20/2014), Hypertension, Hyperthyroidism (06/2013), Leukemia ([place]), Obesity, PMB (postmenopausal bleeding) (07/24/2012), Psoriasis, and Urinary frequency (01/20/2014).   Significant Hospital Events: Including procedures, antibiotic start and stop dates in addition to other pertinent events   12/16 admit 12/21 tx to ICU for BiPAP  Interim History / Subjective:    Objective   Blood pressure 131/79, pulse (!) 113, temperature 97.9 F (36.6 C), temperature source Axillary, resp. rate (!) 30, height '5\' 2"'$  (1.575 m), weight 112.8 kg, last menstrual period 11/17/2014, SpO2 92 %.        Intake/Output Summary (Last 24 hours) at 02/22/2022 1842 Last data filed at 02/22/2022 1505 Gross per 24 hour  Intake 2208.99 ml  Output 300 ml  Net 1908.99 ml    Filed Weights   02/05/2022 0842 02/06/2022 2356  Weight: 117 kg 112.8 kg    Examination: General: Obese middle aged female in moderate respiratory distress.  HENT: [place]/AT, unable to appreciate JVD Lungs: Very shallow breaths, difficult to gauge lung sounds.  Cardiovascular: IRIR, tachy, no  MRG Abdomen: Soft, NT, ND Extremities: 2+ lower extremity edema Neuro: Alert, oriented, non-focal  12/16 BC MSSA 12/18 BC neg 12/17 RVP negative  Resolved Hospital Problem list     Assessment & Plan:    Acute hypoxemic respiratory failure: etiology uncertain. Likely multifactorial. Hopeful this is pulmonary edema and we can correct with diuretics and BiPAP, but given her multiple co-morbidities I am concerned for worsening pneumonia or ARDS. Less likely VTE with platelets 13.  - transfer to ICU for close monitoring of respiratory status - Trial of BiPAP - At risk for intubation - 40 lasix given just prior to my evaluation.  - Keep Sat > 92%  MSSA bacteremia Neutropenic fever Multifocal pneumonia Left parotitis ? UTI ? IE, TTE poor quality.  - Continue cefazolin - ID following - Fungal workup pending  Permanent AF with RVR - Cardiology following - Amiodarone infusion continue - On digoxin - Hopefully some ventilatory support will help with this - AC on hold in the setting of thrombocytopenia  Acute on chronic HFpEF - Management per cardiology - Diuresing   AML Pancytopenia - Receiving venetoclax/decitabine (last cycle started 11/27).  Currently holding venetoclax. - S/p 7 units platelets (12/16, 12/17, 12/18x2, 12/19x2, 12/21) - S/p 3u PRBCs (12/17x2, 12/20) - Trend CBC - palliative care has been consulted  DM - CBG monitoring and SSI  Best Practice (right click and "Reselect all SmartList Selections" daily)   Diet/type: NPO DVT prophylaxis: not indicated GI prophylaxis: PPI Lines: N/A Foley:  N/A Code Status:  full code Last date of multidisciplinary goals of care discussion [12/21 Full code]  Labs   CBC: Recent Labs  Lab 02/22/2022 0845 02/18/22  0430 02/18/22 1035 02/19/22 0347 02/20/22 0211 02/21/22 0537 02/21/22 1950 02/22/22 0234  WBC 0.1* DUPL   < > <0.1* <0.1* <0.1* 0.1* 0.2*  NEUTROABS 0.0* PENDING  --   --   --   --   --  0.0*  HGB  7.9* DUPL   < > 7.9* 7.6* 7.2* 10.0* 8.8*  HCT 23.0* DUPL   < > 23.3* 21.4* 19.8* 28.0* 23.6*  MCV 88.8 DUPL   < > 90.3 87.0 85.7 85.4 84.0  PLT 7* DUPL   < > 12* 17* 26* 20* 13*   < > = values in this interval not displayed.     Basic Metabolic Panel: Recent Labs  Lab 02/12/2022 0902 02/18/22 0645 02/18/22 0646 02/18/22 1035 02/19/22 0347 02/20/22 0211 02/21/22 1020 02/22/22 0234  NA  --  136  --   --  130* 134* 137 135  K  --  5.7*  --  4.1 3.2* 3.9 2.8* 3.6  CL  --  108  --   --  101 106 109 106  CO2  --  16*  --   --  20* 17* 19* 20*  GLUCOSE  --  172*  --   --  108* 116* 157* 103*  BUN  --  24*  --   --  '21 19 17 19  '$ CREATININE  --  1.36*  --   --  1.30* 1.20* 1.02* 1.15*  CALCIUM  --  8.3*  --   --  7.6* 7.9* 7.5* 8.1*  MG 0.7*  --  1.7  --  1.6* 2.0  --  1.8  PHOS  --   --   --   --  3.1 2.3*  --   --     GFR: Estimated Creatinine Clearance: 57.9 mL/min (A) (by C-G formula based on SCr of 1.15 mg/dL (H)). Recent Labs  Lab 02/19/2022 0846 02/02/2022 1040 02/18/22 0430 02/20/22 0211 02/21/22 0537 02/21/22 1950 02/22/22 0234  WBC  --   --    < > <0.1* <0.1* 0.1* 0.2*  LATICACIDVEN 2.4* 2.0*  --   --   --   --   --    < > = values in this interval not displayed.     Liver Function Tests: Recent Labs  Lab 02/11/2022 0845 02/19/22 0347 02/20/22 0211  AST 34 31 24  ALT '19 23 13  '$ ALKPHOS 68 40 49  BILITOT 3.1* 1.8* 2.2*  PROT 7.3 5.4* 5.2*  ALBUMIN 3.3* 2.1* 1.9*    No results for input(s): "LIPASE", "AMYLASE" in the last 168 hours. No results for input(s): "AMMONIA" in the last 168 hours.  ABG No results found for: "PHART", "PCO2ART", "PO2ART", "HCO3", "TCO2", "ACIDBASEDEF", "O2SAT"   Coagulation Profile: No results for input(s): "INR", "PROTIME" in the last 168 hours.  Cardiac Enzymes: No results for input(s): "CKTOTAL", "CKMB", "CKMBINDEX", "TROPONINI" in the last 168 hours.  HbA1C: Hgb A1c MFr Bld  Date/Time Value Ref Range Status  02/18/2022  10:35 AM 5.6 4.8 - 5.6 % Final    Comment:    (NOTE)         Prediabetes: 5.7 - 6.4         Diabetes: >6.4         Glycemic control for adults with diabetes: <7.0   10/26/2021 07:45 PM 6.2 (H) 4.8 - 5.6 % Final    Comment:    (NOTE)         Prediabetes: 5.7 - 6.4  Diabetes: >6.4         Glycemic control for adults with diabetes: <7.0     CBG: Recent Labs  Lab 02/21/22 1633 02/21/22 2105 02/22/22 0800 02/22/22 1126 02/22/22 1607  GLUCAP 97 97 118* 122* 106*     Review of Systems:   10 point review of system taken, please see HPI for positives and negatives.   Past Medical History:  She,  has a past medical history of Atrial fibrillation with RVR ([place]) (06/2013), Breast cancer ([place]) (2007), Breast disorder, Diabetes mellitus, Dysrhythmia, Hematuria (01/20/2014), Hypertension, Hyperthyroidism (06/2013), Leukemia ([place]), Obesity, PMB (postmenopausal bleeding) (07/24/2012), Psoriasis, and Urinary frequency (01/20/2014).   Surgical History:   Past Surgical History:  Procedure Laterality Date   BREAST BIOPSY Left 09/14/2020   Procedure: BREAST BIOPSY;  Surgeon: Aviva Signs, MD;  Location: [facility] ORS;  Service: General;  Laterality: Left;   BREAST SURGERY  02/20/06   left side    CESAREAN SECTION  1995   COLONOSCOPY WITH PROPOFOL N/A 10/28/2021   Procedure: COLONOSCOPY WITH PROPOFOL;  Surgeon: Daneil Dolin, MD;  Location: [facility] ENDO SUITE;  Service: Endoscopy;  Laterality: N/A;   ESOPHAGOGASTRODUODENOSCOPY (EGD) WITH PROPOFOL N/A 10/28/2021   Procedure: ESOPHAGOGASTRODUODENOSCOPY (EGD) WITH PROPOFOL;  Surgeon: Daneil Dolin, MD;  Location: [facility] ENDO SUITE;  Service: Endoscopy;  Laterality: N/A;   HYSTEROSCOPY WITH D & C N/A 08/13/2012   Procedure: DILATATION AND CURETTAGE /HYSTEROSCOPY;  Surgeon: Florian Buff, MD;  Location: [facility] ORS;  Service: Gynecology;  Laterality: N/A;   SECONDARY CLOSURE OF WOUND Left 02/20/2021   Procedure: SIMPLE WOUND CLOSURE;  Surgeon: Aviva Signs, MD;  Location: [facility] ORS;  Service: General;  Laterality: Left;     Social History:   reports that she quit smoking about 18 years ago. Her smoking use included cigarettes. She started smoking about 46 years ago. She has a 0.01 pack-year smoking history. She has never used smokeless tobacco. She reports that she does not drink alcohol and does not use drugs.   Family History:  Her family history includes Alzheimer's disease in her mother; Breast cancer in her sister; CAD in her father; Diabetes in her sister and sister; Heart failure in her father; Hypertension in her maternal grandmother; Other in her sister. There is no history of Colon cancer.   Allergies No Known Allergies   Home Medications  Prior to Admission medications   Medication Sig Start Date End Date Taking? Authorizing Provider  acetaminophen (TYLENOL) 500 MG tablet Take 500 mg by mouth every 6 (six) hours as needed. Taken as needed for headache   Yes [provider]  ALPRAZolam (XANAX) 0.5 MG tablet Take 1 tablet by mouth 2 (two) times daily as needed for anxiety. 10/30/21 10/30/22 Yes [provider]  apixaban (ELIQUIS) 5 MG TABS tablet Take 2.5 mg by mouth 2 (two) times daily. 1/2 tablet BID   Yes [provider]  furosemide (LASIX) 20 MG tablet Take 1 tablet (20 mg total) by mouth daily as needed. Patient taking differently: Take 20 mg by mouth daily as needed for fluid. 11/30/21  Yes Derek Jack, MD  LORazepam (ATIVAN) 0.5 MG tablet Take 1 tablet (0.5 mg total) by mouth 2 (two) times daily as needed for anxiety. 12/19/21  Yes Derek Jack, MD  magnesium oxide (MAG-OX) 400 (240 Mg) MG tablet Take 1 tablet by mouth 2 (two) times daily. 12/26/21  Yes [provider]  metFORMIN (GLUCOPHAGE-XR) 500 MG 24 hr tablet Take 500-1,000 mg by  mouth See admin instructions. Take 500 mg by mouth in the morning and 1000 mg at bedtime 03/26/18  Yes [provider]  metoprolol  tartrate (LOPRESSOR) 100 MG tablet Take 1 tablet (100 mg total) by mouth 2 (two) times daily. 11/23/21 11/18/22 Yes BranchAlphonse Guild, MD  pravastatin (PRAVACHOL) 40 MG tablet Take 40 mg by mouth at bedtime.   Yes [provider]  venetoclax (VENCLEXTA) 100 MG tablet Take 400 mg by mouth daily. 01/23/22  Yes [provider]     Critical care time: 43 minutes     Georgann Housekeeper, AGACNP-BC [facility]  See Amion for personal pager PCCM on call pager [phone] until 7pm. Please call Elink 7p-7a. [phone]  02/22/2022 7:15 PM

## 2022-02-22 NOTE — Progress Notes (Signed)
Dear Doctor: This patient has been identified as a candidate for PICC/CVCfor the following reason (s): poor veins/poor circulatory system (CHF, COPD, emphysema, diabetes, steroid use, IV drug abuse, etc.) and restarts due to phlebitis and infiltration in 24 hours If you agree, please write an order for the indicated device.  Thank you for supporting the early vascular access assessment program.

## 2022-02-22 NOTE — Progress Notes (Signed)
Pt transported from 6E01 to 2M02 on bipap with RRT, SWOT RN, and RT. Transfer uneventful.

## 2022-02-22 NOTE — Progress Notes (Signed)
[facility] PROGRESS NOTE  Kaitlyn Hull (DOB: 08-04-1955) VVO:[id number] PCP: Ledell Noss, [facility]  Brief Narrative: ETHELWYN GILBERTSON is a 66 year old female with a history of chronic atrial fibrillation, diabetes mellitus type 2, hypertension, AML morbid obesity presenting with 2-day history of generalized weakness, decreased oral intake, and falling.  She has been found to have MSSA bacteremia in setting of parotitis and multifocal pneumonia with febrile neutropenia and pancytopenia.     See below for additional details   Subjective: Back pain is improved today compared to yesterday. No fevers. No bleeding. Has been gradually getting more short of breath overnight, placed on oxygen today.   Objective: BP 105/84 (BP Location: Right Wrist)   Pulse (!) 115   Temp (!) 97.5 F (36.4 C) (Oral)   Resp 20   Ht _0  (1.575 m)   Wt 112.8 kg   LMP 11/17/2014   SpO2 96%   BMI 45.47 kg/m   Gen: Tired appearing obese female in no acute distress Pulm: Tachypneic with crackles at bases, no wheezing. No accessory muscle use.  CV: Irreg tachycardia, no MRG.  GI: Soft, NT, ND, +BS  Neuro: Alert and oriented. No new focal deficits. Ext: Warm, no deformities Skin: Left jawline induration stable, erythema decreased. No new rashes, lesions or ulcers on visualized skin   Assessment & Plan: Sepsis due to MSSA bacteremia: Pansensitive based on 12/16 Cx. Left Sided Parotitis and Cellulitis of Left Sided of Face Multifocal Pneumonia Febrile Neutropenia Sepsis ruled in with fever, neutropenia, tachycardia, tachypnea Workup thus far revealing for L neck skin soft tissue infection, no clear fluctuance on exam, lower suspicion for abscess - Blood Cx 12/18 NGTD. No vegetation on TTE. TEE contraindicated with degree of thrombocytopenia.  - PICC line removed 12/17 - UA with nitrite, many bacteria, urine culture was sent. - Recommend follow up imaging after treatment to ensure resolution of CT  opacities.  - ID consulted > continue ancef x6 weeks - PCCM consulted, deferring bronch/BAL for now.  - Follow up endemic fungi, aspergillus serology. Negative crypto Ag.    Acute myeloid leukemia, pancytopenia, symptomatic anemia: [facility]managed at [facility] by Dr. Jerrye Noble and [facility] with Dr. Delton Coombes.  - Receiving venetoclax/decitabine (last cycle started 11/27).  Currently holding venetoclax. - S/p 7 units platelets (12/16, 12/17, 12/18x2, 12/19x2, 12/21) - S/p 3u PRBCs (12/17x2, 12/20) - WBC making slight upward movement. - Continue monitoring. Hgb improved as anticipated with transfusion, no active bleeding. Ordered additional platelet transfusion today per hematology/oncology recommendations level < 20.  - Transfuse leukocyte-reduced and irradiated blood products for Hb <8 and platelets <20  - Palliative care consulted for continued [place] discussions.   Lumbar back pain: - Robaxin prn. No radicular symptoms or midline tenderness/deformity/drop off. At risk of seeding, will check L spine MRI.  Acute hypoxic respiratory failure: Suspect pulmonary edema from IVF and ongoing AFib with RVR.  - Cautious diuresis and rate control as discussed below. - Continue supplemental oxygen to maintain normal respiratory effort and SpO2 >89%.  - Abx as above regardless.  - PE considered in differential though less likely and wouldn't be able to anticoagulate regardless.  Permanent atrial fibrillation with RVR - Digoxin loaded, level 1.0, started and will repeat level in couple days.  - Continue amiodarone gtt. - Still complicated by hypotension.   AKI: Due to sepsis, improved with Tx and IVF. IVF stopped.  - Will monitor renal function with attempts at diuresis.    Hypokalemia:  - Improved with  supplementation. With ongoing AFib and lasix today, ordered 45mq.  - Continue standing Mg supp as well.    T2DM: HbA1c 6.2%.  - Hold metformin - SSI    8 mm nodule in superior aspect of L breast -  Recommend outpatient mammogram and [place]  Obesity: Body mass index is 45.47 kg/m.   RPatrecia Pour MD [facility] [email or web address] 02/22/2022, 12:07 PM

## 2022-02-22 NOTE — Progress Notes (Signed)
[facility]ious Disease  Date of Admission:  02/11/2022       Abx: 12/17-c Cefazolin   12/16-17 cefepime/vanc  ASSESSMENT: 66 yo female aml on chemo (last dose decitabine 11/2021), chronic pancytopenia including severe neutropenia, admitted with mssa bacteremia with severe sepsis  She had a picc placed 11/2021 for chemo that was removed this admission  12/16 bcx mssa 12/18 bcx negative 12/17 respiratory viral pcr negative  12/16 "fluid cx" I am not clear what this is from and this was negative  Picc remove 12/17  12/18 admission neck ct that showed left parotitis and b.  12/19 chest ct bilateral multiple pulm nodular opacities. No sign/sx of pna. Concerning for fungal process although improved fever without treatment of such  Tte poor quality. Tee not able to be done due to severe thrombocytopenia  Mssa bsi from line vs parotitis or from parotitis then seeding the line? She has non obvious metastatic bone/joint complication of mssa bacteremia. She does have some redness around the left neck ?from parotitis complication -- no obvious abscess   -------------------- 12/21 assessment Left cellulitis/parotitis had improved -- nontender there  Afebrile New back pain since 12/20 lumbar area concerning for mssa involvement   PLAN: F/u Endemic fungi, aspergillus serology Mri l-spine Continue cefazolin plan to in setting of inability to obtain tee, treat her for 6 weeks with cefazolin, or longer, depending on back finding on mri Discussed with primary team    I spent more than 35 minute reviewing data/chart, and coordinating care and >50% direct face to face time providing counseling/discussing diagnostics/treatment plan with patient   Principal Problem:   Atrial fibrillation with tachycardic ventricular rate ([place]) Active Problems:   Morbid obesity due to excess calories (HCC)   Chronic atrial fibrillation (HCC)   Acute Myeloid  Leukemia/Abnormal blood smear/   Neutropenia (HCC)   Acute myeloid leukemia in adult [facility])   Neutropenic fever (HCC)   Sepsis (HCC)   MSSA bacteremia   Parotitis   Pulmonary nodules   No Known Allergies  Scheduled Meds:  acetaminophen  1,000 mg Oral Q8H   digoxin  0.125 mg Oral Daily   feeding supplement (GLUCERNA SHAKE)  237 mL Oral BID BM   insulin aspart  0-15 Units Subcutaneous TID WC   magnesium oxide  400 mg Oral BID   multivitamin with minerals  1 tablet Oral Daily   potassium chloride  20 mEq Oral BID   pravastatin  40 mg Oral q1800   Continuous Infusions:  amiodarone 30 mg/hr (02/22/22 1211)    ceFAZolin (ANCEF) IV 2 g (02/22/22 0544)   methocarbamol (ROBAXIN) IV 500 mg (02/22/22 0731)   PRN Meds:.acetaminophen **FOLLOWED BY** acetaminophen, albuterol, ALPRAZolam, methocarbamol (ROBAXIN) IV, metoprolol tartrate, ondansetron **OR** ondansetron (ZOFRAN) IV, oxyCODONE   SUBJECTIVE: Afebrile; no n/v/diarrhea/headache No chest pain/cough New lower back pain last 48 hours very bad No LE neurologic deficit  Review of Systems: ROS All other ROS was negative, except mentioned above     OBJECTIVE: Vitals:   02/22/22 0822 02/22/22 1030 02/22/22 1045 02/22/22 1258  BP: 98/76 102/70 105/84 124/75  Pulse: (!) 108 (!) 118 (!) 115 72  Resp: '18 18 20 18  '$ Temp: 98 F (36.7 C) 97.7 F (36.5 C) (!) 97.5 F (36.4 C) 97.8 F (36.6 C)  TempSrc: Oral Oral Oral Axillary  SpO2: 96% 95% 96% 92%  Weight:      Height:       Body  mass index is 45.47 kg/m.  Physical Exam  General/constitutional: complaining of back pain, no distress otherwise, conversant HEENT: Normocephalic, PER, Conj Clear CV: rrr no mrg Lungs: clear/normal respiratory effort Abd: Soft, Nontender Ext: no edema Skin: scattered echymosis -- redness left face/neck  Neuro: nonfocal, generalized weakness MSK: midline and bilateral paralumbar spine tenderness    Lab Results Lab Results  Component  Value Date   WBC 0.2 (LL) 02/22/2022   HGB 8.8 (L) 02/22/2022   HCT 23.6 (L) 02/22/2022   MCV 84.0 02/22/2022   PLT 13 (LL) 02/22/2022    Lab Results  Component Value Date   CREATININE 1.15 (H) 02/22/2022   BUN 19 02/22/2022   NA 135 02/22/2022   K 3.6 02/22/2022   CL 106 02/22/2022   CO2 20 (L) 02/22/2022    Lab Results  Component Value Date   ALT 13 02/20/2022   AST 24 02/20/2022   ALKPHOS 49 02/20/2022   BILITOT 2.2 (H) 02/20/2022      Microbiology: Recent Results (from the past 240 hour(s))  Resp panel by RT-PCR (RSV, Flu A&B, Covid) Anterior Nasal Swab     Status: None   Collection Time: 03/03/2022  8:59 AM   Specimen: Anterior Nasal Swab  Result Value Ref Range Status   SARS Coronavirus 2 by RT PCR NEGATIVE NEGATIVE Final    Comment: (NOTE) SARS-CoV-2 target nucleic acids are NOT DETECTED.  The SARS-CoV-2 RNA is generally detectable in upper respiratory specimens during the acute phase of infection. The lowest concentration of SARS-CoV-2 viral copies this assay can detect is 138 copies/mL. A negative result does not preclude SARS-Cov-2 infection and should not be used as the sole basis for treatment or other patient management decisions. A negative result may occur with  improper specimen collection/handling, submission of specimen other than nasopharyngeal swab, presence of viral mutation(s) within the areas targeted by this assay, and inadequate number of viral copies(<138 copies/mL). A negative result must be combined with clinical observations, patient history, and epidemiological information. The expected result is Negative.  Fact Sheet for Patients:  EntrepreneurPulse.com.au  Fact Sheet for Healthcare Providers:  IncredibleEmployment.be  This test is no t yet approved or cleared by the [place] FDA and  has been authorized for detection and/or diagnosis of SARS-CoV-2 by FDA under an Emergency Use Authorization  (EUA). This EUA will remain  in effect (meaning this test can be used) for the duration of the COVID-19 declaration under Section 564(b)(1) of the Act, 21 U.S.C.section 360bbb-3(b)(1), unless the authorization is terminated  or revoked sooner.       Influenza A by PCR NEGATIVE NEGATIVE Final   Influenza B by PCR NEGATIVE NEGATIVE Final    Comment: (NOTE) The Xpert Xpress SARS-CoV-2/FLU/RSV plus assay is intended as an aid in the diagnosis of influenza from Nasopharyngeal swab specimens and should not be used as a sole basis for treatment. Nasal washings and aspirates are unacceptable for Xpert Xpress SARS-CoV-2/FLU/RSV testing.  Fact Sheet for Patients: EntrepreneurPulse.com.au  Fact Sheet for Healthcare Providers: IncredibleEmployment.be  This test is not yet approved or cleared by the [place] FDA and has been authorized for detection and/or diagnosis of SARS-CoV-2 by FDA under an Emergency Use Authorization (EUA). This EUA will remain in effect (meaning this test can be used) for the duration of the COVID-19 declaration under Section 564(b)(1) of the Act, 21 U.S.C. section 360bbb-3(b)(1), unless the authorization is terminated or revoked.     Resp Syncytial Virus by PCR NEGATIVE NEGATIVE  Final    Comment: (NOTE) Fact Sheet for Patients: EntrepreneurPulse.com.au  Fact Sheet for Healthcare Providers: IncredibleEmployment.be  This test is not yet approved or cleared by the [place] FDA and has been authorized for detection and/or diagnosis of SARS-CoV-2 by FDA under an Emergency Use Authorization (EUA). This EUA will remain in effect (meaning this test can be used) for the duration of the COVID-19 declaration under Section 564(b)(1) of the Act, 21 U.S.C. section 360bbb-3(b)(1), unless the authorization is terminated or revoked.  Performed at [facility], [address], [place], [place]  [address]   Blood culture (routine x 2)     Status: Abnormal   Collection Time: 02/10/2022  3:19 PM   Specimen: BLOOD  Result Value Ref Range Status   Specimen Description BLOOD SITE NOT SPECIFIED  Final   Special Requests   Final    BOTTLES DRAWN AEROBIC AND ANAEROBIC Blood Culture results may not be optimal due to an inadequate volume of blood received in culture bottles   Culture  Setup Time   Final    GRAM POSITIVE COCCI IN BOTH AEROBIC AND ANAEROBIC BOTTLES CRITICAL VALUE NOTED.  VALUE IS CONSISTENT WITH PREVIOUSLY REPORTED AND CALLED VALUE.    Culture (A)  Final    STAPHYLOCOCCUS AUREUS SUSCEPTIBILITIES PERFORMED ON PREVIOUS CULTURE WITHIN THE LAST 5 DAYS. Performed at [facility], [place] [address] [place], [place] [address]    Report Status 02/20/2022 FINAL  Final  Blood culture (routine x 2)     Status: Abnormal   Collection Time: 02/04/2022  3:24 PM   Specimen: BLOOD  Result Value Ref Range Status   Specimen Description BLOOD SITE NOT SPECIFIED  Final   Special Requests   Final    BOTTLES DRAWN AEROBIC AND ANAEROBIC Blood Culture adequate volume   Culture  Setup Time   Final    GRAM POSITIVE COCCI IN PAIRS IN CLUSTERS IN BOTH AEROBIC AND ANAEROBIC BOTTLES CRITICAL RESULT CALLED TO, READ BACK BY AND VERIFIED WITH: PHARMD [email or web address] AT 1829 ON 02/18/2022 BY T.SAAD. Performed at [facility], [place] [address] [place], [place] [address]    Culture STAPHYLOCOCCUS AUREUS (A)  Final   Report Status 02/20/2022 FINAL  Final   Organism ID, Bacteria STAPHYLOCOCCUS AUREUS  Final      Susceptibility   Staphylococcus aureus - MIC*    CIPROFLOXACIN <=0.5 SENSITIVE Sensitive     ERYTHROMYCIN <=0.25 SENSITIVE Sensitive     GENTAMICIN <=0.5 SENSITIVE Sensitive     OXACILLIN <=0.25 SENSITIVE Sensitive     TETRACYCLINE <=1 SENSITIVE Sensitive     VANCOMYCIN 1 SENSITIVE Sensitive     TRIMETH/SULFA <=10 SENSITIVE Sensitive     CLINDAMYCIN <=0.25 SENSITIVE Sensitive     RIFAMPIN <=0.5  SENSITIVE Sensitive     Inducible Clindamycin NEGATIVE Sensitive     * STAPHYLOCOCCUS AUREUS  Blood Culture ID Panel (Reflexed)     Status: Abnormal   Collection Time: 02/06/2022  3:24 PM  Result Value Ref Range Status   Enterococcus faecalis NOT DETECTED NOT DETECTED Final   Enterococcus Faecium NOT DETECTED NOT DETECTED Final   Listeria monocytogenes NOT DETECTED NOT DETECTED Final   Staphylococcus species DETECTED (A) NOT DETECTED Final    Comment: CRITICAL RESULT CALLED TO, READ BACK BY AND VERIFIED WITH: PHARMD [email or web address] AT 9678 ON 02/18/2022 BY T.SAAD.    Staphylococcus aureus (BCID) DETECTED (A) NOT DETECTED Final    Comment: CRITICAL RESULT CALLED TO, READ BACK BY AND VERIFIED WITH:  PHARMD [email or web address] AT 6270 ON 02/18/2022 BY T.SAAD.    Staphylococcus epidermidis NOT DETECTED NOT DETECTED Final   Staphylococcus lugdunensis NOT DETECTED NOT DETECTED Final   Streptococcus species NOT DETECTED NOT DETECTED Final   Streptococcus agalactiae NOT DETECTED NOT DETECTED Final   Streptococcus pneumoniae NOT DETECTED NOT DETECTED Final   Streptococcus pyogenes NOT DETECTED NOT DETECTED Final   A.calcoaceticus-baumannii NOT DETECTED NOT DETECTED Final   Bacteroides fragilis NOT DETECTED NOT DETECTED Final   Enterobacterales NOT DETECTED NOT DETECTED Final   Enterobacter cloacae complex NOT DETECTED NOT DETECTED Final   Escherichia coli NOT DETECTED NOT DETECTED Final   Klebsiella aerogenes NOT DETECTED NOT DETECTED Final   Klebsiella oxytoca NOT DETECTED NOT DETECTED Final   Klebsiella pneumoniae NOT DETECTED NOT DETECTED Final   Proteus species NOT DETECTED NOT DETECTED Final   Salmonella species NOT DETECTED NOT DETECTED Final   Serratia marcescens NOT DETECTED NOT DETECTED Final   Haemophilus influenzae NOT DETECTED NOT DETECTED Final   Neisseria meningitidis NOT DETECTED NOT DETECTED Final   Pseudomonas aeruginosa NOT DETECTED NOT DETECTED Final   Stenotrophomonas maltophilia NOT  DETECTED NOT DETECTED Final   Candida albicans NOT DETECTED NOT DETECTED Final   Candida auris NOT DETECTED NOT DETECTED Final   Candida glabrata NOT DETECTED NOT DETECTED Final   Candida krusei NOT DETECTED NOT DETECTED Final   Candida parapsilosis NOT DETECTED NOT DETECTED Final   Candida tropicalis NOT DETECTED NOT DETECTED Final   Cryptococcus neoformans/gattii NOT DETECTED NOT DETECTED Final   Meth resistant mecA/C and MREJ NOT DETECTED NOT DETECTED Final    Comment: Performed at [facility], [address], [place], [place] [address]  Culture, body fluid w Gram Stain-bottle     Status: None   Collection Time: 03/01/2022  6:25 PM   Specimen: Fluid  Result Value Ref Range Status   Specimen Description FLUID  Final   Special Requests   Final    BOTTLES DRAWN AEROBIC AND ANAEROBIC Blood Culture results may not be optimal due to an excessive volume of blood received in culture bottles   Culture   Final    NO GROWTH 5 DAYS Performed at [facility], [place] [address], [place], [place] [address]    Report Status 02/22/2022 FINAL  Final  Gram stain     Status: None   Collection Time: 02/26/2022  6:26 PM   Specimen: Fluid  Result Value Ref Range Status   Specimen Description FLUID  Final   Special Requests NONE  Final   Gram Stain   Final    NO ORGANISMS SEEN NO WBC SEEN Performed at [facility], [place] [address], [place],  [address]    Report Status 02/08/2022 FINAL  Final  Respiratory (~20 pathogens) panel by PCR     Status: None   Collection Time: 02/18/22 10:58 AM   Specimen: Nasopharyngeal Swab; Respiratory  Result Value Ref Range Status   Adenovirus NOT DETECTED NOT DETECTED Final   Coronavirus 229E NOT DETECTED NOT DETECTED Final    Comment: (NOTE) The Coronavirus on the Respiratory Panel, DOES NOT test for the novel  Coronavirus (2019 nCoV)    Coronavirus HKU1 NOT DETECTED NOT DETECTED Final   Coronavirus NL63 NOT DETECTED NOT DETECTED Final    Coronavirus OC43 NOT DETECTED NOT DETECTED Final   Metapneumovirus NOT DETECTED NOT DETECTED Final   Rhinovirus / Enterovirus NOT DETECTED NOT DETECTED Final   Influenza A NOT DETECTED NOT DETECTED  Final   Influenza B NOT DETECTED NOT DETECTED Final   Parainfluenza Virus 1 NOT DETECTED NOT DETECTED Final   Parainfluenza Virus 2 NOT DETECTED NOT DETECTED Final   Parainfluenza Virus 3 NOT DETECTED NOT DETECTED Final   Parainfluenza Virus 4 NOT DETECTED NOT DETECTED Final   Respiratory Syncytial Virus NOT DETECTED NOT DETECTED Final   Bordetella pertussis NOT DETECTED NOT DETECTED Final   Bordetella Parapertussis NOT DETECTED NOT DETECTED Final   Chlamydophila pneumoniae NOT DETECTED NOT DETECTED Final   Mycoplasma pneumoniae NOT DETECTED NOT DETECTED Final    Comment: Performed at [facility], [place] [address], [place], [place] [address]  Culture, blood (Routine X 2) w Reflex to ID Panel     Status: None (Preliminary result)   Collection Time: 02/19/22  5:54 AM   Specimen: BLOOD  Result Value Ref Range Status   Specimen Description BLOOD BLOOD LEFT ARM  Final   Special Requests   Final    BOTTLES DRAWN AEROBIC ONLY Blood Culture adequate volume   Culture   Final    NO GROWTH 3 DAYS Performed at [facility], [place] [address], [place], [place] [address]    Report Status PENDING  Incomplete  Culture, blood (Routine X 2) w Reflex to ID Panel     Status: None (Preliminary result)   Collection Time: 02/19/22  5:54 AM   Specimen: BLOOD  Result Value Ref Range Status   Specimen Description BLOOD BLOOD LEFT HAND  Final   Special Requests   Final    BOTTLES DRAWN AEROBIC ONLY Blood Culture adequate volume   Culture   Final    NO GROWTH 3 DAYS Performed at [facility], [place] [address], [place], [place] [address]    Report Status PENDING  Incomplete     Serology:   Imaging: If present, new imagings (plain films, ct scans, and mri) have been personally visualized and  interpreted; radiology reports have been reviewed. Decision making incorporated into the Impression / Recommendations.  12/17 tte Sonographer Comments: Image acquisition challenging due to patient body habitus and Image acquisition challenging due to respiratory motion.  IMPRESSIONS    1. Left ventricular ejection fraction, by estimation, is 50 to 55%. The left ventricle has low normal function. The left ventricle has no regional wall motion abnormalities. Left ventricular diastolic parameters are indeterminate.   2. Right ventricular systolic function is normal. The right ventricular size is normal. There is normal pulmonary artery systolic pressure.   3. The mitral valve was not well visualized. Moderate mitral valve regurgitation.   4. The aortic valve is tricuspid. There is mild calcification of the  aortic valve. There is mild thickening of the aortic valve. Aortic valve regurgitation is not visualized.   5. The inferior vena cava is normal in size with greater than 50% respiratory variability, suggesting right atrial pressure of 3 mmHg.   Comparison(s): Prior images reviewed side by side. Increase in mitral regurgitation.    12/18 ct soft tissue neck IMPRESSION: 1. Asymmetric density and heterogeneity of the left parotid gland compatible with Acute Parotitis, or possibly parotid injury in the setting of fall. No complicating features.   2. Multifocal bilateral upper lobe pulmonary opacity is new from aSeptember CTA and appears infectious/inflammatory. Consider Bilateral Pneumonia including viral/atypical etiologies.    12/18 cxr Scattered areas of ill-defined patchy and nodular airspace disease bilaterally. Imaging features raise concern for multifocal pneumonia. Follow-up recommended to ensure resolution.  12/29 chest  ct 1. Multifocal infiltrate worrisome for pneumonia. Recommend follow-up imaging after treatment to ensure resolution. 2. 8 mm nodule in the superior aspect of the  left breast, not identified on the CT scan dated December 02, 2021. Recommend diagnostic mammography and ultrasound for further evaluation. 3. Calcified atherosclerotic change in the left coronary arteries. 4. Calcified atherosclerotic change in the thoracic aorta. 5. Cirrhotic morphology of the liver.    Jabier Mutton, [facility] [place] [phone] pager    02/22/2022, 1:14 PM

## 2022-02-22 NOTE — Progress Notes (Signed)
Rounding Note    Patient Name: Kaitlyn Hull Date of Encounter: 02/22/2022  [place] Cardiologist: Carlyle Dolly, MD   Subjective   Sitting in a chair. Feeling more SOB. States she feels unwell. No chest pain or palpitations  BNP 791 with worsening SOB after receiving IVF; given lasix '20mg'$  IV with only 300 of UOP recorded HR remain 100-140s    Inpatient Medications    Scheduled Meds:  sodium chloride   Intravenous Once   acetaminophen  1,000 mg Oral Q8H   digoxin  0.125 mg Oral Daily   feeding supplement (GLUCERNA SHAKE)  237 mL Oral BID BM   insulin aspart  0-15 Units Subcutaneous TID WC   magnesium oxide  400 mg Oral BID   multivitamin with minerals  1 tablet Oral Daily   potassium chloride  20 mEq Oral BID   pravastatin  40 mg Oral q1800   Continuous Infusions:  amiodarone 30 mg/hr (02/22/22 0120)    ceFAZolin (ANCEF) IV 2 g (02/22/22 0544)   methocarbamol (ROBAXIN) IV 500 mg (02/22/22 0731)   PRN Meds: acetaminophen **FOLLOWED BY** acetaminophen, albuterol, ALPRAZolam, methocarbamol (ROBAXIN) IV, metoprolol tartrate, ondansetron **OR** ondansetron (ZOFRAN) IV   Vital Signs    Vitals:   02/21/22 2106 02/22/22 0022 02/22/22 0330 02/22/22 0822  BP: 132/76 112/66 122/70 98/76  Pulse: (!) 108 (!) 108 (!) 109 (!) 108  Resp: '20 20 20 18  '$ Temp: 97.6 F (36.4 C) 97.6 F (36.4 C) (!) 97.4 F (36.3 C) 98 F (36.7 C)  TempSrc: Axillary Axillary Oral Oral  SpO2: 100% 97% 98% 96%  Weight:      Height:        Intake/Output Summary (Last 24 hours) at 02/22/2022 1031 Last data filed at 02/22/2022 0453 Gross per 24 hour  Intake 3017.75 ml  Output 300 ml  Net 2717.75 ml      02/27/2022   11:56 PM 02/20/2022    8:42 AM 02/05/2022    1:39 PM  Last 3 Weights  Weight (lbs) 248 lb 9.6 oz 258 lb 257 lb 12.8 oz  Weight (kg) 112.764 kg 117.028 kg 116.937 kg      Telemetry   Afib with HR 100-140s (mainly 100s overnight)- Personally Reviewed  ECG     No new tracing today- Personally Reviewed  Physical Exam   GEN: Sitting in a chair, mildly uncomfortable appearing Neck: JVD difficult to assess due to body habitus Cardiac: Irregularly irregular, no murmurs Respiratory: Crackles at the bases bilaterally GI: Obese, soft, NTTP MS: Trace-1+ edema, warm Neuro: AAOx3 Psych: Normal affect   Labs    High Sensitivity Troponin:  No results for input(s): "TROPONINIHS" in the last 720 hours.   Chemistry Recent Labs  Lab 02/06/2022 0845 02/16/2022 0902 02/19/22 0347 02/20/22 0211 02/21/22 1020 02/22/22 0234  NA 130*   < > 130* 134* 137 135  K 3.8   < > 3.2* 3.9 2.8* 3.6  CL 98   < > 101 106 109 106  CO2 21*   < > 20* 17* 19* 20*  GLUCOSE 223*   < > 108* 116* 157* 103*  BUN 27*   < > '21 19 17 19  '$ CREATININE 1.61*   < > 1.30* 1.20* 1.02* 1.15*  CALCIUM 8.9   < > 7.6* 7.9* 7.5* 8.1*  MG  --    < > 1.6* 2.0  --  1.8  PROT 7.3  --  5.4* 5.2*  --   --  ALBUMIN 3.3*  --  2.1* 1.9*  --   --   AST 34  --  31 24  --   --   ALT 19  --  23 13  --   --   ALKPHOS 68  --  40 49  --   --   BILITOT 3.1*  --  1.8* 2.2*  --   --   GFRNONAA 35*   < > 46* 50* >60 53*  ANIONGAP 11   < > '9 11 9 9   '$ < > = values in this interval not displayed.    Lipids No results for input(s): "CHOL", "TRIG", "HDL", "LABVLDL", "LDLCALC", "CHOLHDL" in the last 168 hours.  Hematology Recent Labs  Lab 02/21/22 0537 02/21/22 1950 02/22/22 0234  WBC <0.1* 0.1* 0.2*  RBC 2.31* 3.28* 2.81*  HGB 7.2* 10.0* 8.8*  HCT 19.8* 28.0* 23.6*  MCV 85.7 85.4 84.0  MCH 31.2 30.5 31.3  MCHC 36.4* 35.7 37.3*  RDW 18.8* 17.8* 18.1*  PLT 26* 20* 13*   Thyroid No results for input(s): "TSH", "FREET4" in the last 168 hours.  BNP Recent Labs  Lab 02/02/2022 0902 02/19/22 0347 02/21/22 1020  BNP 370.0* 499.1* 791.8*    DDimer No results for input(s): "DDIMER" in the last 168 hours.   Radiology    No results found.  Cardiac Studies  TTE 02/18/22: IMPRESSIONS      1. Left ventricular ejection fraction, by estimation, is 50 to 55%. The  left ventricle has low normal function. The left ventricle has no regional  wall motion abnormalities. Left ventricular diastolic parameters are  indeterminate.   2. Right ventricular systolic function is normal. The right ventricular  size is normal. There is normal pulmonary artery systolic pressure.   3. The mitral valve was not well visualized. Moderate mitral valve  regurgitation.   4. The aortic valve is tricuspid. There is mild calcification of the  aortic valve. There is mild thickening of the aortic valve. Aortic valve  regurgitation is not visualized.   5. The inferior vena cava is normal in size with greater than 50%  respiratory variability, suggesting right atrial pressure of 3 mmHg.   Comparison(s): Prior images reviewed side by side. Increase in mitral  regurgitation.     Patient Profile     66 y.o. female with a hx of chronic atrial fibrillation, type 2 DM, HTN, AML, morbid obesity who presented with weakness, poor PO intake and falls found to be septic with staph bacteremia. Course complicated by Afib with RVR for which Cardiology was consulted.  Assessment & Plan    #Permanent atrial fibrillation:  Patient with known permanent A-fib who developed RVR in the setting of sepsis. Has been started on IV amiodarone for rate control given hypotension; unlikely chance of chemical conversion given permanent Afib and planned short course.  She is not a candidate for anticoagulation given severe anemia/thrombocytopenia -Plan will be for rate control, suspect HR will improve with treatment of her sepsis -S/p dig load on 02/19/22; dig level 1; will start dig 0.'125mg'$  daily and repeat dig level in 2 days -Continue IV amiodarone for rate control for now (low risk of chemical cardioversion due to permanent Afib and patient cannot tolerate nodal agents due to hypotension) -Cannot tolerate AC due to severe  thrombocytopenia and anemia -Cannot tolerate BB/dilt due to hypotension  #Acute on Chronic Diastolic HF: Appears mildly volume up on exam in the setting of IVF administration for sepsis. TTE with  LVEF 50-55%, no significant valve disease. BNP 700. IVF stopped. Will attempt to diurese today with lasix '40mg'$  IV with re-dose as needed. Will need close monitoring of her blood pressure as has remained soft. -Re-dose lasix '40mg'$  IV and monitor response; will need to watch blood pressures closely -Will give potassium supplementation with IV lasix -Unable to tolerate GDMT due to soft blood pressures  #Sepsis secondary to staph bacteremia: Presented with fever, neutropenia.  Found to have staph bacteremia with left neck cellulitis and multifocal PNA on CT. Now on cefazloin. TTE with LVEF 50-55%, normal RV, no valvular vegetations visualized. Not a candidate for TEE at this time due to severe thrombocytopenia.  -TTE without significant valvular pathology -Not currently a candidate for TEE given severe thrombocytopenia.  Need platelet count greater than 50   #Acute Myeloid Leukemia #Pancytopenia: -Management per Onc and primary  For questions or updates, please contact [facility] Please consult [email or web address] for contact info under        Signed, Freada Bergeron, MD  02/22/2022, 10:31 AM

## 2022-02-22 NOTE — Progress Notes (Signed)
eLink Physician-Brief Progress Note Patient Name: Kaitlyn Hull DOB: [birth year] MRN: [id number]   Date of Service  02/22/2022  HPI/Events of Note  66/F with atrial fibrillation, DM, hypertension, leukemia and hyperthyroidism, treated for MSSA bacteremia in setting of parotitis and pneumonia with worsening respiratory distress, transferred to the ICU for BIPAP.   Pt is awake and tolerating BIPAP.  BP 109/80, HR 104-114, RR 25, o2 sats 100%  eICU Interventions  Change xanax to IV ativan for anxiety.  Pain meds changed to IV.  Will hold off on foley cath placement. External catheter in place at the moment.   Continue antibiotics.  Get ABG now.  SCDs for DVT prophylaxis.      Intervention Category Evaluation Type: New Patient Evaluation  Elsie Lincoln 02/22/2022, 10:17 PM

## 2022-02-22 NOTE — Progress Notes (Signed)
Notified by RN of change in patient status. This afternoon the patient's grown more apparently dyspneic. HR has continued climbing. I came to the bedside where PICC insertion was in progress. The patient reports gradually worsening shortness of breath throughout the day today, increased global weakness. No chest pain. Says they gave her a breathing treatment last night that may have helped but she's not sure. RN reports poor UOP documentation due to incontinence/pure wick malfunctioning, but feels there's been robust urine output today.   Due to sterile procedure in progress, was not able to listen to lungs, but she clearly looks in some respiratory distress, HR is irreg and sustaining in 130's. BP actually improved from earlier. Oxygenation is adequate on 2L O2. She's remained afebrile.   I'm worried that with continued infusions of blood products (8u platelets, 3 RBCs, and continued RVR with AFib, her cardiac output is not keeping up. Lungs sounded wet this morning and gradually worse through the day.   Discussed with cardiology, Dr. Johney Frame. Will rebolus amio, increase infusion rate to 60/hr. Given her improved BP, will trial IV metoprolol 2.'5mg'$  now. Will repeat lasix '40mg'$  IV x1 now. CXR ordered. Consulted PCCM due to worsening respiratory status and confirmed full code/full scope GOC based on my discussions with the patient and her daughter/boyfriend by phone. Spoke to them this morning and updated Abby on my concern for her mother, possible transfer to ICU, this evening.   Vance Gather, MD 02/22/2022 6:28 PM    CRITICAL CARE Performed by: [name]   Total critical care time: 45 minutes  Critical care time was exclusive of separately billable procedures and treating other patients.  Critical care was necessary to treat or prevent imminent or life-threatening deterioration.  Critical care was time spent personally by me on the following activities: development of treatment plan with  patient and/or surrogate as well as nursing, discussions with consultants, evaluation of patient's response to treatment, examination of patient, obtaining history from patient or surrogate, ordering and performing treatments and interventions, ordering and review of laboratory studies, ordering and review of radiographic studies, pulse oximetry and re-evaluation of patient's condition.

## 2022-02-22 NOTE — Progress Notes (Signed)
RR 30. O2 96% on 2L. HR 120-140s. BP 114/83.  MD notified.

## 2022-02-22 NOTE — Progress Notes (Signed)
Peripherally Inserted Central Catheter Placement  The IV Nurse has discussed with the patient and/or persons authorized to consent for the patient, the purpose of this procedure and the potential benefits and risks involved with this procedure.  The benefits include less needle sticks, lab draws from the catheter, and the patient may be discharged home with the catheter. Risks include, but not limited to, infection, bleeding, blood clot (thrombus formation), and puncture of an artery; nerve damage and irregular heartbeat and possibility to perform a PICC exchange if needed/ordered by physician.  Alternatives to this procedure were also discussed.  B[employer] PICC patient education guide, fact sheet on infection prevention and patient information card has been provided to patient /or left at bedside.    PICC Placement Documentation  PICC Double Lumen 30/05/11 Right Cephalic 38 cm 0 cm (Active)  Indication for Insertion or Continuance of Line Poor Vasculature-patient has had multiple peripheral attempts or PIVs lasting less than 24 hours;Prolonged intravenous therapies 02/22/22 1836  Exposed Catheter (cm) 0 cm 02/22/22 1836  Site Assessment Clean, Dry, Intact 02/22/22 1836  Lumen #1 Status Flushed;Saline locked;Blood return noted 02/22/22 1836  Lumen #2 Status Flushed;Saline locked;Blood return noted 02/22/22 1836  Dressing Type Transparent;Securing device 02/22/22 1836  Dressing Status Antimicrobial disc in place;Clean, Dry, Intact 02/22/22 1836  Safety Lock Not Applicable 04/16/15 3567  Dressing Intervention New dressing;Other (Comment) 02/22/22 1836  Dressing Change Due 03/01/22 02/22/22 1836    Already have a bruised on the [facility], swollen posterior RFA and pink prior to insertion.    Enos Fling 02/22/2022, 6:38 PM

## 2022-02-23 ENCOUNTER — Inpatient Hospital Stay (HOSPITAL_COMMUNITY): Payer: PPO

## 2022-02-23 DIAGNOSIS — C9200 Acute myeloblastic leukemia, not having achieved remission: Secondary | ICD-10-CM | POA: Diagnosis not present

## 2022-02-23 DIAGNOSIS — J9601 Acute respiratory failure with hypoxia: Secondary | ICD-10-CM | POA: Diagnosis not present

## 2022-02-23 DIAGNOSIS — B9561 Methicillin susceptible Staphylococcus aureus infection as the cause of diseases classified elsewhere: Secondary | ICD-10-CM | POA: Diagnosis not present

## 2022-02-23 DIAGNOSIS — I5031 Acute diastolic (congestive) heart failure: Secondary | ICD-10-CM

## 2022-02-23 DIAGNOSIS — R918 Other nonspecific abnormal finding of lung field: Secondary | ICD-10-CM | POA: Diagnosis not present

## 2022-02-23 DIAGNOSIS — R7881 Bacteremia: Secondary | ICD-10-CM | POA: Diagnosis not present

## 2022-02-23 DIAGNOSIS — I4891 Unspecified atrial fibrillation: Secondary | ICD-10-CM | POA: Diagnosis not present

## 2022-02-23 LAB — POCT I-STAT 7, (LYTES, BLD GAS, ICA,H+H)
Acid-base deficit: 5 mmol/L — ABNORMAL HIGH (ref 0.0–2.0)
Bicarbonate: 19.4 mmol/L — ABNORMAL LOW (ref 20.0–28.0)
Calcium, Ion: 1.23 mmol/L (ref 1.15–1.40)
HCT: 30 % — ABNORMAL LOW (ref 36.0–46.0)
Hemoglobin: 10.2 g/dL — ABNORMAL LOW (ref 12.0–15.0)
O2 Saturation: 99 %
Patient temperature: 97.4
Potassium: 4 mmol/L (ref 3.5–5.1)
Sodium: 136 mmol/L (ref 135–145)
TCO2: 20 mmol/L — ABNORMAL LOW (ref 22–32)
pCO2 arterial: 32.8 mmHg (ref 32–48)
pH, Arterial: 7.378 (ref 7.35–7.45)
pO2, Arterial: 157 mmHg — ABNORMAL HIGH (ref 83–108)

## 2022-02-23 LAB — CBC WITH DIFFERENTIAL/PLATELET
Abs Immature Granulocytes: 0 10*3/uL (ref 0.00–0.07)
Basophils Absolute: 0 10*3/uL (ref 0.0–0.1)
Basophils Relative: 0 %
Eosinophils Absolute: 0 10*3/uL (ref 0.0–0.5)
Eosinophils Relative: 0 %
HCT: 25 % — ABNORMAL LOW (ref 36.0–46.0)
HCT: 27 % — ABNORMAL LOW (ref 36.0–46.0)
Hemoglobin: 9.1 g/dL — ABNORMAL LOW (ref 12.0–15.0)
Hemoglobin: 10 g/dL — ABNORMAL LOW (ref 12.0–15.0)
Immature Granulocytes: 0 %
Lymphocytes Relative: 84 %
Lymphs Abs: 0.1 10*3/uL — ABNORMAL LOW (ref 0.7–4.0)
MCH: 31.2 pg (ref 26.0–34.0)
MCH: 31.1 pg (ref 26.0–34.0)
MCHC: 36.4 g/dL — ABNORMAL HIGH (ref 30.0–36.0)
MCHC: 37 g/dL — ABNORMAL HIGH (ref 30.0–36.0)
MCV: 85.6 fL (ref 80.0–100.0)
MCV: 83.9 fL (ref 80.0–100.0)
Monocytes Absolute: 0 10*3/uL — ABNORMAL LOW (ref 0.1–1.0)
Monocytes Relative: 8 %
Neutro Abs: 0 10*3/uL — CL (ref 1.7–7.7)
Neutrophils Relative %: 8 %
Platelets: 12 K/uL — CL (ref 150–400)
Platelets: 18 10*3/uL — CL (ref 150–400)
RBC: 2.92 MIL/uL — ABNORMAL LOW (ref 3.87–5.11)
RBC: 3.22 MIL/uL — ABNORMAL LOW (ref 3.87–5.11)
RDW: 18.6 % — ABNORMAL HIGH (ref 11.5–15.5)
RDW: 18.7 % — ABNORMAL HIGH (ref 11.5–15.5)
WBC: 0.1 K/uL — CL (ref 4.0–10.5)
WBC: 0.1 10*3/uL — CL (ref 4.0–10.5)
nRBC: 0 % (ref 0.0–0.2)
nRBC: 0 % (ref 0.0–0.2)

## 2022-02-23 LAB — GLUCOSE, CAPILLARY
Glucose-Capillary: 120 mg/dL — ABNORMAL HIGH (ref 70–99)
Glucose-Capillary: 124 mg/dL — ABNORMAL HIGH (ref 70–99)
Glucose-Capillary: 113 mg/dL — ABNORMAL HIGH (ref 70–99)
Glucose-Capillary: 113 mg/dL — ABNORMAL HIGH (ref 70–99)

## 2022-02-23 LAB — BASIC METABOLIC PANEL WITH GFR
Anion gap: 12 (ref 5–15)
BUN: 27 mg/dL — ABNORMAL HIGH (ref 8–23)
CO2: 21 mmol/L — ABNORMAL LOW (ref 22–32)
Calcium: 8.7 mg/dL — ABNORMAL LOW (ref 8.9–10.3)
Chloride: 104 mmol/L (ref 98–111)
Creatinine, Ser: 1.35 mg/dL — ABNORMAL HIGH (ref 0.44–1.00)
GFR, Estimated: 44 mL/min — ABNORMAL LOW (ref >60)
Glucose, Bld: 136 mg/dL — ABNORMAL HIGH (ref 70–99)
Potassium: 3.7 mmol/L (ref 3.5–5.1)
Sodium: 137 mmol/L (ref 135–145)

## 2022-02-23 LAB — BPAM PLATELET PHERESIS
Blood Product Expiration Date: 202312222359
ISSUE DATE / TIME: 202312211020
Unit Type and Rh: 6200

## 2022-02-23 LAB — ASPERGILLUS ANTIGEN, BAL/SERUM: Aspergillus Ag, BAL/Serum: 0.04 Index (ref 0.00–0.49)

## 2022-02-23 LAB — PREPARE PLATELET PHERESIS: Unit division: 0

## 2022-02-23 LAB — MAGNESIUM: Magnesium: 1.9 mg/dL (ref 1.7–2.4)

## 2022-02-23 LAB — BLASTOMYCES ANTIGEN: Blastomyces Antigen: NOT DETECTED ng/mL

## 2022-02-23 LAB — PATHOLOGIST SMEAR REVIEW

## 2022-02-23 MED ORDER — ALBUMIN HUMAN 5 % IV SOLN
25.0000 g | Freq: Once | INTRAVENOUS | Status: AC
  Administered 2022-02-23: 25 g via INTRAVENOUS
  Filled 2022-02-23: qty 500

## 2022-02-23 MED ORDER — AMIODARONE HCL IN DEXTROSE 360-4.14 MG/200ML-% IV SOLN
30.0000 mg/h | INTRAVENOUS | Status: DC
  Administered 2022-02-23: 30 mg/h via INTRAVENOUS

## 2022-02-23 MED ORDER — MAGNESIUM SULFATE 2 GM/50ML IV SOLN
2.0000 g | Freq: Once | INTRAVENOUS | Status: AC
  Administered 2022-02-23: 2 g via INTRAVENOUS
  Filled 2022-02-23: qty 50

## 2022-02-23 MED ORDER — AMIODARONE HCL IN DEXTROSE 360-4.14 MG/200ML-% IV SOLN
60.0000 mg/h | INTRAVENOUS | Status: DC
  Administered 2022-02-23 – 2022-02-24 (×2): 60 mg/h via INTRAVENOUS
  Filled 2022-02-23 (×3): qty 200

## 2022-02-23 MED ORDER — ACETAMINOPHEN 325 MG PO TABS
650.0000 mg | ORAL_TABLET | Freq: Once | ORAL | Status: AC
  Administered 2022-02-23: 650 mg via ORAL
  Filled 2022-02-23: qty 2

## 2022-02-23 MED ORDER — FUROSEMIDE 10 MG/ML IJ SOLN: 40.0000 mg | Freq: Two times a day (BID) | INTRAMUSCULAR | Status: DC

## 2022-02-23 MED ORDER — METOPROLOL TARTRATE 25 MG PO TABS
25.0000 mg | ORAL_TABLET | Freq: Two times a day (BID) | ORAL | Status: DC
  Administered 2022-02-23: 25 mg via ORAL
  Filled 2022-02-23: qty 1

## 2022-02-23 MED ORDER — FUROSEMIDE 40 MG PO TABS
40.0000 mg | ORAL_TABLET | Freq: Two times a day (BID) | ORAL | Status: DC
  Administered 2022-02-23: 40 mg via ORAL
  Filled 2022-02-23: qty 1

## 2022-02-23 MED ORDER — SODIUM CHLORIDE 0.9% IV SOLUTION: Freq: Once | INTRAVENOUS | Status: DC

## 2022-02-23 MED ORDER — FUROSEMIDE 40 MG PO TABS: 40.0000 mg | ORAL_TABLET | Freq: Two times a day (BID) | ORAL | Status: DC

## 2022-02-23 MED ORDER — SODIUM CHLORIDE 0.9 % IV BOLUS: 250.0000 mL | Freq: Once | INTRAVENOUS | Status: DC | PRN

## 2022-02-23 MED ORDER — AMIODARONE HCL IN DEXTROSE 360-4.14 MG/200ML-% IV SOLN
60.0000 mg/h | INTRAVENOUS | Status: AC
  Administered 2022-02-23 (×2): 60 mg/h via INTRAVENOUS
  Filled 2022-02-23: qty 200

## 2022-02-23 MED ORDER — SENNOSIDES-DOCUSATE SODIUM 8.6-50 MG PO TABS
1.0000 | ORAL_TABLET | Freq: Two times a day (BID) | ORAL | Status: DC
  Administered 2022-02-23 (×2): 1 via ORAL
  Filled 2022-02-23 (×2): qty 1

## 2022-02-23 MED ORDER — FUROSEMIDE 10 MG/ML IJ SOLN
40.0000 mg | Freq: Two times a day (BID) | INTRAMUSCULAR | Status: AC
  Administered 2022-02-23 (×2): 40 mg via INTRAVENOUS
  Filled 2022-02-23 (×2): qty 4

## 2022-02-23 NOTE — Progress Notes (Signed)
PCCM Progress Note  Asked to evaluate for tachypnea, AF/RVR, borderline hypotension - all seems to be worse in setting of a fever.   She is dyspneic and says she is tired. She does have some increased WOB saturating well on 2L. Lungs clear.  A/P  # AF/RVR # Fever # Hypotension  - heated high flow for work of breathing - tylenol 1g total - Mg 2g rapid bolus over 30 min - albumin 25 5% suspect she is third spacing - if refractory to above then repeat amio bolus, if hypotensive and HR sustained >150 then consideration of DCCV but probably low likelihood achieves lasting cardioversion in setting of her infection  Cc time 30 minutes  [place] Ferris

## 2022-02-23 NOTE — Progress Notes (Signed)
[facility]  Date of Admission:  02/20/2022       Abx: 12/17-c Cefazolin   12/16-17 cefepime/vanc  ASSESSMENT: 66 yo female aml on chemo (last dose decitabine 11/2021), chronic pancytopenia including severe neutropenia, admitted with mssa bacteremia with severe sepsis  She had a picc placed 11/2021 for chemo that was removed this admission  12/16 bcx mssa 12/18 bcx negative 12/17 respiratory viral pcr negative  12/16 "fluid cx" I am not clear what this is from and this was negative  Picc remove 12/17  12/18 admission neck ct that showed left parotitis and b.  12/19 chest ct bilateral multiple pulm nodular opacities. No sign/sx of pna. Concerning for fungal process although improved fever without treatment of such  Tte poor quality. Tee not able to be done due to severe thrombocytopenia  Mssa bsi from line vs parotitis or from parotitis then seeding the line? She has non obvious metastatic bone/joint complication of mssa bacteremia. She does have some redness around the left neck ?from parotitis complication -- no obvious abscess   -------------------- 12/22 assessment Left face-neck cellulitis/parotitis had improved/resolved Over night transferred to icu for increased work of breathing and o2 requirement in setting of >9 liters positive this admission and daily platelet transfusion. No fever. Suspect volume overload rather than TRALI or IFI. Xray also suggestive voluem overload  Feeling better with breathing with diuresis   12/21 mri lumbar spin eno osseous or pyogenic focus of infection  Blastomyces ag negative Aspergillus galactomannan negative Crypto negative  Pending urine histo ag    PLAN: F/u urine histo ag; appears though the pulm nodules are likely related to mssa infection Continue cefazolin 6 weeks from 12/18 Appreciate pulm/ccm managing hypoxemic resp failure/volume overload Discussed with pulm ccm    I spent  more than 50 minute reviewing data/chart, and coordinating care and >50% direct face to face time providing counseling/discussing diagnostics/treatment plan with patient    Principal Problem:   Atrial fibrillation with tachycardic ventricular rate ([place]) Active Problems:   Morbid obesity due to excess calories (HCC)   Chronic atrial fibrillation (HCC)   Acute Myeloid Leukemia/Abnormal blood smear/   Neutropenia (HCC)   Acute myeloid leukemia in adult [facility])   Neutropenic fever (HCC)   Sepsis (HCC)   MSSA bacteremia   Parotitis   Pulmonary nodules   Lumbar back pain   No Known Allergies  Scheduled Meds:  sodium chloride   Intravenous Once   Chlorhexidine Gluconate Cloth  6 each Topical Daily   digoxin  0.125 mg Oral Daily   feeding supplement (GLUCERNA SHAKE)  237 mL Oral BID BM   furosemide  40 mg Intravenous BID   insulin aspart  0-15 Units Subcutaneous TID WC   magnesium oxide  400 mg Oral BID   multivitamin with minerals  1 tablet Oral Daily   mouth rinse  15 mL Mouth Rinse 4 times per day   pravastatin  40 mg Oral q1800   senna-docusate  1 tablet Oral BID   sodium chloride flush  10-40 mL Intracatheter Q12H   Continuous Infusions:  amiodarone 60 mg/hr (02/23/22 1203)   amiodarone      ceFAZolin (ANCEF) IV 2 g (02/23/22 0508)   methocarbamol (ROBAXIN) IV 500 mg (02/22/22 0731)   PRN Meds:.[EXPIRED] acetaminophen **FOLLOWED BY** acetaminophen, albuterol, HYDROmorphone (DILAUDID) injection, LORazepam, methocarbamol (ROBAXIN) IV, metoprolol tartrate, ondansetron **OR** ondansetron (ZOFRAN) IV, mouth rinse, oxyCODONE, sodium chloride flush   SUBJECTIVE:  Afebrile; no n/v/diarrhea/headache No chest pain/cough New lower back pain last 48 hours very bad No LE neurologic deficit Xray increased volume overload superimposed on pna Mri spine no osseous abnormality  Review of Systems: ROS All other ROS was negative, except mentioned above     OBJECTIVE: Vitals:    02/23/22 1100 02/23/22 1115 02/23/22 1127 02/23/22 1146  BP: 105/79 102/69 111/88   Pulse: (!) 114 (!) 111 (!) 117   Resp: (!) 23 (!) 27 (!) 23   Temp: 97.6 F (36.4 C)  (!) 97.5 F (36.4 C) 97.6 F (36.4 C)  TempSrc: Oral  Oral Axillary  SpO2: 99% 100% 99%   Weight:      Height:       Body mass index is 49.23 kg/m.  Physical Exam  General/constitutional: no distress, pleasant, conversant, on o2 supplement via [place] (2 liter) HEENT: Normocephalic, PER, Conj Clear CV: rrr no mrg Lungs: clear to auscultation, normal respiratory effort Abd: Soft, Nontender Ext: no edema Skin: No Rash Neuro: nonfocal MSK: no peripheral joint swelling/tenderness/warmth        Lab Results Lab Results  Component Value Date   WBC 0.1 (LL) 02/23/2022   HGB 9.1 (L) 02/23/2022   HCT 25.0 (L) 02/23/2022   MCV 85.6 02/23/2022   PLT 12 (LL) 02/23/2022    Lab Results  Component Value Date   CREATININE 1.35 (H) 02/23/2022   BUN 27 (H) 02/23/2022   NA 137 02/23/2022   K 3.7 02/23/2022   CL 104 02/23/2022   CO2 21 (L) 02/23/2022    Lab Results  Component Value Date   ALT <5 02/22/2022   AST 13 (L) 02/22/2022   ALKPHOS 71 02/22/2022   BILITOT 1.3 (H) 02/22/2022      Microbiology: Recent Results (from the past 240 hour(s))  Resp panel by RT-PCR (RSV, Flu A&B, Covid) Anterior Nasal Swab     Status: None   Collection Time: 03/03/2022  8:59 AM   Specimen: Anterior Nasal Swab  Result Value Ref Range Status   SARS Coronavirus 2 by RT PCR NEGATIVE NEGATIVE Final    Comment: (NOTE) SARS-CoV-2 target nucleic acids are NOT DETECTED.  The SARS-CoV-2 RNA is generally detectable in upper respiratory specimens during the acute phase of infection. The lowest concentration of SARS-CoV-2 viral copies this assay can detect is 138 copies/mL. A negative result does not preclude SARS-Cov-2 infection and should not be used as the sole basis for treatment or other patient management decisions. A negative  result may occur with  improper specimen collection/handling, submission of specimen other than nasopharyngeal swab, presence of viral mutation(s) within the areas targeted by this assay, and inadequate number of viral copies(<138 copies/mL). A negative result must be combined with clinical observations, patient history, and epidemiological information. The expected result is Negative.  Fact Sheet for Patients:  EntrepreneurPulse.com.au  Fact Sheet for Healthcare Providers:  IncredibleEmployment.be  This test is no t yet approved or cleared by the [place] FDA and  has been authorized for detection and/or diagnosis of SARS-CoV-2 by FDA under an Emergency Use Authorization (EUA). This EUA will remain  in effect (meaning this test can be used) for the duration of the COVID-19 declaration under Section 564(b)(1) of the Act, 21 U.S.C.section 360bbb-3(b)(1), unless the authorization is terminated  or revoked sooner.       Influenza A by PCR NEGATIVE NEGATIVE Final   Influenza B by PCR NEGATIVE NEGATIVE Final    Comment: (NOTE) The Xpert Xpress SARS-CoV-2/FLU/RSV  plus assay is intended as an aid in the diagnosis of influenza from Nasopharyngeal swab specimens and should not be used as a sole basis for treatment. Nasal washings and aspirates are unacceptable for Xpert Xpress SARS-CoV-2/FLU/RSV testing.  Fact Sheet for Patients: EntrepreneurPulse.com.au  Fact Sheet for Healthcare Providers: IncredibleEmployment.be  This test is not yet approved or cleared by the [place] FDA and has been authorized for detection and/or diagnosis of SARS-CoV-2 by FDA under an Emergency Use Authorization (EUA). This EUA will remain in effect (meaning this test can be used) for the duration of the COVID-19 declaration under Section 564(b)(1) of the Act, 21 U.S.C. section 360bbb-3(b)(1), unless the authorization is  terminated or revoked.     Resp Syncytial Virus by PCR NEGATIVE NEGATIVE Final    Comment: (NOTE) Fact Sheet for Patients: EntrepreneurPulse.com.au  Fact Sheet for Healthcare Providers: IncredibleEmployment.be  This test is not yet approved or cleared by the [place] FDA and has been authorized for detection and/or diagnosis of SARS-CoV-2 by FDA under an Emergency Use Authorization (EUA). This EUA will remain in effect (meaning this test can be used) for the duration of the COVID-19 declaration under Section 564(b)(1) of the Act, 21 U.S.C. section 360bbb-3(b)(1), unless the authorization is terminated or revoked.  Performed at [facility], [address] [place], [place] [address]   Blood culture (routine x 2)     Status: Abnormal   Collection Time: 02/16/2022  3:19 PM   Specimen: BLOOD  Result Value Ref Range Status   Specimen Description BLOOD SITE NOT SPECIFIED  Final   Special Requests   Final    BOTTLES DRAWN AEROBIC AND ANAEROBIC Blood Culture results may not be optimal due to an inadequate volume of blood received in culture bottles   Culture  Setup Time   Final    GRAM POSITIVE COCCI IN BOTH AEROBIC AND ANAEROBIC BOTTLES CRITICAL VALUE NOTED.  VALUE IS CONSISTENT WITH PREVIOUSLY REPORTED AND CALLED VALUE.    Culture (A)  Final    STAPHYLOCOCCUS AUREUS SUSCEPTIBILITIES PERFORMED ON PREVIOUS CULTURE WITHIN THE LAST 5 DAYS. Performed at [facility], [place] [address], [place], [place] [address]    Report Status 02/20/2022 FINAL  Final  Blood culture (routine x 2)     Status: Abnormal   Collection Time: 02/12/2022  3:24 PM   Specimen: BLOOD  Result Value Ref Range Status   Specimen Description BLOOD SITE NOT SPECIFIED  Final   Special Requests   Final    BOTTLES DRAWN AEROBIC AND ANAEROBIC Blood Culture adequate volume   Culture  Setup Time   Final    GRAM POSITIVE COCCI IN PAIRS IN CLUSTERS IN BOTH AEROBIC AND ANAEROBIC  BOTTLES CRITICAL RESULT CALLED TO, READ BACK BY AND VERIFIED WITH: PHARMD [email or web address].W AT 7619 ON 02/18/2022 BY T.SAAD. Performed at [facility], [place] [address], [place], [place] [address]    Culture STAPHYLOCOCCUS AUREUS (A)  Final   Report Status 02/20/2022 FINAL  Final   Organism ID, Bacteria STAPHYLOCOCCUS AUREUS  Final      Susceptibility   Staphylococcus aureus - MIC*    CIPROFLOXACIN <=0.5 SENSITIVE Sensitive     ERYTHROMYCIN <=0.25 SENSITIVE Sensitive     GENTAMICIN <=0.5 SENSITIVE Sensitive     OXACILLIN <=0.25 SENSITIVE Sensitive     TETRACYCLINE <=1 SENSITIVE Sensitive     VANCOMYCIN 1 SENSITIVE Sensitive     TRIMETH/SULFA <=10 SENSITIVE Sensitive     CLINDAMYCIN <=0.25 SENSITIVE Sensitive  RIFAMPIN <=0.5 SENSITIVE Sensitive     Inducible Clindamycin NEGATIVE Sensitive     * STAPHYLOCOCCUS AUREUS  Blood Culture ID Panel (Reflexed)     Status: Abnormal   Collection Time: 02/23/2022  3:24 PM  Result Value Ref Range Status   Enterococcus faecalis NOT DETECTED NOT DETECTED Final   Enterococcus Faecium NOT DETECTED NOT DETECTED Final   Listeria monocytogenes NOT DETECTED NOT DETECTED Final   Staphylococcus species DETECTED (A) NOT DETECTED Final    Comment: CRITICAL RESULT CALLED TO, READ BACK BY AND VERIFIED WITH: PHARMD [email or web address] AT 1950 ON 02/18/2022 BY T.SAAD.    Staphylococcus aureus (BCID) DETECTED (A) NOT DETECTED Final    Comment: CRITICAL RESULT CALLED TO, READ BACK BY AND VERIFIED WITH: PHARMD [email or web address].W AT 9326 ON 02/18/2022 BY T.SAAD.    Staphylococcus epidermidis NOT DETECTED NOT DETECTED Final   Staphylococcus lugdunensis NOT DETECTED NOT DETECTED Final   Streptococcus species NOT DETECTED NOT DETECTED Final   Streptococcus agalactiae NOT DETECTED NOT DETECTED Final   Streptococcus pneumoniae NOT DETECTED NOT DETECTED Final   Streptococcus pyogenes NOT DETECTED NOT DETECTED Final   A.calcoaceticus-baumannii NOT DETECTED NOT DETECTED Final   Bacteroides  fragilis NOT DETECTED NOT DETECTED Final   Enterobacterales NOT DETECTED NOT DETECTED Final   Enterobacter cloacae complex NOT DETECTED NOT DETECTED Final   Escherichia coli NOT DETECTED NOT DETECTED Final   Klebsiella aerogenes NOT DETECTED NOT DETECTED Final   Klebsiella oxytoca NOT DETECTED NOT DETECTED Final   Klebsiella pneumoniae NOT DETECTED NOT DETECTED Final   Proteus species NOT DETECTED NOT DETECTED Final   Salmonella species NOT DETECTED NOT DETECTED Final   Serratia marcescens NOT DETECTED NOT DETECTED Final   Haemophilus influenzae NOT DETECTED NOT DETECTED Final   Neisseria meningitidis NOT DETECTED NOT DETECTED Final   Pseudomonas aeruginosa NOT DETECTED NOT DETECTED Final   Stenotrophomonas maltophilia NOT DETECTED NOT DETECTED Final   Candida albicans NOT DETECTED NOT DETECTED Final   Candida auris NOT DETECTED NOT DETECTED Final   Candida glabrata NOT DETECTED NOT DETECTED Final   Candida krusei NOT DETECTED NOT DETECTED Final   Candida parapsilosis NOT DETECTED NOT DETECTED Final   Candida tropicalis NOT DETECTED NOT DETECTED Final   Cryptococcus neoformans/gattii NOT DETECTED NOT DETECTED Final   Meth resistant mecA/C and MREJ NOT DETECTED NOT DETECTED Final    Comment: Performed at [facility], [address] [place], [place] [address]  Culture, body fluid w Gram Stain-bottle     Status: None   Collection Time: 02/19/2022  6:25 PM   Specimen: Fluid  Result Value Ref Range Status   Specimen Description FLUID  Final   Special Requests   Final    BOTTLES DRAWN AEROBIC AND ANAEROBIC Blood Culture results may not be optimal due to an excessive volume of blood received in culture bottles   Culture   Final    NO GROWTH 5 DAYS Performed at [facility], [place] [address], [place], [place] [address]    Report Status 02/22/2022 FINAL  Final  Gram stain     Status: None   Collection Time: 02/15/2022  6:26 PM   Specimen: Fluid  Result Value Ref Range Status    Specimen Description FLUID  Final   Special Requests NONE  Final   Gram Stain   Final    NO ORGANISMS SEEN NO WBC SEEN Performed at [facility], [place] [address] [place], [place] [address]    Report Status  02/07/2022 FINAL  Final  Respiratory (~20 pathogens) panel by PCR     Status: None   Collection Time: 02/18/22 10:58 AM   Specimen: Nasopharyngeal Swab; Respiratory  Result Value Ref Range Status   Adenovirus NOT DETECTED NOT DETECTED Final   Coronavirus 229E NOT DETECTED NOT DETECTED Final    Comment: (NOTE) The Coronavirus on the Respiratory Panel, DOES NOT test for the novel  Coronavirus (2019 nCoV)    Coronavirus HKU1 NOT DETECTED NOT DETECTED Final   Coronavirus NL63 NOT DETECTED NOT DETECTED Final   Coronavirus OC43 NOT DETECTED NOT DETECTED Final   Metapneumovirus NOT DETECTED NOT DETECTED Final   Rhinovirus / Enterovirus NOT DETECTED NOT DETECTED Final   Influenza A NOT DETECTED NOT DETECTED Final   Influenza B NOT DETECTED NOT DETECTED Final   Parainfluenza Virus 1 NOT DETECTED NOT DETECTED Final   Parainfluenza Virus 2 NOT DETECTED NOT DETECTED Final   Parainfluenza Virus 3 NOT DETECTED NOT DETECTED Final   Parainfluenza Virus 4 NOT DETECTED NOT DETECTED Final   Respiratory Syncytial Virus NOT DETECTED NOT DETECTED Final   Bordetella pertussis NOT DETECTED NOT DETECTED Final   Bordetella Parapertussis NOT DETECTED NOT DETECTED Final   Chlamydophila pneumoniae NOT DETECTED NOT DETECTED Final   Mycoplasma pneumoniae NOT DETECTED NOT DETECTED Final    Comment: Performed at [facility], [place]. [address] [place], [place] [address]  Culture, blood (Routine X 2) w Reflex to ID Panel     Status: None (Preliminary result)   Collection Time: 02/19/22  5:54 AM   Specimen: BLOOD  Result Value Ref Range Status   Specimen Description BLOOD BLOOD LEFT ARM  Final   Special Requests   Final    BOTTLES DRAWN AEROBIC ONLY Blood Culture adequate volume   Culture   Final     NO GROWTH 4 DAYS Performed at [facility], [place] [address], [place], [place] [address]    Report Status PENDING  Incomplete  Culture, blood (Routine X 2) w Reflex to ID Panel     Status: None (Preliminary result)   Collection Time: 02/19/22  5:54 AM   Specimen: BLOOD  Result Value Ref Range Status   Specimen Description BLOOD BLOOD LEFT HAND  Final   Special Requests   Final    BOTTLES DRAWN AEROBIC ONLY Blood Culture adequate volume   Culture   Final    NO GROWTH 4 DAYS Performed at [facility], [place] [address], [place], [place] [address]    Report Status PENDING  Incomplete  Blastomyces Antigen     Status: None   Collection Time: 02/20/22  2:11 AM   Specimen: Blood  Result Value Ref Range Status   Blastomyces Antigen None Detected None Detected ng/mL Final    Comment: (NOTE) Results reported as ng/mL in 0.2 - 14.7 ng/mL range Results above the limit of detection but below 0.2 ng/mL are reported as 'Positive, Below the Limit of Quantification' Results above 14.7 ng/mL are reported as 'Positive, Above the Limit of Quantification'    Specimen Type SERUM  Final    Comment: (NOTE) Performed At: [place], [place] [id number] Bruce Donath MD PN:[id number]   MRSA Next Gen by PCR, Nasal     Status: None   Collection Time: 02/22/22  7:42 PM   Specimen: Nasal Mucosa; Nasal Swab  Result Value Ref Range Status   MRSA by PCR Next Gen NOT DETECTED NOT DETECTED  Final    Comment: (NOTE) The GeneXpert MRSA Assay (FDA approved for NASAL specimens only), is one component of a comprehensive MRSA colonization surveillance program. It is not intended to diagnose MRSA infection nor to guide or monitor treatment for MRSA infections. Test performance is not FDA approved in patients less than 17 years old. Performed at [facility], [place] [address] [place], [place] [address]      Serology:   Imaging: If present, new  imagings (plain films, ct scans, and mri) have been personally visualized and interpreted; radiology reports have been reviewed. Decision making incorporated into the Impression / Recommendations.  12/17 tte Sonographer Comments: Image acquisition challenging due to patient body habitus and Image acquisition challenging due to respiratory motion.  IMPRESSIONS    1. Left ventricular ejection fraction, by estimation, is 50 to 55%. The left ventricle has low normal function. The left ventricle has no regional wall motion abnormalities. Left ventricular diastolic parameters are indeterminate.   2. Right ventricular systolic function is normal. The right ventricular size is normal. There is normal pulmonary artery systolic pressure.   3. The mitral valve was not well visualized. Moderate mitral valve regurgitation.   4. The aortic valve is tricuspid. There is mild calcification of the  aortic valve. There is mild thickening of the aortic valve. Aortic valve regurgitation is not visualized.   5. The inferior vena cava is normal in size with greater than 50% respiratory variability, suggesting right atrial pressure of 3 mmHg.   Comparison(s): Prior images reviewed side by side. Increase in mitral regurgitation.    12/18 ct soft tissue neck IMPRESSION: 1. Asymmetric density and heterogeneity of the left parotid gland compatible with Acute Parotitis, or possibly parotid injury in the setting of fall. No complicating features.   2. Multifocal bilateral upper lobe pulmonary opacity is new from aSeptember CTA and appears infectious/inflammatory. Consider Bilateral Pneumonia including viral/atypical etiologies.    12/18 cxr Scattered areas of ill-defined patchy and nodular airspace disease bilaterally. Imaging features raise concern for multifocal pneumonia. Follow-up recommended to ensure resolution.  12/19 chest ct 1. Multifocal infiltrate worrisome for pneumonia. Recommend follow-up imaging after  treatment to ensure resolution. 2. 8 mm nodule in the superior aspect of the left breast, not identified on the CT scan dated December 02, 2021. Recommend diagnostic mammography and ultrasound for further evaluation. 3. Calcified atherosclerotic change in the left coronary arteries. 4. Calcified atherosclerotic change in the thoracic aorta. 5. Cirrhotic morphology of the liver.  12/21 cxr FINDINGS: Cardiac silhouette is prominent. Increasing consolidation right mid lung consistent with pneumonia. Pulmonary vascular congestion likely pulmonary edema. Small left-sided pleural effusion. Right PICC tip distal SVC.   IMPRESSION: Findings suggest CHF with possible superimposed pneumonia on the right.  12/21 mri l-spine IMPRESSION: 1. No MRI evidence for acute infection within the lumbar spine. 2. Multifactorial degenerative changes at L3-4 and L4-5 with resultant mild to moderate canal and bilateral subarticular stenosis, with mild bilateral L4 foraminal narrowing. 3. Small disc protrusions at T12-L1 and L5-S1 without significant stenosis or overt neural impingement as above.    Jabier Mutton, [place] [place] [phone] pager    02/23/2022, 12:32 PM

## 2022-02-23 NOTE — Progress Notes (Signed)
[facility] Progress Note Patient Name: Kaitlyn Hull DOB: [birth year] MRN: [id number]   Date of Service  02/23/2022  HPI/Events of Note  Afib with RVR with rate 140-170, SBP 70's, RR 30 - 40. No camera in the room.  eICU Interventions  Amiodarone gtt rate increased to 60 mg / hour and the PCCM ground crew requested to evaluate the patient because they are in a no-camera room.        Frederik Pear 02/23/2022, 9:31 PM

## 2022-02-23 NOTE — Significant Event (Signed)
Rapid Response Event Note   Reason for Call :  HR-140-170s, RR-35, T-100.5  Initial Focused Assessment:  Pt lying in bed with eyes open. She is tachypneic, shivering.  She is alert and oriented, denies CP, dizziness, but does c/o SOB. Lungs diminished t/o. Skin warm/diaphoretic.  HR-140-160, BP-73/38, RR-30, SpO2-99% on 2L [place].   Interventions:  Tylenol PO 250cc NS bolus over 1 hour.  Amiodarone turned back up to '60mg'$  2g mag IV HHFNC PCXR Plan of Care:  Breathing is better on HHFNC 35L 40%. Give albumin and monitor response. Continue to monitor pt closely. Call RR if further assistance needed.   Event Summary:   MD Notified: Dr. Su Grand MD notified, Dr. Verlee Monte, PCCM MD and Nicki Reaper, [place] NP to bedside.  Call [place], Jaysion Ramseyer Anderson, RN

## 2022-02-23 NOTE — Progress Notes (Addendum)
Rounding Note    Patient Name: Kaitlyn Hull Date of Encounter: 02/23/2022  [place] Cardiologist: Carlyle Dolly, MD   Subjective   Patient with acute dyspnea yesterday evening along with worsening RVR. She was placed on BiPAP and moved to the medical ICU. This morning, patient is no longer requiring BiPAP, on [place]. She continues to feel short of breath but says that overall she feels improved compared with last night. Denies chest pain, palpitations.  Inpatient Medications    Scheduled Meds:  Chlorhexidine Gluconate Cloth  6 each Topical Daily   digoxin  0.125 mg Oral Daily   feeding supplement (GLUCERNA SHAKE)  237 mL Oral BID BM   furosemide  40 mg Oral BID   insulin aspart  0-15 Units Subcutaneous TID WC   magnesium oxide  400 mg Oral BID   metoprolol tartrate  25 mg Oral BID   multivitamin with minerals  1 tablet Oral Daily   mouth rinse  15 mL Mouth Rinse 4 times per day   pravastatin  40 mg Oral q1800   senna-docusate  1 tablet Oral BID   sodium chloride flush  10-40 mL Intracatheter Q12H   Continuous Infusions:  amiodarone 60 mg/hr (02/23/22 1000)    ceFAZolin (ANCEF) IV 2 g (02/23/22 0508)   methocarbamol (ROBAXIN) IV 500 mg (02/22/22 0731)   PRN Meds: [EXPIRED] acetaminophen **FOLLOWED BY** acetaminophen, albuterol, HYDROmorphone (DILAUDID) injection, LORazepam, methocarbamol (ROBAXIN) IV, metoprolol tartrate, ondansetron **OR** ondansetron (ZOFRAN) IV, mouth rinse, oxyCODONE, sodium chloride flush   Vital Signs    Vitals:   02/23/22 0745 02/23/22 0800 02/23/22 0900 02/23/22 1000  BP:  (!) 109/90 114/84 107/82  Pulse: (!) 103 (!) 107 (!) 101 (!) 114  Resp: (!) 26 (!) 25 (!) 22 (!) 26  Temp:      TempSrc:      SpO2: 99% 100% 99% 97%  Weight:      Height:        Intake/Output Summary (Last 24 hours) at 02/23/2022 1020 Last data filed at 02/23/2022 1000 Gross per 24 hour  Intake 1893.55 ml  Output 1700 ml  Net 193.55 ml       02/23/2022    4:24 AM 02/04/2022   11:56 PM 02/02/2022    8:42 AM  Last 3 Weights  Weight (lbs) 269 lb 2.9 oz 248 lb 9.6 oz 258 lb  Weight (kg) 122.1 kg 112.764 kg 117.028 kg      Telemetry    Persistent atrial fibrillation. RVR with ventricular rates 100-125 - Personally Reviewed  ECG    No new tracing  Physical Exam   GEN: Dyspneic appearing Neck: Unable to assess JVD due to body habitus Cardiac: Irregularly irregular and rapid, no murmurs, rubs, or gallops.  Respiratory: Poor inspiratory effort. Bibasilar crackles. Bilateral upper lobe end-expiratory wheeze also noted. GI: Soft, nontender, non-distended  MS: No edema; No deformity. Neuro:  Nonfocal  Psych: Normal affect   Labs    High Sensitivity Troponin:  No results for input(s): "TROPONINIHS" in the last 720 hours.   Chemistry Recent Labs  Lab 02/19/22 0347 02/20/22 0211 02/21/22 1020 02/22/22 0234 02/22/22 1926 02/22/22 2355 02/23/22 0545  NA 130* 134*   < > 135 136 136 137  K 3.2* 3.9   < > 3.6 4.2 4.0 3.7  CL 101 106   < > 106 105  --  104  CO2 20* 17*   < > 20* 20*  --  21*  GLUCOSE  108* 116*   < > 103* 109*  --  136*  BUN 21 19   < > 19 24*  --  27*  CREATININE 1.30* 1.20*   < > 1.15* 1.30*  --  1.35*  CALCIUM 7.6* 7.9*   < > 8.1* 8.9  --  8.7*  MG 1.6* 2.0  --  1.8 1.9  --  1.9  PROT 5.4* 5.2*  --   --  5.6*  --   --   ALBUMIN 2.1* 1.9*  --   --  1.7*  --   --   AST 31 24  --   --  13*  --   --   ALT 23 13  --   --  <5  --   --   ALKPHOS 40 49  --   --  71  --   --   BILITOT 1.8* 2.2*  --   --  1.3*  --   --   GFRNONAA 46* 50*   < > 53* 46*  --  44*  ANIONGAP 9 11   < > 9 11  --  12   < > = values in this interval not displayed.    Lipids No results for input(s): "CHOL", "TRIG", "HDL", "LABVLDL", "LDLCALC", "CHOLHDL" in the last 168 hours.  Hematology Recent Labs  Lab 02/22/22 0234 02/22/22 1926 02/22/22 2355 02/23/22 0545  WBC 0.2* <0.1*  --  0.1*  RBC 2.81* 3.05*  --  2.92*  HGB 8.8*  9.3* 10.2* 9.1*  HCT 23.6* 26.3* 30.0* 25.0*  MCV 84.0 86.2  --  85.6  MCH 31.3 30.5  --  31.2  MCHC 37.3* 35.4  --  36.4*  RDW 18.1* 18.7*  --  18.6*  PLT 13* 17*  --  12*   Thyroid No results for input(s): "TSH", "FREET4" in the last 168 hours.  BNP Recent Labs  Lab 03/04/2022 0902 02/19/22 0347 02/21/22 1020  BNP 370.0* 499.1* 791.8*    DDimer No results for input(s): "DDIMER" in the last 168 hours.   Radiology    DG CHEST PORT 1 VIEW  Result Date: 02/22/2022 CLINICAL DATA:  Respiratory failure EXAM: PORTABLE CHEST 1 VIEW COMPARISON:  02/19/2022 FINDINGS: Cardiac silhouette is prominent. Increasing consolidation right mid lung consistent with pneumonia. Pulmonary vascular congestion likely pulmonary edema. Small left-sided pleural effusion. Right PICC tip distal SVC. IMPRESSION: Findings suggest CHF with possible superimposed pneumonia on the right. Electronically Signed   By: Sammie Bench M.D.   On: 02/22/2022 19:09   MR LUMBAR SPINE WO CONTRAST  Result Date: 02/22/2022 CLINICAL DATA:  Initial evaluation for low back pain, infection suspected, bacteremia. EXAM: MRI LUMBAR SPINE WITHOUT CONTRAST TECHNIQUE: Multiplanar, multisequence MR imaging of the lumbar spine was performed. No intravenous contrast was administered. COMPARISON:  None Available. FINDINGS: Segmentation: Standard. Lowest well-formed disc space labeled the L5-S1 level. Alignment: Straightening of the normal lumbar lordosis with slight focal kyphotic angulation at the T12-L1 level. No significant listhesis. Vertebrae: Mild chronic height loss present at the superior endplate of L1. Vertebral body height otherwise maintained with no acute or recent fracture. Bone marrow signal intensity within normal limits. No worrisome osseous lesions. Mild reactive endplate changes about the L5-S1 interspace favored to be degenerative in nature. No other abnormal marrow edema. No evidence for osteomyelitis discitis or septic  arthritis. Conus medullaris and cauda equina: Conus extends to the L1 level. Conus and cauda equina appear normal. No epidural collections. Paraspinal and other  soft tissues: Mild diffuse edema within the posterior paraspinous soft tissues, favored to be related to overall volume status. No collections or significant inflammatory changes. Visualized visceral structures unremarkable. Disc levels: T12-L1: Broad-based right subarticular to foraminal disc protrusion mildly flattens the right ventral thecal sac. Mild facet hypertrophy. Resultant mild narrowing of the right lateral recess. Central canal remains patent. No foraminal stenosis. L1-2:  Negative interspace.  Mild facet hypertrophy.  No stenosis. L2-3: Negative interspace. Mild bilateral facet hypertrophy. No stenosis. L3-4: Mild diffuse disc bulge with disc desiccation. Associated reactive endplate spurring. Mild to moderate facet and ligament flavum hypertrophy with associated small joint effusions. Resultant mild-to-moderate canal with bilateral subarticular stenosis. Foramina remain patent. L4-5: Diffuse disc bulge with disc desiccation. Mild reactive endplate spurring, worse on the left. Mild to moderate facet and ligament flavum hypertrophy with associated trace joint effusion on the right. Resultant moderate canal with bilateral subarticular stenosis. Mild bilateral L4 foraminal narrowing. L5-S1: Mild disc bulge with superimposed tiny central disc protrusion. Mild to moderate facet hypertrophy. Resultant mild narrowing of the lateral recesses bilaterally. Central canal remains patent. No significant foraminal stenosis. IMPRESSION: 1. No MRI evidence for acute infection within the lumbar spine. 2. Multifactorial degenerative changes at L3-4 and L4-5 with resultant mild to moderate canal and bilateral subarticular stenosis, with mild bilateral L4 foraminal narrowing. 3. Small disc protrusions at T12-L1 and L5-S1 without significant stenosis or overt neural  impingement as above. Electronically Signed   By: Jeannine Boga M.D.   On: 02/22/2022 18:19   [place] EKG SITE RITE  Result Date: 02/22/2022 If Site Rite image not attached, placement could not be confirmed due to current cardiac rhythm.   Cardiac Studies   02/18/22 TTE  IMPRESSIONS     1. Left ventricular ejection fraction, by estimation, is 50 to 55%. The  left ventricle has low normal function. The left ventricle has no regional  wall motion abnormalities. Left ventricular diastolic parameters are  indeterminate.   2. Right ventricular systolic function is normal. The right ventricular  size is normal. There is normal pulmonary artery systolic pressure.   3. The mitral valve was not well visualized. Moderate mitral valve  regurgitation.   4. The aortic valve is tricuspid. There is mild calcification of the  aortic valve. There is mild thickening of the aortic valve. Aortic valve  regurgitation is not visualized.   5. The inferior vena cava is normal in size with greater than 50%  respiratory variability, suggesting right atrial pressure of 3 mmHg.   Comparison(s): Prior images reviewed side by side. Increase in mitral  regurgitation.   FINDINGS   Left Ventricle: Left ventricular ejection fraction, by estimation, is 50  to 55%. The left ventricle has low normal function. The left ventricle has  no regional wall motion abnormalities. The left ventricular internal  cavity size was normal in size.  There is no left ventricular hypertrophy. Left ventricular diastolic  parameters are indeterminate.   Right Ventricle: The right ventricular size is normal. No increase in  right ventricular wall thickness. Right ventricular systolic function is  normal. There is normal pulmonary artery systolic pressure. The tricuspid  regurgitant velocity is 1.54 m/s, and   with an assumed right atrial pressure of 3 mmHg, the estimated right  ventricular systolic pressure is 29.9 mmHg.    Left Atrium: Left atrial size was normal in size.   Right Atrium: Right atrial size was normal in size.   Pericardium: Trivial pericardial effusion is present. Presence  of  epicardial fat layer.   Mitral Valve: The mitral valve was not well visualized. Moderate mitral  valve regurgitation.   Tricuspid Valve: The tricuspid valve is normal in structure. Tricuspid  valve regurgitation is not demonstrated. No evidence of tricuspid  stenosis.   Aortic Valve: The aortic valve is tricuspid. There is mild calcification  of the aortic valve. There is mild thickening of the aortic valve. Aortic  valve regurgitation is not visualized.   Pulmonic Valve: The pulmonic valve was grossly normal. Pulmonic valve  regurgitation is not visualized. No evidence of pulmonic stenosis.   Aorta: The aortic root and ascending aorta are structurally normal, with  no evidence of dilitation.   Venous: The inferior vena cava is normal in size with greater than 50%  respiratory variability, suggesting right atrial pressure of 3 mmHg.   IAS/Shunts: No atrial level shunt detected by color flow Doppler.    Patient Profile     66 y.o. female with a hx of chronic atrial fibrillation, type 2 DM, HTN, AML, morbid obesity who presented with weakness, poor PO intake and falls found to be septic with staph bacteremia. Course complicated by Afib with RVR for which Cardiology was consulted.   Assessment & Plan    Permanent Atrial Fibrillation  Patient with known hx of permanent afib, developed RVR in the setting of sepsis. Due to acute hypotension, was started on IV amiodarone for rate control. Patient not a candidate for anticoagulation due to severe anemia and thrombocytopenia. She was loaded on digoxin on 02/19/22, now on PO digoxin 0.'125mg'$  daily. Unfortunately patient with acute worsening of RVR in the setting of dyspnea overnight, received repeat '150mg'$  bolus of amiodarone.  Continue IV amiodarone '60mg'$ /hr for  rate control. Rates moderately improved today. Suspect that she will continue to show improvement with infection management and improved respiratory status. Low risk of chemical cardioversion given long-standing afib. As rates hopefully improve, could consider decreasing dose to '30mg'$ /hr. Patient continues with low-normal BP, would not use nodal blocking agents at this time.  Continue Digoxin 0.'125mg'$  daily, plan to check dig level tomorrow.    Acute on Chronic Diastolic Congestive Heart Failure  Patient net positive ~9L this admission 2/2 IVF administration for sepsis. BNP 700. TTE on 12/17 with LVEF 50-55%. Unfortunately developed acute dyspnea yesterday evening requiring BiPAP and transfer to the ICU. Improved today following BiPAP and IV diuresis (received IV lasix '40mg'$  x2).   Patient continues to have evidence of pulmonary edema with bibasilar rales. Though O2 requirements have decreased, she still continues to appear quite dyspneic. Would suggest IV lasix this morning with possible transition to oral this evening.  Maintain K>4 with diuresis Defer GDMT adjustment until patient is more hemodynamically stable.  Sepsis with staph bacteremia  Presented with fever, neutropenia. Found to have staph bacteremia with left neck cellulitis and multifocal PNA on CT. Now on cefazloin. TTE with LVEF 50-55%, normal RV, no valvular vegetations visualized. Unfortunately not a candidate for TEE at this time due to severe thrombocytopenia (plt need to be >50).   Acute Myeloid Leukemia Pancytopenia -Management per Onc and primary        For questions or updates, please contact [place] Please consult [email or web address] for contact info under        Signed, Lily Kocher, PA-C  02/23/2022, 10:20 AM    Patient seen and examined and agree with Lily Kocher, PA-C as detailed above.  In brief, the patient is a 66 y.o. female with a  hx of permanent atrial fibrillation, type 2 DM, HTN, AML,  morbid obesity who presented with weakness, poor PO intake and falls found to be septic with staph bacteremia. Course complicated by Afib with RVR for which Cardiology was consulted.   Overnight, patient developed worsening respiratory status with persistent Afib with RVR. She was placed on BiPAP and transferred to the ICU. At that time, she was given additional lasix '40mg'$  IV and re-bolused amiodarone. Currently, she is doing much better. Breathing is much improved on 2L [place]. Remains in Afib but HR better controlled overall.   Will continue lasix IV today and reassess tomorrow. Will continue amiodarone gtt at this time. Unfortunately, rate control option are very limited due to hypotension. Has been started on digoxin in the interim with level pending for tomorrow. She cannot tolerate AC due to profound thrombocytopenia and anemia.   GEN: Ill appearing but NAD   Neck: JVD to mid neck Cardiac: Tachycardic, irregular, no murmurs Respiratory: Bibasilar crackles with scattered rhonchi GI: Soft, nontender, non-distended  MS: 1+ edema. Thighs are warm (distal extremities are cool) Neuro:  Nonfocal  Psych: Normal affect    Plan: -Continue lasix '40mg'$  IV BID for today and reassess volume status tomorrow -Continue amiodarone gtt for today; unfortunately she has limited options given hypotension -Continue digoxin 0.'125mg'$  daily with level tomorrow -Cannot tolerate AC due to thrombocytopenia and anemia -Management of AML per onc and primary -Management of MSSA bacteremia/sepsis per primary -Agree with ongoing discussions regarding GOC  CRITICAL CARE TIME: I have spent a total of 35 minutes with patient reviewing hospital notes, telemetry, EKGs, labs and examining the patient as well as establishing an assessment and plan that was discussed with the patient.  > 50% of time was spent in direct patient care. The patient is critically ill with multi-organ system failure and requires high complexity decision  making for assessment and support, frequent evaluation and titration of therapies, application of advanced monitoring technologies and extensive interpretation of multiple databases.   Gwyndolyn Kaufman, MD

## 2022-02-23 NOTE — Progress Notes (Signed)
NAME:  BREYONA SWANDER, MRN:  [id number], DOB:  09/11/1955, LOS: 6 ADMISSION DATE:  02/27/2022, CONSULTATION DATE: 02/20/2022 REFERRING MD: Triad, CHIEF COMPLAINT: Questionable thoracentesis for fluid evaluation of aspergillosis.  History of Present Illness:  66 year old female with past medical history as below, which is significant for AF, DM, HTN, and MAL currently on chemotherapy. She was admitted to [facility] 12/16 with a 2 day history of weakness, poor PO intake and falls. Workup discovered MSSA bacteremia in the setting of parotitis and multifocal pneumonia. 12/21 she became acutely short of breath accompanied by tachycardia. PCCM was consulted.    Pertinent  Medical History   has a past medical history of Atrial fibrillation with RVR ([place]) (06/2013), Breast cancer ([place]) (2007), Breast disorder, Diabetes mellitus, Dysrhythmia, Hematuria (01/20/2014), Hypertension, Hyperthyroidism (06/2013), Leukemia ([place]), Obesity, PMB (postmenopausal bleeding) (07/24/2012), Psoriasis, and Urinary frequency (01/20/2014).   Significant Hospital Events: Including procedures, antibiotic start and stop dates in addition to other pertinent events   12/16 admit 12/21 tx to ICU for BiPAP  Interim History / Subjective:  No acute overnight events. Awake this morning tolerated BiPAP overnight. HR improved to upper 90s. Patient states she is feeling well.  Objective   Blood pressure 112/80, pulse 89, temperature (!) 96.6 F (35.9 C), temperature source Axillary, resp. rate (!) 23, height '5\' 2"'$  (1.575 m), weight 122.1 kg, last menstrual period 11/17/2014, SpO2 100 %.    Vent Mode: PCV;BIPAP FiO2 (%):  [30 %-40 %] 30 % Set Rate:  [12 bmp] 12 bmp PEEP:  [5 cmH20] 5 cmH20   Intake/Output Summary (Last 24 hours) at 02/23/2022 0705 Last data filed at 02/23/2022 0600 Gross per 24 hour  Intake 2000.83 ml  Output 1100 ml  Net 900.83 ml    Filed Weights   02/25/2022 0842 02/10/2022 2356 02/23/22 0424  Weight:  117 kg 112.8 kg 122.1 kg    Examination: General: Obese middle aged female in moderate respiratory distress.  HENT: [place]/AT, unable to appreciate JVD, Warmth over left parotid gland Lungs: Coarse breath sounds throughout bilateral lung fields  Cardiovascular: irregular, normal rate, no murmur Abdomen: Soft, NT, ND Extremities: 1+ lower extremity edema Neuro: Alert, oriented, non-focal  12/16 BC MSSA 12/18 BC neg 12/17 RVP negative  Resolved Hospital Problem list     Assessment & Plan:  Acute hypoxemic respiratory failure: etiology uncertain. Likely multifactorial. Hopeful this is pulmonary edema and multifocal pneumonia. Much improved transitioned back to 2 L [place] - Continue lasix IV 40 mg  - Keep Sat > 92% - monitor I/ox - transfer back to progressive   MSSA bacteremia Neutropenic fever Multifocal pneumonia Left parotitis - Continue cefazolin - ID following - Fungal workup pending, cryptococcal Ag negative, aspergillus and histoplasma pending -No joint involvement on imaging -TTE low quality, unable to obtain TEE due to thrombocytopenia  Permanent AF with RVR HR improved with diuresis - Cardiology following - continue amiodarone infusion  - On digoxin - Start metoprolol 25 mg twice daily - AC on hold in the setting of thrombocytopenia  Acute on chronic HFpEF - Management per cardiology - Continue to appear fluid overloaded - Continue diuresis   AML Pancytopenia - Receiving venetoclax/decitabine (last cycle started 11/27).  Currently holding venetoclax. - S/p 7 units platelets (12/16, 12/17, 12/18x2, 12/19x2, 12/21) - S/p 3u PRBCs (12/17x2, 12/20) - Trend CBC - palliative care has been consulted - platelet goal >20K, transfuse 1 unit platelets today   DM - CBG monitoring and SSI  Best  Practice (right click and "Reselect all SmartList Selections" daily)   Diet/type: NPO DVT prophylaxis: not indicated GI prophylaxis: PPI Lines: N/A Foley:  N/A Code  Status:  full code Last date of multidisciplinary goals of care discussion [12/21 Full code]  Labs   CBC: Recent Labs  Lab 02/20/2022 0845 02/18/22 0430 02/18/22 1035 02/21/22 0537 02/21/22 1950 02/22/22 0234 02/22/22 1926 02/22/22 2355 02/23/22 0545  WBC 0.1* DUPL   < > <0.1* 0.1* 0.2* <0.1*  --  0.1*  NEUTROABS 0.0* PENDING  --   --   --  0.0*  --   --  PENDING  HGB 7.9* DUPL   < > 7.2* 10.0* 8.8* 9.3* 10.2* 9.1*  HCT 23.0* DUPL   < > 19.8* 28.0* 23.6* 26.3* 30.0* 25.0*  MCV 88.8 DUPL   < > 85.7 85.4 84.0 86.2  --  85.6  PLT 7* DUPL   < > 26* 20* 13* 17*  --  12*   < > = values in this interval not displayed.     Basic Metabolic Panel: Recent Labs  Lab 02/19/22 0347 02/20/22 0211 02/21/22 1020 02/22/22 0234 02/22/22 1926 02/22/22 2355 02/23/22 0545  NA 130* 134* 137 135 136 136 137  K 3.2* 3.9 2.8* 3.6 4.2 4.0 3.7  CL 101 106 109 106 105  --  104  CO2 20* 17* 19* 20* 20*  --  21*  GLUCOSE 108* 116* 157* 103* 109*  --  136*  BUN '21 19 17 19 '$ 24*  --  27*  CREATININE 1.30* 1.20* 1.02* 1.15* 1.30*  --  1.35*  CALCIUM 7.6* 7.9* 7.5* 8.1* 8.9  --  8.7*  MG 1.6* 2.0  --  1.8 1.9  --  1.9  PHOS 3.1 2.3*  --   --  3.8  --   --     GFR: Estimated Creatinine Clearance: 51.7 mL/min (A) (by C-G formula based on SCr of 1.35 mg/dL (H)). Recent Labs  Lab 02/18/2022 0846 02/14/2022 1040 02/18/22 0430 02/21/22 1950 02/22/22 0234 02/22/22 1926 02/23/22 0545  WBC  --   --    < > 0.1* 0.2* <0.1* 0.1*  LATICACIDVEN 2.4* 2.0*  --   --   --   --   --    < > = values in this interval not displayed.     Liver Function Tests: Recent Labs  Lab 02/25/2022 0845 02/19/22 0347 02/20/22 0211 02/22/22 1926  AST 34 31 24 13*  ALT '19 23 13 '$ <5  ALKPHOS 68 40 49 71  BILITOT 3.1* 1.8* 2.2* 1.3*  PROT 7.3 5.4* 5.2* 5.6*  ALBUMIN 3.3* 2.1* 1.9* 1.7*    No results for input(s): "LIPASE", "AMYLASE" in the last 168 hours. No results for input(s): "AMMONIA" in the last 168  hours.  ABG    Component Value Date/Time   PHART 7.378 02/22/2022 2355   PCO2ART 32.8 02/22/2022 2355   PO2ART 157 (H) 02/22/2022 2355   HCO3 19.4 (L) 02/22/2022 2355   TCO2 20 (L) 02/22/2022 2355   ACIDBASEDEF 5.0 (H) 02/22/2022 2355   O2SAT 99 02/22/2022 2355     Coagulation Profile: No results for input(s): "INR", "PROTIME" in the last 168 hours.  Cardiac Enzymes: No results for input(s): "CKTOTAL", "CKMB", "CKMBINDEX", "TROPONINI" in the last 168 hours.  HbA1C: Hgb A1c MFr Bld  Date/Time Value Ref Range Status  02/18/2022 10:35 AM 5.6 4.8 - 5.6 % Final    Comment:    (NOTE)  Prediabetes: 5.7 - 6.4         Diabetes: >6.4         Glycemic control for adults with diabetes: <7.0   10/26/2021 07:45 PM 6.2 (H) 4.8 - 5.6 % Final    Comment:    (NOTE)         Prediabetes: 5.7 - 6.4         Diabetes: >6.4         Glycemic control for adults with diabetes: <7.0     CBG: Recent Labs  Lab 02/22/22 0800 02/22/22 1126 02/22/22 1607 02/22/22 2010 02/22/22 2305  GLUCAP 118* 122* 106* 99 117*     Review of Systems:   10 point review of system taken, please see HPI for positives and negatives.   Past Medical History:  She,  has a past medical history of Atrial fibrillation with RVR ([place]) (06/2013), Breast cancer ([place]) (2007), Breast disorder, Diabetes mellitus, Dysrhythmia, Hematuria (01/20/2014), Hypertension, Hyperthyroidism (06/2013), Leukemia ([place]), Obesity, PMB (postmenopausal bleeding) (07/24/2012), Psoriasis, and Urinary frequency (01/20/2014).   Surgical History:   Past Surgical History:  Procedure Laterality Date   BREAST BIOPSY Left 09/14/2020   Procedure: BREAST BIOPSY;  Surgeon: Aviva Signs, MD;  Location: [facility] ORS;  Service: General;  Laterality: Left;   BREAST SURGERY  02/20/06   left side    CESAREAN SECTION  1995   COLONOSCOPY WITH PROPOFOL N/A 10/28/2021   Procedure: COLONOSCOPY WITH PROPOFOL;  Surgeon: Daneil Dolin, MD;  Location: [facility]  ENDO SUITE;  Service: Endoscopy;  Laterality: N/A;   ESOPHAGOGASTRODUODENOSCOPY (EGD) WITH PROPOFOL N/A 10/28/2021   Procedure: ESOPHAGOGASTRODUODENOSCOPY (EGD) WITH PROPOFOL;  Surgeon: Daneil Dolin, MD;  Location: [facility] ENDO SUITE;  Service: Endoscopy;  Laterality: N/A;   HYSTEROSCOPY WITH D & C N/A 08/13/2012   Procedure: DILATATION AND CURETTAGE /HYSTEROSCOPY;  Surgeon: Florian Buff, MD;  Location: [facility] ORS;  Service: Gynecology;  Laterality: N/A;   SECONDARY CLOSURE OF WOUND Left 02/20/2021   Procedure: SIMPLE WOUND CLOSURE;  Surgeon: Aviva Signs, MD;  Location: [facility] ORS;  Service: General;  Laterality: Left;     Social History:   reports that she quit smoking about 18 years ago. Her smoking use included cigarettes. She started smoking about 46 years ago. She has a 0.01 pack-year smoking history. She has never used smokeless tobacco. She reports that she does not drink alcohol and does not use drugs.   Family History:  Her family history includes Alzheimer's disease in her mother; Breast cancer in her sister; CAD in her father; Diabetes in her sister and sister; Heart failure in her father; Hypertension in her maternal grandmother; Other in her sister. There is no history of Colon cancer.   Allergies No Known Allergies   Home Medications  Prior to Admission medications   Medication Sig Start Date End Date Taking? Authorizing Provider  acetaminophen (TYLENOL) 500 MG tablet Take 500 mg by mouth every 6 (six) hours as needed. Taken as needed for headache   Yes [provider]  ALPRAZolam (XANAX) 0.5 MG tablet Take 1 tablet by mouth 2 (two) times daily as needed for anxiety. 10/30/21 10/30/22 Yes [provider]  apixaban (ELIQUIS) 5 MG TABS tablet Take 2.5 mg by mouth 2 (two) times daily. 1/2 tablet BID   Yes [provider]  furosemide (LASIX) 20 MG tablet Take 1 tablet (20 mg total) by mouth daily as needed. Patient taking differently: Take 20 mg by mouth daily as  needed for fluid.  11/30/21  Yes Derek Jack, MD  LORazepam (ATIVAN) 0.5 MG tablet Take 1 tablet (0.5 mg total) by mouth 2 (two) times daily as needed for anxiety. 12/19/21  Yes Derek Jack, MD  magnesium oxide (MAG-OX) 400 (240 Mg) MG tablet Take 1 tablet by mouth 2 (two) times daily. 12/26/21  Yes [provider]  metFORMIN (GLUCOPHAGE-XR) 500 MG 24 hr tablet Take 500-1,000 mg by mouth See admin instructions. Take 500 mg by mouth in the morning and 1000 mg at bedtime 03/26/18  Yes [provider]  metoprolol tartrate (LOPRESSOR) 100 MG tablet Take 1 tablet (100 mg total) by mouth 2 (two) times daily. 11/23/21 11/18/22 Yes BranchAlphonse Guild, MD  pravastatin (PRAVACHOL) 40 MG tablet Take 40 mg by mouth at bedtime.   Yes [provider]  venetoclax (VENCLEXTA) 100 MG tablet Take 400 mg by mouth daily. 01/23/22  Yes [provider]    Iona Beard, MD 02/23/22 11:35 AM [facility] PGY-3 Pager [phone]

## 2022-02-24 ENCOUNTER — Inpatient Hospital Stay (HOSPITAL_COMMUNITY): Payer: PPO

## 2022-02-24 DIAGNOSIS — Z9911 Dependence on respirator [ventilator] status: Secondary | ICD-10-CM

## 2022-02-24 DIAGNOSIS — I4891 Unspecified atrial fibrillation: Secondary | ICD-10-CM | POA: Diagnosis not present

## 2022-02-24 DIAGNOSIS — J9601 Acute respiratory failure with hypoxia: Secondary | ICD-10-CM

## 2022-02-24 DIAGNOSIS — J189 Pneumonia, unspecified organism: Secondary | ICD-10-CM

## 2022-02-24 DIAGNOSIS — D701 Agranulocytosis secondary to cancer chemotherapy: Secondary | ICD-10-CM | POA: Diagnosis not present

## 2022-02-24 DIAGNOSIS — A419 Sepsis, unspecified organism: Secondary | ICD-10-CM

## 2022-02-24 LAB — BASIC METABOLIC PANEL
Anion gap: 12 (ref 5–15)
BUN: 35 mg/dL — ABNORMAL HIGH (ref 8–23)
CO2: 19 mmol/L — ABNORMAL LOW (ref 22–32)
Calcium: 7.8 mg/dL — ABNORMAL LOW (ref 8.9–10.3)
Chloride: 103 mmol/L (ref 98–111)
Creatinine, Ser: 1.54 mg/dL — ABNORMAL HIGH (ref 0.44–1.00)
GFR, Estimated: 37 mL/min — ABNORMAL LOW (ref >60)
Glucose, Bld: 221 mg/dL — ABNORMAL HIGH (ref 70–99)
Potassium: 2.6 mmol/L — CL (ref 3.5–5.1)
Sodium: 134 mmol/L — ABNORMAL LOW (ref 135–145)

## 2022-02-24 LAB — POCT I-STAT 7, (LYTES, BLD GAS, ICA,H+H)
Acid-base deficit: 8 mmol/L — ABNORMAL HIGH (ref 0.0–2.0)
Acid-base deficit: 14 mmol/L — ABNORMAL HIGH (ref 0.0–2.0)
Bicarbonate: 16.3 mmol/L — ABNORMAL LOW (ref 20.0–28.0)
Bicarbonate: 16.1 mmol/L — ABNORMAL LOW (ref 20.0–28.0)
Calcium, Ion: 1.16 mmol/L (ref 1.15–1.40)
Calcium, Ion: 1.17 mmol/L (ref 1.15–1.40)
HCT: 23 % — ABNORMAL LOW (ref 36.0–46.0)
HCT: 24 % — ABNORMAL LOW (ref 36.0–46.0)
Hemoglobin: 7.8 g/dL — ABNORMAL LOW (ref 12.0–15.0)
Hemoglobin: 8.2 g/dL — ABNORMAL LOW (ref 12.0–15.0)
O2 Saturation: 80 %
O2 Saturation: 95 %
Patient temperature: 97.8
Patient temperature: 36.3
Potassium: 2.9 mmol/L — ABNORMAL LOW (ref 3.5–5.1)
Potassium: 4 mmol/L (ref 3.5–5.1)
Sodium: 134 mmol/L — ABNORMAL LOW (ref 135–145)
Sodium: 133 mmol/L — ABNORMAL LOW (ref 135–145)
TCO2: 17 mmol/L — ABNORMAL LOW (ref 22–32)
TCO2: 18 mmol/L — ABNORMAL LOW (ref 22–32)
pCO2 arterial: 28.4 mmHg — ABNORMAL LOW (ref 32–48)
pCO2 arterial: 59.3 mmHg — ABNORMAL HIGH (ref 32–48)
pH, Arterial: 7.365 (ref 7.35–7.45)
pH, Arterial: 7.037 — CL (ref 7.35–7.45)
pO2, Arterial: 44 mmHg — ABNORMAL LOW (ref 83–108)
pO2, Arterial: 107 mmHg (ref 83–108)

## 2022-02-24 LAB — BODY FLUID CELL COUNT WITH DIFFERENTIAL
Eos, Fluid: 0 %
Lymphs, Fluid: 81 %
Monocyte-Macrophage-Serous Fluid: 9 % — ABNORMAL LOW (ref 50–90)
Neutrophil Count, Fluid: 10 % (ref 0–25)
Total Nucleated Cell Count, Fluid: 227 cu mm (ref 0–1000)

## 2022-02-24 LAB — CBC WITH DIFFERENTIAL/PLATELET
HCT: 21.1 % — ABNORMAL LOW (ref 36.0–46.0)
Hemoglobin: 7.7 g/dL — ABNORMAL LOW (ref 12.0–15.0)
MCH: 31 pg (ref 26.0–34.0)
MCHC: 36.5 g/dL — ABNORMAL HIGH (ref 30.0–36.0)
MCV: 85.1 fL (ref 80.0–100.0)
Platelets: 9 10*3/uL — CL (ref 150–400)
RBC: 2.48 MIL/uL — ABNORMAL LOW (ref 3.87–5.11)
RDW: 18.7 % — ABNORMAL HIGH (ref 11.5–15.5)
Smear Review: DECREASED
WBC: <0.1 10*3/uL — CL (ref 4.0–10.5)

## 2022-02-24 LAB — LACTIC ACID, PLASMA
Lactic Acid, Venous: 1.6 mmol/L (ref 0.5–1.9)
Lactic Acid, Venous: 1.9 mmol/L (ref 0.5–1.9)

## 2022-02-24 LAB — CULTURE, BLOOD (ROUTINE X 2)
Culture: NO GROWTH
Culture: NO GROWTH
Special Requests: ADEQUATE
Special Requests: ADEQUATE

## 2022-02-24 LAB — BPAM PLATELET PHERESIS
Blood Product Expiration Date: 202312242359
ISSUE DATE / TIME: 202312221103
Unit Type and Rh: 6200

## 2022-02-24 LAB — BLOOD GAS, VENOUS
Acid-base deficit: 6.2 mmol/L — ABNORMAL HIGH (ref 0.0–2.0)
Bicarbonate: 19 mmol/L — ABNORMAL LOW (ref 20.0–28.0)
O2 Saturation: 69.5 %
Patient temperature: 37
pCO2, Ven: 36 mmHg — ABNORMAL LOW (ref 44–60)
pH, Ven: 7.33 (ref 7.25–7.43)
pO2, Ven: 41 mmHg (ref 32–45)

## 2022-02-24 LAB — MAGNESIUM: Magnesium: 2.3 mg/dL (ref 1.7–2.4)

## 2022-02-24 LAB — PREPARE PLATELET PHERESIS: Unit division: 0

## 2022-02-24 LAB — GLUCOSE, CAPILLARY
Glucose-Capillary: 105 mg/dL — ABNORMAL HIGH (ref 70–99)
Glucose-Capillary: 120 mg/dL — ABNORMAL HIGH (ref 70–99)
Glucose-Capillary: 138 mg/dL — ABNORMAL HIGH (ref 70–99)

## 2022-02-24 LAB — DIGOXIN LEVEL: Digoxin Level: 1.5 ng/mL (ref 0.8–2.0)

## 2022-02-24 MED ORDER — POLYETHYLENE GLYCOL 3350 17 G PO PACK
17.0000 g | PACK | Freq: Every day | ORAL | Status: DC
  Administered 2022-02-24: 17 g
  Filled 2022-02-24: qty 1

## 2022-02-24 MED ORDER — PIPERACILLIN-TAZOBACTAM 3.375 G IVPB 30 MIN
3.3750 g | Freq: Once | INTRAVENOUS | Status: AC
  Administered 2022-02-24: 3.375 g via INTRAVENOUS
  Filled 2022-02-24 (×2): qty 50

## 2022-02-24 MED ORDER — NOREPINEPHRINE 4 MG/250ML-% IV SOLN
INTRAVENOUS | Status: AC
  Filled 2022-02-24: qty 250

## 2022-02-24 MED ORDER — FENTANYL CITRATE PF 50 MCG/ML IJ SOSY
PREFILLED_SYRINGE | INTRAMUSCULAR | Status: AC
  Filled 2022-02-24: qty 2

## 2022-02-24 MED ORDER — NOREPINEPHRINE 16 MG/250ML-% IV SOLN
0.0000 ug/min | INTRAVENOUS | Status: DC
  Administered 2022-02-24: 50 ug/min via INTRAVENOUS
  Filled 2022-02-24: qty 250

## 2022-02-24 MED ORDER — ROCURONIUM BROMIDE 50 MG/5ML IV SOLN
100.0000 mg | Freq: Once | INTRAVENOUS | Status: AC
  Administered 2022-02-24: 100 mg via INTRAVENOUS
  Filled 2022-02-24: qty 10

## 2022-02-24 MED ORDER — MAGNESIUM SULFATE 2 GM/50ML IV SOLN
2.0000 g | Freq: Once | INTRAVENOUS | Status: AC
  Administered 2022-02-24: 2 g via INTRAVENOUS
  Filled 2022-02-24: qty 50

## 2022-02-24 MED ORDER — ORAL CARE MOUTH RINSE: 15.0000 mL | OROMUCOSAL | Status: DC | PRN

## 2022-02-24 MED ORDER — POLYVINYL ALCOHOL 1.4 % OP SOLN: 1.0000 [drp] | Freq: Four times a day (QID) | OPHTHALMIC | Status: DC | PRN

## 2022-02-24 MED ORDER — HALOPERIDOL LACTATE 5 MG/ML IJ SOLN: 2.5000 mg | INTRAMUSCULAR | Status: DC | PRN

## 2022-02-24 MED ORDER — KETAMINE HCL 50 MG/5ML IJ SOSY
PREFILLED_SYRINGE | INTRAMUSCULAR | Status: AC
  Filled 2022-02-24: qty 15

## 2022-02-24 MED ORDER — FENTANYL CITRATE PF 50 MCG/ML IJ SOSY: 25.0000 ug | PREFILLED_SYRINGE | Freq: Once | INTRAMUSCULAR | Status: DC

## 2022-02-24 MED ORDER — GLYCOPYRROLATE 0.2 MG/ML IJ SOLN: 0.2000 mg | INTRAMUSCULAR | Status: DC | PRN

## 2022-02-24 MED ORDER — PANTOPRAZOLE SODIUM 40 MG IV SOLR: 40.0000 mg | Freq: Every day | INTRAVENOUS | Status: DC

## 2022-02-24 MED ORDER — PRAVASTATIN SODIUM 40 MG PO TABS: 40.0000 mg | ORAL_TABLET | Freq: Every day | ORAL | Status: DC

## 2022-02-24 MED ORDER — FENTANYL CITRATE PF 50 MCG/ML IJ SOSY
100.0000 ug | PREFILLED_SYRINGE | Freq: Once | INTRAMUSCULAR | Status: AC
  Administered 2022-02-24: 100 ug via INTRAVENOUS

## 2022-02-24 MED ORDER — VASOPRESSIN 20 UNITS/100 ML INFUSION FOR SHOCK
0.0000 [IU]/min | INTRAVENOUS | Status: DC
  Administered 2022-02-24: 0.03 [IU]/min via INTRAVENOUS
  Filled 2022-02-24: qty 100

## 2022-02-24 MED ORDER — SODIUM CHLORIDE 0.9 % IV SOLN: INTRAVENOUS | Status: DC | PRN

## 2022-02-24 MED ORDER — SODIUM CHLORIDE 0.9% IV SOLUTION: Freq: Once | INTRAVENOUS | Status: DC

## 2022-02-24 MED ORDER — SODIUM CHLORIDE 0.9 % IV SOLN
100.0000 mg | INTRAVENOUS | Status: DC
  Administered 2022-02-24: 100 mg via INTRAVENOUS
  Filled 2022-02-24: qty 5

## 2022-02-24 MED ORDER — ACETAMINOPHEN 650 MG RE SUPP: 650.0000 mg | Freq: Four times a day (QID) | RECTAL | Status: DC | PRN

## 2022-02-24 MED ORDER — FENTANYL BOLUS VIA INFUSION: 25.0000 ug | INTRAVENOUS | Status: DC | PRN

## 2022-02-24 MED ORDER — ACETAMINOPHEN 325 MG PO TABS: 650.0000 mg | ORAL_TABLET | Freq: Four times a day (QID) | ORAL | Status: DC | PRN

## 2022-02-24 MED ORDER — MIDAZOLAM HCL 2 MG/2ML IJ SOLN: 1.0000 mg | INTRAMUSCULAR | Status: DC | PRN

## 2022-02-24 MED ORDER — ORAL CARE MOUTH RINSE: 15.0000 mL | OROMUCOSAL | Status: DC

## 2022-02-24 MED ORDER — SENNOSIDES-DOCUSATE SODIUM 8.6-50 MG PO TABS: 1.0000 | ORAL_TABLET | Freq: Two times a day (BID) | ORAL | Status: DC

## 2022-02-24 MED ORDER — SODIUM BICARBONATE 8.4 % IV SOLN
100.0000 meq | Freq: Once | INTRAVENOUS | Status: AC
  Administered 2022-02-24: 100 meq via INTRAVENOUS

## 2022-02-24 MED ORDER — FAMOTIDINE 20 MG PO TABS: 20.0000 mg | ORAL_TABLET | Freq: Every day | ORAL | Status: DC

## 2022-02-24 MED ORDER — EPINEPHRINE HCL 5 MG/250ML IV SOLN IN NS
0.5000 ug/min | INTRAVENOUS | Status: DC
  Administered 2022-02-24: 1 ug/min via INTRAVENOUS
  Filled 2022-02-24: qty 250

## 2022-02-24 MED ORDER — DOCUSATE SODIUM 50 MG/5ML PO LIQD
100.0000 mg | Freq: Two times a day (BID) | ORAL | Status: DC
  Administered 2022-02-24: 100 mg
  Filled 2022-02-24: qty 10

## 2022-02-24 MED ORDER — FENTANYL 2500MCG IN NS 250ML (10MCG/ML) PREMIX INFUSION
25.0000 ug/h | INTRAVENOUS | Status: DC
  Administered 2022-02-24: 50 ug/h via INTRAVENOUS
  Filled 2022-02-24: qty 250

## 2022-02-24 MED ORDER — PHENYLEPHRINE CONCENTRATED 100MG/250ML (0.4 MG/ML) INFUSION SIMPLE
0.0000 ug/min | INTRAVENOUS | Status: DC
  Administered 2022-02-24: 150 ug/min via INTRAVENOUS
  Filled 2022-02-24: qty 250

## 2022-02-24 MED ORDER — VITAL HIGH PROTEIN PO LIQD: 1000.0000 mL | ORAL | Status: DC

## 2022-02-24 MED ORDER — PROSOURCE TF20 ENFIT COMPATIBL EN LIQD
60.0000 mL | Freq: Every day | ENTERAL | Status: DC
  Administered 2022-02-24: 60 mL
  Filled 2022-02-24: qty 60

## 2022-02-24 MED ORDER — GLYCOPYRROLATE 1 MG PO TABS: 1.0000 mg | ORAL_TABLET | ORAL | Status: DC | PRN

## 2022-02-24 MED ORDER — HYDROCORTISONE SOD SUC (PF) 100 MG IJ SOLR
100.0000 mg | Freq: Two times a day (BID) | INTRAMUSCULAR | Status: DC
  Administered 2022-02-24: 100 mg via INTRAVENOUS
  Filled 2022-02-24: qty 2

## 2022-02-24 MED ORDER — ONDANSETRON 4 MG PO TBDP: 4.0000 mg | ORAL_TABLET | Freq: Four times a day (QID) | ORAL | Status: DC | PRN

## 2022-02-24 MED ORDER — PHENYLEPHRINE HCL-NACL 20-0.9 MG/250ML-% IV SOLN
0.0000 ug/min | INTRAVENOUS | Status: DC
  Administered 2022-02-24: 20 ug/min via INTRAVENOUS
  Administered 2022-02-24: 130 ug/min via INTRAVENOUS
  Filled 2022-02-24 (×2): qty 250

## 2022-02-24 MED ORDER — ONDANSETRON HCL 4 MG/2ML IJ SOLN: 4.0000 mg | Freq: Four times a day (QID) | INTRAMUSCULAR | Status: DC | PRN

## 2022-02-24 MED ORDER — NOREPINEPHRINE 4 MG/250ML-% IV SOLN
0.0000 ug/min | INTRAVENOUS | Status: DC
  Administered 2022-02-24: 4 ug/min via INTRAVENOUS

## 2022-02-24 MED ORDER — PIPERACILLIN-TAZOBACTAM 3.375 G IVPB: 3.3750 g | Freq: Three times a day (TID) | INTRAVENOUS | Status: DC

## 2022-02-24 MED ORDER — KETAMINE HCL 50 MG/5ML IJ SOSY
1.0000 mg/kg | PREFILLED_SYRINGE | Freq: Once | INTRAMUSCULAR | Status: AC
  Administered 2022-02-24: 122 mg via INTRAVENOUS

## 2022-02-24 MED ORDER — SODIUM BICARBONATE 8.4 % IV SOLN
INTRAVENOUS | Status: AC
  Filled 2022-02-24: qty 100

## 2022-02-24 MED ORDER — POTASSIUM CHLORIDE 10 MEQ/50ML IV SOLN
10.0000 meq | INTRAVENOUS | Status: AC
  Administered 2022-02-24 (×4): 10 meq via INTRAVENOUS
  Filled 2022-02-24 (×5): qty 50

## 2022-02-25 LAB — BPAM PLATELET PHERESIS
Blood Product Expiration Date: 202312252359
ISSUE DATE / TIME: 202312230548
Unit Type and Rh: 6200

## 2022-02-25 LAB — PREPARE PLATELET PHERESIS: Unit division: 0

## 2022-02-25 NOTE — Progress Notes (Signed)
     Consult received. Chart reviewed. Discussed with Dr. Carlis Abbott via secure chat. Patient is in multiorgan failure and maxed on 4 pressors.  Family understands she is not likely to survive.   No urgent PMT needs at this time. Will follow-up tomorrow.     Elie Confer, NP-C Palliative Medicine   Please call Palliative Medicine team phone with any urgent needs [phone]. For individual providers please see AMION.   No charge

## 2022-02-28 ENCOUNTER — Ambulatory Visit: Payer: PPO | Admitting: Hematology

## 2022-02-28 ENCOUNTER — Other Ambulatory Visit: Payer: PPO

## 2022-02-28 LAB — CULTURE, RESPIRATORY W GRAM STAIN: Gram Stain: NONE SEEN

## 2022-03-02 LAB — ACID FAST SMEAR (AFB, MYCOBACTERIA): Acid Fast Smear: NEGATIVE

## 2022-03-05 NOTE — Progress Notes (Signed)
Pharmacy Antibiotic Note  Kaitlyn Hull is a 67 y.o. female  with pneumonia.  Pharmacy has been consulted for zosyn dosing. SHe was on ancef for MSSA bacteremia -WBC < 0.1, SCr= 1.54, CrCL ~ 45  Plan: -Zosyn 3.375gm IV q8h -Will follow renal function, cultures and clinical progress   Height: '5\' 3"'$  (160 cm) (per family) Weight: 122.1 kg (269 lb 2.9 oz) IBW/kg (Calculated) : 52.4  Temp (24hrs), Avg:97.2 F (36.2 C), Min:95.2 F (35.1 C), Max:100.5 F (38.1 C)  Recent Labs  Lab 02/21/22 1020 02/21/22 1950 02/22/22 0234 02/22/22 1926 02/23/22 0545 02/23/22 1537 March 18, 2022 0242 03/18/2022 0243 Mar 18, 2022 0750  WBC  --    < > 0.2* <0.1* 0.1* 0.1* <0.1*  --   --   CREATININE 1.02*  --  1.15* 1.30* 1.35*  --  1.54*  --   --   LATICACIDVEN  --   --   --   --   --   --   --  1.6 1.9   < > = values in this interval not displayed.    Estimated Creatinine Clearance: 46.2 mL/min (A) (by C-G formula based on SCr of 1.54 mg/dL (H)).    No Known Allergies    Thank you for allowing pharmacy to be a part of this patient's care.  Hildred Laser, PharmD Clinical Pharmacist **Pharmacist phone directory can now be found on [email or web address] ([email or web address]).  Listed under [place].

## 2022-03-05 NOTE — Progress Notes (Signed)
RT called to bedside to assess pt in mild distress. Pt alert and oriented but complains of SOB. RN Mindy suggest to place pt on HFNC to decrease pt increase WOB. Placed pt on HFNC 20L/60% to initiate. Pt tolerating and is resting comfortably. No immediate distress noted but will monitor for any clinical chances

## 2022-03-05 NOTE — Procedures (Signed)
Intubation Procedure Note  IVORIE UPLINGER  [id number]  Apr 30, 1955  Date:02/25/2022  Time:12:12 PM   Provider Performing:Noya Santarelli P Carlis Abbott    Procedure: Intubation (31500)  Indication(s) Respiratory Failure  Consent Risks of the procedure as well as the alternatives and risks of each were explained to the patient and/or caregiver.  Consent for the procedure was obtained and is signed in the bedside chart   Anesthesia Fentanyl, Rocuronium, and ketamine   Time Out Verified patient identification, verified procedure, site/side was marked, verified correct patient position, special equipment/implants available, medications/allergies/relevant history reviewed, required imaging and test results available.   Sterile Technique Usual hand hygeine, masks, and gloves were used   Procedure Description Patient positioned in bed supine.  Sedation given as noted above.  Patient was intubated with endotracheal tube using Glidescope.  View was Grade 1 full glottis .  Number of attempts was 1.  Colorimetric CO2 detector was consistent with tracheal placement. On initial placement of the laryngosope, she had dark bloody secretions in the posterior pharynx. There were not removed because they did not obstruct the view of her cords. She had a grade 1 view. No active bleeding was seen. OGT was placed under direct visualization to prevent additional trauma to her upper airway that could contribute to additional bleeding.    Complications/Tolerance None; patient tolerated the procedure well. Chest X-ray is ordered to verify placement.   EBL No new bleeding/ active bleeding   Specimen(s) None  Julian Hy, DO Feb 25, 2022 12:20 PM [facility]

## 2022-03-05 NOTE — Progress Notes (Addendum)
NAME:  Kaitlyn Hull, MRN:  [id number], DOB:  Apr 19, 1955, LOS: 7 ADMISSION DATE:  03/03/2022, CONSULTATION DATE: 02/20/2022 REFERRING MD: Triad, CHIEF COMPLAINT: Questionable thoracentesis for fluid evaluation of aspergillosis.  History of Present Illness:  67 year old female with past medical history as below, which is significant for AF, DM, HTN, and MAL currently on chemotherapy. She was admitted to [facility] 12/16 with a 2 day history of weakness, poor PO intake and falls. Workup discovered MSSA bacteremia in the setting of parotitis and multifocal pneumonia. 12/21 she became acutely short of breath accompanied by tachycardia. PCCM was consulted.  Stabilized with BiPAP Transferred back to the floor but overnight started struggling again Seen by CCM team during the night, remains on high flow but feeling tired Rapid response called to see patient Patient will be transferred to the ICU  Pertinent  Medical History   has a past medical history of Atrial fibrillation with RVR ([place]) (06/2013), Breast cancer ([place]) (2007), Breast disorder, Diabetes mellitus, Dysrhythmia, Hematuria (01/20/2014), Hypertension, Hyperthyroidism (06/2013), Leukemia ([place]), Obesity, PMB (postmenopausal bleeding) (07/24/2012), Psoriasis, and Urinary frequency (01/20/2014).   Significant Hospital Events: Including procedures, antibiotic start and stop dates in addition to other pertinent events   12/16 admit 12/21 tx to ICU for BiPAP 12/23-hypotension, feeling tired-transferred to ICU  Interim History / Subjective:  Continues to struggle with her breathing A-fib with RVR-on amiodarone drip Worsening chest x-ray findings On high flow oxygen Hypotension  Objective   Blood pressure (!) 71/48, pulse 67, temperature (!) 96.9 F (36.1 C), temperature source Axillary, resp. rate (!) 36, height '5\' 2"'$  (1.575 m), weight 122.1 kg, last menstrual period 11/17/2014, SpO2 95 %.    FiO2 (%):  [40 %-90 %] 90 %    Intake/Output Summary (Last 24 hours) at 10-Mar-2022 0942 Last data filed at 03-10-2022 0600 Gross per 24 hour  Intake 1416.08 ml  Output 700 ml  Net 716.08 ml   Filed Weights   03/03/2022 0842 02/02/2022 2356 02/23/22 0424  Weight: 117 kg 112.8 kg 122.1 kg    Examination: General: Obese, increased work of breathing  HENT: Dry oral mucosa Lungs: Coarse breath sounds bilaterally, decreased air movement Cardiovascular: Irregularly irregular, Abdomen: Soft, bowel sounds appreciated Extremities: Bilateral lower extremity edema Neuro: Alert, very weak  12/16 BC MSSA 12/18 BC neg 12/17 RVP negative  Chest x-ray with pulmonary congestion bilaterally, bilateral infiltrates  ABG noted  Resolved Hospital Problem list     Assessment & Plan:   Hypoxemic respiratory failure Decompensated diastolic heart failure -Unable to tolerate further diuresis with hypotension -Will transfer to ICU -Will benefit from being on a ventilator -ABG noted -Chest x-ray reviewed by myself  MSSA bacteremia Neutropenic fever Multifocal pneumonia Parotitis -Continue cefazolin -Infectious disease following -Fungal workup-pending results -Unable to obtain TEE due to thrombocytopenia  Permanent atrial fibrillation with RVR -Had to be started on amiodarone currently at 60 mg  Acute on chronic heart failure with preserved ejection fraction -Cautious diuresis however not able to tolerate diuresis at present secondary to hypotension -Will start Neo-Synephrine  Acute kidney injury -Maintain renal perfusion -Avoid nephrotoxics  Electrolyte derangement, hypokalemia -Replete electrolytes  AML Pancytopenia - Receiving venetoclax/decitabine (last cycle started 11/27).  Currently holding venetoclax. - S/p 9 units platelets (12/16, 12/17, 12/18x2, 12/19x2, 12/21, 1222, 12/23) - S/p 3u PRBCs (12/17x2, 12/20, 12/22) - Trend CBC - palliative care has been consulted - platelet goal >20K, transfuse 1  unit platelets today   Diabetes -Continue SSI  Patient does  not want to be on a BiPAP, not able to tolerate it Agreeable to intubation if needed  Best Practice (right click and "Reselect all SmartList Selections" daily)   Diet/type: NPO DVT prophylaxis: not indicated GI prophylaxis: PPI Lines: N/A Foley:  N/A Code Status:  full code Last date of multidisciplinary goals of care discussion [12/21 Full code]  Labs   CBC: Recent Labs  Lab 02/18/22 0430 02/18/22 1035 02/22/22 0234 02/22/22 1926 02/22/22 2355 02/23/22 0545 02/23/22 1537 March 10, 2022 0242  WBC DUPL   < > 0.2* <0.1*  --  0.1* 0.1* <0.1*  NEUTROABS PENDING  --  0.0*  --   --   --  0.0*  --   HGB DUPL   < > 8.8* 9.3* 10.2* 9.1* 10.0* 7.7*  HCT DUPL   < > 23.6* 26.3* 30.0* 25.0* 27.0* 21.1*  MCV DUPL   < > 84.0 86.2  --  85.6 83.9 85.1  PLT DUPL   < > 13* 17*  --  12* 18* 9*   < > = values in this interval not displayed.    Basic Metabolic Panel: Recent Labs  Lab 02/19/22 0347 02/20/22 0211 02/21/22 1020 02/22/22 0234 02/22/22 1926 02/22/22 2355 02/23/22 0545 Mar 10, 2022 0242  NA 130* 134* 137 135 136 136 137 134*  K 3.2* 3.9 2.8* 3.6 4.2 4.0 3.7 2.6*  CL 101 106 109 106 105  --  104 103  CO2 20* 17* 19* 20* 20*  --  21* 19*  GLUCOSE 108* 116* 157* 103* 109*  --  136* 221*  BUN '21 19 17 19 '$ 24*  --  27* 35*  CREATININE 1.30* 1.20* 1.02* 1.15* 1.30*  --  1.35* 1.54*  CALCIUM 7.6* 7.9* 7.5* 8.1* 8.9  --  8.7* 7.8*  MG 1.6* 2.0  --  1.8 1.9  --  1.9 2.3  PHOS 3.1 2.3*  --   --  3.8  --   --   --    GFR: Estimated Creatinine Clearance: 45.4 mL/min (A) (by C-G formula based on SCr of 1.54 mg/dL (H)). Recent Labs  Lab 02/16/2022 1040 02/18/22 0430 02/22/22 1926 02/23/22 0545 02/23/22 1537 Mar 10, 2022 0242 Mar 10, 2022 0243 03-10-2022 0750  WBC  --    < > <0.1* 0.1* 0.1* <0.1*  --   --   LATICACIDVEN 2.0*  --   --   --   --   --  1.6 1.9   < > = values in this interval not displayed.    Liver Function  Tests: Recent Labs  Lab 02/19/22 0347 02/20/22 0211 02/22/22 1926  AST 31 24 13*  ALT 23 13 <5  ALKPHOS 40 49 71  BILITOT 1.8* 2.2* 1.3*  PROT 5.4* 5.2* 5.6*  ALBUMIN 2.1* 1.9* 1.7*   No results for input(s): "LIPASE", "AMYLASE" in the last 168 hours. No results for input(s): "AMMONIA" in the last 168 hours.  ABG    Component Value Date/Time   PHART 7.378 02/22/2022 2355   PCO2ART 32.8 02/22/2022 2355   PO2ART 157 (H) 02/22/2022 2355   HCO3 19.0 (L) 03-10-22 0205   TCO2 20 (L) 02/22/2022 2355   ACIDBASEDEF 6.2 (H) March 10, 2022 0205   O2SAT 69.5 10-Mar-2022 0205     Coagulation Profile: No results for input(s): "INR", "PROTIME" in the last 168 hours.  Cardiac Enzymes: No results for input(s): "CKTOTAL", "CKMB", "CKMBINDEX", "TROPONINI" in the last 168 hours.  HbA1C: Hgb A1c MFr Bld  Date/Time Value Ref Range Status  02/18/2022 10:35  AM 5.6 4.8 - 5.6 % Final    Comment:    (NOTE)         Prediabetes: 5.7 - 6.4         Diabetes: >6.4         Glycemic control for adults with diabetes: <7.0   10/26/2021 07:45 PM 6.2 (H) 4.8 - 5.6 % Final    Comment:    (NOTE)         Prediabetes: 5.7 - 6.4         Diabetes: >6.4         Glycemic control for adults with diabetes: <7.0     CBG: Recent Labs  Lab 02/23/22 0721 02/23/22 1144 02/23/22 1510 02/23/22 2103 Mar 18, 2022 0614  GLUCAP 120* 124* 113* 113* 105*    Review of Systems:   10 point review of system taken, please see HPI for positives and negatives.   Past Medical History:  She,  has a past medical history of Atrial fibrillation with RVR ([place]) (06/2013), Breast cancer ([place]) (2007), Breast disorder, Diabetes mellitus, Dysrhythmia, Hematuria (01/20/2014), Hypertension, Hyperthyroidism (06/2013), Leukemia ([place]), Obesity, PMB (postmenopausal bleeding) (07/24/2012), Psoriasis, and Urinary frequency (01/20/2014).   Surgical History:   Past Surgical History:  Procedure Laterality Date   BREAST BIOPSY Left  09/14/2020   Procedure: BREAST BIOPSY;  Surgeon: Aviva Signs, MD;  Location: [facility];  Service: General;  Laterality: Left;   BREAST SURGERY  02/20/06   left side    CESAREAN SECTION  1995   COLONOSCOPY WITH PROPOFOL N/A 10/28/2021   Procedure: COLONOSCOPY WITH PROPOFOL;  Surgeon: Daneil Dolin, MD;  Location: [facility];  Service: Endoscopy;  Laterality: N/A;   ESOPHAGOGASTRODUODENOSCOPY (EGD) WITH PROPOFOL N/A 10/28/2021   Procedure: ESOPHAGOGASTRODUODENOSCOPY (EGD) WITH PROPOFOL;  Surgeon: Daneil Dolin, MD;  Location[facility];  Service: Endoscopy;  Laterality: N/A;   HYSTEROSCOPY WITH D & C N/A 08/13/2012   Procedure: DILATATION AND CURETTAGE /HYSTEROSCOPY;  Surgeon: Florian Buff, MD;  Location: [facility];  Service: Gynecology;  Laterality: N/A;   SECONDARY CLOSURE OF WOUND Left 02/20/2021   Procedure: SIMPLE WOUND CLOSURE;  Surgeon: Aviva Signs, MD;  Location: [facility];  Service: General;  Laterality: Left;     Social History:   reports that she quit smoking about 18 years ago. Her smoking use included cigarettes. She started smoking about 46 years ago. She has a 0.01 pack-year smoking history. She has never used smokeless tobacco. She reports that she does not drink alcohol and does not use drugs.   Family History:  Her family history includes Alzheimer's disease in her mother; Breast cancer in her sister; CAD in her father; Diabetes in her sister and sister; Heart failure in her father; Hypertension in her maternal grandmother; Other in her sister. There is no history of Colon cancer.   Allergies No Known Allergies   The patient is critically ill with multiple organ systems failure and requires high complexity decision making for assessment and support, frequent evaluation and titration of therapies, application of advanced monitoring technologies and extensive interpretation of multiple databases. Critical Care Time devoted to patient care services described in this note  independent of APP/resident time (if applicable)  is 35 minutes.   Sherrilyn Rist MD [facility] Personal pager: See Amion If unanswered, please page CCM On-call: [phone]

## 2022-03-05 NOTE — Progress Notes (Signed)
Pharmacy Antibiotic Note  Kaitlyn Hull is a 67 y.o. female admitted on 03/04/2022 with AFib RVR and found to be bacteremic with MSSA. Pt has pancytopenia due to AML. Repeat blood cultures are negative, fungal serologies pending, Cr stable.  Plan: Continue  Cefazolin  2g IV every 8 hours   Height: _0  (157.5 cm) Weight: 122.1 kg (269 lb 2.9 oz) IBW/kg (Calculated) : 50.1  Temp (24hrs), Avg:97.4 F (36.3 C), Min:95.2 F (35.1 C), Max:100.5 F (38.1 C)  Recent Labs  Lab 02/28/2022 0846 02/11/2022 1040 02/18/22 0430 02/21/22 1020 02/21/22 1950 02/22/22 0234 02/22/22 1926 02/23/22 0545 02/23/22 1537 March 22, 2022 0242 03-22-2022 0243  WBC  --   --    < >  --    < > 0.2* <0.1* 0.1* 0.1* <0.1*  --   CREATININE  --   --    < > 1.02*  --  1.15* 1.30* 1.35*  --  1.54*  --   LATICACIDVEN 2.4* 2.0*  --   --   --   --   --   --   --   --  1.6   < > = values in this interval not displayed.     Estimated Creatinine Clearance: 45.4 mL/min (A) (by C-G formula based on SCr of 1.54 mg/dL (H)).    No Known Allergies  Antimicrobials this admission: Cefepime 12/16>>12/17 Vanc 12/16>>12/17 Flagyl 12/17>> 12/17 Cefazolin: 12/17>>  Microbiology results: 12/16 BCx: MSSA 12/16 Fluid Cx: NGTD 12/18 BCx: NGTD 12/19 Blastomyces Ag, serum: none detected 12/20 Aspergillus Ag, BAL/serum: 0.04  Thank you for allowing pharmacy to be a part of this patient's care.  Titus Dubin, PharmD PGY1 Pharmacy Resident March 22, 2022 7:51 AM

## 2022-03-05 NOTE — Progress Notes (Signed)
I did call and speak with patient's daughter  Abby Hearp-updated about multiple comorbidities and her deterioration, infection that is not controlled, bone marrow not working, kidney is failing, heart struggling-likelihood of survival is poor.  She continues to deteriorate despite very aggressive measures.  I did encourage her to come to the hospital to visit and further discussions.  I did also let her know that she will get calls about how she is doing overall

## 2022-03-05 NOTE — IPAL (Addendum)
  Interdisciplinary Goals of Care Family Meeting   Date carried out:: 03/06/22  Location of the meeting: Bedside  Member's involved: Physician, Bedside Registered Nurse, and Family Member or next of kin  Durable Power of Attorney or acting medical decision maker: daughter Abby    Discussion: We discussed goals of care for [employer] .  She is maxed on 4 pressors and is on aggressive vent support with progressive MOF. Her daughter understands that she is not likely to survive this and agrees that if her heart stops we should let her pass away naturally. They understand she is likely to not survive overnight.   Code status: Full DNR  Disposition: Continue current acute care   Time spent for the meeting: 5 min.  Julian Hy Mar 06, 2022, 3:51 PM   Family wants to withdraw aggressive care and focus on comfort. Comfort orders placed. Terminal extubation  tonight.  Julian Hy, DO 2022/03/06 5:26 PM [facility]

## 2022-03-05 NOTE — Progress Notes (Signed)
Chart reviewed. Antibiotics being escalated to Zosyn to cover anaerobes and resistant GNR. Repeat RVP, BAL send for multiple cultures, PJP, fungitell (obtained before receiving zosyn).  Discussed with ID.  PICC in place for pressors--currently on levo, vaso, neo.  ABG and BAL performed.   Additional cc time 45 min.  Julian Hy, DO 03/06/22 12:28 PM [facility]

## 2022-03-05 NOTE — Progress Notes (Signed)
[facility] Progress Note Patient Name: Kaitlyn Hull DOB: 04/24/1955 MRN: [id number]   Date of Service  Mar 14, 2022  HPI/Events of Note  K+ 2.6, Mg++ 1.9, GFR 37. Platelet count 9 K.  eICU Interventions  Electrolytes replaced per modified E-Link electrolyte replacement protocol, One unit of platelets ordered transfused.        Kaitlyn Hull 03/14/22, 5:18 AM

## 2022-03-05 NOTE — Progress Notes (Signed)
Kaitlyn Hull for Infectious Disease    Date of Admission:  02/02/2022   Total days of antibiotics 7          ID: Kaitlyn Hull is a 67 y.o. female with  MSSA bacteremia with parotitis but now with new diagnosis of AML and ongoing febrile neutropenia. Remains intubated with bloody secretions on BAL Principal Problem:   Atrial fibrillation with tachycardic ventricular rate (HCC) Active Problems:   Morbid obesity due to excess calories (HCC)   Atrial fibrillation with rapid ventricular response (HCC)   Chronic atrial fibrillation (HCC)   Acute Myeloid Leukemia/Abnormal blood smear/   Neutropenia (HCC)   Acute myeloid leukemia in adult [facility])   Neutropenic fever (HCC)   Sepsis ([place])   MSSA bacteremia   Parotitis   Pulmonary nodules   Lumbar back pain   Acute hypoxic respiratory failure (HCC)   Acute diastolic heart failure (HCC)   On mechanically assisted ventilation (HCC)   Pneumonia of left lower lobe due to infectious organism   Acute respiratory failure with hypoxia (HCC)   Septic shock (HCC)    Subjective: Had isolated fever but still remains to have pancytopenia.   Medications:   sodium chloride   Intravenous Once   sodium chloride   Intravenous Once   Chlorhexidine Gluconate Cloth  6 each Topical Daily   docusate  100 mg Per Tube BID   feeding supplement (PROSource TF20)  60 mL Per Tube Daily   feeding supplement (VITAL HIGH PROTEIN)  1,000 mL Per Tube Q24H   fentaNYL       fentaNYL       fentaNYL (SUBLIMAZE) injection  25 mcg Intravenous Once   insulin aspart  0-15 Units Subcutaneous TID WC   ketamine HCl       magnesium oxide  400 mg Oral BID   multivitamin with minerals  1 tablet Oral Daily   mouth rinse  15 mL Mouth Rinse 4 times per day   pantoprazole (PROTONIX) IV  40 mg Intravenous Daily   polyethylene glycol  17 g Per Tube Daily   pravastatin  40 mg Per Tube q1800   senna-docusate  1 tablet Per Tube BID   sodium chloride flush  10-40 mL  Intracatheter Q12H    Objective: Vital signs in last 24 hours: Temp:  [95.2 F (35.1 C)-100.5 F (38.1 C)] 96.9 F (36.1 C) (12/23 0845) Pulse Rate:  [33-161] 104 (12/23 1215) Resp:  [17-41] 25 (12/23 1215) BP: (56-154)/(24-96) 67/44 (12/23 1039) SpO2:  [77 %-100 %] 99 % (12/23 1215) FiO2 (%):  [40 %-100 %] 100 % (12/23 1215)  Physical Exam  Constitutional:  oriented to person, place, and time. appears well-developed and well-nourished. No distress.  HENT: [place]/AT, PERRLA, no scleral icterus Mouth/Throat: OETT in place with bloody BAL on serial collections Skin = right arm abrasion/ecchymosis in setting of anasarca Abdominal: Soft. Bowel sounds are normal.  exhibits no distension. There is no tenderness.  Neurological: alert and oriented to person, place, and time.  Skin: Skin is warm and dry. No rash noted. No erythema.  Ext: anasarca   Lab Results Recent Labs    02/23/22 0545 02/23/22 1537 03-25-22 0242 2022-03-25 1039  WBC 0.1* 0.1* <0.1*  --   HGB 9.1* 10.0* 7.7* 7.8*  HCT 25.0* 27.0* 21.1* 23.0*  NA 137  --  134* 134*  K 3.7  --  2.6* 2.9*  CL 104  --  103  --   CO2 21*  --  19*  --   BUN 27*  --  35*  --   CREATININE 1.35*  --  1.54*  --    Liver Panel Recent Labs    02/22/22 1926  PROT 5.6*  ALBUMIN 1.7*  AST 13*  ALT <5  ALKPHOS 71  BILITOT 1.3*   Sedimentation Rate No results for input(s): "ESRSEDRATE" in the last 72 hours. C-Reactive Protein No results for input(s): "CRP" in the last 72 hours.  Microbiology: Reviewed ( 12/16 blood cx + MSSA, but 12/18 blood cx NGTD) Studies/Results: DG CHEST PORT 1 VIEW  Result Date: 24-Mar-2022 CLINICAL DATA:  Intubation.  Follow-up exam. EXAM: PORTABLE CHEST 1 VIEW COMPARISON:  March 24, 2022 at 9:11 a.m. and older exams. FINDINGS: Endotracheal tube tip projects 2.6 cm above the carina. Nasal/orogastric tube passes well below the diaphragm into the stomach. Persistent bilateral patchy lung opacities are similar in  the right mid lung, less apparent on the left following intubation. No new lung abnormalities. Stable right PICC. No pneumothorax. IMPRESSION: 1. Well-positioned endotracheal tube and nasal/orogastric tube. Electronically Signed   By: Lajean Manes M.D.   On: 03-24-22 12:40   DG Chest Port 1 View  Result Date: 03/24/22 CLINICAL DATA:  Acute respiratory distress EXAM: PORTABLE CHEST 1 VIEW COMPARISON:  February 23, 2022 FINDINGS: A right PICC line is stable terminating near the caval atrial junction. Continued cardiomegaly. The hila and mediastinum are unchanged. No pneumothorax. Bilateral pulmonary infiltrates are patchy in appearance and significantly worsened in the interval. No other interval changes. IMPRESSION: Significant worsening of bilateral patchy pulmonary infiltrates worrisome for pneumonia. Recommend clinical correlation and follow-up to resolution. Electronically Signed   By: Dorise Bullion III M.D.   On: 03-24-2022 09:43   DG CHEST PORT 1 VIEW  Result Date: 02/23/2022 CLINICAL DATA:  Dyspnea EXAM: PORTABLE CHEST 1 VIEW COMPARISON:  02/22/2022 FINDINGS: Right-sided PICC is noted in the right atrium stable from the previous day. Cardiac shadow is mildly enlarged but stable. Central vascular congestion is noted as well as patchy opacity in the right upper lobe stable from the prior exam. No bony abnormality is noted. IMPRESSION: Stable appearance of the chest when compared with the previous day. Electronically Signed   By: Inez Catalina M.D.   On: 02/23/2022 22:32   DG CHEST PORT 1 VIEW  Result Date: 02/22/2022 CLINICAL DATA:  Respiratory failure EXAM: PORTABLE CHEST 1 VIEW COMPARISON:  02/19/2022 FINDINGS: Cardiac silhouette is prominent. Increasing consolidation right mid lung consistent with pneumonia. Pulmonary vascular congestion likely pulmonary edema. Small left-sided pleural effusion. Right PICC tip distal SVC. IMPRESSION: Findings suggest CHF with possible superimposed  pneumonia on the right. Electronically Signed   By: Sammie Bench M.D.   On: 02/22/2022 19:09   MR LUMBAR SPINE WO CONTRAST  Result Date: 02/22/2022 CLINICAL DATA:  Initial evaluation for low back pain, infection suspected, bacteremia. EXAM: MRI LUMBAR SPINE WITHOUT CONTRAST TECHNIQUE: Multiplanar, multisequence MR imaging of the lumbar spine was performed. No intravenous contrast was administered. COMPARISON:  None Available. FINDINGS: Segmentation: Standard. Lowest well-formed disc space labeled the L5-S1 level. Alignment: Straightening of the normal lumbar lordosis with slight focal kyphotic angulation at the T12-L1 level. No significant listhesis. Vertebrae: Mild chronic height loss present at the superior endplate of L1. Vertebral body height otherwise maintained with no acute or recent fracture. Bone marrow signal intensity within normal limits. No worrisome osseous lesions. Mild reactive endplate changes about the L5-S1 interspace favored to be degenerative in nature. No other abnormal marrow edema.  No evidence for osteomyelitis discitis or septic arthritis. Conus medullaris and cauda equina: Conus extends to the L1 level. Conus and cauda equina appear normal. No epidural collections. Paraspinal and other soft tissues: Mild diffuse edema within the posterior paraspinous soft tissues, favored to be related to overall volume status. No collections or significant inflammatory changes. Visualized visceral structures unremarkable. Disc levels: T12-L1: Broad-based right subarticular to foraminal disc protrusion mildly flattens the right ventral thecal sac. Mild facet hypertrophy. Resultant mild narrowing of the right lateral recess. Central canal remains patent. No foraminal stenosis. L1-2:  Negative interspace.  Mild facet hypertrophy.  No stenosis. L2-3: Negative interspace. Mild bilateral facet hypertrophy. No stenosis. L3-4: Mild diffuse disc bulge with disc desiccation. Associated reactive endplate  spurring. Mild to moderate facet and ligament flavum hypertrophy with associated small joint effusions. Resultant mild-to-moderate canal with bilateral subarticular stenosis. Foramina remain patent. L4-5: Diffuse disc bulge with disc desiccation. Mild reactive endplate spurring, worse on the left. Mild to moderate facet and ligament flavum hypertrophy with associated trace joint effusion on the right. Resultant moderate canal with bilateral subarticular stenosis. Mild bilateral L4 foraminal narrowing. L5-S1: Mild disc bulge with superimposed tiny central disc protrusion. Mild to moderate facet hypertrophy. Resultant mild narrowing of the lateral recesses bilaterally. Central canal remains patent. No significant foraminal stenosis. IMPRESSION: 1. No MRI evidence for acute infection within the lumbar spine. 2. Multifactorial degenerative changes at L3-4 and L4-5 with resultant mild to moderate canal and bilateral subarticular stenosis, with mild bilateral L4 foraminal narrowing. 3. Small disc protrusions at T12-L1 and L5-S1 without significant stenosis or overt neural impingement as above. Electronically Signed   By: Jeannine Boga M.D.   On: 02/22/2022 18:19   [place] EKG SITE RITE  Result Date: 02/22/2022 If Site Rite image not attached, placement could not be confirmed due to current cardiac rhythm.    Assessment/Plan: Pneumonia with respiratory distress, remains intubated in immunocompromise host = recommend to resend RVP, send BAL for aerobic, anaerobic culture, fungal and afb culture. Also recommend b-d glucan of serum and BAL plus pjp antigen and dfa staining. Will empirically start micafungin. Patient will also switch to piptazo for anaerobic coverage. Unclear if BAL bloody secretions from adenovirus vs. Thrombocytopenia.  Mssa bacteremia with parotitis = continue to have coverage with piptazo  Pancytopenia = as result of AML. Has likely poor prognosis given multiple infection and ventilatory  needs at present. Defer to onc. With threshold for transfusion.  [facility] for Infectious Diseases Pager: [phone]  23-Mar-2022, 1:01 PM

## 2022-03-05 NOTE — Progress Notes (Addendum)
Maxed on 3 pressors. Stress dose steroids added. Adding epinephrine gtt.  Julian Hy, DO 09-Mar-2022 2:22 PM [facility]   Vent adjusted based on ABG Now on 8cc/kg, RR increased to 35. 2 amps of bicarb. Now maxed on 4 pressors in [place] still with MAP <65. Will revisit code status with family. This is refractory shock that is potentially not survivable.  Julian Hy, DO Mar 09, 2022 3:39 PM [facility]

## 2022-03-05 NOTE — Death Summary Note (Signed)
DEATH SUMMARY   Patient Details  Name: Kaitlyn Hull MRN: [id number] DOB: 02/03/1956  Admission/Discharge Information   Admit Date:  03/17/2022  Date of Death: Date of Death: 2022/03/24  Time of Death: Time of Death: 05-12-1734  Length of Stay: 7  Referring Physician: Ledell Noss, [facility]   Reason(s) for Hospitalization  sepsis  Diagnoses  Preliminary cause of death:  Secondary Diagnoses (including complications and co-morbidities):  Principal Problem:   Atrial fibrillation with tachycardic ventricular rate ([place]) Active Problems:   Morbid obesity due to excess calories (HCC)   Atrial fibrillation with rapid ventricular response (HCC)   Chronic atrial fibrillation (HCC)   Acute Myeloid Leukemia/Abnormal blood smear/   Neutropenia (HCC)   Acute myeloid leukemia in adult [facility])   Neutropenic fever (HCC)   Sepsis (HCC)   MSSA bacteremia   Parotitis   Pulmonary nodules   Lumbar back pain   Acute hypoxic respiratory failure (HCC)   Acute diastolic heart failure (HCC)   On mechanically assisted ventilation (HCC)   Pneumonia of left lower lobe due to infectious organism   Acute respiratory failure with hypoxia (HCC)   Septic shock (HCC) AKI Hypokalemia Pancytopenia Neutropenia diabetes  Brief Hospital Course (including significant findings, care, treatment, and services provided and events leading to death)  Kaitlyn Hull is a 67 y.o. year old female with past medical history as below, which is significant for AF, DM, HTN, and MAL currently on chemotherapy. She was admitted to [facility] 18-Mar-2023 with a 2 day history of weakness, poor PO intake and falls. Workup discovered MSSA bacteremia in the setting of parotitis and multifocal pneumonia. 12/21 she became acutely short of breath accompanied by tachycardia. PCCM was consulted.  Stabilized with BiPAP Transferred back to the floor but overnight started struggling again Seen by CCM team during the night, remains on high flow but  feeling tired Rapid response called to see patient Patient will be transferred to the ICU  Despite escalation and aggressive care, she developed refractory shock on 4 vasopressors with worsening acidosis. Her family elected to withdraw aggressive care and she passed away with family at bedside .  Pertinent Labs and Studies  Significant Diagnostic Studies DG CHEST PORT 1 VIEW  Result Date: Mar 24, 2022 CLINICAL DATA:  Intubation.  Follow-up exam. EXAM: PORTABLE CHEST 1 VIEW COMPARISON:  24-Mar-2022 at 9:11 a.m. and older exams. FINDINGS: Endotracheal tube tip projects 2.6 cm above the carina. Nasal/orogastric tube passes well below the diaphragm into the stomach. Persistent bilateral patchy lung opacities are similar in the right mid lung, less apparent on the left following intubation. No new lung abnormalities. Stable right PICC. No pneumothorax. IMPRESSION: 1. Well-positioned endotracheal tube and nasal/orogastric tube. Electronically Signed   By: Lajean Manes M.D.   On: 03-24-2022 12:40   DG Chest Port 1 View  Result Date: March 24, 2022 CLINICAL DATA:  Acute respiratory distress EXAM: PORTABLE CHEST 1 VIEW COMPARISON:  February 23, 2022 FINDINGS: A right PICC line is stable terminating near the caval atrial junction. Continued cardiomegaly. The hila and mediastinum are unchanged. No pneumothorax. Bilateral pulmonary infiltrates are patchy in appearance and significantly worsened in the interval. No other interval changes. IMPRESSION: Significant worsening of bilateral patchy pulmonary infiltrates worrisome for pneumonia. Recommend clinical correlation and follow-up to resolution. Electronically Signed   By: Dorise Bullion III M.D.   On: 2022-03-24 09:43   DG CHEST PORT 1 VIEW  Result Date: 02/23/2022 CLINICAL DATA:  Dyspnea EXAM: PORTABLE CHEST 1 VIEW COMPARISON:  02/22/2022 FINDINGS: Right-sided PICC is noted in the right atrium stable from the previous day. Cardiac shadow is mildly enlarged but  stable. Central vascular congestion is noted as well as patchy opacity in the right upper lobe stable from the prior exam. No bony abnormality is noted. IMPRESSION: Stable appearance of the chest when compared with the previous day. Electronically Signed   By: Inez Catalina M.D.   On: 02/23/2022 22:32   DG CHEST PORT 1 VIEW  Result Date: 02/22/2022 CLINICAL DATA:  Respiratory failure EXAM: PORTABLE CHEST 1 VIEW COMPARISON:  02/19/2022 FINDINGS: Cardiac silhouette is prominent. Increasing consolidation right mid lung consistent with pneumonia. Pulmonary vascular congestion likely pulmonary edema. Small left-sided pleural effusion. Right PICC tip distal SVC. IMPRESSION: Findings suggest CHF with possible superimposed pneumonia on the right. Electronically Signed   By: Sammie Bench M.D.   On: 02/22/2022 19:09   MR LUMBAR SPINE WO CONTRAST  Result Date: 02/22/2022 CLINICAL DATA:  Initial evaluation for low back pain, infection suspected, bacteremia. EXAM: MRI LUMBAR SPINE WITHOUT CONTRAST TECHNIQUE: Multiplanar, multisequence MR imaging of the lumbar spine was performed. No intravenous contrast was administered. COMPARISON:  None Available. FINDINGS: Segmentation: Standard. Lowest well-formed disc space labeled the L5-S1 level. Alignment: Straightening of the normal lumbar lordosis with slight focal kyphotic angulation at the T12-L1 level. No significant listhesis. Vertebrae: Mild chronic height loss present at the superior endplate of L1. Vertebral body height otherwise maintained with no acute or recent fracture. Bone marrow signal intensity within normal limits. No worrisome osseous lesions. Mild reactive endplate changes about the L5-S1 interspace favored to be degenerative in nature. No other abnormal marrow edema. No evidence for osteomyelitis discitis or septic arthritis. Conus medullaris and cauda equina: Conus extends to the L1 level. Conus and cauda equina appear normal. No epidural collections.  Paraspinal and other soft tissues: Mild diffuse edema within the posterior paraspinous soft tissues, favored to be related to overall volume status. No collections or significant inflammatory changes. Visualized visceral structures unremarkable. Disc levels: T12-L1: Broad-based right subarticular to foraminal disc protrusion mildly flattens the right ventral thecal sac. Mild facet hypertrophy. Resultant mild narrowing of the right lateral recess. Central canal remains patent. No foraminal stenosis. L1-2:  Negative interspace.  Mild facet hypertrophy.  No stenosis. L2-3: Negative interspace. Mild bilateral facet hypertrophy. No stenosis. L3-4: Mild diffuse disc bulge with disc desiccation. Associated reactive endplate spurring. Mild to moderate facet and ligament flavum hypertrophy with associated small joint effusions. Resultant mild-to-moderate canal with bilateral subarticular stenosis. Foramina remain patent. L4-5: Diffuse disc bulge with disc desiccation. Mild reactive endplate spurring, worse on the left. Mild to moderate facet and ligament flavum hypertrophy with associated trace joint effusion on the right. Resultant moderate canal with bilateral subarticular stenosis. Mild bilateral L4 foraminal narrowing. L5-S1: Mild disc bulge with superimposed tiny central disc protrusion. Mild to moderate facet hypertrophy. Resultant mild narrowing of the lateral recesses bilaterally. Central canal remains patent. No significant foraminal stenosis. IMPRESSION: 1. No MRI evidence for acute infection within the lumbar spine. 2. Multifactorial degenerative changes at L3-4 and L4-5 with resultant mild to moderate canal and bilateral subarticular stenosis, with mild bilateral L4 foraminal narrowing. 3. Small disc protrusions at T12-L1 and L5-S1 without significant stenosis or overt neural impingement as above. Electronically Signed   By: Jeannine Boga M.D.   On: 02/22/2022 18:19   [place] EKG SITE RITE  Result Date:  02/22/2022 If Site Rite image not attached, placement could not be confirmed due to current  cardiac rhythm.  CT CHEST WO CONTRAST  Result Date: 02/20/2022 CLINICAL DATA:  Abnormal chest x-ray. Neutropenic fever patient on chemotherapy EXAM: CT CHEST WITHOUT CONTRAST TECHNIQUE: Multidetector CT imaging of the chest was performed following the standard protocol without IV contrast. RADIATION DOSE REDUCTION: This exam was performed according to the departmental dose-optimization program which includes automated exposure control, adjustment of the mA and/or kV according to patient size and/or use of iterative reconstruction technique. COMPARISON:  Chest x-ray February 19, 2022. CT of the chest December 02, 2021. FINDINGS: Cardiovascular: Calcified atherosclerotic changes identified in the left coronary arteries. Heart size is unremarkable. The thoracic aorta demonstrates minimal calcified atherosclerotic change. Central pulmonary arteries are normal caliber. Mediastinum/Nodes: A nodule is identified in the superior aspect of the left breast on series 4, image 32 measuring 8 mm, not identified on the CT scan dated December 02, 2021. Postoperative changes are identified in the left breast. Chest wall is otherwise unremarkable. No pleural or pericardial effusions. The esophagus and visualized thyroid are normal. No adenopathy. Lungs/Pleura: Central airways are normal. No pneumothorax. Bilateral patchy infiltrates are identified in the lungs consistent with multifocal pneumonia. No cavitation or complication identified. Upper Abdomen: The liver demonstrates a cirrhotic morphology with a nodular contour. Musculoskeletal: No chest wall mass or suspicious bone lesions identified. IMPRESSION: 1. Multifocal infiltrate worrisome for pneumonia. Recommend follow-up imaging after treatment to ensure resolution. 2. 8 mm nodule in the superior aspect of the left breast, not identified on the CT scan dated December 02, 2021.  Recommend diagnostic mammography and ultrasound for further evaluation. 3. Calcified atherosclerotic change in the left coronary arteries. 4. Calcified atherosclerotic change in the thoracic aorta. 5. Cirrhotic morphology of the liver. Aortic Atherosclerosis (ICD10-I70.0). Electronically Signed   By: Dorise Bullion III M.D.   On: 02/20/2022 09:53   DG CHEST PORT 1 VIEW  Result Date: 02/19/2022 CLINICAL DATA:  Airspace disease on neck CT. EXAM: PORTABLE CHEST 1 VIEW COMPARISON:  02/22/2022 FINDINGS: 0803 hours. Scattered areas of ill-defined patchy and nodular airspace disease noted bilaterally. No edema or substantial effusion. The cardio pericardial silhouette is enlarged. Telemetry leads overlie the chest. IMPRESSION: Scattered areas of ill-defined patchy and nodular airspace disease bilaterally. Imaging features raise concern for multifocal pneumonia. Follow-up recommended to ensure resolution. Electronically Signed   By: Misty Stanley M.D.   On: 02/19/2022 08:40   CT SOFT TISSUE NECK W CONTRAST  Result Date: 02/19/2022 CLINICAL DATA:  67 year old female with left facial swelling and redness. Recent fall. EXAM: CT NECK WITH CONTRAST TECHNIQUE: Multidetector CT imaging of the neck was performed using the standard protocol following the bolus administration of intravenous contrast. RADIATION DOSE REDUCTION: This exam was performed according to the departmental dose-optimization program which includes automated exposure control, adjustment of the mA and/or kV according to patient size and/or use of iterative reconstruction technique. CONTRAST:  107m OMNIPAQUE IOHEXOL 350 MG/ML SOLN COMPARISON:  Head CT 02/18/2022. CTA chest 12/02/2021. FINDINGS: Pharynx and larynx: Glottis is closed. And the nasopharynx is effaced at the level of the soft palate. But otherwise laryngeal and pharyngeal soft tissue contours are within normal limits. Parapharyngeal and retropharyngeal spaces appear to remain normal. Salivary  glands: Negative sublingual space. Submandibular glands appear symmetric and within normal limits. The right parotid gland appears normal. But there is asymmetric heterogeneous increased density in the left parotid gland (series 3, image 43 and see coronal image 73) no discrete parotid mass is associated. Soft tissue at the left stylomastoid foramen appears to  remain normal. The deep lobe of the gland has a more normal appearance. There does appear to be mild overlying subcutaneous soft tissue stranding. No sialolithiasis.  Left parotid duct does not appear enlarged. Thyroid: Negative. Lymph nodes: No cervical lymphadenopathy. However, left level 2 lymph nodes are larger than those on the right (only 6 mm short axis) and might be reactive. Vascular: Suboptimal intravascular contrast bolus. But the major vascular structures in the neck and at the skull base appear to remain patent. Tortuous carotid arteries each with a partially retropharyngeal course. Bilateral carotid bifurcation calcified atherosclerosis. Limited intracranial: Negative. Visualized orbits: Negative. Mastoids and visualized paranasal sinuses: Visualized paranasal sinuses and mastoids are clear. Tympanic cavities are clear. Skeleton: Mandible intact and normally located. Cervical spine disc and endplate degeneration. No acute osseous abnormality identified. Medial, superior left breast soft tissue thickening and architectural distortion on series 3, image 131 appears stable since September, with other postoperative changes to the left breast visible at that time. Upper chest: Multifocal Patchy and confluent scattered bilateral upper lobe pulmonary opacity. This is new from the September CTA. Visible major airways remain patent. No pleural effusion identified. Calcified aortic atherosclerosis. Visible superior mediastinal lymph nodes remain within normal limits. IMPRESSION: 1. Asymmetric density and heterogeneity of the left parotid gland compatible  with Acute Parotitis, or possibly parotid injury in the setting of fall. No complicating features. 2. Multifocal bilateral upper lobe pulmonary opacity is new from a September CTA and appears infectious/inflammatory. Consider Bilateral Pneumonia including viral/atypical etiologies. Electronically Signed   By: Genevie Ann M.D.   On: 02/19/2022 06:09   ECHOCARDIOGRAM COMPLETE  Result Date: 02/18/2022    ECHOCARDIOGRAM REPORT   Patient Name:   ANNALEESE GUIER [facility] Date of Exam: 02/18/2022 Medical Rec #:  [id number]     Height:       62.0 in Accession #:    [id number]    Weight:       248.6 lb Date of Birth:  [birth year]    BSA:          2.097 m Patient Age:    86 years      BP:           103/37 mmHg Patient Gender: F             HR:           124 bpm. Exam Location:  Inpatient Procedure: 2D Echo, Cardiac Doppler and Color Doppler Indications:    Atrial Fibrillation I48.91  History:        Patient has prior history of Echocardiogram examinations, most                 recent 07/18/2021. Arrythmias:Atrial Fibrillation and                 Tachycardia; Risk Factors:Diabetes, Hypertension and Former                 Smoker.  Sonographer:    Greer Pickerel Referring Phys: [id number] [place] Shi  Sonographer Comments: Image acquisition challenging due to patient body habitus and Image acquisition challenging due to respiratory motion. IMPRESSIONS  1. Left ventricular ejection fraction, by estimation, is 50 to 55%. The left ventricle has low normal function. The left ventricle has no regional wall motion abnormalities. Left ventricular diastolic parameters are indeterminate.  2. Right ventricular systolic function is normal. The right ventricular size is normal. There is normal pulmonary artery systolic pressure.  3. The mitral valve was not well visualized.  Moderate mitral valve regurgitation.  4. The aortic valve is tricuspid. There is mild calcification of the aortic valve. There is mild thickening of the aortic valve. Aortic valve  regurgitation is not visualized.  5. The inferior vena cava is normal in size with greater than 50% respiratory variability, suggesting right atrial pressure of 3 mmHg. Comparison(s): Prior images reviewed side by side. Increase in mitral regurgitation. FINDINGS  Left Ventricle: Left ventricular ejection fraction, by estimation, is 50 to 55%. The left ventricle has low normal function. The left ventricle has no regional wall motion abnormalities. The left ventricular internal cavity size was normal in size. There is no left ventricular hypertrophy. Left ventricular diastolic parameters are indeterminate. Right Ventricle: The right ventricular size is normal. No increase in right ventricular wall thickness. Right ventricular systolic function is normal. There is normal pulmonary artery systolic pressure. The tricuspid regurgitant velocity is 1.54 m/s, and  with an assumed right atrial pressure of 3 mmHg, the estimated right ventricular systolic pressure is 00.3 mmHg. Left Atrium: Left atrial size was normal in size. Right Atrium: Right atrial size was normal in size. Pericardium: Trivial pericardial effusion is present. Presence of epicardial fat layer. Mitral Valve: The mitral valve was not well visualized. Moderate mitral valve regurgitation. Tricuspid Valve: The tricuspid valve is normal in structure. Tricuspid valve regurgitation is not demonstrated. No evidence of tricuspid stenosis. Aortic Valve: The aortic valve is tricuspid. There is mild calcification of the aortic valve. There is mild thickening of the aortic valve. Aortic valve regurgitation is not visualized. Pulmonic Valve: The pulmonic valve was grossly normal. Pulmonic valve regurgitation is not visualized. No evidence of pulmonic stenosis. Aorta: The aortic root and ascending aorta are structurally normal, with no evidence of dilitation. Venous: The inferior vena cava is normal in size with greater than 50% respiratory variability, suggesting right  atrial pressure of 3 mmHg. IAS/Shunts: No atrial level shunt detected by color flow Doppler.  LEFT VENTRICLE PLAX 2D LVIDd:         4.40 cm   Diastology LVIDs:         3.20 cm   LV e' medial:    7.72 cm/s LV PW:         1.30 cm   LV E/e' medial:  13.3 LV IVS:        1.00 cm   LV e' lateral:   11.30 cm/s LVOT diam:     1.60 cm   LV E/e' lateral: 9.1 LV SV:         26 LV SV Index:   12 LVOT Area:     2.01 cm  RIGHT VENTRICLE RV S prime:     12.40 cm/s TAPSE (M-mode): 1.3 cm LEFT ATRIUM             Index        RIGHT ATRIUM           Index LA diam:        4.30 cm 2.05 cm/m   RA Area:     17.40 cm LA Vol (A2C):   63.7 ml 30.38 ml/m  RA Volume:   47.90 ml  22.84 ml/m LA Vol (A4C):   54.3 ml 25.90 ml/m LA Biplane Vol: 62.2 ml 29.66 ml/m  AORTIC VALVE LVOT Vmax:   99.00 cm/s LVOT Vmean:  63.900 cm/s LVOT VTI:    0.127 m  AORTA Ao Root diam: 3.40 cm Ao Asc diam:  3.40 cm MITRAL VALVE  TRICUSPID VALVE MV Area (PHT): 9.60 cm     TR Peak grad:   9.5 mmHg MV Decel Time: 79 msec      TR Vmax:        154.00 cm/s MR Peak grad: 50.8 mmHg MR Vmax:      356.50 cm/s   SHUNTS MV E velocity: 103.00 cm/s  Systemic VTI:  0.13 m MV A velocity: 70.40 cm/s   Systemic Diam: 1.60 cm MV E/A ratio:  1.46 Rudean Haskell MD Electronically signed by Rudean Haskell MD Signature Date/Time: 02/18/2022/6:16:34 PM    Final    CT Head Wo Contrast  Result Date: 02/10/2022 CLINICAL DATA:  Trauma, fall EXAM: CT HEAD WITHOUT CONTRAST TECHNIQUE: Contiguous axial images were obtained from the base of the skull through the vertex without intravenous contrast. RADIATION DOSE REDUCTION: This exam was performed according to the departmental dose-optimization program which includes automated exposure control, adjustment of the mA and/or kV according to patient size and/or use of iterative reconstruction technique. COMPARISON:  None Available. FINDINGS: Brain: No acute intracranial findings are seen. There are no signs of  bleeding within the cranium. Ventricles are not dilated. Cortical sulci are prominent. There is no focal edema or mass effect. Vascular: Unremarkable. Skull: Unremarkable. Sinuses/Orbits: Unremarkable. Other: None. IMPRESSION: No acute intracranial findings are seen in noncontrast CT brain. Electronically Signed   By: Elmer Picker M.D.   On: 02/09/2022 10:58   DG Chest Port 1 View  Result Date: 02/27/2022 CLINICAL DATA:  Tachycardia and weakness EXAM: PORTABLE CHEST 1 VIEW COMPARISON:  12/07/2021 and prior studies FINDINGS: This is a low volume study. Cardiomegaly and LEFT PICC line with tip overlying the RIGHT atrium again noted. Defibrillator/pacing pads overlying the chest are noted. There is no evidence of focal airspace disease, pulmonary edema, suspicious pulmonary nodule/mass, pleural effusion, or pneumothorax. No acute bony abnormalities are identified. IMPRESSION: Cardiomegaly without evidence of acute cardiopulmonary disease. Electronically Signed   By: Margarette Canada M.D.   On: 02/04/2022 09:06    Microbiology Recent Results (from the past 240 hour(s))  Resp panel by RT-PCR (RSV, Flu A&B, Covid) Anterior Nasal Swab     Status: None   Collection Time: 02/22/2022  8:59 AM   Specimen: Anterior Nasal Swab  Result Value Ref Range Status   SARS Coronavirus 2 by RT PCR NEGATIVE NEGATIVE Final    Comment: (NOTE) SARS-CoV-2 target nucleic acids are NOT DETECTED.  The SARS-CoV-2 RNA is generally detectable in upper respiratory specimens during the acute phase of infection. The lowest concentration of SARS-CoV-2 viral copies this assay can detect is 138 copies/mL. A negative result does not preclude SARS-Cov-2 infection and should not be used as the sole basis for treatment or other patient management decisions. A negative result may occur with  improper specimen collection/handling, submission of specimen other than nasopharyngeal swab, presence of viral mutation(s) within the areas  targeted by this assay, and inadequate number of viral copies(<138 copies/mL). A negative result must be combined with clinical observations, patient history, and epidemiological information. The expected result is Negative.  Fact Sheet for Patients:  EntrepreneurPulse.com.au  Fact Sheet for Healthcare Providers:  IncredibleEmployment.be  This test is no t yet approved or cleared by the [place] FDA and  has been authorized for detection and/or diagnosis of SARS-CoV-2 by FDA under an Emergency Use Authorization (EUA). This EUA will remain  in effect (meaning this test can be used) for the duration of the COVID-19 declaration under Section 564(b)(1) of the  Act, 21 U.S.C.section 360bbb-3(b)(1), unless the authorization is terminated  or revoked sooner.       Influenza A by PCR NEGATIVE NEGATIVE Final   Influenza B by PCR NEGATIVE NEGATIVE Final    Comment: (NOTE) The Xpert Xpress SARS-CoV-2/FLU/RSV plus assay is intended as an aid in the diagnosis of influenza from Nasopharyngeal swab specimens and should not be used as a sole basis for treatment. Nasal washings and aspirates are unacceptable for Xpert Xpress SARS-CoV-2/FLU/RSV testing.  Fact Sheet for Patients: EntrepreneurPulse.com.au  Fact Sheet for Healthcare Providers: IncredibleEmployment.be  This test is not yet approved or cleared by the [place] FDA and has been authorized for detection and/or diagnosis of SARS-CoV-2 by FDA under an Emergency Use Authorization (EUA). This EUA will remain in effect (meaning this test can be used) for the duration of the COVID-19 declaration under Section 564(b)(1) of the Act, 21 U.S.C. section 360bbb-3(b)(1), unless the authorization is terminated or revoked.     Resp Syncytial Virus by PCR NEGATIVE NEGATIVE Final    Comment: (NOTE) Fact Sheet for  Patients: EntrepreneurPulse.com.au  Fact Sheet for Healthcare Providers: IncredibleEmployment.be  This test is not yet approved or cleared by the [place] FDA and has been authorized for detection and/or diagnosis of SARS-CoV-2 by FDA under an Emergency Use Authorization (EUA). This EUA will remain in effect (meaning this test can be used) for the duration of the COVID-19 declaration under Section 564(b)(1) of the Act, 21 U.S.C. section 360bbb-3(b)(1), unless the authorization is terminated or revoked.  Performed at [facility], [address] [place], [place] [address]   Blood culture (routine x 2)     Status: Abnormal   Collection Time: 02/02/2022  3:19 PM   Specimen: BLOOD  Result Value Ref Range Status   Specimen Description BLOOD SITE NOT SPECIFIED  Final   Special Requests   Final    BOTTLES DRAWN AEROBIC AND ANAEROBIC Blood Culture results may not be optimal due to an inadequate volume of blood received in culture bottles   Culture  Setup Time   Final    GRAM POSITIVE COCCI IN BOTH AEROBIC AND ANAEROBIC BOTTLES CRITICAL VALUE NOTED.  VALUE IS CONSISTENT WITH PREVIOUSLY REPORTED AND CALLED VALUE.    Culture (A)  Final    STAPHYLOCOCCUS AUREUS SUSCEPTIBILITIES PERFORMED ON PREVIOUS CULTURE WITHIN THE LAST 5 DAYS. Performed at [facility], [place] [address], [place], [place] [address]    Report Status 02/20/2022 FINAL  Final  Blood culture (routine x 2)     Status: Abnormal   Collection Time: 02/07/2022  3:24 PM   Specimen: BLOOD  Result Value Ref Range Status   Specimen Description BLOOD SITE NOT SPECIFIED  Final   Special Requests   Final    BOTTLES DRAWN AEROBIC AND ANAEROBIC Blood Culture adequate volume   Culture  Setup Time   Final    GRAM POSITIVE COCCI IN PAIRS IN CLUSTERS IN BOTH AEROBIC AND ANAEROBIC BOTTLES CRITICAL RESULT CALLED TO, READ BACK BY AND VERIFIED WITH: PHARMD [email or web address] AT 6578 ON 02/18/2022 BY  T.SAAD. Performed at [facility], [place] [address], [place], [place] [address]    Culture STAPHYLOCOCCUS AUREUS (A)  Final   Report Status 02/20/2022 FINAL  Final   Organism ID, Bacteria STAPHYLOCOCCUS AUREUS  Final      Susceptibility   Staphylococcus aureus - MIC*    CIPROFLOXACIN <=0.5 SENSITIVE Sensitive     ERYTHROMYCIN <=0.25 SENSITIVE Sensitive     GENTAMICIN <=0.5  SENSITIVE Sensitive     OXACILLIN <=0.25 SENSITIVE Sensitive     TETRACYCLINE <=1 SENSITIVE Sensitive     VANCOMYCIN 1 SENSITIVE Sensitive     TRIMETH/SULFA <=10 SENSITIVE Sensitive     CLINDAMYCIN <=0.25 SENSITIVE Sensitive     RIFAMPIN <=0.5 SENSITIVE Sensitive     Inducible Clindamycin NEGATIVE Sensitive     * STAPHYLOCOCCUS AUREUS  Blood Culture ID Panel (Reflexed)     Status: Abnormal   Collection Time: 02/04/2022  3:24 PM  Result Value Ref Range Status   Enterococcus faecalis NOT DETECTED NOT DETECTED Final   Enterococcus Faecium NOT DETECTED NOT DETECTED Final   Listeria monocytogenes NOT DETECTED NOT DETECTED Final   Staphylococcus species DETECTED (A) NOT DETECTED Final    Comment: CRITICAL RESULT CALLED TO, READ BACK BY AND VERIFIED WITH: PHARMD [email or web address] AT 4920 ON 02/18/2022 BY T.SAAD.    Staphylococcus aureus (BCID) DETECTED (A) NOT DETECTED Final    Comment: CRITICAL RESULT CALLED TO, READ BACK BY AND VERIFIED WITH: PHARMD [email or web address] AT 1007 ON 02/18/2022 BY T.SAAD.    Staphylococcus epidermidis NOT DETECTED NOT DETECTED Final   Staphylococcus lugdunensis NOT DETECTED NOT DETECTED Final   Streptococcus species NOT DETECTED NOT DETECTED Final   Streptococcus agalactiae NOT DETECTED NOT DETECTED Final   Streptococcus pneumoniae NOT DETECTED NOT DETECTED Final   Streptococcus pyogenes NOT DETECTED NOT DETECTED Final   A.calcoaceticus-baumannii NOT DETECTED NOT DETECTED Final   Bacteroides fragilis NOT DETECTED NOT DETECTED Final   Enterobacterales NOT DETECTED NOT DETECTED Final   Enterobacter  cloacae complex NOT DETECTED NOT DETECTED Final   Escherichia coli NOT DETECTED NOT DETECTED Final   Klebsiella aerogenes NOT DETECTED NOT DETECTED Final   Klebsiella oxytoca NOT DETECTED NOT DETECTED Final   Klebsiella pneumoniae NOT DETECTED NOT DETECTED Final   Proteus species NOT DETECTED NOT DETECTED Final   Salmonella species NOT DETECTED NOT DETECTED Final   Serratia marcescens NOT DETECTED NOT DETECTED Final   Haemophilus influenzae NOT DETECTED NOT DETECTED Final   Neisseria meningitidis NOT DETECTED NOT DETECTED Final   Pseudomonas aeruginosa NOT DETECTED NOT DETECTED Final   Stenotrophomonas maltophilia NOT DETECTED NOT DETECTED Final   Candida albicans NOT DETECTED NOT DETECTED Final   Candida auris NOT DETECTED NOT DETECTED Final   Candida glabrata NOT DETECTED NOT DETECTED Final   Candida krusei NOT DETECTED NOT DETECTED Final   Candida parapsilosis NOT DETECTED NOT DETECTED Final   Candida tropicalis NOT DETECTED NOT DETECTED Final   Cryptococcus neoformans/gattii NOT DETECTED NOT DETECTED Final   Meth resistant mecA/C and MREJ NOT DETECTED NOT DETECTED Final    Comment: Performed at [facility], [address], [place], [place] [address]  Culture, body fluid w Gram Stain-bottle     Status: None   Collection Time: 02/06/2022  6:25 PM   Specimen: Fluid  Result Value Ref Range Status   Specimen Description FLUID  Final   Special Requests   Final    BOTTLES DRAWN AEROBIC AND ANAEROBIC Blood Culture results may not be optimal due to an excessive volume of blood received in culture bottles   Culture   Final    NO GROWTH 5 DAYS Performed at [facility], [place] [address], [place], [place] [address]    Report Status 02/22/2022 FINAL  Final  Gram stain     Status: None   Collection Time: 02/20/2022  6:26 PM   Specimen: Fluid  Result Value Ref Range Status   Specimen  Description FLUID  Final   Special Requests NONE  Final   Gram Stain   Final    NO ORGANISMS  SEEN NO WBC SEEN Performed at [facility], [address], [place], [place] [address]    Report Status 02/09/2022 FINAL  Final  Respiratory (~20 pathogens) panel by PCR     Status: None   Collection Time: 02/18/22 10:58 AM   Specimen: Nasopharyngeal Swab; Respiratory  Result Value Ref Range Status   Adenovirus NOT DETECTED NOT DETECTED Final   Coronavirus 229E NOT DETECTED NOT DETECTED Final    Comment: (NOTE) The Coronavirus on the Respiratory Panel, DOES NOT test for the novel  Coronavirus (2019 nCoV)    Coronavirus HKU1 NOT DETECTED NOT DETECTED Final   Coronavirus NL63 NOT DETECTED NOT DETECTED Final   Coronavirus OC43 NOT DETECTED NOT DETECTED Final   Metapneumovirus NOT DETECTED NOT DETECTED Final   Rhinovirus / Enterovirus NOT DETECTED NOT DETECTED Final   Influenza A NOT DETECTED NOT DETECTED Final   Influenza B NOT DETECTED NOT DETECTED Final   Parainfluenza Virus 1 NOT DETECTED NOT DETECTED Final   Parainfluenza Virus 2 NOT DETECTED NOT DETECTED Final   Parainfluenza Virus 3 NOT DETECTED NOT DETECTED Final   Parainfluenza Virus 4 NOT DETECTED NOT DETECTED Final   Respiratory Syncytial Virus NOT DETECTED NOT DETECTED Final   Bordetella pertussis NOT DETECTED NOT DETECTED Final   Bordetella Parapertussis NOT DETECTED NOT DETECTED Final   Chlamydophila pneumoniae NOT DETECTED NOT DETECTED Final   Mycoplasma pneumoniae NOT DETECTED NOT DETECTED Final    Comment: Performed at [facility], [place]. [address], [place], [place] [address]  Culture, blood (Routine X 2) w Reflex to ID Panel     Status: None   Collection Time: 02/19/22  5:54 AM   Specimen: BLOOD  Result Value Ref Range Status   Specimen Description BLOOD BLOOD LEFT ARM  Final   Special Requests   Final    BOTTLES DRAWN AEROBIC ONLY Blood Culture adequate volume   Culture   Final    NO GROWTH 5 DAYS Performed at [facility], [place] [address] [place],  [address]    Report Status  03-07-2022 FINAL  Final  Culture, blood (Routine X 2) w Reflex to ID Panel     Status: None   Collection Time: 02/19/22  5:54 AM   Specimen: BLOOD  Result Value Ref Range Status   Specimen Description BLOOD BLOOD LEFT HAND  Final   Special Requests   Final    BOTTLES DRAWN AEROBIC ONLY Blood Culture adequate volume   Culture   Final    NO GROWTH 5 DAYS Performed at [facility], [place] [address] [place],  [address]    Report Status 07-Mar-2022 FINAL  Final  Blastomyces Antigen     Status: None   Collection Time: 02/20/22  2:11 AM   Specimen: Blood  Result Value Ref Range Status   Blastomyces Antigen None Detected None Detected ng/mL Final    Comment: (NOTE) Results reported as ng/mL in 0.2 - 14.7 ng/mL range Results above the limit of detection but below 0.2 ng/mL are reported as 'Positive, Below the Limit of Quantification' Results above 14.7 ng/mL are reported as 'Positive, Above the Limit of Quantification'    Specimen Type SERUM  Final    Comment: (NOTE) Performed At: [facility], [place] [id number] Bruce Donath MD [phone]  Aspergillus Ag, BAL/Serum     Status: None   Collection Time: 02/21/22  5:37 AM   Specimen: SERUM; Blood  Result Value Ref Range Status   Aspergillus Ag, BAL/Serum 0.04 0.00 - 0.49 Index Final    Comment: (NOTE) Performed At: [facility] [address] [place], [place] [id number] Rush Farmer MD [id number]   MRSA Next Gen by PCR, Nasal     Status: None   Collection Time: 02/22/22  7:42 PM   Specimen: Nasal Mucosa; Nasal Swab  Result Value Ref Range Status   MRSA by PCR Next Gen NOT DETECTED NOT DETECTED Final    Comment: (NOTE) The GeneXpert MRSA Assay (FDA approved for NASAL specimens only), is one component of a comprehensive MRSA colonization surveillance program. It is not intended to diagnose MRSA infection nor to guide or monitor treatment for MRSA  infections. Test performance is not FDA approved in patients less than 39 years old. Performed at [facility], [place] [address] [place], [place] [address]   Culture, Respiratory w Gram Stain     Status: None (Preliminary result)   Collection Time: 2022-03-04 12:10 PM   Specimen: Bronchoalveolar Lavage; Respiratory  Result Value Ref Range Status   Specimen Description BRONCHIAL ALVEOLAR LAVAGE  Final   Special Requests Immunocompromised  Final   Gram Stain   Final    NO ORGANISMS SEEN NO WBC SEEN Performed at [facility], [address], [place],  [address]    Culture PENDING  Incomplete   Report Status PENDING  Incomplete    Lab Basic Metabolic Panel: Recent Labs  Lab 02/19/22 0347 02/20/22 0211 02/21/22 1020 02/22/22 0234 02/22/22 1926 02/22/22 2355 02/23/22 0545 Mar 04, 2022 0242 2022-03-04 1039 03-04-2022 1441  NA 130* 134* 137 135 136 136 137 134* 134* 133*  K 3.2* 3.9 2.8* 3.6 4.2 4.0 3.7 2.6* 2.9* 4.0  CL 101 106 109 106 105  --  104 103  --   --   CO2 20* 17* 19* 20* 20*  --  21* 19*  --   --   GLUCOSE 108* 116* 157* 103* 109*  --  136* 221*  --   --   BUN _0 24*  --  27* 35*  --   --   CREATININE 1.30* 1.20* 1.02* 1.15* 1.30*  --  1.35* 1.54*  --   --   CALCIUM 7.6* 7.9* 7.5* 8.1* 8.9  --  8.7* 7.8*  --   --   MG 1.6* 2.0  --  1.8 1.9  --  1.9 2.3  --   --   PHOS 3.1 2.3*  --   --  3.8  --   --   --   --   --    Liver Function Tests: Recent Labs  Lab 02/19/22 0347 02/20/22 0211 02/22/22 1926  AST 31 24 13*  ALT 23 13 <5  ALKPHOS 40 49 71  BILITOT 1.8* 2.2* 1.3*  PROT 5.4* 5.2* 5.6*  ALBUMIN 2.1* 1.9* 1.7*   No results for input(s): "LIPASE", "AMYLASE" in the last 168 hours. No results for input(s): "AMMONIA" in the last 168 hours. CBC: Recent Labs  Lab 02/18/22 0430 02/18/22 1035 02/22/22 0234 02/22/22 1926 02/22/22 2355 02/23/22 0545 02/23/22 1537 Mar 04, 2022 0242 03/04/2022 1039 03/04/2022 1441  WBC DUPL   < > 0.2* <0.1*   --  0.1* 0.1* <0.1*  --   --   NEUTROABS PENDING  --  0.0*  --   --   --  0.0*  --   --   --   HGB DUPL   < > 8.8* 9.3*   < > 9.1* 10.0* 7.7* 7.8* 8.2*  HCT DUPL   < > 23.6* 26.3*   < > 25.0* 27.0* 21.1* 23.0* 24.0*  MCV DUPL   < > 84.0 86.2  --  85.6 83.9 85.1  --   --   PLT DUPL   < > 13* 17*  --  12* 18* 9*  --   --    < > = values in this interval not displayed.   Cardiac Enzymes: No results for input(s): "CKTOTAL", "CKMB", "CKMBINDEX", "TROPONINI" in the last 168 hours. Sepsis Labs: Recent Labs  Lab 02/22/22 1926 02/23/22 0545 02/23/22 1537 03/16/2022 0242 March 16, 2022 0243 03/16/22 0750  WBC <0.1* 0.1* 0.1* <0.1*  --   --   LATICACIDVEN  --   --   --   --  1.6 1.9    Procedures/Operations  Intubation Aline PICC Bronchoscopy with BAL    Julian Hy 03-16-2022, 8:00 PM

## 2022-03-05 NOTE — Significant Event (Addendum)
Rapid Response Event Note   Reason for Call :  Low BP, increased O2 needs  Initial Focused Assessment:  Lying in bed, taking shallow breaths, use of accessory muscles. A&O x4 with eyes closed and falls back to sleep quickly. Lung sounds rhonchus and wet.   VS 71/48 (54) HR 113 RR 36 O2 95% HHFNC 40L 90%  Temp 96.9 ax  Interventions:  CXR  Plan of Care:  Transfer to ICU  Event Summary:  MD Notified: A. Ander Slade MD Call Time: [phone] Arrival Time: 0830 End Time: 0910   ADDENDUM [id number] Interventions Start Neo gtt on 4E until [place] bed ready Give ordered 2g mag IVPB Start K IVPB 1/5 runs Transferred to 2H15 with RT and Charge RN Rob Hickman, Girtha Rm, RN

## 2022-03-05 NOTE — Progress Notes (Signed)
Cardiology Progress Note  Patient ID: Kaitlyn Hull MRN: [id number] DOB: 1955-12-14 Date of Encounter: 03/24/22  Primary Cardiologist: Carlyle Dolly, MD  Subjective   Chief Complaint: SOB  HPI: Hypotensive 48s over 42s.  Worsening shortness of breath.  Chest x-ray shows diffuse pulmonary infiltrates.    ROS:  All other ROS reviewed and negative. Pertinent positives noted in the HPI.     Inpatient Medications  Scheduled Meds:  sodium chloride   Intravenous Once   sodium chloride   Intravenous Once   Chlorhexidine Gluconate Cloth  6 each Topical Daily   feeding supplement (GLUCERNA SHAKE)  237 mL Oral BID BM   insulin aspart  0-15 Units Subcutaneous TID WC   magnesium oxide  400 mg Oral BID   multivitamin with minerals  1 tablet Oral Daily   mouth rinse  15 mL Mouth Rinse 4 times per day   pravastatin  40 mg Oral q1800   senna-docusate  1 tablet Oral BID   sodium chloride flush  10-40 mL Intracatheter Q12H   Continuous Infusions:  amiodarone 60 mg/hr (03-24-2022 0510)    ceFAZolin (ANCEF) IV 2 g (24-Mar-2022 0438)   magnesium sulfate bolus IVPB     methocarbamol (ROBAXIN) IV 500 mg (02/22/22 0731)   phenylephrine (NEO-SYNEPHRINE) Adult infusion     potassium chloride     sodium chloride     PRN Meds: [EXPIRED] acetaminophen **FOLLOWED BY** acetaminophen, albuterol, HYDROmorphone (DILAUDID) injection, LORazepam, methocarbamol (ROBAXIN) IV, ondansetron **OR** ondansetron (ZOFRAN) IV, mouth rinse, oxyCODONE, sodium chloride, sodium chloride flush   Vital Signs   Vitals:   March 24, 2022 0740 03/24/2022 0818 2022/03/24 0840 Mar 24, 2022 0845  BP: (!) 154/61 (!) 71/58 (!) 64/45 (!) 71/48  Pulse: (!) 113 (!) 111 (!) 33 67  Resp: (!) 30  (!) 36   Temp:    (!) 96.9 F (36.1 C)  TempSrc:    Axillary  SpO2: 91% 100% 93% 95%  Weight:      Height:        Intake/Output Summary (Last 24 hours) at Mar 24, 2022 0935 Last data filed at March 24, 2022 0600 Gross per 24 hour  Intake 1416.08 ml   Output 700 ml  Net 716.08 ml      02/23/2022    4:24 AM 02/04/2022   11:56 PM 02/05/2022    8:42 AM  Last 3 Weights  Weight (lbs) 269 lb 2.9 oz 248 lb 9.6 oz 258 lb  Weight (kg) 122.1 kg 112.764 kg 117.028 kg      Telemetry  Overnight telemetry shows atrial fibrillation heart rates in the 100s, which I personally reviewed.   Physical Exam   Vitals:   03-24-2022 0740 03-24-2022 0818 March 24, 2022 0840 03-24-2022 0845  BP: (!) 154/61 (!) 71/58 (!) 64/45 (!) 71/48  Pulse: (!) 113 (!) 111 (!) 33 67  Resp: (!) 30  (!) 36   Temp:    (!) 96.9 F (36.1 C)  TempSrc:    Axillary  SpO2: 91% 100% 93% 95%  Weight:      Height:        Intake/Output Summary (Last 24 hours) at 2022/03/24 0935 Last data filed at March 24, 2022 0600 Gross per 24 hour  Intake 1416.08 ml  Output 700 ml  Net 716.08 ml       02/23/2022    4:24 AM 02/15/2022   11:56 PM 02/16/2022    8:42 AM  Last 3 Weights  Weight (lbs) 269 lb 2.9 oz 248 lb 9.6 oz 258 lb  Weight (  kg) 122.1 kg 112.764 kg 117.028 kg    Body mass index is 49.23 kg/m.  General: Ill-appearing, tachypnea noted Head: Atraumatic, normal size  Eyes: PEERLA, EOMI  Neck: Supple, no JVD Endocrine: No thryomegaly Cardiac: Normal S1, S2; irregular rhythm, no murmurs Lungs: Diminished breath sounds bilaterally Abd: Soft, nontender, no hepatomegaly  Ext: No edema, pulses 2+ Musculoskeletal: No deformities, BUE and BLE strength normal and equal Skin: Warm and dry, no rashes   Neuro: Awake alert, drowsy  Labs  High Sensitivity Troponin:  No results for input(s): "TROPONINIHS" in the last 720 hours.   Cardiac EnzymesNo results for input(s): "TROPONINI" in the last 168 hours. No results for input(s): "TROPIPOC" in the last 168 hours.  Chemistry Recent Labs  Lab 02/19/22 0347 02/20/22 0211 02/21/22 1020 02/22/22 1926 02/22/22 2355 02/23/22 0545 03/10/2022 0242  NA 130* 134*   < > 136 136 137 134*  K 3.2* 3.9   < > 4.2 4.0 3.7 2.6*  CL 101 106   < >  105  --  104 103  CO2 20* 17*   < > 20*  --  21* 19*  GLUCOSE 108* 116*   < > 109*  --  136* 221*  BUN 21 19   < > 24*  --  27* 35*  CREATININE 1.30* 1.20*   < > 1.30*  --  1.35* 1.54*  CALCIUM 7.6* 7.9*   < > 8.9  --  8.7* 7.8*  PROT 5.4* 5.2*  --  5.6*  --   --   --   ALBUMIN 2.1* 1.9*  --  1.7*  --   --   --   AST 31 24  --  13*  --   --   --   ALT 23 13  --  <5  --   --   --   ALKPHOS 40 49  --  71  --   --   --   BILITOT 1.8* 2.2*  --  1.3*  --   --   --   GFRNONAA 46* 50*   < > 46*  --  44* 37*  ANIONGAP 9 11   < > 11  --  12 12   < > = values in this interval not displayed.    Hematology Recent Labs  Lab 02/23/22 0545 02/23/22 1537 March 10, 2022 0242  WBC 0.1* 0.1* <0.1*  RBC 2.92* 3.22* 2.48*  HGB 9.1* 10.0* 7.7*  HCT 25.0* 27.0* 21.1*  MCV 85.6 83.9 85.1  MCH 31.2 31.1 31.0  MCHC 36.4* 37.0* 36.5*  RDW 18.6* 18.7* 18.7*  PLT 12* 18* 9*   BNP Recent Labs  Lab 02/19/22 0347 02/21/22 1020  BNP 499.1* 791.8*    DDimer No results for input(s): "DDIMER" in the last 168 hours.   Radiology  DG CHEST PORT 1 VIEW  Result Date: 02/23/2022 CLINICAL DATA:  Dyspnea EXAM: PORTABLE CHEST 1 VIEW COMPARISON:  02/22/2022 FINDINGS: Right-sided PICC is noted in the right atrium stable from the previous day. Cardiac shadow is mildly enlarged but stable. Central vascular congestion is noted as well as patchy opacity in the right upper lobe stable from the prior exam. No bony abnormality is noted. IMPRESSION: Stable appearance of the chest when compared with the previous day. Electronically Signed   By: Inez Catalina M.D.   On: 02/23/2022 22:32   DG CHEST PORT 1 VIEW  Result Date: 02/22/2022 CLINICAL DATA:  Respiratory failure EXAM: PORTABLE CHEST 1 VIEW COMPARISON:  02/19/2022 FINDINGS: Cardiac silhouette is prominent. Increasing consolidation right mid lung consistent with pneumonia. Pulmonary vascular congestion likely pulmonary edema. Small left-sided pleural effusion. Right PICC tip  distal SVC. IMPRESSION: Findings suggest CHF with possible superimposed pneumonia on the right. Electronically Signed   By: Sammie Bench M.D.   On: 02/22/2022 19:09   MR LUMBAR SPINE WO CONTRAST  Result Date: 02/22/2022 CLINICAL DATA:  Initial evaluation for low back pain, infection suspected, bacteremia. EXAM: MRI LUMBAR SPINE WITHOUT CONTRAST TECHNIQUE: Multiplanar, multisequence MR imaging of the lumbar spine was performed. No intravenous contrast was administered. COMPARISON:  None Available. FINDINGS: Segmentation: Standard. Lowest well-formed disc space labeled the L5-S1 level. Alignment: Straightening of the normal lumbar lordosis with slight focal kyphotic angulation at the T12-L1 level. No significant listhesis. Vertebrae: Mild chronic height loss present at the superior endplate of L1. Vertebral body height otherwise maintained with no acute or recent fracture. Bone marrow signal intensity within normal limits. No worrisome osseous lesions. Mild reactive endplate changes about the L5-S1 interspace favored to be degenerative in nature. No other abnormal marrow edema. No evidence for osteomyelitis discitis or septic arthritis. Conus medullaris and cauda equina: Conus extends to the L1 level. Conus and cauda equina appear normal. No epidural collections. Paraspinal and other soft tissues: Mild diffuse edema within the posterior paraspinous soft tissues, favored to be related to overall volume status. No collections or significant inflammatory changes. Visualized visceral structures unremarkable. Disc levels: T12-L1: Broad-based right subarticular to foraminal disc protrusion mildly flattens the right ventral thecal sac. Mild facet hypertrophy. Resultant mild narrowing of the right lateral recess. Central canal remains patent. No foraminal stenosis. L1-2:  Negative interspace.  Mild facet hypertrophy.  No stenosis. L2-3: Negative interspace. Mild bilateral facet hypertrophy. No stenosis. L3-4: Mild  diffuse disc bulge with disc desiccation. Associated reactive endplate spurring. Mild to moderate facet and ligament flavum hypertrophy with associated small joint effusions. Resultant mild-to-moderate canal with bilateral subarticular stenosis. Foramina remain patent. L4-5: Diffuse disc bulge with disc desiccation. Mild reactive endplate spurring, worse on the left. Mild to moderate facet and ligament flavum hypertrophy with associated trace joint effusion on the right. Resultant moderate canal with bilateral subarticular stenosis. Mild bilateral L4 foraminal narrowing. L5-S1: Mild disc bulge with superimposed tiny central disc protrusion. Mild to moderate facet hypertrophy. Resultant mild narrowing of the lateral recesses bilaterally. Central canal remains patent. No significant foraminal stenosis. IMPRESSION: 1. No MRI evidence for acute infection within the lumbar spine. 2. Multifactorial degenerative changes at L3-4 and L4-5 with resultant mild to moderate canal and bilateral subarticular stenosis, with mild bilateral L4 foraminal narrowing. 3. Small disc protrusions at T12-L1 and L5-S1 without significant stenosis or overt neural impingement as above. Electronically Signed   By: Jeannine Boga M.D.   On: 02/22/2022 18:19   [place] EKG SITE RITE  Result Date: 02/22/2022 If Site Rite image not attached, placement could not be confirmed due to current cardiac rhythm.   Cardiac Studies  TTE 02/18/2022  1. Left ventricular ejection fraction, by estimation, is 50 to 55%. The  left ventricle has low normal function. The left ventricle has no regional  wall motion abnormalities. Left ventricular diastolic parameters are  indeterminate.   2. Right ventricular systolic function is normal. The right ventricular  size is normal. There is normal pulmonary artery systolic pressure.   3. The mitral valve was not well visualized. Moderate mitral valve  regurgitation.   4. The aortic valve is tricuspid. There  is mild calcification  of the  aortic valve. There is mild thickening of the aortic valve. Aortic valve  regurgitation is not visualized.   5. The inferior vena cava is normal in size with greater than 50%  respiratory variability, suggesting right atrial pressure of 3 mmHg.   Patient Profile  AMORAH SEBRING is a 67 y.o. female with AML, permanent atrial fibrillation, diabetes, hypertension who was admitted on 02/20/2022 with pneumonia and MSSA bacteremia.  Cardiology has been consulted for A-fib with RVR.  Assessment & Plan   # Permanent atrial fibrillation -She has a known history of permanent atrial fibrillation.  She is now admitted to the hospital with pneumonia and bacteremia.   -Course also complicated by severe neutropenia and thrombocytopenia. -No anticoagulation in setting of severe thrombocytopenia.  Platelet transfusion per critical care medicine. -Her only option for rate control is amiodarone.  She is too hypotensive.  Her creatinine is rising.  I am stopping her digoxin. -She has been in prone atrial fibrillation.  Her A-fib with RVR is a stress response.  Would recommend against cardioversion.  She will not maintain sinus rhythm.  Again she has been in permanent atrial fibrillation and her RVR is secondary.  # Acute hypoxic respiratory failure # Septic shock # AML with neutropenia # MSSA bacteremia # Multilobar pneumonia -She does not appear volume overloaded.  Would recommend against further diuresis.  She is being transferred to the ICU for further management.  Suspect she will end up on pressors. -Her x-ray is more consistent with pneumonia to me.  I do not believe this is related to heart failure.  Her ejection fraction is normal.  She is euvolemic on examination. -She may benefit from chest CT. -Antibiotics per critical care medicine team.    For questions or updates, please contact [place] Please consult [email or web address] for contact info under    Signed, [place] T. Audie Box, MD, [place]  March 08, 2022 9:35 AM

## 2022-03-05 NOTE — Procedures (Signed)
Arterial Line Insertion Start/End2024-01-15 11:00 AM, March 19, 2022 11:05 AM  Patient location: ICU. Preanesthetic checklist: patient identified, site marked, risks and benefits discussed, monitors and equipment checked and timeout performed Left, radial was placed Catheter size: 20 G Hand hygiene performed , maximum sterile barriers used  and Seldinger technique used Allen's test indicative of satisfactory collateral circulation Attempts: 3 Procedure performed without using ultrasound guided technique. Following insertion, dressing applied and Biopatch. Post procedure assessment: normal  Post procedure complications: unsuccessful attempts. Patient tolerated the procedure well with no immediate complications.  Alarms Checked

## 2022-03-05 NOTE — Progress Notes (Signed)
Patient extubated per withdrawal order. Family at bedside.

## 2022-03-05 NOTE — Progress Notes (Signed)
Called to bedside to evaluate Ms. Kaitlyn Hull. Pulse ox on her finger int he 90s on HHF, but confused and sleepy. Likely needs bipap for hypercapnia. Getting ABG, Aline for hypotension. Not refractory hypoxia right now, so will hold off on intubation to review her chart and her care with Dr. Ander Slade.  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 Onc last saw 12/19. Their note does not indicate her AML prognosis, but overall she appears to not be responding well to aggressive care this admission with now recurrent hypotension and respiratory failure.  Afib is permanent- not a good candidate for DCCV per cardiology    ABG    Component Value Date/Time   PHART 7.378 02/22/2022 2355   PCO2ART 32.8 02/22/2022 2355   PO2ART 157 (H) 02/22/2022 2355   HCO3 19.0 (L) 03-05-2022 0205   TCO2 20 (L) 02/22/2022 2355   ACIDBASEDEF 6.2 (H) 03/05/22 0205   O2SAT 69.5 2022-03-05 0205   Sats inaccurate. With such asymmetric disease, this is more likely pneumonia than anything else. Worry about long-term prognosis with persistent neutropenia and progressive infection on antibiotics, but intubation may be required if she wants to remain full code. Does not appear that palliative care has been involved in her care.   Kaitlyn Hy, DO 2022-03-05 10:42 AM [facility]  For contact information, see Amion. If no response to pager, please call PCCM consult pager. After hours, 7PM- 7AM, please call Elink.

## 2022-03-05 NOTE — Progress Notes (Signed)
RT assisted with patient transport on HFNC from 4E09 to 7M22 without complications.

## 2022-03-05 NOTE — Progress Notes (Signed)
BP 71/58, HR 111. MD notified. Rapid response notified. Charge nurse notified.   2022-03-07 0818  Vitals  BP (!) 71/58  MAP (mmHg) (!) 62  BP Location Right Leg  BP Method Automatic  Patient Position (if appropriate) Lying  Pulse Rate (!) 111  Pulse Rate Source Monitor  ECG Heart Rate (!) 111  MEWS COLOR  MEWS Score Color Red  Oxygen Therapy  SpO2 100 %  O2 Device HFNC  O2 Flow Rate (L/min) 40 L/min  MEWS Score  MEWS Temp 1  MEWS Systolic 2  MEWS Pulse 2  MEWS RR 2  MEWS LOC 1  MEWS Score 8

## 2022-03-05 NOTE — Procedures (Signed)
Bronchoscopy Procedure Note  Kaitlyn Hull  [id number]  1955/11/24  Date:March 16, 2022  Time:12:22 PM   Provider Performing:Prathik Aman P Carlis Abbott   Procedure(s):  Flexible bronchoscopy with bronchial alveolar lavage [phone])  Indication(s) Neutropenia, HAP, severe respiratory failure, septic shock  Consent Risks of the procedure as well as the alternatives and risks of each were explained to the patient and/or caregiver.  Consent for the procedure was obtained and is signed in the bedside chart. Daughter provided consent via phone.  Anesthesia Per MAR   Time Out Verified patient identification, verified procedure, site/side was marked, verified correct patient position, special equipment/implants available, medications/allergies/relevant history reviewed, required imaging and test results available.   Sterile Technique Usual hand hygiene, masks, gowns, and gloves were used   Procedure Description Bronchoscope advanced through endotracheal tube and into airway.  Airways were examined down to subsegmental level with findings noted below.  Diffusely erythematous and edematous but no bleeding or purulent secretions Following diagnostic evaluation, BAL(s) performed in lingula with normal saline and return of ~20cc fluid. sErial aliquots were performed without significant clearing of bloody fluid-- all 3 remains persistently bloody with bright blood.  Findings: see above   Complications/Tolerance None; patient tolerated the procedure well. Chest X-ray is not needed post procedure.   EBL Ongoing alveolar bleeding   Specimen(s) BAL sent for KOH prep, fungal culture, routine culture, AFB, fungitell (zosyn not yet started), PJP.  Julian Hy, DO 2022-03-16 12:24 PM [facility]

## 2022-03-05 NOTE — Progress Notes (Signed)
PT VS below. 81% HHF [place] 10L/min. Respiratory notified.   2022-03-08 0720  Vitals  Temp (!) 96.7 F (35.9 C)  Temp Source Axillary  BP (!) 154/61  MAP (mmHg) 87  BP Location Right Leg  BP Method Automatic  Patient Position (if appropriate) Lying  Pulse Rate (!) 108  Pulse Rate Source Monitor  ECG Heart Rate (!) 111  Resp (!) 30  Level of Consciousness  Level of Consciousness Alert  MEWS COLOR  MEWS Score Color Red  Oxygen Therapy  SpO2 (!) 81 %  O2 Device HFNC  O2 Flow Rate (L/min) 10 L/min  PCA/Epidural/Spinal Assessment  Respiratory Pattern Labored  Glasgow Coma Scale  Eye Opening 4  Best Verbal Response (NON-intubated) 5  Best Motor Response 6  Glasgow Coma Scale Score 15  MEWS Score  MEWS Temp 1  MEWS Systolic 0  MEWS Pulse 2  MEWS RR 2  MEWS LOC 0  MEWS Score 5

## 2022-03-05 NOTE — Progress Notes (Signed)
Venous Gas obtained per RN Ndeye adv Dr. Verlee Monte didn't advise to make any additional changes. Pt stable and tolerating HFNC 35L/45% will continue to monitor

## 2022-03-05 NOTE — IPAL (Addendum)
  Interdisciplinary Goals of Care Family Meeting   Date carried out: 03-16-22  Location of the meeting: Phone conference  Member's involved: Physician, Bedside Registered Nurse, and Family Member or next of kin  Durable Power of Attorney or acting medical decision maker: daughter- Abby    Discussion: We discussed goals of care for Kaitlyn Hull .  Ms. Standiford is too confused right now to make her own informed decisions, so I discussed her care and need for intubation and bronchoscopy for BAL with her daughter. I let her know my concerns for progressive decline despite hospitalization for the past week. She has AML resulting from previous chemotherapy and failure of her BM to recover this admission. I am very concerned about her long-term prognosis. Abby is struggling with making decisions for her mother and wants a little time to consider if we should progress with intubation at this time. We discussed that we have about an hour to make decisions, but whatever she decides we will continue to care for her mother. She has the option of not continuing aggressive care measures and pursuing care focusing aggressively on her comfort. She will call back sometime in the next hour to discuss further.  Code status:   Code Status: Full Code   Disposition: Continue current acute care  Time spent for the meeting: 15 min.    Julian Hy, DO  March 16, 2022, 11:21 AM    Daughter called back and wants her to receive all aggressive care. We will proceed with intubation and bronch for additional cultures.   Julian Hy, DO 03-16-22 12:26 PM [facility]

## 2022-03-05 DEATH — deceased

## 2022-03-22 LAB — HISTOPLASMA ANTIGEN, URINE: Histoplasma Antigen, urine: NEGATIVE (ref <0.5)

## 2022-03-30 LAB — FUNGUS CULTURE RESULT

## 2022-03-30 LAB — FUNGUS CULTURE WITH STAIN

## 2022-03-30 LAB — FUNGAL ORGANISM REFLEX

## 2022-04-02 ENCOUNTER — Ambulatory Visit: Payer: 59 | Admitting: Cardiology

## 2022-04-14 LAB — ACID FAST CULTURE WITH REFLEXED SENSITIVITIES (MYCOBACTERIA): Acid Fast Culture: NEGATIVE

## 2022-12-01 ENCOUNTER — Ambulatory Visit: Payer: 59
